# Patient Record
Sex: Male | Born: 1970 | Race: White | Hispanic: No | Smoking: Current every day smoker
Health system: Southern US, Community
[De-identification: ages and names within clinical notes are randomized; demographics above are authoritative.]

## PROBLEM LIST (undated history)

## (undated) DIAGNOSIS — T783XXA Angioneurotic edema, initial encounter: Secondary | ICD-10-CM

## (undated) DIAGNOSIS — F419 Anxiety disorder, unspecified: Secondary | ICD-10-CM

## (undated) DIAGNOSIS — R519 Headache, unspecified: Secondary | ICD-10-CM

## (undated) DIAGNOSIS — A6920 Lyme disease, unspecified: Secondary | ICD-10-CM

## (undated) DIAGNOSIS — A770 Spotted fever due to Rickettsia rickettsii: Secondary | ICD-10-CM

## (undated) DIAGNOSIS — M542 Cervicalgia: Secondary | ICD-10-CM

## (undated) DIAGNOSIS — L509 Urticaria, unspecified: Secondary | ICD-10-CM

## (undated) DIAGNOSIS — R51 Headache: Secondary | ICD-10-CM

## (undated) DIAGNOSIS — G249 Dystonia, unspecified: Secondary | ICD-10-CM

## (undated) HISTORY — DX: Urticaria, unspecified: L50.9

## (undated) HISTORY — DX: Dystonia, unspecified: G24.9

## (undated) HISTORY — DX: Cervicalgia: M54.2

## (undated) HISTORY — PX: ADENOIDECTOMY: SUR15

## (undated) HISTORY — PX: HIP SURGERY: SHX245

## (undated) HISTORY — PX: TONSILLECTOMY: SUR1361

## (undated) HISTORY — DX: Angioneurotic edema, initial encounter: T78.3XXA

## (undated) HISTORY — PX: BACK SURGERY: SHX140

---

## 1998-05-30 DIAGNOSIS — M21371 Foot drop, right foot: Secondary | ICD-10-CM

## 1998-05-30 HISTORY — DX: Foot drop, right foot: M21.371

## 1998-07-16 ENCOUNTER — Emergency Department (HOSPITAL_COMMUNITY): Admission: EM | Admit: 1998-07-16 | Discharge: 1998-07-16 | Payer: Self-pay | Admitting: Emergency Medicine

## 1998-08-10 ENCOUNTER — Encounter: Payer: Self-pay | Admitting: *Deleted

## 1998-08-10 ENCOUNTER — Ambulatory Visit: Admission: RE | Admit: 1998-08-10 | Discharge: 1998-08-10 | Payer: Self-pay | Admitting: *Deleted

## 1998-08-13 ENCOUNTER — Encounter: Admission: RE | Admit: 1998-08-13 | Discharge: 1998-11-11 | Payer: Self-pay | Admitting: *Deleted

## 1998-08-21 ENCOUNTER — Ambulatory Visit (HOSPITAL_COMMUNITY): Admission: RE | Admit: 1998-08-21 | Discharge: 1998-08-21 | Payer: Self-pay | Admitting: *Deleted

## 1998-08-21 ENCOUNTER — Encounter: Payer: Self-pay | Admitting: *Deleted

## 1998-09-07 ENCOUNTER — Encounter: Admission: RE | Admit: 1998-09-07 | Discharge: 1998-12-06 | Payer: Self-pay

## 1998-09-10 ENCOUNTER — Encounter: Payer: Self-pay | Admitting: Neurological Surgery

## 1998-09-10 ENCOUNTER — Ambulatory Visit (HOSPITAL_COMMUNITY): Admission: RE | Admit: 1998-09-10 | Discharge: 1998-09-10 | Payer: Self-pay | Admitting: Neurological Surgery

## 1998-09-23 ENCOUNTER — Emergency Department (HOSPITAL_COMMUNITY): Admission: EM | Admit: 1998-09-23 | Discharge: 1998-09-23 | Payer: Self-pay | Admitting: Emergency Medicine

## 1998-09-23 ENCOUNTER — Encounter: Payer: Self-pay | Admitting: Emergency Medicine

## 1998-09-24 ENCOUNTER — Encounter: Payer: Self-pay | Admitting: Neurological Surgery

## 1998-09-24 ENCOUNTER — Ambulatory Visit (HOSPITAL_COMMUNITY): Admission: RE | Admit: 1998-09-24 | Discharge: 1998-09-24 | Payer: Self-pay | Admitting: Neurological Surgery

## 1998-11-05 ENCOUNTER — Emergency Department (HOSPITAL_COMMUNITY): Admission: EM | Admit: 1998-11-05 | Discharge: 1998-11-05 | Payer: Self-pay | Admitting: Emergency Medicine

## 1998-11-12 ENCOUNTER — Emergency Department (HOSPITAL_COMMUNITY): Admission: EM | Admit: 1998-11-12 | Discharge: 1998-11-12 | Payer: Self-pay | Admitting: Emergency Medicine

## 1998-12-14 ENCOUNTER — Ambulatory Visit (HOSPITAL_COMMUNITY): Admission: RE | Admit: 1998-12-14 | Discharge: 1998-12-14 | Payer: Self-pay | Admitting: Neurological Surgery

## 1998-12-14 ENCOUNTER — Encounter: Payer: Self-pay | Admitting: Gastroenterology

## 1999-01-26 ENCOUNTER — Encounter: Payer: Self-pay | Admitting: Anesthesiology

## 1999-01-26 ENCOUNTER — Encounter: Admission: RE | Admit: 1999-01-26 | Discharge: 1999-04-26 | Payer: Self-pay | Admitting: Anesthesiology

## 1999-02-11 ENCOUNTER — Encounter: Payer: Self-pay | Admitting: Anesthesiology

## 1999-03-03 ENCOUNTER — Encounter: Payer: Self-pay | Admitting: Anesthesiology

## 1999-03-12 ENCOUNTER — Emergency Department (HOSPITAL_COMMUNITY): Admission: EM | Admit: 1999-03-12 | Discharge: 1999-03-12 | Payer: Self-pay | Admitting: *Deleted

## 1999-03-23 ENCOUNTER — Encounter: Admission: RE | Admit: 1999-03-23 | Discharge: 1999-06-21 | Payer: Self-pay | Admitting: Anesthesiology

## 1999-08-25 ENCOUNTER — Ambulatory Visit (HOSPITAL_COMMUNITY): Admission: RE | Admit: 1999-08-25 | Discharge: 1999-08-25 | Payer: Self-pay

## 1999-10-13 ENCOUNTER — Encounter: Payer: Self-pay | Admitting: Neurosurgery

## 1999-10-13 ENCOUNTER — Ambulatory Visit (HOSPITAL_COMMUNITY): Admission: RE | Admit: 1999-10-13 | Discharge: 1999-10-13 | Payer: Self-pay | Admitting: Neurosurgery

## 1999-10-15 ENCOUNTER — Emergency Department (HOSPITAL_COMMUNITY): Admission: EM | Admit: 1999-10-15 | Discharge: 1999-10-15 | Payer: Self-pay | Admitting: Emergency Medicine

## 1999-10-26 ENCOUNTER — Encounter: Payer: Self-pay | Admitting: Emergency Medicine

## 1999-10-26 ENCOUNTER — Emergency Department (HOSPITAL_COMMUNITY): Admission: EM | Admit: 1999-10-26 | Discharge: 1999-10-26 | Payer: Self-pay | Admitting: Emergency Medicine

## 2000-02-09 ENCOUNTER — Inpatient Hospital Stay (HOSPITAL_COMMUNITY): Admission: EM | Admit: 2000-02-09 | Discharge: 2000-02-10 | Payer: Self-pay | Admitting: *Deleted

## 2000-04-12 ENCOUNTER — Encounter: Payer: Self-pay | Admitting: Specialist

## 2000-04-12 ENCOUNTER — Encounter (INDEPENDENT_AMBULATORY_CARE_PROVIDER_SITE_OTHER): Payer: Self-pay | Admitting: Specialist

## 2000-04-12 ENCOUNTER — Observation Stay (HOSPITAL_COMMUNITY): Admission: RE | Admit: 2000-04-12 | Discharge: 2000-04-12 | Payer: Self-pay | Admitting: Specialist

## 2005-03-20 ENCOUNTER — Emergency Department (HOSPITAL_COMMUNITY): Admission: EM | Admit: 2005-03-20 | Discharge: 2005-03-20 | Payer: Self-pay | Admitting: *Deleted

## 2006-02-10 ENCOUNTER — Emergency Department (HOSPITAL_COMMUNITY): Admission: EM | Admit: 2006-02-10 | Discharge: 2006-02-10 | Payer: Self-pay | Admitting: Emergency Medicine

## 2006-07-26 ENCOUNTER — Ambulatory Visit (HOSPITAL_COMMUNITY): Admission: RE | Admit: 2006-07-26 | Discharge: 2006-07-27 | Payer: Self-pay | Admitting: Specialist

## 2007-03-12 ENCOUNTER — Emergency Department (HOSPITAL_COMMUNITY): Admission: EM | Admit: 2007-03-12 | Discharge: 2007-03-12 | Payer: Self-pay | Admitting: Emergency Medicine

## 2007-07-09 ENCOUNTER — Encounter: Admission: RE | Admit: 2007-07-09 | Discharge: 2007-07-09 | Payer: Self-pay | Admitting: Specialist

## 2007-08-12 ENCOUNTER — Emergency Department (HOSPITAL_COMMUNITY): Admission: EM | Admit: 2007-08-12 | Discharge: 2007-08-12 | Payer: Self-pay | Admitting: Emergency Medicine

## 2007-12-19 ENCOUNTER — Ambulatory Visit: Payer: Self-pay | Admitting: *Deleted

## 2007-12-19 ENCOUNTER — Inpatient Hospital Stay (HOSPITAL_COMMUNITY): Admission: RE | Admit: 2007-12-19 | Discharge: 2007-12-21 | Payer: Self-pay | Admitting: Specialist

## 2007-12-20 ENCOUNTER — Encounter (INDEPENDENT_AMBULATORY_CARE_PROVIDER_SITE_OTHER): Payer: Self-pay | Admitting: Specialist

## 2009-01-01 ENCOUNTER — Emergency Department (HOSPITAL_COMMUNITY): Admission: EM | Admit: 2009-01-01 | Discharge: 2009-01-01 | Payer: Self-pay | Admitting: Emergency Medicine

## 2010-09-05 LAB — POCT I-STAT, CHEM 8
BUN: 9 mg/dL (ref 6–23)
Calcium, Ion: 1.15 mmol/L (ref 1.12–1.32)
Chloride: 108 mEq/L (ref 96–112)
Glucose, Bld: 99 mg/dL (ref 70–99)
Potassium: 3.8 mEq/L (ref 3.5–5.1)
Sodium: 140 mEq/L (ref 135–145)
TCO2: 23 mmol/L (ref 0–100)

## 2010-09-05 LAB — URINALYSIS, ROUTINE W REFLEX MICROSCOPIC
Glucose, UA: NEGATIVE mg/dL
Specific Gravity, Urine: >1.046 — ABNORMAL HIGH (ref 1.005–1.030)
Urobilinogen, UA: 0.2 mg/dL (ref 0.0–1.0)

## 2010-10-12 NOTE — Op Note (Signed)
NAMECHASTEN, BLAZE NO.:  [id number]   MEDICAL RECORD NO.:  [id number]          PATIENT TYPE:  INP   LOCATION:  5022                         FACILITY:  [facility]   PHYSICIAN:  Jene Every, M.D.    DATE OF BIRTH:  August 23, 1970   DATE OF PROCEDURE:  12/19/2007  DATE OF DISCHARGE:                               OPERATIVE REPORT   PREOPERATIVE DIAGNOSIS:  Degenerative disk disease, L5-S1.   POSTOPERATIVE DIAGNOSIS:  Degenerative disk disease, L5-S1.   PROCEDURE PERFORMED:  1. Anterior lumbar interbody fusion utilizing the TwinFix STALIF      device.  2. Diskectomy of L5-S1 with foraminotomy of L5, right.  3. Autologous and allograft bone graft.  The autologous was bone      marrow aspirate.  The allograft was Synthes beta-tricalcium      phosphate as well as DBX putty.  4. Stress radiographs obtained of the L5-S1 disk space.   ASSISTANT:  Roma Schanz, PA   APPROACH BY:  Balinda Quails, MD   HISTORY AND INDICATION:  A 40 year old with end-stage disk degeneration  at L5-S1 after 2 diskectomies.  He has some leg pain, but no recurrent  disk herniation, mainly had discogenic vein confirmed with diskography,  negative at L4-L5.  He was indicated for fusion.  Risk and benefits  discussed including bleeding, infection, damage to neurovascular  structures, DVT, PE, anesthetic complications, persistent leg pain,  nonunion, etc.   TECHNIQUE:  With the patient in supine position after induction of  adequate general anesthesia, 2 g of Kefzol, the abdominal region was  prepped and draped in usual sterile fashion.  C-arm was utilized to  demarcate an incision, which was transverse on the abdomen, slightly  below the L5-S1 disk space.  The approach was made by Dr. Liliane Bade.  Following the anterior approach and the placement of the retractors, the  L5-S1 disk space was identified and marked with an 18-gauge needle to  confirm the AP and lateral plane, and we marked the  midline.   A #10-blade was utilized to incise the annulus.  We then curetted the  disk from the end-plate and removed the disk from the disk space with  pituitary.  The endplates were then curetted preserving the endplates by  just removing the cartilage layer.  We used a self-retaining retractor  posterior to the point over the uncinate process, and we detached the  annulus utilizing a curve curette, both to the left and right of midline  to allow posterior distraction.  For pain on the right side, I used a 3-  mm Kerrison to remove some of the uncinate process on the right and  large foramen.   The wound was copiously irrigated.  We confirmed the posterior aspect  with x-ray.  Inspection revealed full removal of disk material and  distraction of the annulus under stress radiographs.  The disk space  expanded in a fair little fashion.  This was then measured and found  that an 8-degree x 30 x 13.5 height Synthes device would be best to  place.  We then used  the bone marrow aspirate mixed with the DBX putty  and the tricalcium phosphate for the bone graft.  The bone graft was  then placed within the device.  The device was then inserted in the AP  and lateral plane to be satisfactory.  We then fixed it with screws two  25-mm into the S1 vertebral body, two 20-mm into L5 after utilization of  an awl in the appropriate guide.  Excellent purchase was obtained.  The  screws were then flushed.  The AP and lateral plane was found to be  midline and appropriately placed and distracted.   Just prior to proceeding with a diskectomy, I performed the bone marrow  aspirate using a Jamshidi needle into the L5 cortex of the vertebral  body, tapped it in, aspirated 60 mL of bone marrow aspirate, and that  was used to mix with the remaining bone graft.  This was then irrigated  and plugged with bone wax.  There was no bleeding from this.   Dr. Madilyn Fireman returned and retractors were then removed.   Inspection  revealed no evidence of active bleeding or vessel damage with good  restoration of blood flow in the left lower extremity.  Care was taken  not to retract the general femoral nerve throughout the case.  When the  device was inserted and following the insertion of the screws, it was  stable to manipulation.  Wound was copiously irrigated once again.   All instrumentation was removed.  Abdominal x-ray taken.  No retained  hardware noted.  The closure was then performed by Dr. Madilyn Fireman.  The  sterile dressing was applied.  The patient was then awoken without  difficulty and transferred to the recovery room in satisfactory  condition.  The patient tolerated the procedure with no complications.      Jene Every, M.D.  Electronically Signed     JB/MEDQ  D:  12/19/2007  T:  12/20/2007  Job:  [id number]

## 2010-10-12 NOTE — Op Note (Signed)
NAMEABISAI, DEER NO.:  [id number]   MEDICAL RECORD NO.:  [id number]          PATIENT TYPE:  INP   LOCATION:  5022                         FACILITY:  [facility]   PHYSICIAN:  Balinda Quails, M.D.    DATE OF BIRTH:  January 13, 1971   DATE OF PROCEDURE:  12/19/2007  DATE OF DISCHARGE:                               OPERATIVE REPORT   SURGEON:  Balinda Quails, MD   CO-SURGEON:  Jene Every, MD   ANESTHETIC:  General endotracheal.   PREOPERATIVE DIAGNOSIS:  L5-S1 degenerative disk disease.   POSTOPERATIVE DIAGNOSIS:  L5-S1 degenerative disk disease.   PROCEDURE:  L5-S1 anterior lumbar interbody fusion (ALIF).   CLINICAL NOTE:  [name] is a 40 year old male with chronic back  pain and evidence of L5-S1 disk disease.  He is scheduled at this time  to undergo L5-S1 ALIF.   He was seen preoperatively.  Normal lower extremity pulses.  No history  of DVT or pulmonary embolus.  Operative procedure was explained to the  patient including potential risks.  Risks include but are not limited to  DVT, pulmonary embolus, infection, bleeding, transfusion, limb ischemia,  sexual dysfunction, or other major complication.   OPERATIVE PROCEDURE:  The patient was brought to the operating room in  stable condition.  He was placed in supine position.  General  endotracheal anesthesia induced.  Monitoring lines including central  venous catheter, arterial line, Foley catheter, and pulse oximetry  placed on the left leg.   With the abdomen prepped and draped in sterile fashion, a transverse  skin incision was made over L5-S1 projection in the left lower quadrant.  Dissection carried through subcutaneous tissue with electrocautery.  Left anterior rectus sheath was incised from midline to lateral margin  of rectus muscle.  Superior and inferior anterior rectus flaps were  created.  The rectus muscle mobilized and retracted.  The left  retroperitoneal plane entered, and the left  psoas muscle and  genitofemoral nerve were identified and preserved.   The left common and external iliac artery and the vein were retracted  laterally.  The L5-S1 disk was easily palpated.  Middle sacral vessels  ligated with clips and divided.  The presacral plane was developed in  front of L5-S1 disk with blunt dissection and the disk cleared from left  to right.  With full exposure of the disk, a Thompson-Brau retractor was  assembled.  Using reverse-lip Brau blades, the disk was exposed by  hooking the blades on the lateral margins of L5 and S1 bilaterally.   With the disk fully exposed, Dr. Shelle Iron then completed L5-S1 ALIF.   After completion of the ALIF, retractors were removed.  There was no  significant bleeding.  The sponge and instrument counts were correct.  A  KUB was obtained.   The anterior rectus sheath was then closed with running 0 Vicryl suture.  Deep subcutaneous layer was closed with running 2-0 Vicryl suture.  Superficial subcutaneous layer with running 3-0 Vicryl suture.  Skin  closed with 4-0 Monocryl.  Sterile dressing applied.   No apparent complications.  The  patient was transferred to recovery room  in stable condition.      Balinda Quails, M.D.  Electronically Signed     PGH/MEDQ  D:  12/19/2007  T:  12/20/2007  Job:  [id number]   cc:   Jene Every, M.D.

## 2010-10-12 NOTE — H&P (Signed)
NAMEJAIVEN, Chen NO.:  [id number]   MEDICAL RECORD NO.:  [id number]          PATIENT TYPE:  INP   LOCATION:  2899                         FACILITY:  [facility]   PHYSICIAN:  [name], M.D.    DATE OF BIRTH:  07-24-1970   DATE OF ADMISSION:  12/19/2007  DATE OF DISCHARGE:                              HISTORY & PHYSICAL   CHIEF COMPLAINT:  Low back top of right butt cheek and occasional  right lower extremity pain.   HISTORY:  [name] is a 40 year old gentleman who is well known to  our practice.  He first presented to our office in 2001 following a work  comp related injury.  In 2002, he underwent lumbar decompression,  microdiskectomy at L5-S1 on the right, and did well from that.  He was  raised and released at that time.  Unfortunately, in 2007, he presented  again with recurrent complaints following a new work comp injury.  MRI  study at that time revealed a recurrent disk herniation.  The patient  underwent a repeat lumbar decompression and microdiskectomy in February  2008.  Again, the patient and did well for some time following that  surgery, but unfortunately had persistent symptoms, developing more back  pain and radicular pain into the leg.  He underwent a CT diskogram,  which did show annular tear at L5-S1 with a broad disk protrusion.  Diskogram did show a positive concordant pain at L5-S1, and until this  point, due to the disabling nature of the patient's back pain and  intermittent leg pain that he would benefit from anterior lumbar fusion.  The risks and benefits of this were discussed with the patient.  He does  elect to proceed.  We discussed smoking cessation with the patient prior  to proceeding with surgery.  He is currently on Chantix.  The patient  does have loss of range of motion in the lumbar spine.  He does have  positive straight leg raise on the right greater than left.  He  continues to have persistent S1 nerve root weakness  on the right, which  is from his initial two operations.   MEDICAL HISTORY:  Significant for chronic back pain, history of  migraines, and asthma currently controlled.   CURRENT MEDICATIONS:  Include Norco 5/325 one-two p.o. q.4-6 p.r.n.  pain, Robaxin 500 mg 1 p.o. q.8 p.r.n. spasm, Neurontin 300 mg 1 p.o.  q.i.d., and Chantix 2 p.o. daily.   ALLERGIES:  SULFA and PAXIL.   PREVIOUS SURGERIES:  Include microdiskectomy L5-S1 2002 and 2007, right  SI fusion in 2001.   SOCIAL HISTORY:  The patient is divorced.  He had a previous history of  tobacco use, quit approximately for 2 weeks.  He uses Chantix.  He  denies any current alcohol consumption.  Family and girlfriend will be  caregivers following surgery.   FAMILY HISTORY:  Noncontributory.   REVIEW OF SYSTEMS:  GENERAL:  The patient denies any fever, chills,  night sweats, or bleeding tendencies.  CNS:  No blurred or double  vision, seizure, headache, or paralysis.  RESPIRATORY:  No shortness of  breath, productive cough, or hemoptysis.  CARDIOVASCULAR:  Chest pain  and history of orthopnea.  GU:  No dysuria, hematuria, or discharge.  GI:  No nausea, vomiting, diarrhea, constipation, melena, or bloody  stools.  MUSCULOSKELETAL:  Refer to HPI.   PHYSICAL EXAM:  VITAL SIGNS:  Weight is 215, height is 6 feet, pulse is  62, respiratory rate 10, and BP 110/88.  GENERAL:  This is a well-nourished gentleman sitting upright in mild  distress.  He does walk with antalgic gait.  He is using a MAFO on his  left foot.  HEENT:  Atraumatic and normocephalic.  Pupils equal, round, and reactive  to light.  EOMs intact.  NECK:  Supple with no lymphadenopathy.  CHEST:  Clear to auscultation bilaterally.  No rhonchi, wheezes, or  rales.  BREAST AND GU:  Not examined.  Refer to HPI.  HEART:  Regular rate and rhythm without murmurs, gallops, or rubs.  ABDOMEN:  Soft, nontender, and nondistended.  Bowel sounds x4.  SKIN:  No rashes or lesions  noted.  The patient has no previous exposure  to MRSA.  EXTREMITIES:  The patient has decreased range of motion in the lumbar  spine.  Positive straight leg raise on the right.  Pain with forward  flexion and extension of the lumbar spine, EHL weakness, S1 weakness, as  well as decreased sensation.   IMPRESSION:  Degenerative disk disease, L5-S1.   PLAN:  The patient was admitted to undergo anterior lumbar fusion at L5-  S1.      [name], P.A.      [name], M.D.  Electronically Signed    CS/MEDQ  D:  12/18/2007  T:  12/19/2007  Job:  [id number]

## 2010-10-15 NOTE — Discharge Summary (Signed)
NAMECAYDON, [name] NO.:  [id number]   MEDICAL RECORD NO.:  [id number]          PATIENT TYPE:  INP   LOCATION:  5022                         FACILITY:  [facility]   PHYSICIAN:  Jene Every, M.D.    DATE OF BIRTH:  10/03/1970   DATE OF ADMISSION:  12/19/2007  DATE OF DISCHARGE:  12/21/2007                               DISCHARGE SUMMARY   ADMISSION DIAGNOSES:  1. Degenerative disk disease, L5-S1.  2. History of migraines.  3. Asthma.   DISCHARGE DIAGNOSES:  1. Degenerative disk disease, L5-S1.  2. History of migraines.  3. Asthma.  4. Status post anterior lumbar fusion, L5-S1.   HISTORY:  [name] is a 36-year gentleman who is well known to our  practice.  He first presented to our office in 2001 following a work  comp related injury.  In 2002, he underwent lumbar decompression  microdiskectomy at L5-S1 on the right.  He did well from that.  At that  time, he was rated and released.  Unfortunately in 2007, he presented  with recurrent complaints from a new injury.  He underwent repeat  decompression, but unfortunately had short-term symptom relief from  this.  He developed persistent low back pain.  CT diskogram showed an  annular tear at L5-S1 with concordant pain at L5-S1 as well.  Until this  point due to disabling nature of the patient's pain, he would benefit  from anterior lumbar fusion.  The risks and benefits of this were  discussed.  He does wish to proceed.   PROCEDURE:  The patient was taken to the OR on December 19, 2007 to undergo  anterior lumbar fusion at L5-S1.  Probe surgeon was Dr. Liliane Bade.  Primary surgeon was Dr. Jene Every.  Assistant, Roma Schanz, PA-  C.   ANESTHESIA:  General.   COMPLICATIONS:  None.   CONSULTS:  PT/OT, Care Management.   Postoperative labs were obtained which showed a white cell count of 9.3,  hemoglobin of 14.8, hematocrit of 42.3.  Routine coagulation studies  done preoperatively showed a PT of  12.3, INR of 0.9, and PTT of 34.  Routine chemistries done preoperatively were within normal range, normal  BUN and creatinine.  Preoperative urinalysis was negative.  Blood type  is A positive.  Preoperative chest x-ray showed mild bronchitic changes  without acute abnormality.  Postoperative chest x-ray showed good  placement of the central line.  Preop films showed straightening of  normal lordosis with loss of displaced height at L5-S1.  Postoperative  lumbar films showed status post anterior lumbar fusion at L5-S1.   HOSPITAL COURSE:  The patient was admitted, taken to the OR, and  underwent the above-stated procedure without difficulties and  transferred to the PACU and then to the orthopedic floor for continued  postoperative care.  He was initially placed on PC analgesics.  This was  discontinued after postop day #1.  The patient was doing fairly well.  He is up walking and voiding without difficulty.  He noted decrease in  lower extremity pain, does have abdominal soreness as expected.  No  repeat labs were obtained.  Doppler was obtained postoperatively which  was negative for DVT.  The patient was given one dose of Lovenox.  Deep  pedal pulses were equal.  It was felt on postop day #2, the patient was  stable to be discharged home.   DISPOSITION:  The patient discharged home with home health needs.  He is  to follow with Dr. Shelle Iron in approximately 14 days for suture removal and  x-ray.  His activity ambulate as tolerated.  Use proper back  precautions.  Utilize Bohler brace.   DISCHARGE MEDICATIONS:  1. Percocet 1-2 p.o. q.4-6 h. p.r.n. pain.  2. Robaxin 500 mg 1 p.o. q.8 h. p.r.n. spasm.  3. Vitamin C.  4. Over-the-counter stool softener.   Diet is high fiber.   CONDITION ON DISCHARGE:  Stable.   FINAL DIAGNOSIS:  Doing well status post anterior lumbar fusion, L5-S1.      Roma Schanz, P.A.      Jene Every, M.D.  Electronically Signed    CS/MEDQ   D:  01/29/2008  T:  01/30/2008  Job:  [id number]

## 2010-10-15 NOTE — H&P (Signed)
[facility]  Patient:    VANG, KRAEGER                      MRN: [id number] Adm. Date:  [id number] Disc. Date: [id number] Attending:  Denny Peon Dictator:   Valinda Hoar, N.P.                         History and Physical  IDENTIFYING INFORMATION:  Mr. Premo is a 40 year old white single male who was admitted on a voluntary basis February 09, 2000 for detox off OxyContin and Neurontin.  HISTORY OF PRESENT ILLNESS:  The patient reports that his neurosurgeon, Dr. Sheppard Penton at [facility], wants the patient to be off Neurontin and OxyContin prior to having surgery at [facility] on his back.  Dr. Sheppard Penton wants him off OxyContin and Neurontin entirely prior to doing back surgery.  The patient is not really sure why he is here.  He states in the past he has gotten off OxyContin with no problems.  He states that his primary care doctor, Dr Arvilla Market has already called in a prescription for Darvocet and is willing The patient prescribe a non-narcotic pain medication in place of the OxyContin.  He state that Dr. Arvilla Market wants him detoxed off the OxyContin.  Apparently he is taking OxyContin 20 mg twice a day for the past eight onths.  He reports the prescription that was originally written said he could take 20 mg up to four times a day and he only needs it twice a day.  The patient states he last took OxyContin on February 08, 2000.  He reported to me thsat he took himself off OxyContin in July 2000 after his accident and he was able to detox himself without any problems and he states he prefers to do it this way and also will be followed with Dr. Arvilla Market with Darvocet for the pain.  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  Sleep has been poor for about six months, averaging 3-4 hours  night which he attributes to his pain and sometimes muscle spasms.  Again, he states his depression is related to chronic pain.  His appetite was good  until one week ago when his medications were decreased.  He states he is having no signs and symptoms of withdrawal. He states the only symptom he has is sweating He reports when he came on the unit last night he was upset.  He states the only reason he is here at the hospital is because of his father who insisted that he come into the hospital. He describes his father as a Chief Executive Officer."  The patient states he wants to go home.  He states his father took his last few OxyContin and his Neurontin and that his preference is to be followed by Dr. Arvilla Market who will detox him off the OxyContin and replace it with a non-narcotic.  He states the fact that Dr. Arvilla Market has a prescription waiting for him and when he leaves here he is going to Dr. Fonnie Birkenhead office to pick up the Darvocet and he has no intention of getting on OxyContin again.  Again, he said he would be more comfortable at home.  He does not feel like he needs to be here and he denies suicidal ideation, denies homicidal ideation.  He denies any signs or symptoms of withdrawal.  There is no evidence of psychosis.  The patient is not a  danger to self or others.  PAST PSYCHIATRIC HISTORY:  None.  PAST MEDICAL HISTORY:  The patients primary care Doctor is Dr. Arvilla Market at [facility] and he saw him yesterday February 09, 2000.  His neurosurgeon is Dr. Elesa Hacker.  He last saw him February 07, 2000.  He also has a Midwife at [facility], Dr. Sheppard Penton.  Medical problems:  He did have a seizure disorder when he was a baby and he states he had a seizure after taking Paxil.  He reports he has a herniated L5, a bulging L4 and sacroiliac join torn at the top.  This occurred in February 2000 due to a work-related injury, according to the patient.  CURRENT MEDICATIONS:  OxyContin 20 mg which he takes at 10 a.m. and 5 p.m.  He had been on this for eight months, however, he stopped the OxyContin February 08, 2000.  He denies  that he has ever abuse OxyContin.  Neurontin 600 mg t.i.d.  The patient stopped the Neurontin Monday, February 07, 2000.  He continues to take Flexeril 700 1 tab t.i.d.  He has not had any Flexeril since February 09, 2000.  DRUG ALLERGIES:  PAXIL causes seizures, SULFA causes anaphylactic shock.  SOCIAL HISTORY:  The patient lives with his fiance and a roommate here n [place].  His parents are living here in [place].  They both work a lot in [place].  He has one brother and two sisters.  He completed two years of college.  He has been going with his fiance for 11 years.  He currently is unemployed.  He states he was injured on the job in February 2000 and he was fired by [employer].  His parents help him financially.  He acknowledges being involved in a lawsuit since July 2000 towards the company where he injured himself.  FAMILY HISTORY:  HIs brother has a past history of marijuana abuse.  ALCOHOL AND DRUG HISTORY:  The patient states that he does not use alcohol. He says he has not abused OxyContin, he has only taken it as prescribed and states in fact, he takes less than is prescribed.  POSITIVE PHYSICAL FINDINGS:  Please see physical examination done at [facility] ED on February 09, 2000.  Temperature 97.8,. pulse 95, respirations 28, blood pressure 142/89.  LABORATORY DATA:  His CMET is within normal limits with the exception of potassium of 3.4 on September 12th and 4.3 on February 10, 2000.  His CBC is within normal limits.  His urine drug screen was negative.  His alcohol level was less than 10.  CURRENT MENTAL STATUS EXAMINATION:  Appearance:  A casually dressed young Caucasian male who is rather thin but is cooperative and pleasant.  Speech is normal tone and is very relevant.  Mood is slightly anxious, which he relates to being here on this unit.  Affect is anxious.  He denies any suicidal ideation or intent, denies any homicidal ideation or intent.   Thought processes are logical and coherent without evidence of psychosis.  No hallucinations, no delusions.  Cognitive:  He is alert and oriented.  His  cognitive functioning is intact.  His insight appears to be poor.  He is not having any signs or symptoms of withdrawal from OxyContin.  CURRENT DIAGNOSES: Axis I:    OxyContin dependence. Axis II:   Deferred. Axis III:  1. Herniated L5, bulging L4 and sacroiliac joint tear with chronic  pain related to above.            2. History of seizures. Axis IV:   Severe, related to occupational problems, economic problems and            medical problems. Axis V:    Current global assessment of functioning 55, highest in past year            58.  CURRENT TREATMENT PLAN AND RECOMMENDATIONS:  Voluntary admission to [facility] Unit.  Will check him every 15 minutes and maintain safety. We did start a procedure to detox him off of his OxyContin using the Wytensin protocol and also started tapering his Neurontin and Flexeril.  When we saw the patient this morning, he is requesting to be discharged and wants to sign out against medical advice.  He states that he is not going into withdrawal. He denies depression.  He denies any suicidal ideation or intent.  He denies any homicidal ideation or intent.  He states that Dr. Arvilla Market, his primary care doctor is going to treat him with a non-narcotic, i.e., Darvocet and take him off the OxyContin.  He states he does not need to be in the hospital.  The only reason is he is here is because he feels he was forced by his father.  He said Dr. Arvilla Market is expecting himn in his office this morning.  The patient states he has been taking OxyContin only twice a day, he last took it September 11,2001.  He states he has stopped the OxyContin on his own in the past July 2000 and he was taking 60 mg a day at that time.  He states he wants to leave the unit.  He sees no reason why he  needs to be here and again, his last OxyContin was September 121, 2001 with no signs or symptoms of witdrawal He did have some sweating last night.  The patient states he is not dangerous to self or other and prefers to follow-up for treatment with his own physician.  TENTATIVE LENGTH OF STAY AND DISCHARGE PLAN:  One day. DD:  02/10/00 TD:  02/10/00 Job: [id number]

## 2010-10-15 NOTE — Discharge Summary (Signed)
[facility]  Patient:    [name], [name]                      MRN: [id number] Adm. Date:  [id number] Disc. Date: [id number] Attending:  Denny Peon Dictator:   Johnella Moloney, N.P. CC:         Desma Maxim, M.D.   Discharge Summary  HISTORY OF PRESENT ILLNESS:  Mr. [name] is a 40 year old white single male admitted on a voluntary basis for detox from OxyContin and Neurontin.  Patient reported that he is here only because his father seemed to force him to come here.  He states that he is supposedly supposed to be detoxed off OxyContin and Neurontin prior to having surgery at [facility] by Dr. Sheppard Penton.  Dr. Sheppard Penton apparently wants him off OxyContin and Neurontin prior to the surgery. Patient states he is taking OxyContin 20 mg b.i.d.  He last used OxyContin February 08, 2000.  He reports that he sees no need to be in here, that his primary care doctor, Dr. Arvilla Market, stated that he would treat him with a non-narcotic, i.e., Darvocet, while he was coming off the OxyContin.  The patient wants to be followed by his primary care doctor to assist with the detox off the OxyContin.  Patient reports that he got off the OxyContin on his own in July 2000.  He was taking 60 mg a day and did not go into withdrawal. Patient does report that if he has any depression, it is related to the chronic pain.  He strongly and adamantly denies any suicidal or homicidal ideation.  He denies any homicidal or suicidal intent.  He has no evidence of any psychosis.  There are no signs or symptoms of withdrawal other than some sweating late last night.  He is upset about being on the unit.  He feels like his medical doctor has already told him that he would treat him with a non-narcotic, in fact, he says Dr. Arvilla Market is waiting to see him this morning and is going to give him Darvocet while he stops the OxyContin.  Patient feels like he would be more comfortable at home  and feels that he does not need to be here.  PAST MEDICAL/PSYCHIATRIC HISTORY:  The patient has had no previous psychiatric treatment, including inpatient or outpatient.  Patients primary care doctor is Dr. Arvilla Market with [facility].  He last saw him on February 09, 2000.  He also has a neurosurgeon here in [place], Dr. Elesa Hacker, who he last saw February 07, 2000, and he also has another Midwife at [facility], Dr. Sheppard Penton.  Patient states his medical problems include a seizure disorder when he was a baby and also a seizure after taking Paxil.  He has a herniated L5, a bulging L4, and a sacral iliac joint that is torn upper posterior thorax.  He states this occurred in February 2000 related to a work injury.  ADMISSION MEDICATIONS:  Prior to admission patient was on OxyContin 20 mg that he took at 10 a.m. and 5 p.m.  He has been on this for eight months; however, he stopped it February 08, 2000.  He also is on Neurontin 600 mg t.i.d.  He stopped this Monday, February 07, 2000.  He was on Flexeril 700 mg one tablet t.i.d.  He stopped taking that on February 09, 2000.  ALLERGIES:  He reported being allergic to PAXIL which gave him a seizure, SULFA, which  put him into antiphylactic shock.  PHYSICAL EXAMINATION:  Please see physical exam done at [facility] Emergency Department on February 09, 2000.  MENTAL STATUS EXAMINATION:  Patient is a casually dressed young Caucasian male who is somewhat thin, but cooperative and pleasant.  His speech is normal tone and relevant mood.  Affect slightly anxious.  He denies suicidal ideation or intent or plan.  Thought processes are logical and coherent without evidence of psychosis.  No hallucinations, no delusions.  Cognitive:  He is alert and oriented.  His cognitive functioning is intact.  His insight appears to be poor.  He is having no signs and symptoms of withdrawal, other than some sweating that he had last  night.  ADMITTING DIAGNOSES: Axis I:    OxyContin dependence. Axis II:   Deferred. Axis III:  1. Herniated L5, bulging L4, and sacral iliac joint tear.            2. Chronic pain related to this.            3. History of seizures. Axis IV:   Severe, related to occupational problems, economic problems, and            medical problems. Axis V:    Current global assessment of functioning 55, highest in the past            year is 65.  LABORATORY DATA:  On September 13, blood alcohol level was within normal limits.  His urine drug screen was negative.  His CMET was within normal limits with the exception of his bicarbonate which was elevated at 25.5.  His CBC was within normal limits.  There was another CMET done on September 13 and it showed a potassium at 3.4 which is low; however, another CMET that was done was within normal limits and it was 4.3.  HOSPITAL COURSE:  The patient was admitted on February 09, 2000, for detox off OxyContin and Neurontin.  He was placed on a Wytensin protocol for detox from OxyContin.  He was also placed on Neurontin taper as well as a Robaxin taper and also given Librium 25 mg as necessary for any withdrawal symptoms. Patient had no withdrawal symptoms.  He had no psychotic behavior, no signs and symptoms of withdrawal.  Patient states he did not want to be here at the hospital.  He was not considered to be dangerous to self or others.  He was not in withdrawal and was having no signs and symptoms.  It was felt that again he was safe and not in any imminent danger of any withdrawal.  His cognitive functioning was intact.  The patient did not want to remain at the hospital.  He wanted to be followed up by his primary care doctor, Dr. Arvilla Market, who he said was expecting to see him today.  He reported that Dr. Arvilla Market was willing to take him off the OxyContin and give him a non-narcotic to help with his pain and that is what the patient preferred to do.   Since the patient showed no signs and symptoms of withdrawal, no evidence  of psychosis, no evidence of any type of dangerousness, and his cognitive functioning was intact, he was alert and oriented, it was decided that he could be discharged but against medical advice.  Patient was informed that he would be signing out AMA, and we also informed him that withdrawal from OxyContin without appropriate medical treatment could be life threatening and possibly fatal.  Patient stated that  he understood this and he knew this and social worker was also in the room when we talked to him about this.  He was advised that he was to see Dr. Arvilla Market immediately after discharge today and he was agreeable with this.  CONDITION ON DISCHARGE:  The patient is discharged really with no change in behavior on admission.  There is no change in his mood, sleep, appetite.  He has no suicidal or homicidal ideation, no signs and symptoms of withdrawal. The only thing that he complains of is pain and he prefers to be followed by his primary care doctor, Dr. Arvilla Market, who patient states is willing to treat him on an outpatient basis.  DISPOSITION:  The patient is discharged to home.  FOLLOW-UP:  Patient is to follow up with Dr. Arvilla Market today, February 10, 2000, as soon as he leaves the hospital.  We again clearly told him that we were not going to prescribe any medication so that he could renew his old medications as prescribed.  If Dr. Arvilla Market wanted to have him remain on this medication, that would be up to Dr. Arvilla Market.  DISCHARGE MEDICATIONS:  None from the psychiatric unit.  FINAL DIAGNOSES: Axis I:    OxyContin dependence. Axis II:   Deferred. Axis III:  1. Herniated L5, bulging L4.            2. Sacral iliac joint torn, posterior thorax.            3. Chronic pain related to the above.            4. History of seizures. Axis IV:   Severe, related to occupational problems, economic problems,             medical problems. Axis V:    Current global assessment of functioning 55, highest in the past            year was 65. DD:  02/10/00 TD:  02/10/00 Job: [id number]

## 2010-10-15 NOTE — Op Note (Signed)
[facility]  Patient:    HENSLEY, TREAT                      MRN: [id number] Proc. Date: 06/05/00 Adm. Date:  [id number] Disc. Date: [id number] Attending:  Pierce Crane                           Operative Report  This is a redictation.  PREOPERATIVE DIAGNOSES:  Herniated nucleus pulposus L5-S1, right.  POSTOPERATIVE DIAGNOSES:  Herniated nucleus pulposus L5-S1, right.  PROCEDURE:  Microdiskectomy L5-S1, right.  ANESTHESIA:  General.  ASSISTANT:  Dr. Montez Morita.  BRIEF HISTORY:  This is a 40 year old with refractory S1 radicular pain secondary to herniated nucleus pulposus at L5-S1 confirmed with MRI and positive straight leg raise, diminished sensation, S1 dermatome, diminished Achilles. Operative intervention was indicated for decompressing the S1 nerve root. The risks and benefits of surgery were discussed including bleeding, infection, damage to neurovascular structures, CSF leakage, epidural fibrosis, need for fusion in the future, etc.  TECHNIQUE:  The patient in the supine position after the induction of adequate general anesthesia and 1 gm of Kefzol, the patient was placed prone on the [place] frame, all bony prominences were well padded. The lumbar region was prepped and draped in the usual sterile fashion. Two 18 gauge spinal needles were utilized to localize the L5-S1 interspace confirmed with x-ray. Next, an incision was made from the spinous process of 5-1, subcutaneous tissue was dissected, electrocautery was utilized to achieve hemostasis. The dorsal lumbar fascia identified and divided in line with the skin incision. The paraspinous muscles elevated from the lamina of 5 and S1, McCullough retractors placed, operating microscope was draped and brought onto the surgical field. Hemilaminotomy at L5 was performed with a 2 mm and a 3 mm Kerrison. Ligamentum flavum removed from the interspace. This was detached from the cephalad  edge of S1. The nerve root of S1 was identified and foramen of S1 gently mobilized medially. A large HNP was noted pressing the S1 nerve root. It was erythematous and edematous. Bipolar electrocautery was utilized to achieve hemostasis. Annulotomy was performed with a 15 blade and a copious portion of disk material was removed from the interspace and a small fragment extruded. It was further mobilized with a curette and removed with pituitaries. Following the diskectomy, there was no further residual disk material herniated. Checked beneath the thecal sac, the axilla of the nerve root down in the foramen of 5 and S1 as well as cephalad. There was no residual herniated disk material noted. The nerve root was found to be free and fully mobile. The disk space was copiously irrigated with antibiotic irrigation. Inspection revealed no evidence of CSF leakage of active bleeding confirmed until a radiograph had been obtained confirming the disk space. Next, the [facility] retractor was removed, paraspinous muscles inspected with no evidence of actively bleeding. Thrombin soaked Gelfoam had been placed in the laminotomy defect. The dorsal lumbar fascia reapproximated with #1 Vicryl interrupted figure-of-eight sutures. The subcutaneous tissue reapproximated with 2-0 Vicryl simple sutures. The skin was reapproximated with 4-0 subcuticular PDS. The wound was reinforced with Steri-Strips, sterile dressing applied. The patient supine in the hospital bed, extubated without difficulty and transported to the recovery room in satisfactory condition.  The patient tolerated the procedure well. No complications. DD:  07/10/00 TD:  07/10/00 Job: [id number]

## 2010-10-15 NOTE — Op Note (Signed)
NAMENICANDRO, PERRAULT               ACCOUNT NO.:  [id number]   MEDICAL RECORD NO.:  [id number]          PATIENT TYPE:  AMB   LOCATION:  DAY                          FACILITY:  [facility]   PHYSICIAN:  Jene Every, M.D.    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   POSTOPERATIVE DIAGNOSIS:  Recurrent herniated nucleus pulposus, spinal  stenosis at L5-S1, right.   OPERATIONS:  Re-do decompression L5-S1, foraminotomy of L5 and S1.   SURGEON:  Jene Every, M.D.   ANESTHESIA:  General.   ASSISTANT:  Roma Schanz, PA.   INDICATIONS FOR PROCEDURE:  This is a 40 year old male who sustained a  recurrent disk herniation L5-S1 with compression of the S1 nerve root  confirmed by MRI.  The patient had severe S1 radiculopathy, positive  neural tension sign, mild terminal weakness, dermatomal dysesthesias.  He was indicated for decompression of the L5-S1 nerve root.  Risks and  benefits discussed including bleeding, infection, damage to vascular  structures, CSF leakage, epidural fibrosis, adjacent segment disease,  need for fusion in the future, anesthetic complications, etcetera.   DESCRIPTION OF PROCEDURE:  With the patient in the supine position after  the induction of adequate general anesthesia, 2 grams of Kefzol, he was  placed prone on the [place] frame.  All bony prominences were well  padded.  Lumbar region was prepped and draped in the usual sterile  fashion.  Previous surgical scar was excised.  Subcutaneous tissue was  dissected with electrocautery utilized to achieve hemostasis.  Dorsal  lumbar fascia was identified and divided in line with the skin incision.  Paraspinal muscle elevated from lamina 5 and 1.  McCullough retractor  was placed.  Operative microscope was draped and brought in  the surgical  field.  X-ray confirmed the 5 and 1 space.  The lamina of 5 and S1 and  the facet was skeletonized.  Epidural fibrosis was mobilized from the  cephalad edge of S1 and the inferior aspect of 5 with a straight  curette.  The hemilaminotomy was enlarged at 5 with a 2 mm Kerrison and  then at S1.  A generous S1 foraminotomy was performed to identify the S1  nerve root.  There was severe compression on the S1 nerve root in the  lateral recess.  We decompressed the facet and the medial border of the  pedicle.  We gently mobilized the __________ medially and a focal HNP  was noted.  The free fragment was mobilized with a nerve hook and then a  micropituitary.  Three fragments were removed from beneath the root.  We  gently mobilized the root, protected it with the __________ and used an  Epstein to mobilize further disk herniation and epidural fibrosis to  mobilize the S1 nerve root.  Straight pituitary removed three additional  fragments.  We removed disk material from the disk space.  Bipolar  electrocautery was utilized to achieve hemostasis.  After  lysis of  adhesions and the diskectomy, we had good excursion of the S1 nerve  root, good mobility at least 1 cm to the medial border of the pedicle  without difficulty.  I copiously irrigated the disk space with  antibiotic irrigation.  We had placed hockey stick probe out the foramen  of 5 and cephalad without any compression noted.  Examined beneath the  thecal sac, the axilla of the root, the shoulder of the root out in the  foramen of S1 and 5.  There was no residual disk herniation or  compression upon the S1 nerve root.  It was noted to be erythematous and  edematous.  There was an osteophytic spur off the posterior aspect of 5.  Again after copious irrigation with antibiotic irrigation, 1 cc of  fentanyl was placed in the interlaminar space.  McCullough retractor was  removed.  Paraspinous muscle inspected.  No evidence of  active bleeding.  Dorsal lumbar fascia reapproximated with 0 Vicryl simple sutures,  subcutaneous tissue with 2-0 Vicryl simple sutures and skin 4-0  subcuticular Prolene, wound reinforced with Steri-Strips.  Sterile  dressing applied.  Placed supine on a hospital bed and extubated without  difficulty.  Transported to the recovery room in satisfactory condition.   The patient tolerated the procedure well.  No complications.      Jene Every, M.D.  Electronically Signed     JB/MEDQ  D:  07/26/2006  T:  07/26/2006  Job:  [id number]

## 2011-02-21 LAB — RAPID STREP SCREEN (MED CTR MEBANE ONLY): Streptococcus, Group A Screen (Direct): NEGATIVE

## 2011-02-25 LAB — COMPREHENSIVE METABOLIC PANEL
ALT: 25
Alkaline Phosphatase: 58
BUN: 12
CO2: 27
GFR calc Af Amer: >60
GFR calc non Af Amer: >60
Sodium: 139
Total Bilirubin: 0.6
Total Protein: 6.7

## 2011-02-25 LAB — CBC
HCT: 42.3
Hemoglobin: 14.8

## 2011-02-25 LAB — ABO/RH: ABO/RH(D): A POS

## 2011-02-25 LAB — URINALYSIS, ROUTINE W REFLEX MICROSCOPIC
Hgb urine dipstick: NEGATIVE
Ketones, ur: NEGATIVE
Urobilinogen, UA: 0.2

## 2011-02-25 LAB — APTT: aPTT: 34

## 2011-02-25 LAB — DIFFERENTIAL
Basophils Relative: 1
Eosinophils Relative: 1
Lymphocytes Relative: 38
Monocytes Absolute: 0.5
Monocytes Relative: 6

## 2011-02-25 LAB — PROTIME-INR: Prothrombin Time: 12.3

## 2011-02-25 LAB — TYPE AND SCREEN: Antibody Screen: NEGATIVE

## 2013-07-17 ENCOUNTER — Encounter (HOSPITAL_COMMUNITY): Payer: Self-pay | Admitting: Emergency Medicine

## 2013-07-17 ENCOUNTER — Emergency Department (INDEPENDENT_AMBULATORY_CARE_PROVIDER_SITE_OTHER): Payer: Managed Care, Other (non HMO)

## 2013-07-17 ENCOUNTER — Emergency Department (INDEPENDENT_AMBULATORY_CARE_PROVIDER_SITE_OTHER)
Admission: EM | Admit: 2013-07-17 | Discharge: 2013-07-17 | Disposition: A | Discharge status: Home / Self Care | Payer: Managed Care, Other (non HMO) | Source: Home / Self Care | Attending: Emergency Medicine | Admitting: Emergency Medicine

## 2013-07-17 DIAGNOSIS — J209 Acute bronchitis, unspecified: Secondary | ICD-10-CM

## 2013-07-17 MED ORDER — ALBUTEROL SULFATE HFA 108 (90 BASE) MCG/ACT IN AERS: 2.0000 | INHALATION_SPRAY | Freq: Four times a day (QID) | RESPIRATORY_TRACT | Status: DC

## 2013-07-17 MED ORDER — HYDROCOD POLST-CHLORPHEN POLST 10-8 MG/5ML PO LQCR: 5.0000 mL | Freq: Two times a day (BID) | ORAL | Status: DC | PRN

## 2013-07-17 MED ORDER — PREDNISONE 20 MG PO TABS: 20.0000 mg | ORAL_TABLET | Freq: Two times a day (BID) | ORAL | Status: DC

## 2013-07-17 MED ORDER — DOXYCYCLINE HYCLATE 100 MG PO TABS: 100.0000 mg | ORAL_TABLET | Freq: Two times a day (BID) | ORAL | Status: DC

## 2013-07-17 NOTE — ED Notes (Signed)
Smokes 1/2 pack cigarettes per day; cough for 1 week; c/o cough, chest tightness; NAD

## 2013-07-17 NOTE — ED Notes (Signed)
Asked by front staff to assess pt for cold sxs onset 9 days Sxs include: fevers, cough, chest pain that increases w/cough, SOB Smokes 0.5 PPD Denies v/n/d Pt is alert and talking in complete sentences w/no signs of acute distress Adv pt to notify front if sxs change or worsen.

## 2013-07-17 NOTE — ED Provider Notes (Signed)
Chief Complaint   Chief Complaint  Patient presents with  . Cough    History of Present Illness   [name] is a 43 year old male has had a ten-day history of cough productive yellow sputum, wheezing, chest tightness, chest pain, fever of up to 102.4, chills, sore throat, nasal congestion, rhinorrhea, headache, sinus pressure, left ear pain.  Review of Systems   Other than as noted above, the patient denies any of the following symptoms: Systemic:  No fevers, chills, sweats, or myalgias. Eye:  No redness or discharge. ENT:  No ear pain, headache, nasal congestion, drainage, sinus pressure, or sore throat. Neck:  No neck pain, stiffness, or swollen glands. Lungs:  No cough, sputum production, hemoptysis, wheezing, chest tightness, shortness of breath or chest pain. GI:  No abdominal pain, nausea, vomiting or diarrhea.  PMFSH   Past medical history, family history, social history, meds, and allergies were reviewed. He's allergic to sulfa. He smokes a half pack of cigarettes a day.  Physical exam   Vital signs:  BP 122/76  Pulse 91  Temp(Src) 99.5 F (37.5 C) (Oral)  Resp 25  SpO2 100% General:  Alert and oriented.  In no distress.  Skin warm and dry. Eye:  No conjunctival injection or drainage. Lids were normal. ENT:  TMs and canals were normal, without erythema or inflammation.  Nasal mucosa was clear and uncongested, without drainage.  Mucous membranes were moist.  Pharynx was clear with no exudate or drainage.  There were no oral ulcerations or lesions. Neck:  Supple, no adenopathy, tenderness or mass. Lungs:  No respiratory distress.  Lungs were clear to auscultation, without wheezes, rales or rhonchi.  Breath sounds were clear and equal bilaterally.  Heart:  Regular rhythm, without gallops, murmers or rubs. Skin:  Clear, warm, and dry, without rash or lesions.   Radiology   Dg Chest 2 View  07/17/2013   CLINICAL DATA:  Productive cough and fever  EXAM: CHEST  2  VIEW  COMPARISON:  January 01, 2009  FINDINGS: Lungs are clear. Heart size and pulmonary vascularity are normal. No adenopathy. No bone lesions.  IMPRESSION: No abnormality noted.   Electronically Signed   By: Bretta Bang M.D.   On: 07/17/2013 14:35   Assessment     The encounter diagnosis was Acute bronchitis.  No evidence of pneumonia.  Plan    1.  Meds:  The following meds were prescribed:   Discharge Medication List as of 07/17/2013  3:04 PM    START taking these medications   Details  albuterol (PROVENTIL HFA;VENTOLIN HFA) 108 (90 BASE) MCG/ACT inhaler Inhale 2 puffs into the lungs 4 (four) times daily., Starting 07/17/2013, Until Discontinued, Normal    chlorpheniramine-HYDROcodone (TUSSIONEX) 10-8 MG/5ML LQCR Take 5 mLs by mouth every 12 (twelve) hours as needed for cough., Starting 07/17/2013, Until Discontinued, Normal    doxycycline (VIBRA-TABS) 100 MG tablet Take 1 tablet (100 mg total) by mouth 2 (two) times daily., Starting 07/17/2013, Until Discontinued, Normal    predniSONE (DELTASONE) 20 MG tablet Take 1 tablet (20 mg total) by mouth 2 (two) times daily., Starting 07/17/2013, Until Discontinued, Normal        2.  Patient Education/Counseling:  The patient was given appropriate handouts, self care instructions, and instructed in symptomatic relief.  Instructed to get extra fluids, rest, and use a cool mist vaporizer.  He was strongly encouraged to quit smoking.  3.  Follow up:  The patient was told to follow up  here if no better in 3 to 4 days, or sooner if becoming worse in any way, and given some red flag symptoms such as increasing fever, difficulty breathing, chest pain, or persistent vomiting which would prompt immediate return.  Follow up here as needed.      Reuben Likesavid C Kaleeyah Cuffie, MD 07/17/13 [phone]

## 2013-07-17 NOTE — ED Notes (Deleted)
States he has had a fatty cyst on his back for many years, and it has recently become sore and draining .

## 2013-07-17 NOTE — Discharge Instructions (Signed)

## 2013-12-03 ENCOUNTER — Emergency Department (HOSPITAL_COMMUNITY)
Admission: EM | Admit: 2013-12-03 | Discharge: 2013-12-03 | Disposition: A | Discharge status: Home / Self Care | Payer: Managed Care, Other (non HMO) | Attending: Emergency Medicine | Admitting: Emergency Medicine

## 2013-12-03 ENCOUNTER — Encounter (HOSPITAL_COMMUNITY): Payer: Self-pay | Admitting: Emergency Medicine

## 2013-12-03 DIAGNOSIS — L03818 Cellulitis of other sites: Principal | ICD-10-CM

## 2013-12-03 DIAGNOSIS — L02811 Cutaneous abscess of head [any part, except face]: Secondary | ICD-10-CM

## 2013-12-03 DIAGNOSIS — F17200 Nicotine dependence, unspecified, uncomplicated: Secondary | ICD-10-CM | POA: Insufficient documentation

## 2013-12-03 DIAGNOSIS — Z79899 Other long term (current) drug therapy: Secondary | ICD-10-CM | POA: Insufficient documentation

## 2013-12-03 DIAGNOSIS — L02818 Cutaneous abscess of other sites: Secondary | ICD-10-CM | POA: Insufficient documentation

## 2013-12-03 MED ORDER — TRAMADOL HCL 50 MG PO TABS: 50.0000 mg | ORAL_TABLET | Freq: Four times a day (QID) | ORAL | Status: DC | PRN

## 2013-12-03 MED ORDER — CLINDAMYCIN HCL 150 MG PO CAPS: 150.0000 mg | ORAL_CAPSULE | Freq: Four times a day (QID) | ORAL | Status: DC

## 2013-12-03 NOTE — ED Provider Notes (Signed)
Medical screening examination/treatment/procedure(s) were conducted as a shared visit with non-physician practitioner(s) and myself.  I personally evaluated the patient during the encounter. Patient is a 43 year old male with swelling to the top of his head for the past 5 or 6 days. He poked it with a pin last night and drained out some liquid material. He denies any fevers or chills. He denies any similar episodes.  On exam, vitals are stable the patient is afebrile. He is noted to have a small 1 cm round fluctuant, tender area to the top of the scalp. I am able to express significant amount of pus from this.  This appears to be an abscess which will be treated with antibiotics, warm soaks, and when necessary followup.     Geoffery Lyonsouglas Kiko Ripp, MD 12/03/13 [phone]

## 2013-12-03 NOTE — Discharge Instructions (Signed)
Be sure to take all of your antibiotics even if you start feeling better. Follow up with your primary care provider for recheck of symptoms in 3-4 days if not improving.  You may take acetaminophen and ibuprofen as needed for fever and pain.  Tramadol may be taken for severe pain.  Do not drive while taking as it may cause drowsiness.   Abscess Care After An abscess (also called a boil or furuncle) is an infected area that contains a collection of pus. Signs and symptoms of an abscess include pain, tenderness, redness, or hardness, or you may feel a moveable soft area under your skin. An abscess can occur anywhere in the body. The infection may spread to surrounding tissues causing cellulitis. A cut (incision) by the surgeon was made over your abscess and the pus was drained out. Gauze may have been packed into the space to provide a drain that will allow the cavity to heal from the inside outwards. The boil may be painful for 5 to 7 days. Most people with a boil do not have high fevers. Your abscess, if seen early, may not have localized, and may not have been lanced. If not, another appointment may be required for this if it does not get better on its own or with medications. HOME CARE INSTRUCTIONS   Only take over-the-counter or prescription medicines for pain, discomfort, or fever as directed by your caregiver.  When you bathe, soak and then remove gauze or iodoform packs at least daily or as directed by your caregiver. You may then wash the wound gently with mild soapy water. Repack with gauze or do as your caregiver directs. SEEK IMMEDIATE MEDICAL CARE IF:   You develop increased pain, swelling, redness, drainage, or bleeding in the wound site.  You develop signs of generalized infection including muscle aches, chills, fever, or a general ill feeling.  An oral temperature above 102 F (38.9 C) develops, not controlled by medication. See your caregiver for a recheck if you develop any of the  symptoms described above. If medications (antibiotics) were prescribed, take them as directed. Document Released: 12/02/2004 Document Revised: 08/08/2011 Document Reviewed: 07/30/2007 [facility] Patient Information 2015 [place], [place]. This information is not intended to replace advice given to you by your health care provider. Make sure you discuss any questions you have with your health care provider.

## 2013-12-03 NOTE — ED Provider Notes (Signed)
CSN: [id number]     Arrival date & time 12/03/13  0941 History  This chart was scribed for non-physician practitioner, Junius FinnerErin O'Malley, PA-C, working with Geoffery Lyonsouglas Delo, MD by Charline BillsEssence Howell, ED Scribe. This patient was seen in room TR07C/TR07C and the patient's care was started at 9:50 AM.   Chief Complaint  Patient presents with  . Abscess   The history is provided by the patient. No language interpreter was used.   HPI Comments: Belinda FisherJames R Fuhrmann is a 43 y.o. male who presents to the Emergency Department complaining of abscess located on R scalp, first noted 5-6 days ago. Pt suspects that the abscess formed from working in an attic that was approximately 160 degrees. Pt reports pain to the area that he rates 5-6/10, 7-8 at worse. He describes the quality of pain as throbbing and sharp. He reports associated nausea, chills and fever onset last night. Tmax 100 F, ED temperature 98.2 F. Pt also reports pressure that radiates down into his R shoulder. He denies vomiting. Pt states that he applied a hot rag, massaged the area and used a needle to drain the abscess last night. Drainage was watery and bloody.  No h/o abscess. 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  History reviewed. No pertinent past medical history. Past Surgical History  Procedure Laterality Date  . Back surgery    . Hip surgery     No family history on file. History  Substance Use Topics  . Smoking status: Current Every Day Smoker  . Smokeless tobacco: Not on file  . Alcohol Use: Yes    Review of Systems  Constitutional: Positive for fever and chills.  Gastrointestinal: Positive for nausea. Negative for vomiting.  Skin:       Abscess  All other systems reviewed and are negative.  Allergies  Sulfa antibiotics  Home Medications   Prior to Admission medications   Medication Sig Start Date End Date Taking? Authorizing Provider  acetaminophen (TYLENOL) 500 MG tablet Take 500 mg by mouth  daily as needed for mild pain or fever.   Yes Historical Provider, MD  albuterol (PROVENTIL HFA;VENTOLIN HFA) 108 (90 BASE) MCG/ACT inhaler Inhale 1-2 puffs into the lungs every 6 (six) hours as needed for wheezing or shortness of breath.   Yes Historical Provider, MD  trolamine salicylate (ASPERCREME) 10 % cream Apply 1 application topically as needed for muscle pain.   Yes Historical Provider, MD  clindamycin (CLEOCIN) 150 MG capsule Take 1 capsule (150 mg total) by mouth every 6 (six) hours. 12/03/13   Junius FinnerErin O'Malley, PA-C  traMADol (ULTRAM) 50 MG tablet Take 1 tablet (50 mg total) by mouth every 6 (six) hours as needed. 12/03/13   Junius FinnerErin O'Malley, PA-C   Triage Vitals: BP 131/86  Pulse 89  Temp(Src) 98.2 F (36.8 C) (Oral)  Resp 16  SpO2 99% Physical Exam  Nursing note and vitals reviewed. Constitutional: He is oriented to person, place, and time. He appears well-developed and well-nourished.  HENT:  Head: Normocephalic and atraumatic.  Eyes: EOM are normal.  Neck: Normal range of motion.  Cardiovascular: Normal rate.   Pulmonary/Chest: Effort normal.  Musculoskeletal: Normal range of motion.  Neurological: He is alert and oriented to person, place, and time.  Skin: Skin is warm and dry.  R scalp over occipital region Quarter sized area of erythema and tenderness Mild fluctuance  Centralized area of dried blood  Scant serous drainage  Psychiatric: He has a normal mood  and affect. His behavior is normal.   ED Course  Procedures (including critical care time) DIAGNOSTIC STUDIES: Oxygen Saturation is 99% on RA, normal by my interpretation.    COORDINATION OF CARE: 9:53 AM-Discussed treatment plan with pt at bedside and pt agreed to plan.   Labs Review Labs Reviewed - No data to display  Imaging Review No results found.   EKG Interpretation None      MDM   Final diagnoses:  Scalp abscess    Pt is a 43yo male presenting to ED with abscess to scalp.  C/o associated  fever, chills and nausea.  No hx of abscesses in past.  Was able to drain at home last night. Scant discharge of pus today in ED. No I&D needed at this time.  Discussed pt with Dr. Judd Lienelo who agrees scalp lesion is an abscess. Will continue warm compresses as abscess is draining on it's own.  Will Rx Clindamycin as pt is allergic to sulfa antibiotics. Rx: tramadol as needed for pain as well as OTC acetaminophen and ibuprofen. Advised to f/u PCP in 3-4 days for recheck of symptoms. Return precautions provided. Pt verbalized understanding and agreement with tx plan.  I personally performed the services described in this documentation, which was scribed in my presence. The recorded information has been reviewed and is accurate.    Junius Finnerrin O'Malley, PA-C 12/03/13 1013

## 2013-12-03 NOTE — ED Notes (Signed)
Abscess to scalp x 5 days. States developed fever and chills last pm. Lesion to right parietal area red, swollen and painful.

## 2015-05-25 ENCOUNTER — Emergency Department (HOSPITAL_COMMUNITY): Payer: Commercial Managed Care - PPO

## 2015-05-25 ENCOUNTER — Encounter (HOSPITAL_COMMUNITY): Payer: Self-pay | Admitting: Emergency Medicine

## 2015-05-25 ENCOUNTER — Emergency Department (HOSPITAL_COMMUNITY)
Admission: EM | Admit: 2015-05-25 | Discharge: 2015-05-25 | Disposition: A | Discharge status: Home / Self Care | Payer: Commercial Managed Care - PPO | Attending: Emergency Medicine | Admitting: Emergency Medicine

## 2015-05-25 DIAGNOSIS — M542 Cervicalgia: Secondary | ICD-10-CM | POA: Diagnosis not present

## 2015-05-25 DIAGNOSIS — J3489 Other specified disorders of nose and nasal sinuses: Secondary | ICD-10-CM | POA: Diagnosis not present

## 2015-05-25 DIAGNOSIS — Z79899 Other long term (current) drug therapy: Secondary | ICD-10-CM | POA: Diagnosis not present

## 2015-05-25 DIAGNOSIS — Z792 Long term (current) use of antibiotics: Secondary | ICD-10-CM | POA: Diagnosis not present

## 2015-05-25 DIAGNOSIS — R509 Fever, unspecified: Secondary | ICD-10-CM | POA: Insufficient documentation

## 2015-05-25 DIAGNOSIS — R51 Headache: Secondary | ICD-10-CM | POA: Diagnosis not present

## 2015-05-25 DIAGNOSIS — R197 Diarrhea, unspecified: Secondary | ICD-10-CM | POA: Insufficient documentation

## 2015-05-25 DIAGNOSIS — R6889 Other general symptoms and signs: Secondary | ICD-10-CM

## 2015-05-25 DIAGNOSIS — F17210 Nicotine dependence, cigarettes, uncomplicated: Secondary | ICD-10-CM | POA: Diagnosis not present

## 2015-05-25 DIAGNOSIS — R0602 Shortness of breath: Secondary | ICD-10-CM | POA: Insufficient documentation

## 2015-05-25 DIAGNOSIS — R05 Cough: Secondary | ICD-10-CM | POA: Insufficient documentation

## 2015-05-25 DIAGNOSIS — R112 Nausea with vomiting, unspecified: Secondary | ICD-10-CM | POA: Insufficient documentation

## 2015-05-25 DIAGNOSIS — J029 Acute pharyngitis, unspecified: Secondary | ICD-10-CM | POA: Insufficient documentation

## 2015-05-25 MED ORDER — ONDANSETRON 4 MG PO TBDP
8.0000 mg | ORAL_TABLET | Freq: Once | ORAL | Status: AC
  Administered 2015-05-25: 8 mg via ORAL
  Filled 2015-05-25: qty 2

## 2015-05-25 MED ORDER — PSEUDOEPHEDRINE HCL 30 MG PO TABS: 30.0000 mg | ORAL_TABLET | ORAL | Status: DC | PRN

## 2015-05-25 MED ORDER — HYDROCODONE-HOMATROPINE 5-1.5 MG/5ML PO SYRP: 5.0000 mL | ORAL_SOLUTION | Freq: Four times a day (QID) | ORAL | Status: DC | PRN

## 2015-05-25 NOTE — Discharge Instructions (Signed)
Ibuprofen and Tylenol for fever and pain every 6 hours. Use intranasal saline for congestion. Sudafed as prescribed for congestion. Take cough medication as prescribed as needed for cough. Drink plenty of fluids, rest, follow-up with your doctor in 2-3 days if not improving.  Influenza, Adult Influenza ("the flu") is a viral infection of the respiratory tract. It occurs more often in winter months because people spend more time in close contact with one another. Influenza can make you feel very sick. Influenza easily spreads from person to person (contagious). CAUSES  Influenza is caused by a virus that infects the respiratory tract. You can catch the virus by breathing in droplets from an infected person's cough or sneeze. You can also catch the virus by touching something that was recently contaminated with the virus and then touching your mouth, nose, or eyes. RISKS AND COMPLICATIONS You may be at risk for a more severe case of influenza if you smoke cigarettes, have diabetes, have chronic heart disease (such as heart failure) or lung disease (such as asthma), or if you have a weakened immune system. Elderly people and pregnant women are also at risk for more serious infections. The most common problem of influenza is a lung infection (pneumonia). Sometimes, this problem can require emergency medical care and may be life threatening. SIGNS AND SYMPTOMS  Symptoms typically last 4 to 10 days and may include:  Fever.  Chills.  Headache, body aches, and muscle aches.  Sore throat.  Chest discomfort and cough.  Poor appetite.  Weakness or feeling tired.  Dizziness.  Nausea or vomiting. DIAGNOSIS  Diagnosis of influenza is often made based on your history and a physical exam. A nose or throat swab test can be done to confirm the diagnosis. TREATMENT  In mild cases, influenza goes away on its own. Treatment is directed at relieving symptoms. For more severe cases, your health care provider  may prescribe antiviral medicines to shorten the sickness. Antibiotic medicines are not effective because the infection is caused by a virus, not by bacteria. HOME CARE INSTRUCTIONS  Take medicines only as directed by your health care provider.  Use a cool mist humidifier to make breathing easier.  Get plenty of rest until your temperature returns to normal. This usually takes 3 to 4 days.  Drink enough fluid to keep your urine clear or pale yellow.  Cover yourmouth and nosewhen coughing or sneezing,and wash your handswellto prevent thevirusfrom spreading.  Stay homefromwork orschool untilthe fever is gonefor at least 161full day. PREVENTION  An annual influenza vaccination (flu shot) is the best way to avoid getting influenza. An annual flu shot is now routinely recommended for all adults in the [place] SEEK MEDICAL CARE IF:  You experiencechest pain, yourcough worsens,or you producemore mucus.  Youhave nausea,vomiting, ordiarrhea.  Your fever returns or gets worse. SEEK IMMEDIATE MEDICAL CARE IF:  You havetrouble breathing, you become short of breath,or your skin ornails becomebluish.  You have severe painor stiffnessin the neck.  You develop a sudden headache, or pain in the face or ear.  You have nausea or vomiting that you cannot control. MAKE SURE YOU:   Understand these instructions.  Will watch your condition.  Will get help right away if you are not doing well or get worse.   This information is not intended to replace advice given to you by your health care provider. Make sure you discuss any questions you have with your health care provider.   Document Released: 05/13/2000 Document Revised: 06/06/2014  Document Reviewed: 08/15/2011 Elsevier Interactive Patient Education [employer].

## 2015-05-25 NOTE — ED Provider Notes (Signed)
CSN: [id number]     Arrival date & time 05/25/15  0023 History   First MD Initiated Contact with Patient 05/25/15 0050     Chief Complaint  Patient presents with  . Emesis  . Fever  . Cough     (Consider location/radiation/quality/duration/timing/severity/associated sxs/prior Treatment) HPI [name] is a 44 y.o. male presents to emergency department complaining of congestion, sore throat, cough, fever, chills, general has body aches for 3 days. Patient reports green productive sputum with cough, reports waking up in the night gasping for air and coughing, reports trouble sleeping supine due to congestion and cough. He has been taking over-the-counter cold and flu medications with no relief of his symptoms. He states he is also having left shoulder pain from sleeping on it all night last night. Patient also reports 2 episodes of emesis yesterday. States "coughing makes her nauseated." Patient also reports fever daily, up to 102. He has been taking Tylenol and Motrin around-the-clock, last dose taken prior to coming here.  History reviewed. No pertinent past medical history. Past Surgical History  Procedure Laterality Date  . Back surgery    . Hip surgery    . Tonsillectomy      44 years old   History reviewed. No pertinent family history. Social History  Substance Use Topics  . Smoking status: Current Every Day Smoker -- 0.50 packs/day    Types: Cigarettes  . Smokeless tobacco: None  . Alcohol Use: Yes     Comment: socially    Review of Systems  Constitutional: Positive for fever and chills.  HENT: Positive for congestion and sore throat.   Respiratory: Positive for cough and shortness of breath. Negative for chest tightness.   Cardiovascular: Negative for chest pain, palpitations and leg swelling.  Gastrointestinal: Positive for nausea, vomiting and diarrhea. Negative for abdominal pain and abdominal distention.  Genitourinary: Negative for dysuria, urgency, frequency  and hematuria.  Musculoskeletal: Positive for myalgias, arthralgias and neck pain. Negative for neck stiffness.  Skin: Negative for rash.  Allergic/Immunologic: Negative for immunocompromised state.  Neurological: Positive for headaches. Negative for dizziness, weakness, light-headedness and numbness.  All other systems reviewed and are negative.     Allergies  Sulfa antibiotics  Home Medications   Prior to Admission medications   Medication Sig Start Date End Date Taking? Authorizing Provider  acetaminophen (TYLENOL) 500 MG tablet Take 500 mg by mouth daily as needed for mild pain or fever.    Historical Provider, MD  albuterol (PROVENTIL HFA;VENTOLIN HFA) 108 (90 BASE) MCG/ACT inhaler Inhale 1-2 puffs into the lungs every 6 (six) hours as needed for wheezing or shortness of breath.    Historical Provider, MD  clindamycin (CLEOCIN) 150 MG capsule Take 1 capsule (150 mg total) by mouth every 6 (six) hours. 12/03/13   Junius Finner, PA-C  traMADol (ULTRAM) 50 MG tablet Take 1 tablet (50 mg total) by mouth every 6 (six) hours as needed. 12/03/13   Junius Finner, PA-C  trolamine salicylate (ASPERCREME) 10 % cream Apply 1 application topically as needed for muscle pain.    Historical Provider, MD   BP 146/90 mmHg  Pulse 80  Temp(Src) 97.9 F (36.6 C) (Oral)  Resp 20  Ht 6' (1.829 m)  Wt 105.235 kg  BMI 31.46 kg/m2  SpO2 99% Physical Exam  Constitutional: He is oriented to person, place, and time. He appears well-developed and well-nourished. No distress.  HENT:  Head: Normocephalic and atraumatic.  Right Ear: Tympanic membrane, external ear and  ear canal normal.  Left Ear: Tympanic membrane, external ear and ear canal normal.  Nose: Mucosal edema and rhinorrhea present.  Mouth/Throat: Uvula is midline. Posterior oropharyngeal erythema present. No oropharyngeal exudate or posterior oropharyngeal edema.  Eyes: Conjunctivae are normal.  Neck: Neck supple.  Cardiovascular: Normal rate,  regular rhythm and normal heart sounds.   Pulmonary/Chest: Effort normal. No respiratory distress. He has no wheezes. He has no rales.  Abdominal: Soft. Bowel sounds are normal. He exhibits no distension. There is no tenderness. There is no rebound.  Musculoskeletal: He exhibits no edema.  Neurological: He is alert and oriented to person, place, and time.  Skin: Skin is warm and dry.  Nursing note and vitals reviewed.   ED Course  Procedures (including critical care time) Labs Review Labs Reviewed - No data to display  Imaging Review Dg Chest 2 View  05/25/2015  CLINICAL DATA:  44 year old male with fever, cough, shortness of breath EXAM: CHEST  2 VIEW COMPARISON:  Radiograph dated 07/17/2013 FINDINGS: The heart size and mediastinal contours are within normal limits. Both lungs are clear. The visualized skeletal structures are unremarkable. IMPRESSION: No active cardiopulmonary disease. Electronically Signed   By: Elgie CollardArash  Radparvar M.D.   On: 05/25/2015 01:24   I have personally reviewed and evaluated these images and lab results as part of my medical decision-making.   EKG Interpretation None      MDM   Final diagnoses:  Flu-like symptoms    Patient emergency department flulike symptoms. His vital signs are normal except for mildly elevated blood pressure. He is afebrile. He is not tachycardic. Oxygen saturation 99% on room air. Lungs are clear and exam. Given night sweats, productive cough, high fever we'll get a chest x-ray to rule out pneumonia. Zofran given for nausea.  1:40 AM Chest x-ray is negative. Patient is tolerating by mouth fluids in emergency department. Most likely influenza versus a viral URI. Patient's son had similar symptoms a few days ago. Discussed symptomatically treatment, will prescribe hycodan, Sudafed, continue ibuprofen and Tylenol regular basis, rest, drink plenty of fluids. Follow-up with primary care doctor.  Filed Vitals:   05/25/15 0049  BP:  146/90  Pulse: 80  Temp: 97.9 F (36.6 C)  Resp: [address]     Jody Aguinaga, PA-C 05/25/15 0220  Shon Batonourtney F Horton, MD 05/25/15 [id number]

## 2015-05-25 NOTE — ED Notes (Signed)
Pt presents with n/v, fever, diaphoresis, cough with green sputum production, nasal congestion; pt states it began x 3 days when he picked up his son who also presented with same symptoms;

## 2015-05-25 NOTE — ED Notes (Signed)
Patient transported to X-ray 

## 2015-07-08 ENCOUNTER — Emergency Department (HOSPITAL_COMMUNITY): Payer: Commercial Managed Care - PPO

## 2015-07-08 ENCOUNTER — Encounter (HOSPITAL_COMMUNITY): Payer: Self-pay | Admitting: Family Medicine

## 2015-07-08 ENCOUNTER — Emergency Department (HOSPITAL_COMMUNITY)
Admission: EM | Admit: 2015-07-08 | Discharge: 2015-07-08 | Disposition: A | Discharge status: Home / Self Care | Payer: Commercial Managed Care - PPO | Attending: Emergency Medicine | Admitting: Emergency Medicine

## 2015-07-08 DIAGNOSIS — J159 Unspecified bacterial pneumonia: Secondary | ICD-10-CM | POA: Diagnosis not present

## 2015-07-08 DIAGNOSIS — Z79899 Other long term (current) drug therapy: Secondary | ICD-10-CM | POA: Insufficient documentation

## 2015-07-08 DIAGNOSIS — R0981 Nasal congestion: Secondary | ICD-10-CM | POA: Diagnosis present

## 2015-07-08 DIAGNOSIS — J189 Pneumonia, unspecified organism: Secondary | ICD-10-CM

## 2015-07-08 DIAGNOSIS — Z792 Long term (current) use of antibiotics: Secondary | ICD-10-CM | POA: Diagnosis not present

## 2015-07-08 DIAGNOSIS — R059 Cough, unspecified: Secondary | ICD-10-CM

## 2015-07-08 DIAGNOSIS — F17210 Nicotine dependence, cigarettes, uncomplicated: Secondary | ICD-10-CM | POA: Diagnosis not present

## 2015-07-08 DIAGNOSIS — J329 Chronic sinusitis, unspecified: Secondary | ICD-10-CM | POA: Diagnosis not present

## 2015-07-08 DIAGNOSIS — R05 Cough: Secondary | ICD-10-CM

## 2015-07-08 MED ORDER — AMOXICILLIN-POT CLAVULANATE 875-125 MG PO TABS: 1.0000 | ORAL_TABLET | Freq: Two times a day (BID) | ORAL | Status: DC

## 2015-07-08 NOTE — ED Notes (Signed)
Pt here for cold symptoms that have been coming and going since christmas eve. sts sinus congestion and cough.

## 2015-07-08 NOTE — Discharge Instructions (Signed)
Sinusitis, Adult °Sinusitis is redness, soreness, and inflammation of the paranasal sinuses. Paranasal sinuses are air pockets within the bones of your face. They are located beneath your eyes, in the middle of your forehead, and above your eyes. In healthy paranasal sinuses, mucus is able to drain out, and air is able to circulate through them by way of your nose. However, when your paranasal sinuses are inflamed, mucus and air can become trapped. This can allow bacteria and other germs to grow and cause infection. °Sinusitis can develop quickly and last only a short time (acute) or continue over a long period (chronic). Sinusitis that lasts for more than 12 weeks is considered chronic. °CAUSES °Causes of sinusitis include: °· Allergies. °· Structural abnormalities, such as displacement of the cartilage that separates your nostrils (deviated septum), which can decrease the air flow through your nose and sinuses and affect sinus drainage. °· Functional abnormalities, such as when the small hairs (cilia) that line your sinuses and help remove mucus do not work properly or are not present. °SIGNS AND SYMPTOMS °Symptoms of acute and chronic sinusitis are the same. The primary symptoms are pain and pressure around the affected sinuses. Other symptoms include: °· Upper toothache. °· Earache. °· Headache. °· Bad breath. °· Decreased sense of smell and taste. °· A cough, which worsens when you are lying flat. °· Fatigue. °· Fever. °· Thick drainage from your nose, which often is green and may contain pus (purulent). °· Swelling and warmth over the affected sinuses. °DIAGNOSIS °Your health care provider will perform a physical exam. During your exam, your health care provider may perform any of the following to help determine if you have acute sinusitis or chronic sinusitis: °· Look in your nose for signs of abnormal growths in your nostrils (nasal polyps). °· Tap over the affected sinus to check for signs of  infection. °· View the inside of your sinuses using an imaging device that has a light attached (endoscope). °If your health care provider suspects that you have chronic sinusitis, one or more of the following tests may be recommended: °· Allergy tests. °· Nasal culture. A sample of mucus is taken from your nose, sent to a lab, and screened for bacteria. °· Nasal cytology. A sample of mucus is taken from your nose and examined by your health care provider to determine if your sinusitis is related to an allergy. °TREATMENT °Most cases of acute sinusitis are related to a viral infection and will resolve on their own within 10 days. Sometimes, medicines are prescribed to help relieve symptoms of both acute and chronic sinusitis. These may include pain medicines, decongestants, nasal steroid sprays, or saline sprays. °However, for sinusitis related to a bacterial infection, your health care provider will prescribe antibiotic medicines. These are medicines that will help kill the bacteria causing the infection. °Rarely, sinusitis is caused by a fungal infection. In these cases, your health care provider will prescribe antifungal medicine. °For some cases of chronic sinusitis, surgery is needed. Generally, these are cases in which sinusitis recurs more than 3 times per year, despite other treatments. °HOME CARE INSTRUCTIONS °· Drink plenty of water. Water helps thin the mucus so your sinuses can drain more easily. °· Use a humidifier. °· Inhale steam 3-4 times a day (for example, sit in the bathroom with the shower running). °· Apply a warm, moist washcloth to your face 3-4 times a day, or as directed by your health care provider. °· Use saline nasal sprays to help   moisten and clean your sinuses.  Take medicines only as directed by your health care provider.  If you were prescribed either an antibiotic or antifungal medicine, finish it all even if you start to feel better. SEEK IMMEDIATE MEDICAL CARE IF:  You have  increasing pain or severe headaches.  You have nausea, vomiting, or drowsiness.  You have swelling around your face.  You have vision problems.  You have a stiff neck.  You have difficulty breathing.   This information is not intended to replace advice given to you by your health care provider. Make sure you discuss any questions you have with your health care provider.   Document Released: 05/16/2005 Document Revised: 06/06/2014 Document Reviewed: 05/31/2011 Elsevier Interactive Patient Education 2016 [employer]  Community-Acquired Pneumonia, Adult Pneumonia is an infection of the lungs. One type of pneumonia can happen while a person is in a hospital. A different type can happen when a person is not in a hospital (community-acquired pneumonia). It is easy for this kind to spread from person to person. It can spread to you if you breathe near an infected person who coughs or sneezes. Some symptoms include:  A dry cough.  A wet (productive) cough.  Fever.  Sweating.  Chest pain. HOME CARE  Take over-the-counter and prescription medicines only as told by your doctor.  Only take cough medicine if you are losing sleep.  If you were prescribed an antibiotic medicine, take it as told by your doctor. Do not stop taking the antibiotic even if you start to feel better.  Sleep with your head and neck raised (elevated). You can do this by putting a few pillows under your head, or you can sleep in a recliner.  Do not use tobacco products. These include cigarettes, chewing tobacco, and e-cigarettes. If you need help quitting, ask your doctor.  Drink enough water to keep your pee (urine) clear or pale yellow. A shot (vaccine) can help prevent pneumonia. Shots are often suggested for:  People older than 44 years of age.  People older than 44 years of age:  Who are having cancer treatment.  Who have long-term (chronic) lung disease.  Who have problems with their body's  defense system (immune system). You may also prevent pneumonia if you take these actions:  Get the flu (influenza) shot every year.  Go to the dentist as often as told.  Wash your hands often. If soap and water are not available, use hand sanitizer. GET HELP IF:  You have a fever.  You lose sleep because your cough medicine does not help. GET HELP RIGHT AWAY IF:  You are short of breath and it gets worse.  You have more chest pain.  Your sickness gets worse. This is very serious if:  You are an older adult.  Your body's defense system is weak.  You cough up blood.   This information is not intended to replace advice given to you by your health care provider. Make sure you discuss any questions you have with your health care provider.   Document Released: 11/02/2007 Document Revised: 02/04/2015 Document Reviewed: 09/10/2014 Elsevier Interactive Patient Education [employer]

## 2015-07-08 NOTE — ED Provider Notes (Signed)
CSN: [id number]     Arrival date & time 07/08/15  1042 History  By signing my name below, I, Soijett Blue, attest that this documentation has been prepared under the direction and in the presence of Roxy Horseman, PA-C Electronically Signed: Soijett Blue, ED Scribe. 07/08/2015. 12:05 PM.   Chief Complaint  Patient presents with  . URI      The history is provided by the patient. No language interpreter was used.    [name] is a 45 y.o. male who presents to the Emergency Department complaining of gradual onset, intermittent, nasal congestion onset 7 weeks and 5 days. He notes that his uri-like symptoms will go away for 1 week and then come back more intense. 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 Pt notes that he has been seen for his symptoms and he was not Rx abx at the time. He states that he is having associated symptoms of productive cough x green sputum, rhinorrhea, sore throat, and sinus pressure. He states that he has tried OTC tylenol cold and flu with no relief for his symptoms. He denies fever and any other symptoms. He reports that he has allergies to sulfa abx.   Per pt chart review: Pt was seen in the ED on 05/25/2015 for flu-like symptoms. Pt had a negative CXR at the time. Pt was Rx hycodan, sudafed, and informed to use ibuprofen/tylenol PRN and follow up with PCP.  History reviewed. No pertinent past medical history. Past Surgical History  Procedure Laterality Date  . Back surgery    . Hip surgery    . Tonsillectomy      46 years old   History reviewed. No pertinent family history. Social History  Substance Use Topics  . Smoking status: Current Every Day Smoker -- 0.50 packs/day    Types: Cigarettes  . Smokeless tobacco: None  . Alcohol Use: Yes     Comment: socially    Review of Systems  Constitutional: Negative for fever.  HENT: Positive for congestion, rhinorrhea, sinus pressure and sore  throat.   Respiratory: Positive for cough.      Allergies  Sulfa antibiotics  Home Medications   Prior to Admission medications   Medication Sig Start Date End Date Taking? Authorizing Provider  acetaminophen (TYLENOL) 500 MG tablet Take 500 mg by mouth daily as needed for mild pain or fever.    Historical Provider, MD  albuterol (PROVENTIL HFA;VENTOLIN HFA) 108 (90 BASE) MCG/ACT inhaler Inhale 1-2 puffs into the lungs every 6 (six) hours as needed for wheezing or shortness of breath.    Historical Provider, MD  clindamycin (CLEOCIN) 150 MG capsule Take 1 capsule (150 mg total) by mouth every 6 (six) hours. 12/03/13   Junius Finner, PA-C  HYDROcodone-homatropine (HYCODAN) 5-1.5 MG/5ML syrup Take 5 mLs by mouth every 6 (six) hours as needed for cough. 05/25/15   Tatyana Kirichenko, PA-C  pseudoephedrine (SUDAFED) 30 MG tablet Take 1 tablet (30 mg total) by mouth every 4 (four) hours as needed for congestion. 05/25/15   Tatyana Kirichenko, PA-C  traMADol (ULTRAM) 50 MG tablet Take 1 tablet (50 mg total) by mouth every 6 (six) hours as needed. 12/03/13   Junius Finner, PA-C  trolamine salicylate (ASPERCREME) 10 % cream Apply 1 application topically as needed for muscle pain.    Historical Provider, MD   BP 131/93 mmHg  Pulse 98  Temp(Src) 98.2 F (36.8 C) (Oral)  Resp 18  SpO2 97% Physical Exam  Constitutional: He is oriented to person, place, and time. He appears well-developed and well-nourished.  HENT:  Head: Normocephalic and atraumatic.  Right Ear: External ear normal.  Left Ear: External ear normal.  Mouth/Throat: Oropharynx is clear and moist. No oropharyngeal exudate.  Swollen, erythematous turbinates, maxillary sinuses tender to palpation  Eyes: Conjunctivae and EOM are normal. Pupils are equal, round, and reactive to light.  Neck: Normal range of motion. Neck supple.  Cardiovascular: Normal rate, regular rhythm and normal heart sounds.   Pulmonary/Chest: Effort normal and  breath sounds normal. No respiratory distress. He has no wheezes. He has no rales. He exhibits no tenderness.  Abdominal: Soft. Bowel sounds are normal.  Musculoskeletal: Normal range of motion.  Neurological: He is alert and oriented to person, place, and time.  Skin: Skin is warm and dry.  Psychiatric: He has a normal mood and affect. His behavior is normal. Judgment and thought content normal.  Nursing note and vitals reviewed.   ED Course  Procedures (including critical care time) DIAGNOSTIC STUDIES: Oxygen Saturation is 97% on RA, nl by my interpretation.    COORDINATION OF CARE: 12:05 PM Discussed treatment plan with pt at bedside which includes CXR and abx Rx and pt agreed to plan.   Labs Review Labs Reviewed - No data to display  Imaging Review Dg Chest 2 View  07/08/2015  CLINICAL DATA:  Cough, congestion and fever for the last 2 months. EXAM: CHEST  2 VIEW COMPARISON:  05/25/2015 FINDINGS: Heart size is normal. Mediastinal shadows are normal. The patient has developed a small infiltrate in the right middle lobe. The remainder the chest is clear. No effusions. No bony abnormalities. IMPRESSION: Development of a small infiltrate in the right middle lobe consistent with bronchopneumonia. Electronically Signed   By: Paulina Fusi M.D.   On: 07/08/2015 12:14      EKG Interpretation None      MDM   Final diagnoses:  Recurrent sinusitis, unspecified chronicity, unspecified location  CAP (community acquired pneumonia)    Patient with CAP and sinusitis.  Will treat with augmentin.  VSS.  No commorbidities. Appropriate for outpatient management.  I personally performed the services described in this documentation, which was scribed in my presence. The recorded information has been reviewed and is accurate.      Roxy Horseman, PA-C 07/08/15 1221  Tilden Fossa, MD 07/09/15 [phone]

## 2015-11-11 ENCOUNTER — Emergency Department (HOSPITAL_COMMUNITY)
Admission: EM | Admit: 2015-11-11 | Discharge: 2015-11-11 | Disposition: A | Discharge status: Home / Self Care | Payer: Commercial Managed Care - PPO | Attending: Emergency Medicine | Admitting: Emergency Medicine

## 2015-11-11 ENCOUNTER — Emergency Department (HOSPITAL_COMMUNITY): Payer: Commercial Managed Care - PPO

## 2015-11-11 ENCOUNTER — Encounter (HOSPITAL_COMMUNITY): Payer: Self-pay | Admitting: Emergency Medicine

## 2015-11-11 DIAGNOSIS — J189 Pneumonia, unspecified organism: Secondary | ICD-10-CM

## 2015-11-11 DIAGNOSIS — Z79899 Other long term (current) drug therapy: Secondary | ICD-10-CM | POA: Insufficient documentation

## 2015-11-11 DIAGNOSIS — F17210 Nicotine dependence, cigarettes, uncomplicated: Secondary | ICD-10-CM | POA: Diagnosis not present

## 2015-11-11 DIAGNOSIS — R0602 Shortness of breath: Secondary | ICD-10-CM | POA: Diagnosis present

## 2015-11-11 LAB — BASIC METABOLIC PANEL
Anion gap: 7 (ref 5–15)
BUN: 12 mg/dL (ref 6–20)
CALCIUM: 9.9 mg/dL (ref 8.9–10.3)
CO2: 26 mmol/L (ref 22–32)
CREATININE: 1.17 mg/dL (ref 0.61–1.24)
Chloride: 103 mmol/L (ref 101–111)
GFR calc non Af Amer: >60 mL/min (ref >60)
Glucose, Bld: 105 mg/dL — ABNORMAL HIGH (ref 65–99)
Potassium: 3.4 mmol/L — ABNORMAL LOW (ref 3.5–5.1)
Sodium: 136 mmol/L (ref 135–145)

## 2015-11-11 LAB — CBC WITH DIFFERENTIAL/PLATELET
BASOS PCT: 0 %
Basophils Absolute: 0 10*3/uL (ref 0.0–0.1)
EOS PCT: 3 %
Eosinophils Absolute: 0.3 10*3/uL (ref 0.0–0.7)
HCT: 46.6 % (ref 39.0–52.0)
Hemoglobin: 15.8 g/dL (ref 13.0–17.0)
Lymphocytes Relative: 42 %
Lymphs Abs: 4.2 10*3/uL — ABNORMAL HIGH (ref 0.7–4.0)
MCH: 31.2 pg (ref 26.0–34.0)
MCHC: 33.9 g/dL (ref 30.0–36.0)
MCV: 92.1 fL (ref 78.0–100.0)
MONO ABS: 0.6 10*3/uL (ref 0.1–1.0)
Monocytes Relative: 6 %
Neutro Abs: 5 10*3/uL (ref 1.7–7.7)
Neutrophils Relative %: 49 %
Platelets: 193 10*3/uL (ref 150–400)
RBC: 5.06 MIL/uL (ref 4.22–5.81)
RDW: 12.8 % (ref 11.5–15.5)
WBC: 10.1 10*3/uL (ref 4.0–10.5)

## 2015-11-11 LAB — TROPONIN I: Troponin I: <0.03 ng/mL (ref <0.031)

## 2015-11-11 MED ORDER — LEVOFLOXACIN IN D5W 750 MG/150ML IV SOLN
750.0000 mg | Freq: Once | INTRAVENOUS | Status: AC
  Administered 2015-11-11: 750 mg via INTRAVENOUS
  Filled 2015-11-11: qty 150

## 2015-11-11 MED ORDER — ALBUTEROL SULFATE HFA 108 (90 BASE) MCG/ACT IN AERS: 2.0000 | INHALATION_SPRAY | RESPIRATORY_TRACT | Status: DC | PRN

## 2015-11-11 MED ORDER — ALBUTEROL SULFATE (2.5 MG/3ML) 0.083% IN NEBU
2.5000 mg | INHALATION_SOLUTION | Freq: Once | RESPIRATORY_TRACT | Status: AC
  Administered 2015-11-11: 2.5 mg via RESPIRATORY_TRACT
  Filled 2015-11-11: qty 3

## 2015-11-11 MED ORDER — LEVOFLOXACIN 750 MG PO TABS: 750.0000 mg | ORAL_TABLET | Freq: Every day | ORAL | Status: DC

## 2015-11-11 NOTE — ED Notes (Signed)
Patient left at this time with all belongings, refused wheelchair. 

## 2015-11-11 NOTE — ED Provider Notes (Signed)
CSN: [id number]     Arrival date & time 11/11/15  0127 History   First MD Initiated Contact with Patient 11/11/15 0430     Chief Complaint  Patient presents with  . Shortness of Breath     (Consider location/radiation/quality/duration/timing/severity/associated sxs/prior Treatment) HPI Comments: Presents to the emergency department for evaluation of shortness of breath and cough. Symptoms have been ongoing for 4 days. Patient reports that he has been running a fever and cough has been worsening. He does report pain when he takes a deep breath and coughs. He has had pneumonia previously with similar symptoms. 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  Patient is a 45 y.o. male presenting with shortness of breath.  Shortness of Breath Associated symptoms: cough and fever     History reviewed. No pertinent past medical history. Past Surgical History  Procedure Laterality Date  . Back surgery    . Hip surgery    . Tonsillectomy      45 years old   No family history on file. Social History  Substance Use Topics  . Smoking status: Current Every Day Smoker -- 0.50 packs/day    Types: Cigarettes  . Smokeless tobacco: None  . Alcohol Use: Yes     Comment: socially    Review of Systems  Constitutional: Positive for fever and chills.  Respiratory: Positive for cough and shortness of breath.   All other systems reviewed and are negative.     Allergies  Sulfa antibiotics  Home Medications   Prior to Admission medications   Medication Sig Start Date End Date Taking? Authorizing Provider  acetaminophen (TYLENOL) 500 MG tablet Take 500 mg by mouth daily as needed for mild pain or fever.    Historical Provider, MD  albuterol (PROVENTIL HFA;VENTOLIN HFA) 108 (90 BASE) MCG/ACT inhaler Inhale 1-2 puffs into the lungs every 6 (six) hours as needed for wheezing or shortness of breath.    Historical  Provider, MD  amoxicillin-clavulanate (AUGMENTIN) 875-125 MG tablet Take 1 tablet by mouth every 12 (twelve) hours. 07/08/15   Roxy Horseman, PA-C  clindamycin (CLEOCIN) 150 MG capsule Take 1 capsule (150 mg total) by mouth every 6 (six) hours. 12/03/13   Junius Finner, PA-C  HYDROcodone-homatropine (HYCODAN) 5-1.5 MG/5ML syrup Take 5 mLs by mouth every 6 (six) hours as needed for cough. 05/25/15   Tatyana Kirichenko, PA-C  pseudoephedrine (SUDAFED) 30 MG tablet Take 1 tablet (30 mg total) by mouth every 4 (four) hours as needed for congestion. 05/25/15   Tatyana Kirichenko, PA-C  traMADol (ULTRAM) 50 MG tablet Take 1 tablet (50 mg total) by mouth every 6 (six) hours as needed. 12/03/13   Junius Finner, PA-C  trolamine salicylate (ASPERCREME) 10 % cream Apply 1 application topically as needed for muscle pain.    Historical Provider, MD   BP 129/86 mmHg  Pulse 59  Temp(Src) 98.1 F (36.7 C) (Oral)  Resp 17  Ht 6' (1.829 m)  Wt 231 lb (104.781 kg)  BMI 31.32 kg/m2  SpO2 98% Physical Exam  Constitutional: He is oriented to person, place, and time. He appears well-developed and well-nourished. No distress.  HENT:  Head: Normocephalic and atraumatic.  Right Ear: Hearing normal.  Left Ear: Hearing normal.  Nose: Nose normal.  Mouth/Throat: Oropharynx is clear and moist and mucous membranes are normal.  Eyes: Conjunctivae and EOM are normal. Pupils are equal, round, and reactive to light.  Neck: Normal  range of motion. Neck supple.  Cardiovascular: Regular rhythm, S1 normal and S2 normal.  Exam reveals no gallop and no friction rub.   No murmur heard. Pulmonary/Chest: Effort normal and breath sounds normal. No respiratory distress. He exhibits no tenderness.  Abdominal: Soft. Normal appearance and bowel sounds are normal. There is no hepatosplenomegaly. There is no tenderness. There is no rebound, no guarding, no tenderness at McBurney's point and negative Murphy's sign. No hernia.   Musculoskeletal: Normal range of motion.  Neurological: He is alert and oriented to person, place, and time. He has normal strength. No cranial nerve deficit or sensory deficit. Coordination normal. GCS eye subscore is 4. GCS verbal subscore is 5. GCS motor subscore is 6.  Skin: Skin is warm, dry and intact. No rash noted. No cyanosis.  Psychiatric: He has a normal mood and affect. His speech is normal and behavior is normal. Thought content normal.  Nursing note and vitals reviewed.   ED Course  Procedures (including critical care time) Labs Review Labs Reviewed  CBC WITH DIFFERENTIAL/PLATELET - Abnormal; Notable for the following:    Lymphs Abs 4.2 (*)    All other components within normal limits  BASIC METABOLIC PANEL - Abnormal; Notable for the following:    Potassium 3.4 (*)    Glucose, Bld 105 (*)    All other components within normal limits  TROPONIN I    Imaging Review Dg Chest 2 View  11/11/2015  CLINICAL DATA:  Chronic cough.  Initial encounter. EXAM: CHEST  2 VIEW COMPARISON:  Chest radiograph performed 07/08/2015 FINDINGS: The lungs are well-aerated. Minimal bilateral opacities may reflect atelectasis or possibly mild infection. There is no evidence of pleural effusion or pneumothorax. The heart is normal in size; the mediastinal contour is within normal limits. No acute osseous abnormalities are seen. IMPRESSION: Minimal bilateral opacities may reflect atelectasis or possibly mild infection. Electronically Signed   By: Roanna RaiderJeffery  Chang M.D.   On: 11/11/2015 02:34   I have personally reviewed and evaluated these images and lab results as part of my medical decision-making.   EKG Interpretation   Date/Time:  Wednesday November 11 2015 01:36:11 EDT Ventricular Rate:  91 PR Interval:  142 QRS Duration: 90 QT Interval:  358 QTC Calculation: 440 R Axis:   95 Text Interpretation:  Normal sinus rhythm Rightward axis Borderline ECG No  significant change since last tracing in  2010 Confirmed by WARD,  DO,  KRISTEN ([id number]) on 11/11/2015 2:40:21 AM      MDM   Final diagnoses:  CAP (community acquired pneumonia)    Presents with four-day history of fever, chills, cough, shortness of breath. Oxygenation is normal on room air. He is afebrile with no tachycardia or tachypnea. Chest x-ray concerning for bilateral pneumonia. As patient is not in any respiratory distress and not exhibiting any hypoxia, he is appropriate for outpatient management. Patient administered albuterol and IV Levaquin in the ER, will continue albuterol and course of Levaquin as an outpatient.  Gilda Creasehristopher J Suhaylah Wampole, MD 11/11/15 [phone]

## 2015-11-11 NOTE — ED Notes (Addendum)
Pt st's he has had shortness of breath, cough, fever and cough x's 4 days,.  Also has had nausea and vomiting.  Pt st's he has pain in his chest when he coughs or takes a deep breath

## 2015-11-11 NOTE — Discharge Instructions (Signed)

## 2016-06-07 ENCOUNTER — Emergency Department (HOSPITAL_COMMUNITY): Payer: Commercial Managed Care - PPO

## 2016-06-07 ENCOUNTER — Encounter (HOSPITAL_COMMUNITY): Payer: Self-pay | Admitting: Emergency Medicine

## 2016-06-07 ENCOUNTER — Emergency Department (HOSPITAL_COMMUNITY)
Admission: EM | Admit: 2016-06-07 | Discharge: 2016-06-07 | Disposition: A | Discharge status: Home / Self Care | Payer: Commercial Managed Care - PPO | Attending: Emergency Medicine | Admitting: Emergency Medicine

## 2016-06-07 DIAGNOSIS — J111 Influenza due to unidentified influenza virus with other respiratory manifestations: Secondary | ICD-10-CM | POA: Insufficient documentation

## 2016-06-07 DIAGNOSIS — F17210 Nicotine dependence, cigarettes, uncomplicated: Secondary | ICD-10-CM | POA: Diagnosis not present

## 2016-06-07 DIAGNOSIS — R69 Illness, unspecified: Secondary | ICD-10-CM

## 2016-06-07 DIAGNOSIS — R05 Cough: Secondary | ICD-10-CM | POA: Diagnosis present

## 2016-06-07 LAB — CBC WITH DIFFERENTIAL/PLATELET
BASOS ABS: 0 10*3/uL (ref 0.0–0.1)
BASOS PCT: 0 %
Eosinophils Absolute: 0.2 10*3/uL (ref 0.0–0.7)
Eosinophils Relative: 2 %
HEMATOCRIT: 46.3 % (ref 39.0–52.0)
Hemoglobin: 15.9 g/dL (ref 13.0–17.0)
Lymphocytes Relative: 38 %
Lymphs Abs: 4 10*3/uL (ref 0.7–4.0)
MCH: 32.2 pg (ref 26.0–34.0)
MCHC: 34.3 g/dL (ref 30.0–36.0)
MCV: 93.7 fL (ref 78.0–100.0)
Monocytes Absolute: 0.6 10*3/uL (ref 0.1–1.0)
Monocytes Relative: 6 %
NEUTROS ABS: 5.8 10*3/uL (ref 1.7–7.7)
NEUTROS PCT: 54 %
Platelets: 174 10*3/uL (ref 150–400)
RBC: 4.94 MIL/uL (ref 4.22–5.81)
RDW: 12.6 % (ref 11.5–15.5)
WBC: 10.7 10*3/uL — AB (ref 4.0–10.5)

## 2016-06-07 LAB — COMPREHENSIVE METABOLIC PANEL
ALBUMIN: 3.9 g/dL (ref 3.5–5.0)
ALK PHOS: 53 U/L (ref 38–126)
ALT: 48 U/L (ref 17–63)
ANION GAP: 9 (ref 5–15)
AST: 44 U/L — ABNORMAL HIGH (ref 15–41)
BUN: 7 mg/dL (ref 6–20)
CO2: 25 mmol/L (ref 22–32)
Calcium: 9.3 mg/dL (ref 8.9–10.3)
Chloride: 105 mmol/L (ref 101–111)
Creatinine, Ser: 1.14 mg/dL (ref 0.61–1.24)
GFR calc Af Amer: >60 mL/min (ref >60)
GFR calc non Af Amer: >60 mL/min (ref >60)
Glucose, Bld: 112 mg/dL — ABNORMAL HIGH (ref 65–99)
Potassium: 3.5 mmol/L (ref 3.5–5.1)
SODIUM: 139 mmol/L (ref 135–145)
Total Bilirubin: 0.4 mg/dL (ref 0.3–1.2)
Total Protein: 6.5 g/dL (ref 6.5–8.1)

## 2016-06-07 MED ORDER — ONDANSETRON 4 MG PO TBDP: 4.0000 mg | ORAL_TABLET | Freq: Three times a day (TID) | ORAL | 0 refills | Status: DC | PRN

## 2016-06-07 MED ORDER — BENZONATATE 100 MG PO CAPS: 200.0000 mg | ORAL_CAPSULE | Freq: Two times a day (BID) | ORAL | 0 refills | Status: DC | PRN

## 2016-06-07 MED ORDER — OSELTAMIVIR PHOSPHATE 75 MG PO CAPS: 75.0000 mg | ORAL_CAPSULE | Freq: Two times a day (BID) | ORAL | 0 refills | Status: DC

## 2016-06-07 NOTE — ED Provider Notes (Signed)
[facility]-EMERGENCY DEPT Provider Note   CSN: [id number] Arrival date & time: 06/07/16  0314     History   Chief Complaint Chief Complaint  Patient presents with  . Cough    Chest Congestion     HPI [name] is a 46 y.o. male.  Patient presents to the ED with a chief complaint of fever, cough, and body aches.  States that his symptoms started 2 days ago.  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  He reports associated nausea and vomiting, which he attributes to coughing so much.  He did not get a flu shot this year.  He denies any other associated symptoms or modifying factors.   The history is provided by the patient. No language interpreter was used.    History reviewed. No pertinent past medical history.  There are no active problems to display for this patient.   Past Surgical History:  Procedure Laterality Date  . BACK SURGERY    . HIP SURGERY    . TONSILLECTOMY     46 years old       Home Medications    Prior to Admission medications   Medication Sig Start Date End Date Taking? Authorizing Provider  acetaminophen (TYLENOL) 500 MG tablet Take 500 mg by mouth daily as needed for mild pain or fever.    Historical Provider, MD  albuterol (PROVENTIL HFA;VENTOLIN HFA) 108 (90 Base) MCG/ACT inhaler Inhale 2 puffs into the lungs every 4 (four) hours as needed for wheezing or shortness of breath. 11/11/15   Gilda Creasehristopher J Pollina, MD  benzonatate (TESSALON) 100 MG capsule Take 2 capsules (200 mg total) by mouth 2 (two) times daily as needed for cough. 06/07/16   Roxy Horsemanobert Krystle Polcyn, PA-C  levofloxacin (LEVAQUIN) 750 MG tablet Take 1 tablet (750 mg total) by mouth daily. 11/11/15   Gilda Creasehristopher J Pollina, MD  ondansetron (ZOFRAN ODT) 4 MG disintegrating tablet Take 1 tablet (4 mg total) by mouth every 8 (eight) hours as needed for nausea or vomiting. 06/07/16   Roxy Horsemanobert Heberto Sturdevant, PA-C  oseltamivir (TAMIFLU) 75 MG capsule Take 1  capsule (75 mg total) by mouth every 12 (twelve) hours. 06/07/16   Roxy Horsemanobert Addalie Calles, PA-C  oxyCODONE-acetaminophen (PERCOCET/ROXICET) 5-325 MG tablet Take 1 tablet by mouth every 8 (eight) hours as needed for moderate pain or severe pain.    Historical Provider, MD  traMADol (ULTRAM) 50 MG tablet Take 1 tablet (50 mg total) by mouth every 6 (six) hours as needed. Patient taking differently: Take 50 mg by mouth every 6 (six) hours as needed for moderate pain.  12/03/13   Junius FinnerErin O'Malley, PA-C  trolamine salicylate (ASPERCREME) 10 % cream Apply 1 application topically as needed for muscle pain.    Historical Provider, MD    Family History No family history on file.  Social History Social History  Substance Use Topics  . Smoking status: Current Every Day Smoker    Packs/day: 0.50    Types: Cigarettes  . Smokeless tobacco: Never Used  . Alcohol use Yes     Comment: socially     Allergies   Sulfa antibiotics   Review of Systems Review of Systems  Constitutional: Positive for chills and fever.  HENT: Positive for postnasal drip, rhinorrhea and sore throat. Negative for sinus pressure and sneezing.   Respiratory: Positive for cough. Negative for shortness of breath.   Cardiovascular: Negative for chest pain.  Gastrointestinal: Negative for  abdominal pain, constipation, diarrhea, nausea and vomiting.  Genitourinary: Negative for dysuria.  All other systems reviewed and are negative.    Physical Exam Updated Vital Signs BP 126/78 (BP Location: Left Arm)   Pulse 76   Temp 98.3 F (36.8 C) (Oral)   Resp 19   SpO2 100%   Physical Exam Physical Exam  Constitutional: Pt  is oriented to person, place, and time. Appears well-developed and well-nourished. No distress.  HENT:  Head: Normocephalic and atraumatic.  Right Ear: Tympanic membrane, external ear and ear canal normal.  Left Ear: Tympanic membrane, external ear and ear canal normal.  Nose: Mucosal edema and moderate rhinorrhea  present. No epistaxis. Right sinus exhibits no maxillary sinus tenderness and no frontal sinus tenderness. Left sinus exhibits no maxillary sinus tenderness and no frontal sinus tenderness.  Mouth/Throat: Uvula is midline and mucous membranes are normal. Mucous membranes are not pale and not cyanotic. No oropharyngeal exudate, posterior oropharyngeal edema, posterior oropharyngeal erythema or tonsillar abscesses.  Eyes: Conjunctivae are normal. Pupils are equal, round, and reactive to light.  Neck: Normal range of motion and full passive range of motion without pain.  Cardiovascular: Normal rate and intact distal pulses.   Pulmonary/Chest: Effort normal and breath sounds normal. No stridor.  Clear and equal breath sounds without focal wheezes, rhonchi, rales  Abdominal: Soft. Bowel sounds are normal. There is no tenderness.  Musculoskeletal: Normal range of motion.  Lymphadenopathy:    Pthas no cervical adenopathy.  Neurological: Pt is alert and oriented to person, place, and time.  Skin: Skin is warm and dry. No rash noted. Pt is not diaphoretic.  Psychiatric: Normal mood and affect.  Nursing note and vitals reviewed.    ED Treatments / Results  Labs (all labs ordered are listed, but only abnormal results are displayed) Labs Reviewed  CBC WITH DIFFERENTIAL/PLATELET - Abnormal; Notable for the following:       Result Value   WBC 10.7 (*)    All other components within normal limits  COMPREHENSIVE METABOLIC PANEL - Abnormal; Notable for the following:    Glucose, Bld 112 (*)    AST 44 (*)    All other components within normal limits    EKG  EKG Interpretation None       Radiology Dg Chest 2 View  Result Date: 06/07/2016 CLINICAL DATA:  Productive cough, congestion, chills, and fatigue since last week. Shortness of breath. Nausea and vomiting. EXAM: CHEST  2 VIEW COMPARISON:  11/11/2015 FINDINGS: The heart size and mediastinal contours are within normal limits. Both lungs are  clear. The visualized skeletal structures are unremarkable. IMPRESSION: No active cardiopulmonary disease. Electronically Signed   By: Burman Nieves M.D.   On: 06/07/2016 03:46    Procedures Procedures (including critical care time)  Medications Ordered in ED Medications - No data to display   Initial Impression / Assessment and Plan / ED Course  I have reviewed the triage vital signs and the nursing notes.  Pertinent labs & imaging results that were available during my care of the patient were reviewed by me and considered in my medical decision making (see chart for details).  Clinical Course     Patient with symptoms consistent with influenza.  Vitals are stable, reports fever at home.  No signs of dehydration.  Lungs are clear. Discussed treatment with Tamiflu.  Patient will be discharged with instructions to orally hydrate, rest, and use over-the-counter medications such as anti-inflammatories ibuprofen and Aleve for muscle aches and  Tylenol for fever.  Patient will also be given a cough suppressant and zofran.    Final Clinical Impressions(s) / ED Diagnoses   Final diagnoses:  Influenza-like illness    New Prescriptions New Prescriptions   BENZONATATE (TESSALON) 100 MG CAPSULE    Take 2 capsules (200 mg total) by mouth 2 (two) times daily as needed for cough.   ONDANSETRON (ZOFRAN ODT) 4 MG DISINTEGRATING TABLET    Take 1 tablet (4 mg total) by mouth every 8 (eight) hours as needed for nausea or vomiting.   OSELTAMIVIR (TAMIFLU) 75 MG CAPSULE    Take 1 capsule (75 mg total) by mouth every 12 (twelve) hours.     Roxy Horseman, PA-C 06/07/16 1610    Canary Brim Tegeler, MD 06/07/16 [phone]

## 2016-06-07 NOTE — ED Triage Notes (Signed)
Patient reports productive cough with chest congestion , chills and fatigue onset last week .

## 2016-10-04 ENCOUNTER — Emergency Department (HOSPITAL_COMMUNITY)
Admission: EM | Admit: 2016-10-04 | Discharge: 2016-10-04 | Disposition: A | Discharge status: Home / Self Care | Payer: Worker's Compensation | Attending: Emergency Medicine | Admitting: Emergency Medicine

## 2016-10-04 ENCOUNTER — Emergency Department (HOSPITAL_COMMUNITY): Payer: Worker's Compensation

## 2016-10-04 ENCOUNTER — Encounter (HOSPITAL_COMMUNITY): Payer: Self-pay

## 2016-10-04 DIAGNOSIS — F17210 Nicotine dependence, cigarettes, uncomplicated: Secondary | ICD-10-CM | POA: Insufficient documentation

## 2016-10-04 DIAGNOSIS — M5431 Sciatica, right side: Secondary | ICD-10-CM

## 2016-10-04 DIAGNOSIS — Z79899 Other long term (current) drug therapy: Secondary | ICD-10-CM | POA: Insufficient documentation

## 2016-10-04 DIAGNOSIS — M5441 Lumbago with sciatica, right side: Secondary | ICD-10-CM | POA: Insufficient documentation

## 2016-10-04 DIAGNOSIS — M545 Low back pain: Secondary | ICD-10-CM | POA: Diagnosis present

## 2016-10-04 MED ORDER — OXYCODONE-ACETAMINOPHEN 5-325 MG PO TABS: 1.0000 | ORAL_TABLET | ORAL | 0 refills | Status: DC | PRN

## 2016-10-04 MED ORDER — PREDNISONE 20 MG PO TABS: 40.0000 mg | ORAL_TABLET | Freq: Every day | ORAL | 0 refills | Status: DC

## 2016-10-04 MED ORDER — MORPHINE SULFATE (PF) 4 MG/ML IV SOLN
4.0000 mg | Freq: Once | INTRAVENOUS | Status: AC
  Administered 2016-10-04: 4 mg via INTRAVENOUS
  Filled 2016-10-04: qty 1

## 2016-10-04 MED ORDER — DIAZEPAM 5 MG PO TABS: 5.0000 mg | ORAL_TABLET | Freq: Two times a day (BID) | ORAL | 0 refills | Status: DC

## 2016-10-04 MED ORDER — PREDNISONE 20 MG PO TABS
60.0000 mg | ORAL_TABLET | Freq: Once | ORAL | Status: AC
  Administered 2016-10-04: 60 mg via ORAL
  Filled 2016-10-04: qty 3

## 2016-10-04 MED ORDER — DIAZEPAM 5 MG PO TABS
5.0000 mg | ORAL_TABLET | Freq: Once | ORAL | Status: AC
  Administered 2016-10-04: 5 mg via ORAL
  Filled 2016-10-04: qty 1

## 2016-10-04 MED ORDER — HYDROMORPHONE HCL 1 MG/ML IJ SOLN
1.0000 mg | Freq: Once | INTRAMUSCULAR | Status: AC
  Administered 2016-10-04: 1 mg via INTRAVENOUS
  Filled 2016-10-04: qty 1

## 2016-10-04 MED ORDER — ONDANSETRON HCL 4 MG/2ML IJ SOLN
4.0000 mg | Freq: Once | INTRAMUSCULAR | Status: AC
  Administered 2016-10-04: 4 mg via INTRAVENOUS
  Filled 2016-10-04: qty 2

## 2016-10-04 NOTE — ED Triage Notes (Signed)
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 He reports he has lumbar pain primarily radiating into his left leg. Hx of back surgery, last in 2007.No urinary or bowel incontinence.

## 2016-10-04 NOTE — ED Provider Notes (Signed)
[facility]PT Provider Note   CSN: [id number] Arrival date & time: 10/04/16  0945     History   Chief Complaint Chief Complaint  Patient presents with  . Back Pain    HPI Garrett FisherJames R Rodenbaugh III is a 46 y.o. male.  HPI   Pt is 46 yo male with PMH of L5-S1 fusion who presents the ED with complaint of low back pain, onset one week. Patient reports last week while he was laying down for a knee lifted a 90 pound roll and states he initially felt a twinge in his lower back. He notes throughout the week he has had worsening constant sharp burning pain to his right lower back that radiates down the back of his leg. He notes pain is worse with movement or ambulation. Endorses associated tingling to his right foot. Patient reports pain feels similar to packing he was having prior to having his lumbar fusion completed in 2007 by Dr. Jene EveryJeffrey Beane. Pt denies fever, numbness, tingling, saddle anesthesia, loss of bowel or bladder, weakness, CP, SOB, abdominal pain, N/V, urinary sxs, IVDU, cancer or recent spinal manipulation. Patient states he has been taking Aleve and ibuprofen at home with minimal intermittent relief. Denies any other recent fall, trauma or injury.  PCP- Dr. Andrey CampanileWilson   History reviewed. No pertinent past medical history.  There are no active problems to display for this patient.   Past Surgical History:  Procedure Laterality Date  . BACK SURGERY    . HIP SURGERY    . TONSILLECTOMY     46 years old       Home Medications    Prior to Admission medications   Medication Sig Start Date End Date Taking? Authorizing Provider  albuterol (PROVENTIL HFA;VENTOLIN HFA) 108 (90 Base) MCG/ACT inhaler Inhale 2 puffs into the lungs every 4 (four) hours as needed for wheezing or shortness of breath. 11/11/15  Yes Pollina, Canary Brimhristopher J, MD  Multiple Vitamin (MULTIVITAMIN) tablet Take 1 tablet by mouth daily.   Yes [provider]  naproxen sodium (ANAPROX) 220 MG tablet  Take 220 mg by mouth 2 (two) times daily as needed (pain).   Yes [provider]  benzonatate (TESSALON) 100 MG capsule Take 2 capsules (200 mg total) by mouth 2 (two) times daily as needed for cough. Patient not taking: Reported on 10/04/2016 06/07/16   Roxy HorsemanBrowning, Robert, PA-C  diazepam (VALIUM) 5 MG tablet Take 1 tablet (5 mg total) by mouth 2 (two) times daily. 10/04/16   Barrett HenleNadeau, Nicole Elizabeth, PA-C  levofloxacin (LEVAQUIN) 750 MG tablet Take 1 tablet (750 mg total) by mouth daily. Patient not taking: Reported on 10/04/2016 11/11/15   Gilda CreasePollina, Christopher J, MD  ondansetron (ZOFRAN ODT) 4 MG disintegrating tablet Take 1 tablet (4 mg total) by mouth every 8 (eight) hours as needed for nausea or vomiting. Patient not taking: Reported on 10/04/2016 06/07/16   Roxy HorsemanBrowning, Robert, PA-C  oseltamivir (TAMIFLU) 75 MG capsule Take 1 capsule (75 mg total) by mouth every 12 (twelve) hours. Patient not taking: Reported on 10/04/2016 06/07/16   Roxy HorsemanBrowning, Robert, PA-C  oxyCODONE-acetaminophen (PERCOCET/ROXICET) 5-325 MG tablet Take 1 tablet by mouth every 4 (four) hours as needed for severe pain. 10/04/16   Barrett HenleNadeau, Nicole Elizabeth, PA-C  predniSONE (DELTASONE) 20 MG tablet Take 2 tablets (40 mg total) by mouth daily. 10/04/16   Barrett HenleNadeau, Nicole Elizabeth, PA-C  traMADol (ULTRAM) 50 MG tablet Take 1 tablet (50 mg total) by mouth every 6 (six) hours as needed. Patient  not taking: Reported on 10/04/2016 12/03/13   Junius Finner, PA-C    Family History History reviewed. No pertinent family history.  Social History Social History  Substance Use Topics  . Smoking status: Current Every Day Smoker    Packs/day: 0.50    Types: Cigarettes  . Smokeless tobacco: Never Used  . Alcohol use Yes     Comment: socially     Allergies   Sulfa antibiotics   Review of Systems Review of Systems  Musculoskeletal: Positive for back pain.  Neurological:       Tingling  All other systems reviewed and are negative.    Physical  Exam Updated Vital Signs BP 118/68   Pulse (!) 50   Temp 97.7 F (36.5 C) (Oral)   Resp 18   SpO2 93%   Physical Exam  Constitutional: He is oriented to person, place, and time. He appears well-developed and well-nourished.  Pt appears to be in discomfort and moans in pain with movement of his back or right LE  HENT:  Head: Normocephalic and atraumatic.  Mouth/Throat: Oropharynx is clear and moist. No oropharyngeal exudate.  Eyes: Conjunctivae and EOM are normal. Right eye exhibits no discharge. Left eye exhibits no discharge. No scleral icterus.  Neck: Normal range of motion. Neck supple.  Cardiovascular: Normal rate, regular rhythm, normal heart sounds and intact distal pulses.   Pulmonary/Chest: Effort normal and breath sounds normal. No respiratory distress. He has no wheezes. He has no rales. He exhibits no tenderness.  Abdominal: Soft. Bowel sounds are normal. He exhibits no distension and no mass. There is no tenderness. There is no rebound and no guarding. No hernia.  Musculoskeletal: He exhibits tenderness. He exhibits no edema or deformity.  No midline C, T tenderness. TTP over midline lumbar spine, right buttocks and right lateral thigh. Full range of motion of neck and dec ROM of back due to pain. Full range of motion of bilateral upper and lower extremities, with 5/5 strength. Pt reports worsening pain with ROM of right leg. Sensation grossly intact. 2+ radial and PT pulses. Cap refill <2 seconds. Positive right straight leg raise.   Neurological: He is alert and oriented to person, place, and time. He has normal strength. No sensory deficit.  Reflex Scores:      Patellar reflexes are 1+ on the right side and 2+ on the left side. Foot drop present to right side, pt able to actively dorsiflex right foot appx. 45 degrees which he reports is chronic and unchanged since prior back surgery. Pt also reports decreased reflex on right knee is chronic and unchanged since back surgery. Pt  able to stand and ambulate.  Skin: Skin is warm and dry.  Nursing note and vitals reviewed.    ED Treatments / Results  Labs (all labs ordered are listed, but only abnormal results are displayed) Labs Reviewed - No data to display  EKG  EKG Interpretation None       Radiology Dg Lumbar Spine Complete  Result Date: 10/04/2016 CLINICAL DATA:  Low back pain for 3 days. EXAM: LUMBAR SPINE - COMPLETE 4+ VIEW COMPARISON:  CT 01/01/2009 FINDINGS: Anterior interbody fusion at L5-S1 appears unchanged. Mild degenerative endplate changes. Endplate disease is most prominent at T11-T12. The vertebral body heights are maintained. Negative for a pars defect. Alignment of the lumbar spine is within normal limits. IMPRESSION: No acute abnormality. Mild degenerative changes. Stable postoperative changes at L5-S1. Electronically Signed   By: Meriel Pica.D.  On: 10/04/2016 12:39    Procedures Procedures (including critical care time)  Medications Ordered in ED Medications  predniSONE (DELTASONE) tablet 60 mg (not administered)  morphine 4 MG/ML injection 4 mg (4 mg Intravenous Given 10/04/16 1146)  ondansetron (ZOFRAN) injection 4 mg (4 mg Intravenous Given 10/04/16 1146)  diazepam (VALIUM) tablet 5 mg (5 mg Oral Given 10/04/16 1145)  HYDROmorphone (DILAUDID) injection 1 mg (1 mg Intravenous Given 10/04/16 1300)     Initial Impression / Assessment and Plan / ED Course  I have reviewed the triage vital signs and the nursing notes.  Pertinent labs & imaging results that were available during my care of the patient were reviewed by me and considered in my medical decision making (see chart for details).    Pt presents with right lower back pain radiating to right leg for the past week after lifting a heavy object at work. Hx of lumbar fusion in 2007. No back pain red flags. VSS. Exam revealed TTP over midline lumbar spine, dec ROM of back due to reported pain. BLE neurovascularly intact with TTP over  right buttocks and right lateral thigh with FROM and strength. Pt given pain meds and muscle relaxant. Lumbar xray showed no acute abnormality, mild degenerative changes, stable postop changes at L5-S1. On reevaluation pt reports significant improvement of sxs. Pt able to stand and ambulate. Suspect sxs are related to right sided sciatica. Do not suspect cauda equina syndrome or epidural abscess warranting further imaging at this time. Plan to d/c pt home with pain meds, muscle relaxant, and steroids. Advised to follow up with orthopedist this week for follow up and further evaluation. Discussed return precautions.   Final Clinical Impressions(s) / ED Diagnoses   Final diagnoses:  Sciatica of right side    New Prescriptions New Prescriptions   DIAZEPAM (VALIUM) 5 MG TABLET    Take 1 tablet (5 mg total) by mouth 2 (two) times daily.   OXYCODONE-ACETAMINOPHEN (PERCOCET/ROXICET) 5-325 MG TABLET    Take 1 tablet by mouth every 4 (four) hours as needed for severe pain.   PREDNISONE (DELTASONE) 20 MG TABLET    Take 2 tablets (40 mg total) by mouth daily.     Barrett Henle, PA-C 10/04/16 1358    Alvira Monday, MD 10/06/16 1757

## 2016-10-04 NOTE — Discharge Instructions (Signed)
Take your medication as prescribed. You may also apply ice and/or heat to affected area for 15-20 minutes 3-4 times daily as needed for additional pain relief. I recommend refraining from doing any heavy lifting, squatting or repetitive movements that exacerbate your pain for the next week. I recommend following up with your orthopedist if your pain has not improved over the next 4-5 days. Please return to the Emergency Department if symptoms worsen or new onset of fever, numbness, groin anesthesia, loss of bowel or bladder, weakness, chest pain, abdominal pain, unable to walk.

## 2016-10-17 ENCOUNTER — Emergency Department (HOSPITAL_COMMUNITY): Payer: Worker's Compensation

## 2016-10-17 ENCOUNTER — Encounter (HOSPITAL_COMMUNITY): Payer: Self-pay | Admitting: Emergency Medicine

## 2016-10-17 ENCOUNTER — Emergency Department (HOSPITAL_COMMUNITY)
Admission: EM | Admit: 2016-10-17 | Discharge: 2016-10-17 | Disposition: A | Discharge status: Home / Self Care | Payer: Worker's Compensation | Attending: Emergency Medicine | Admitting: Emergency Medicine

## 2016-10-17 DIAGNOSIS — Z79899 Other long term (current) drug therapy: Secondary | ICD-10-CM | POA: Insufficient documentation

## 2016-10-17 DIAGNOSIS — M545 Low back pain, unspecified: Secondary | ICD-10-CM

## 2016-10-17 DIAGNOSIS — F17210 Nicotine dependence, cigarettes, uncomplicated: Secondary | ICD-10-CM | POA: Insufficient documentation

## 2016-10-17 DIAGNOSIS — M5116 Intervertebral disc disorders with radiculopathy, lumbar region: Secondary | ICD-10-CM | POA: Diagnosis not present

## 2016-10-17 MED ORDER — OXYCODONE-ACETAMINOPHEN 5-325 MG PO TABS: 1.0000 | ORAL_TABLET | Freq: Four times a day (QID) | ORAL | 0 refills | Status: DC | PRN

## 2016-10-17 MED ORDER — KETOROLAC TROMETHAMINE 30 MG/ML IJ SOLN
30.0000 mg | Freq: Once | INTRAMUSCULAR | Status: AC
  Administered 2016-10-17: 30 mg via INTRAVENOUS
  Filled 2016-10-17: qty 1

## 2016-10-17 MED ORDER — HYDROMORPHONE HCL 1 MG/ML IJ SOLN
1.0000 mg | Freq: Once | INTRAMUSCULAR | Status: AC
  Administered 2016-10-17: 1 mg via INTRAVENOUS
  Filled 2016-10-17: qty 1

## 2016-10-17 MED ORDER — PREDNISONE 20 MG PO TABS: 60.0000 mg | ORAL_TABLET | Freq: Every day | ORAL | 0 refills | Status: DC

## 2016-10-17 MED ORDER — METHOCARBAMOL 500 MG PO TABS: 500.0000 mg | ORAL_TABLET | Freq: Three times a day (TID) | ORAL | 0 refills | Status: DC | PRN

## 2016-10-17 NOTE — ED Triage Notes (Signed)
Pt sts lower back pain with radiation to right leg from sciatica; pt sts hx of same

## 2016-10-17 NOTE — ED Notes (Signed)
Pt states he has hx of sciatica.  Was seen here 1.5 week ago.  Today he was at work and right leg gave out on him.  Has fusion L3 to pelvis.  Patient states he is in severe pain.

## 2016-10-17 NOTE — ED Provider Notes (Signed)
[facility]-EMERGENCY DEPT Provider Note   CSN: [id number] Arrival date & time: 10/17/16  1111  By signing my name below, I, Sonum Patel, attest that this documentation has been prepared under the direction and in the presence of Benjiman CorePickering, Jodeen Mclin, MD. Electronically Signed: Leone PayorSonum Patel, Scribe. 10/17/16. 1:31 PM.  History   Chief Complaint Chief Complaint  Patient presents with  . Back Pain   The history is provided by the patient. No language interpreter was used.    HPI Comments: Garrett FisherJames R Banner Chen is a 46 y.o. male with past medical history of L5-S1 fusion who presents to the Emergency Department complaining of 3 weeks of persistent, gradually worsening lower back pain with radiation down the posterior RLE. He states this initially started after lifting a 90 pound roll at work. He was seen in the ED on 10/04/16 and was diagnosed with sciatica on the right side. He was discharged home with pain medication, muscle relaxant, and steroids. He states these medications have helped the muscle spasms but the pain continues and now his right leg buckled today. He has taken Bayer back and body with some relief of pain but states it has caused an upset stomach. Patient reports this pain feels similar to the pain he had prior to the lumbar fusion completed in 2007 by Dr. Jene EveryJeffrey Beane. He states at baseline he had numbness to the right 3rd-5th toes and paresthesia to the lateral right lower leg since his surgery. He states now the lower lateral right leg has a burning type pain. He reports having a right foot drop which has also been since the surgery. He has an appointment with Dr. Jillyn HiddenBean but is unsure of the date. He denies bowel/bladder incontinence, constipation.   PCP- Dr. Andrey CampanileWilson  History reviewed. No pertinent past medical history.  There are no active problems to display for this patient.   Past Surgical History:  Procedure Laterality Date  . BACK SURGERY    . HIP SURGERY    . TONSILLECTOMY     46 years old       Home Medications    Prior to Admission medications   Medication Sig Start Date End Date Taking? Authorizing Provider  albuterol (PROVENTIL HFA;VENTOLIN HFA) 108 (90 Base) MCG/ACT inhaler Inhale 2 puffs into the lungs every 4 (four) hours as needed for wheezing or shortness of breath. 11/11/15   Gilda CreasePollina, Christopher J, MD  benzonatate (TESSALON) 100 MG capsule Take 2 capsules (200 mg total) by mouth 2 (two) times daily as needed for cough. Patient not taking: Reported on 10/04/2016 06/07/16   Roxy HorsemanBrowning, Robert, PA-C  diazepam (VALIUM) 5 MG tablet Take 1 tablet (5 mg total) by mouth 2 (two) times daily. 10/04/16   Barrett HenleNadeau, Nicole Elizabeth, PA-C  levofloxacin (LEVAQUIN) 750 MG tablet Take 1 tablet (750 mg total) by mouth daily. Patient not taking: Reported on 10/04/2016 11/11/15   Gilda CreasePollina, Christopher J, MD  methocarbamol (ROBAXIN) 500 MG tablet Take 1 tablet (500 mg total) by mouth every 8 (eight) hours as needed for muscle spasms. 10/17/16   Benjiman CorePickering, Trystin Hargrove, MD  Multiple Vitamin (MULTIVITAMIN) tablet Take 1 tablet by mouth daily.    [provider]  naproxen sodium (ANAPROX) 220 MG tablet Take 220 mg by mouth 2 (two) times daily as needed (pain).    [provider]  ondansetron (ZOFRAN ODT) 4 MG disintegrating tablet Take 1 tablet (4 mg total) by mouth every 8 (eight) hours as needed for nausea or vomiting. Patient not taking: Reported  on 10/04/2016 06/07/16   Roxy Horseman, PA-C  oseltamivir (TAMIFLU) 75 MG capsule Take 1 capsule (75 mg total) by mouth every 12 (twelve) hours. Patient not taking: Reported on 10/04/2016 06/07/16   Roxy Horseman, PA-C  oxyCODONE-acetaminophen (PERCOCET/ROXICET) 5-325 MG tablet Take 1-2 tablets by mouth every 6 (six) hours as needed for severe pain. 10/17/16   Benjiman Core, MD  predniSONE (DELTASONE) 20 MG tablet Take 3 tablets (60 mg total) by mouth daily. 10/17/16   Benjiman Core, MD  traMADol (ULTRAM) 50 MG tablet Take 1  tablet (50 mg total) by mouth every 6 (six) hours as needed. Patient not taking: Reported on 10/04/2016 12/03/13   Junius Finner, PA-C    Family History History reviewed. No pertinent family history.  Social History Social History  Substance Use Topics  . Smoking status: Current Every Day Smoker    Packs/day: 0.50    Types: Cigarettes  . Smokeless tobacco: Never Used  . Alcohol use Yes     Comment: socially     Allergies   Sulfa antibiotics   Review of Systems Review of Systems  Gastrointestinal: Negative for constipation.  Musculoskeletal: Positive for back pain.  Neurological: Positive for numbness.  All other systems reviewed and are negative.    Physical Exam Updated Vital Signs BP 128/77   Pulse (!) 101   Temp 98 F (36.7 C) (Oral)   Resp 16   SpO2 98%   Physical Exam  Constitutional: He is oriented to person, place, and time. He appears well-developed and well-nourished.  Uncomfortable appearing  HENT:  Head: Normocephalic and atraumatic.  Eyes: EOM are normal.  Neck: Normal range of motion.  Cardiovascular: Normal rate, regular rhythm, normal heart sounds and intact distal pulses.   Pulmonary/Chest: Effort normal and breath sounds normal. No respiratory distress.  Abdominal: Soft. He exhibits no distension. There is no tenderness.  Musculoskeletal: He exhibits tenderness. He exhibits no deformity.  Moderate to severe right lower back pain with tenderness over the right SI joint. Decreased SLR, decreased dorsiflexion on the right.   Neurological: He is alert and oriented to person, place, and time.  Paresthesia to right lateral leg up to proximal knee. Perineal sensation intact.   Skin: Skin is warm and dry.  Psychiatric: He has a normal mood and affect. Judgment normal.  Nursing note and vitals reviewed.    ED Treatments / Results    COORDINATION OF CARE: 12:25 PM Discussed treatment plan with pt at bedside and pt agreed to plan.   Labs (all  labs ordered are listed, but only abnormal results are displayed) Labs Reviewed - No data to display  EKG  EKG Interpretation None       Radiology Mr Lumbar Spine Wo Contrast  Result Date: 10/17/2016 CLINICAL DATA:  RIGHT leg gave out at work today, severe back pain. Eight prior back surgeries. Seen 1-1/2 weeks ago for similar symptoms. EXAM: MRI LUMBAR SPINE WITHOUT CONTRAST TECHNIQUE: Multiplanar, multisequence MR imaging of the lumbar spine was performed. No intravenous contrast was administered. COMPARISON:  Lumbar spine radiographs Oct 04, 2016 and CT abdomen and pelvis January 01, 2009 FINDINGS: SEGMENTATION: For the purposes of this report, the last well-formed intervertebral disc will be described as L5-S1. ALIGNMENT: Maintenance of the lumbar lordosis. No malalignment. VERTEBRAE:Vertebral bodies are intact. L5-S1 ALIF, hardware results in susceptibility artifact anteriorly. Non surgically altered discs demonstrate normal morphology and signal on sagittal imaging. No abnormal or acute bone marrow signal. CONUS MEDULLARIS: Conus medullaris terminates at L1-2  and demonstrates normal morphology and signal characteristics. Cauda equina is normal. PARASPINAL AND SOFT TISSUES: Included prevertebral and paraspinal soft tissues are normal. DISC LEVELS: L1-2 and L2-3: No disc bulge, canal stenosis nor neural foraminal narrowing. L3-4: No disc bulge, canal stenosis nor neural foraminal narrowing. Mild facet arthropathy. L4-5: Small broad-based disc bulge, small RIGHT subarticular disc protrusion. Slightly thickened and indistinct exited RIGHT L4 nerve (axial 22/36). Mild facet arthropathy and ligamentum flavum redundancy without canal stenosis. Moderate RIGHT, minimal LEFT neural foraminal narrowing. L5-S1: Interbody fusion. RIGHT laminectomy. Mild facet arthropathy. No disc bulge, canal stenosis or neural foraminal narrowing. IMPRESSION: Small L4-5 subarticular disc protrusion resulting in moderate RIGHT  L4-5 neural foraminal narrowing. Suspected RIGHT L4 neuritis. Status post L5-S1 PLIF and RIGHT hemilaminectomy. No canal stenosis. Electronically Signed   By: Awilda Metro M.D.   On: 10/17/2016 14:43    Procedures Procedures (including critical care time)  Medications Ordered in ED Medications  ketorolac (TORADOL) 30 MG/ML injection 30 mg (30 mg Intravenous Given 10/17/16 1246)  HYDROmorphone (DILAUDID) injection 1 mg (1 mg Intravenous Given 10/17/16 1246)  HYDROmorphone (DILAUDID) injection 1 mg (1 mg Intravenous Given 10/17/16 1321)  HYDROmorphone (DILAUDID) injection 1 mg (1 mg Intravenous Given 10/17/16 1544)     Initial Impression / Assessment and Plan / ED Course  I have reviewed the triage vital signs and the nursing notes.  Pertinent labs & imaging results that were available during my care of the patient were reviewed by me and considered in my medical decision making (see chart for details).     Patient with back pain. Seen a week and half ago for the same. Previous lumbar fusion by Dr. Shelle Iron. MRI does show some disc disease with nerve irritation. We'll give more steroids and have follow-up with Dr. Shelle Iron. I was unable to get a hold of him today after calling the office.   Final Clinical Impressions(s) / ED Diagnoses   Final diagnoses:  Low back pain  Lumbar disc herniation with radiculopathy    New Prescriptions Discharge Medication List as of 10/17/2016  3:52 PM    START taking these medications   Details  methocarbamol (ROBAXIN) 500 MG tablet Take 1 tablet (500 mg total) by mouth every 8 (eight) hours as needed for muscle spasms., Starting Mon 10/17/2016, Print       I personally performed the services described in this documentation, which was scribed in my presence. The recorded information has been reviewed and is accurate.      Benjiman Core, MD 10/17/16 2034

## 2016-10-17 NOTE — ED Notes (Signed)
Pt in MRI.  MRI called and states that patient is in so much pain that he can not lay flat for study. EDP made aware.

## 2016-10-17 NOTE — ED Notes (Signed)
On way to MRI 

## 2016-11-28 ENCOUNTER — Encounter (HOSPITAL_COMMUNITY): Payer: Self-pay

## 2016-11-28 ENCOUNTER — Emergency Department (HOSPITAL_COMMUNITY): Payer: Self-pay

## 2016-11-28 ENCOUNTER — Emergency Department (HOSPITAL_COMMUNITY)
Admission: EM | Admit: 2016-11-28 | Discharge: 2016-11-28 | Disposition: A | Discharge status: Home / Self Care | Payer: Self-pay | Attending: Emergency Medicine | Admitting: Emergency Medicine

## 2016-11-28 DIAGNOSIS — R509 Fever, unspecified: Secondary | ICD-10-CM

## 2016-11-28 DIAGNOSIS — A770 Spotted fever due to Rickettsia rickettsii: Secondary | ICD-10-CM | POA: Insufficient documentation

## 2016-11-28 DIAGNOSIS — S20369A Insect bite (nonvenomous) of unspecified front wall of thorax, initial encounter: Secondary | ICD-10-CM | POA: Insufficient documentation

## 2016-11-28 DIAGNOSIS — Y929 Unspecified place or not applicable: Secondary | ICD-10-CM | POA: Insufficient documentation

## 2016-11-28 DIAGNOSIS — Y939 Activity, unspecified: Secondary | ICD-10-CM | POA: Insufficient documentation

## 2016-11-28 DIAGNOSIS — Y999 Unspecified external cause status: Secondary | ICD-10-CM | POA: Insufficient documentation

## 2016-11-28 DIAGNOSIS — W57XXXA Bitten or stung by nonvenomous insect and other nonvenomous arthropods, initial encounter: Secondary | ICD-10-CM | POA: Insufficient documentation

## 2016-11-28 DIAGNOSIS — F17210 Nicotine dependence, cigarettes, uncomplicated: Secondary | ICD-10-CM | POA: Insufficient documentation

## 2016-11-28 DIAGNOSIS — R21 Rash and other nonspecific skin eruption: Secondary | ICD-10-CM

## 2016-11-28 DIAGNOSIS — Z79899 Other long term (current) drug therapy: Secondary | ICD-10-CM | POA: Insufficient documentation

## 2016-11-28 LAB — HEPATIC FUNCTION PANEL
ALK PHOS: 68 U/L (ref 38–126)
ALT: 59 U/L (ref 17–63)
AST: 51 U/L — ABNORMAL HIGH (ref 15–41)
Albumin: 4.2 g/dL (ref 3.5–5.0)
BILIRUBIN DIRECT: 0.2 mg/dL (ref 0.1–0.5)
BILIRUBIN INDIRECT: 0.6 mg/dL (ref 0.3–0.9)
BILIRUBIN TOTAL: 0.8 mg/dL (ref 0.3–1.2)
Total Protein: 6.9 g/dL (ref 6.5–8.1)

## 2016-11-28 LAB — BASIC METABOLIC PANEL
Anion gap: 10 (ref 5–15)
BUN: 11 mg/dL (ref 6–20)
CHLORIDE: 105 mmol/L (ref 101–111)
CO2: 24 mmol/L (ref 22–32)
CREATININE: 1.19 mg/dL (ref 0.61–1.24)
Calcium: 9.6 mg/dL (ref 8.9–10.3)
GFR calc non Af Amer: >60 mL/min (ref >60)
GLUCOSE: 117 mg/dL — AB (ref 65–99)
Potassium: 3.7 mmol/L (ref 3.5–5.1)
Sodium: 139 mmol/L (ref 135–145)

## 2016-11-28 LAB — CBC
HCT: 47 % (ref 39.0–52.0)
Hemoglobin: 16.2 g/dL (ref 13.0–17.0)
MCH: 32.3 pg (ref 26.0–34.0)
MCHC: 34.5 g/dL (ref 30.0–36.0)
MCV: 93.8 fL (ref 78.0–100.0)
Platelets: 150 10*3/uL (ref 150–400)
RBC: 5.01 MIL/uL (ref 4.22–5.81)
RDW: 13.2 % (ref 11.5–15.5)
WBC: 8.2 10*3/uL (ref 4.0–10.5)

## 2016-11-28 LAB — URINALYSIS, ROUTINE W REFLEX MICROSCOPIC
BILIRUBIN URINE: NEGATIVE
GLUCOSE, UA: NEGATIVE mg/dL
HGB URINE DIPSTICK: NEGATIVE
Ketones, ur: NEGATIVE mg/dL
Leukocytes, UA: NEGATIVE
Nitrite: NEGATIVE
Protein, ur: NEGATIVE mg/dL
SPECIFIC GRAVITY, URINE: 1.021 (ref 1.005–1.030)
pH: 6 (ref 5.0–8.0)

## 2016-11-28 LAB — URINALYSIS, MICROSCOPIC (REFLEX)
Bacteria, UA: NONE SEEN
Squamous Epithelial / LPF: NONE SEEN

## 2016-11-28 MED ORDER — DOXYCYCLINE HYCLATE 50 MG PO CAPS: 50.0000 mg | ORAL_CAPSULE | Freq: Two times a day (BID) | ORAL | 0 refills | Status: AC

## 2016-11-28 NOTE — ED Notes (Signed)
Reviewed ED D/C with pt. No questions or concerns. Pt signed and ambulated out of ED.

## 2016-11-28 NOTE — ED Notes (Signed)
Patient to xray.

## 2016-11-28 NOTE — Discharge Instructions (Signed)
Please take the antibiotics to treat the infection. Please stay hydrated. Please follow-up with your primary care physician. If any symptoms change or worsen, please return to the nearest emergency department.

## 2016-11-28 NOTE — ED Provider Notes (Signed)
[facility]EMER[facility]Y DEPT Provider Note   CSN: [id number] Arrival date & time: 11/28/16  [id number]     History   Chief Complaint Chief Complaint  Patient presents with  . Rash    HPI [name] is a 46 y.o. male.  The history is provided by the patient and medical records.  Rash   This is a new problem. The current episode started 2 days ago. The problem has not changed since onset.The problem is associated with an insect bite/sting. The maximum temperature recorded prior to his arrival was 101 to 101.9 F. The fever has been present for 1 to 2 days. The rash is present on the torso. The pain is at a severity of 0/10. The patient is experiencing no pain. The pain has been constant since onset. Pertinent negatives include no blisters, no itching and no pain. He has tried nothing for the symptoms. The treatment provided no relief.    History reviewed. No pertinent past medical history.  There are no active problems to display for this patient.   Past Surgical History:  Procedure Laterality Date  . BACK SURGERY    . HIP SURGERY    . TONSILLECTOMY     46 years old       Home Medications    Prior to Admission medications   Medication Sig Start Date End Date Taking? Authorizing Provider  albuterol (PROVENTIL HFA;VENTOLIN HFA) 108 (90 Base) MCG/ACT inhaler Inhale 2 puffs into the lungs every 4 (four) hours as needed for wheezing or shortness of breath. 11/11/15   Gilda CreasePollina, Noreen Mackintosh J, MD  benzonatate (TESSALON) 100 MG capsule Take 2 capsules (200 mg total) by mouth 2 (two) times daily as needed for cough. Patient not taking: Reported on 10/04/2016 06/07/16   Roxy HorsemanBrowning, Robert, PA-C  diazepam (VALIUM) 5 MG tablet Take 1 tablet (5 mg total) by mouth 2 (two) times daily. 10/04/16   Barrett HenleNadeau, Nicole Elizabeth, PA-C  levofloxacin (LEVAQUIN) 750 MG tablet Take 1 tablet (750 mg total) by mouth daily. Patient not taking: Reported on 10/04/2016 11/11/15   Gilda CreasePollina, Irie Dowson J, MD  methocarbamol  (ROBAXIN) 500 MG tablet Take 1 tablet (500 mg total) by mouth every 8 (eight) hours as needed for muscle spasms. 10/17/16   Benjiman CorePickering, Nathan, MD  Multiple Vitamin (MULTIVITAMIN) tablet Take 1 tablet by mouth daily.    [provider]  naproxen sodium (ANAPROX) 220 MG tablet Take 220 mg by mouth 2 (two) times daily as needed (pain).    [provider]  ondansetron (ZOFRAN ODT) 4 MG disintegrating tablet Take 1 tablet (4 mg total) by mouth every 8 (eight) hours as needed for nausea or vomiting. Patient not taking: Reported on 10/04/2016 06/07/16   Roxy HorsemanBrowning, Robert, PA-C  oseltamivir (TAMIFLU) 75 MG capsule Take 1 capsule (75 mg total) by mouth every 12 (twelve) hours. Patient not taking: Reported on 10/04/2016 06/07/16   Roxy HorsemanBrowning, Robert, PA-C  oxyCODONE-acetaminophen (PERCOCET/ROXICET) 5-325 MG tablet Take 1-2 tablets by mouth every 6 (six) hours as needed for severe pain. 10/17/16   Benjiman CorePickering, Nathan, MD  predniSONE (DELTASONE) 20 MG tablet Take 3 tablets (60 mg total) by mouth daily. 10/17/16   Benjiman CorePickering, Nathan, MD  traMADol (ULTRAM) 50 MG tablet Take 1 tablet (50 mg total) by mouth every 6 (six) hours as needed. Patient not taking: Reported on 10/04/2016 12/03/13   Rolla PlatePhelps, Erin O, PA-C    Family History No family history on file.  Social History Social History  Substance Use Topics  .  Smoking status: Current Every Day Smoker    Packs/day: 0.50    Types: Cigarettes  . Smokeless tobacco: Never Used  . Alcohol use Yes     Comment: socially     Allergies   Sulfa antibiotics   Review of Systems Review of Systems  Constitutional: Positive for chills and fever. Negative for appetite change, diaphoresis and fatigue.  HENT: Negative for congestion.   Eyes: Negative for photophobia and visual disturbance.  Respiratory: Negative for cough, chest tightness, shortness of breath, wheezing and stridor.   Cardiovascular: Negative for chest pain and palpitations.  Gastrointestinal:  Positive for nausea. Negative for abdominal pain, constipation, diarrhea and vomiting.  Genitourinary: Negative for dysuria, flank pain and frequency.  Musculoskeletal: Negative for back pain, neck pain and neck stiffness.  Skin: Positive for rash. Negative for itching and wound.  Neurological: Negative for dizziness, facial asymmetry, light-headedness and headaches.  Psychiatric/Behavioral: Negative for agitation.  All other systems reviewed and are negative.    Physical Exam Updated Vital Signs BP 139/85 (BP Location: Right Arm)   Pulse 69   Temp 98.4 F (36.9 C) (Oral)   Resp 20   Ht 6' (1.829 m)   Wt 107 kg (236 lb)   SpO2 99%   BMI 32.01 kg/m   Physical Exam  Constitutional: He appears well-developed and well-nourished. No distress.  HENT:  Head: Normocephalic and atraumatic.  Mouth/Throat: Oropharynx is clear and moist. No oropharyngeal exudate.  Eyes: Conjunctivae and EOM are normal. Pupils are equal, round, and reactive to light.  Neck: Normal range of motion. Neck supple.  Cardiovascular: Normal rate, regular rhythm and intact distal pulses.   No murmur heard. Pulmonary/Chest: Effort normal and breath sounds normal. No stridor. No respiratory distress. He has no rales. He exhibits no tenderness.  Abdominal: Soft. There is no tenderness.  Musculoskeletal: He exhibits no edema or tenderness.  Neurological: He is alert. No cranial nerve deficit or sensory deficit. He exhibits normal muscle tone. Coordination normal.  Skin: Skin is warm and dry. Capillary refill takes less than 2 seconds. Rash noted. Rash is macular and maculopapular. He is not diaphoretic.  Psychiatric: He has a normal mood and affect.  Nursing note and vitals reviewed.    ED Treatments / Results  Labs (all labs ordered are listed, but only abnormal results are displayed) Labs Reviewed  BASIC METABOLIC PANEL - Abnormal; Notable for the following:       Result Value   Glucose, Bld 117 (*)    All  other components within normal limits  URINALYSIS, ROUTINE W REFLEX MICROSCOPIC - Abnormal; Notable for the following:    Color, Urine YELLOW (*)    All other components within normal limits  HEPATIC FUNCTION PANEL - Abnormal; Notable for the following:    AST 51 (*)    All other components within normal limits  CBC  URINALYSIS, MICROSCOPIC (REFLEX)    EKG  EKG Interpretation None       Radiology Dg Chest 2 View  Result Date: 11/28/2016 CLINICAL DATA:  Nausea and generalized rash starting 2 weeks ago. EXAM: CHEST  2 VIEW COMPARISON:  June 07, 2016 FINDINGS: The heart size and mediastinal contours are within normal limits. Both lungs are clear. The visualized skeletal structures are unremarkable. IMPRESSION: No active cardiopulmonary disease. Electronically Signed   By: Sherian Rein M.D.   On: 11/28/2016 15:47    Procedures Procedures (including critical care time)  Medications Ordered in ED Medications - No data to display  Initial Impression / Assessment and Plan / ED Course  I have reviewed the triage vital signs and the nursing notes.  Pertinent labs & imaging results that were available during my care of the patient were reviewed by me and considered in my medical decision making (see chart for details).     [name] is a 46 y.o. male with visiting a past medical history who presents with chills, fever, rash and malaise. 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 He says that over the last few days he began having symptoms including fever 101, generalized aching and malaise, and developed a rash. He describes the rash is all over his body. Patient has no other complaints on arrival. No upper respiratory symptoms or urinary symptoms.  History and exam are seen above. Patient has diffuse maculopapular rash all over body. No involvement in oropharyngeal exam. Lungs are clear. Abdomen and chest are  nontender. No focal neurologic deficits.  Patient had laboratory testing to look for significant abnormalities. X-ray and urine were checked showing no evidence of pneumonia or UTI.  Suspect [facility] spotted fever given the known tick exposures.  Patient was observed for a period time with no acute worsening of condition. Patient felt to be stable for discharge with doxycycline.  Patient instructed to follow-up with PCP for further management. Patient understood return precautions for any new or worsened symptoms. Patient had no other questions or concerns and was discharged in good condition.   Final Clinical Impressions(s) / ED Diagnoses   Final diagnoses:  Rash  Tick bite, initial encounter  Fever, unspecified fever cause  RMSF [facility] spotted fever)    New Prescriptions Discharge Medication List as of 11/28/2016  4:29 PM    START taking these medications   Details  doxycycline (VIBRAMYCIN) 50 MG capsule Take 1 capsule (50 mg total) by mouth 2 (two) times daily., Starting Mon 11/28/2016, Until Thu 12/08/2016, Print        Clinical Impression: 1. Rash   2. Tick bite, initial encounter   3. Fever, unspecified fever cause   4. RMSF [facility] spotted fever)     Disposition: Discharge  Condition: Good  I have discussed the results, Dx and Tx plan with the pt(& family if present). He/she/they expressed understanding and agree(s) with the plan. Discharge instructions discussed at great length. Strict return precautions discussed and pt &/or family have verbalized understanding of the instructions. No further questions at time of discharge.    Discharge Medication List as of 11/28/2016  4:29 PM    START taking these medications   Details  doxycycline (VIBRAMYCIN) 50 MG capsule Take 1 capsule (50 mg total) by mouth 2 (two) times daily., Starting Mon 11/28/2016, Until Thu 12/08/2016, Print        Follow Up: Barbie Banner, MD [address] [place]  [address]  Schedule an appointment as soon as possible for a visit    [name] EMERGENCY DEPARTMENT [address] [id number] mc [place] [address] [phone]  If symptoms worsen     Moana Munford, Canary Brim, MD 11/28/16 1859

## 2016-11-28 NOTE — ED Triage Notes (Signed)
Per Pt, Pt is coming from home with complaints of nausea, dizziness along with generalized rash that has started two weeks ago. 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 Pt alert and oriented x4 upon arrival and has neuro intact.

## 2016-12-01 ENCOUNTER — Encounter (HOSPITAL_COMMUNITY): Payer: Self-pay | Admitting: Emergency Medicine

## 2016-12-01 ENCOUNTER — Emergency Department (HOSPITAL_COMMUNITY)
Admission: EM | Admit: 2016-12-01 | Discharge: 2016-12-01 | Disposition: A | Discharge status: Home / Self Care | Payer: Self-pay | Attending: Emergency Medicine | Admitting: Emergency Medicine

## 2016-12-01 DIAGNOSIS — A770 Spotted fever due to Rickettsia rickettsii: Secondary | ICD-10-CM | POA: Insufficient documentation

## 2016-12-01 DIAGNOSIS — F17210 Nicotine dependence, cigarettes, uncomplicated: Secondary | ICD-10-CM | POA: Insufficient documentation

## 2016-12-01 LAB — COMPREHENSIVE METABOLIC PANEL
ALBUMIN: 4.3 g/dL (ref 3.5–5.0)
ALT: 58 U/L (ref 17–63)
ANION GAP: 9 (ref 5–15)
AST: 56 U/L — ABNORMAL HIGH (ref 15–41)
Alkaline Phosphatase: 68 U/L (ref 38–126)
BILIRUBIN TOTAL: 0.7 mg/dL (ref 0.3–1.2)
BUN: 9 mg/dL (ref 6–20)
CO2: 25 mmol/L (ref 22–32)
Calcium: 9.4 mg/dL (ref 8.9–10.3)
Chloride: 104 mmol/L (ref 101–111)
Creatinine, Ser: 1.11 mg/dL (ref 0.61–1.24)
GFR calc non Af Amer: >60 mL/min (ref >60)
GLUCOSE: 104 mg/dL — AB (ref 65–99)
POTASSIUM: 3.8 mmol/L (ref 3.5–5.1)
SODIUM: 138 mmol/L (ref 135–145)
Total Protein: 7.1 g/dL (ref 6.5–8.1)

## 2016-12-01 LAB — CBC
HEMATOCRIT: 49.2 % (ref 39.0–52.0)
HEMOGLOBIN: 16.1 g/dL (ref 13.0–17.0)
MCH: 31.3 pg (ref 26.0–34.0)
MCHC: 32.7 g/dL (ref 30.0–36.0)
MCV: 95.5 fL (ref 78.0–100.0)
Platelets: 170 10*3/uL (ref 150–400)
RBC: 5.15 MIL/uL (ref 4.22–5.81)
RDW: 13.4 % (ref 11.5–15.5)
WBC: 8.9 10*3/uL (ref 4.0–10.5)

## 2016-12-01 LAB — LIPASE, BLOOD: Lipase: 29 U/L (ref 11–51)

## 2016-12-01 NOTE — Discharge Instructions (Signed)
Please read attached information. If you experience any new or worsening signs or symptoms please return to the emergency room for evaluation. Please follow-up with your primary care provider or specialist as discussed.  °

## 2016-12-01 NOTE — ED Triage Notes (Signed)
PT reports the bite site  Located  Lt axilla drained today.

## 2016-12-01 NOTE — ED Triage Notes (Signed)
Pt was diagnosed here three days ago with [place] spotted fever from tick bite. Pt was rash around left arm pit tick bite with rash all over upper torso. Pt states he has been having chills and generalized body aches.

## 2016-12-01 NOTE — ED Triage Notes (Signed)
Pt reports he feels fatigue today.

## 2016-12-01 NOTE — ED Provider Notes (Signed)
[facility]-EMERGENCY DEPT Provider Note   CSN: [id number] Arrival date & time: 12/01/16  1611  By signing my name below, I, Linna Darner, attest that this documentation has been prepared under the direction and in the presence of [employer], PA-C. Electronically Signed: Linna Darner, Scribe. 12/01/2016. 7:38 PM.  History   Chief Complaint Chief Complaint  Patient presents with  . tick illness    The history is provided by the patient. No language interpreter was used.   HPI Comments: [name] is a 46 y.o. male who presents to the Emergency Department complaining of a spreading erythematous rash to the left axilla beginning on 11/26/16. He was bitten by two ticks on 6/30 while working outside, one bite to his left lower leg and the other to his left axilla. Since that time he has developed a spreading worsening rash around the left axilla bite wound without any rashes around the left lower leg wound. The rash is worse with hot showers. Patient states that he has had fevers in addition to some aching pain in his bilateral knees and elbows, mild headaches, and fatigue for a few days. He was evaluated here on 11/28/16 for the same and his AST enzyme was elevated. He was ultimately diagnosed with [facility] spotted fever and discharged with doxycycline which he has taken as prescribed. He also notes that the bite wound to his left axilla drained spontaneously last night. Patient denies numbness/tingling, focal weakness, nausea, vomiting, or any other associated symptoms.  History reviewed. No pertinent past medical history.  There are no active problems to display for this patient.   Past Surgical History:  Procedure Laterality Date  . BACK SURGERY    . HIP SURGERY    . TONSILLECTOMY     46 years old     Home Medications    Prior to Admission medications   Medication Sig Start Date End Date Taking? Authorizing Provider  acetaminophen (TYLENOL) 500 MG tablet Take 1,000 mg by  mouth every 6 (six) hours as needed for mild pain.     [provider]  albuterol (PROVENTIL HFA;VENTOLIN HFA) 108 (90 Base) MCG/ACT inhaler Inhale 2 puffs into the lungs every 4 (four) hours as needed for wheezing or shortness of breath. 11/11/15   Gilda Crease, MD  benzonatate (TESSALON) 100 MG capsule Take 2 capsules (200 mg total) by mouth 2 (two) times daily as needed for cough. Patient not taking: Reported on 10/04/2016 06/07/16   Roxy Horseman, PA-C  diazepam (VALIUM) 5 MG tablet Take 1 tablet (5 mg total) by mouth 2 (two) times daily. Patient not taking: Reported on 11/28/2016 10/04/16   Barrett Henle, PA-C  doxycycline (VIBRAMYCIN) 50 MG capsule Take 1 capsule (50 mg total) by mouth 2 (two) times daily. 11/28/16 12/08/16  Tegeler, Canary Brim, MD  levofloxacin (LEVAQUIN) 750 MG tablet Take 1 tablet (750 mg total) by mouth daily. Patient not taking: Reported on 10/04/2016 11/11/15   Gilda Crease, MD  methocarbamol (ROBAXIN) 500 MG tablet Take 1 tablet (500 mg total) by mouth every 8 (eight) hours as needed for muscle spasms. 10/17/16   Benjiman Core, MD  Multiple Vitamin (MULTIVITAMIN) tablet Take 1 tablet by mouth daily.    [provider]  naproxen sodium (ANAPROX) 220 MG tablet Take 220 mg by mouth 2 (two) times daily as needed (pain).    [provider]  ondansetron (ZOFRAN ODT) 4 MG disintegrating tablet Take 1 tablet (4 mg total) by mouth  every 8 (eight) hours as needed for nausea or vomiting. 06/07/16   Roxy Horseman, PA-C  oseltamivir (TAMIFLU) 75 MG capsule Take 1 capsule (75 mg total) by mouth every 12 (twelve) hours. Patient not taking: Reported on 10/04/2016 06/07/16   Roxy Horseman, PA-C  oxyCODONE-acetaminophen (PERCOCET/ROXICET) 5-325 MG tablet Take 1-2 tablets by mouth every 6 (six) hours as needed for severe pain. Patient taking differently: Take 0.5 tablets by mouth every 6 (six) hours as needed for severe pain.  10/17/16    Benjiman Core, MD  predniSONE (DELTASONE) 20 MG tablet Take 3 tablets (60 mg total) by mouth daily. Patient not taking: Reported on 11/28/2016 10/17/16   Benjiman Core, MD  traMADol (ULTRAM) 50 MG tablet Take 1 tablet (50 mg total) by mouth every 6 (six) hours as needed. Patient taking differently: Take 50 mg by mouth every 6 (six) hours as needed.  12/03/13   Lurene Shadow, PA-C  trolamine salicylate (ASPERCREME) 10 % cream Apply 1 application topically as needed for muscle pain (Hip and Spinal Pain).    [provider]    Family History No family history on file.  Social History Social History  Substance Use Topics  . Smoking status: Current Every Day Smoker    Packs/day: 0.50    Types: Cigarettes  . Smokeless tobacco: Never Used  . Alcohol use Yes     Comment: socially     Allergies   Sulfa antibiotics   Review of Systems Review of Systems  Constitutional: Positive for fever.  Gastrointestinal: Negative for nausea and vomiting.  Musculoskeletal: Positive for arthralgias and myalgias.  Skin: Positive for color change, rash and wound.  Neurological: Positive for headaches. Negative for weakness and numbness.  All other systems reviewed and are negative.  Physical Exam Updated Vital Signs BP (!) 139/91 (BP Location: Left Arm)   Pulse 75   Temp 98.2 F (36.8 C) (Oral)   Resp 16   Ht 6' (1.829 m)   SpO2 97%   Physical Exam  Constitutional: He is oriented to person, place, and time. He appears well-developed and well-nourished. No distress.  HENT:  Head: Normocephalic and atraumatic.  Eyes: Conjunctivae and EOM are normal.  Neck: Neck supple. No tracheal deviation present.  Neck supple full active range of motion  Cardiovascular: Normal rate.   Pulmonary/Chest: Effort normal. No respiratory distress.  Musculoskeletal: Normal range of motion. He exhibits no edema or tenderness.  Joint supple full active range of motion no swelling or edema    Neurological: He is alert and oriented to person, place, and time. He has normal strength. No cranial nerve deficit or sensory deficit. Coordination normal. GCS eye subscore is 4. GCS verbal subscore is 5. GCS motor subscore is 6.  Skin: Skin is warm and dry.  Macular rash to the torso and bilateral upper extremities  Psychiatric: He has a normal mood and affect. His behavior is normal.  Nursing note and vitals reviewed.  ED Treatments / Results  Labs (all labs ordered are listed, but only abnormal results are displayed) Labs Reviewed  COMPREHENSIVE METABOLIC PANEL - Abnormal; Notable for the following:       Result Value   Glucose, Bld 104 (*)    AST 56 (*)    All other components within normal limits  LIPASE, BLOOD  CBC  B. BURGDORFI ANTIBODIES    EKG  EKG Interpretation None       Radiology No results found.  Procedures Procedures (including critical care time)  DIAGNOSTIC STUDIES: Oxygen Saturation is 99% on RA, normal by my interpretation.    COORDINATION OF CARE: 7:38 PM Discussed treatment plan with pt at bedside and pt agreed to plan.  Medications Ordered in ED Medications - No data to display   Initial Impression / Assessment and Plan / ED Course  I have reviewed the triage vital signs and the nursing notes.  Pertinent labs & imaging results that were available during my care of the patient were reviewed by me and considered in my medical decision making (see chart for details).     46 year old male presents today for evaluation of Lyme disease.  Patient reports his wife made him come in to be evaluated.  He reports minor headache that is similar to previous administration of doxycycline.  Patient is afebrile nontoxic with reassuring laboratory analysis.  He does have very minor elevation in his AST, this is only slightly elevated over previous elevated AST [facility].  Patient is requesting laboratory analysis for Lyme  disease at the request of his wife.  Order will be placed, patient will follow-up as an outpatient with primary care for ongoing evaluation and management.  He is instructed to continue using doxycycline return immediately.  Patient verbalized understanding and agreement to today's plan had no further questions or concerns the time discharge  Final Clinical Impressions(s) / ED Diagnoses   Final 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    New Prescriptions Discharge Medication List as of 12/01/2016  8:28 PM     I personally performed the services described in this documentation, which was scribed in my presence. The recorded information has been reviewed and is accurate.    Eyvonne MechanicHedges, Iver Miklas, PA-C 12/01/16 2105    Alvira MondaySchlossman, Erin, MD 12/04/16 2200

## 2016-12-02 LAB — B. BURGDORFI ANTIBODIES: B burgdorferi Ab IgG+IgM: <0.91 {ISR} (ref 0.00–0.90)

## 2016-12-22 ENCOUNTER — Encounter (HOSPITAL_COMMUNITY): Payer: Self-pay | Admitting: Emergency Medicine

## 2016-12-22 ENCOUNTER — Emergency Department (HOSPITAL_COMMUNITY): Payer: Self-pay

## 2016-12-22 ENCOUNTER — Emergency Department (HOSPITAL_COMMUNITY)
Admission: EM | Admit: 2016-12-22 | Discharge: 2016-12-22 | Disposition: A | Discharge status: Home / Self Care | Payer: Self-pay | Attending: Emergency Medicine | Admitting: Emergency Medicine

## 2016-12-22 DIAGNOSIS — M25562 Pain in left knee: Secondary | ICD-10-CM

## 2016-12-22 DIAGNOSIS — M25561 Pain in right knee: Secondary | ICD-10-CM

## 2016-12-22 DIAGNOSIS — F17210 Nicotine dependence, cigarettes, uncomplicated: Secondary | ICD-10-CM | POA: Insufficient documentation

## 2016-12-22 DIAGNOSIS — M25521 Pain in right elbow: Secondary | ICD-10-CM

## 2016-12-22 DIAGNOSIS — R21 Rash and other nonspecific skin eruption: Secondary | ICD-10-CM | POA: Insufficient documentation

## 2016-12-22 DIAGNOSIS — M25522 Pain in left elbow: Secondary | ICD-10-CM

## 2016-12-22 DIAGNOSIS — R112 Nausea with vomiting, unspecified: Secondary | ICD-10-CM | POA: Insufficient documentation

## 2016-12-22 DIAGNOSIS — M129 Arthropathy, unspecified: Secondary | ICD-10-CM | POA: Insufficient documentation

## 2016-12-22 HISTORY — DX: Spotted fever due to Rickettsia rickettsii: A77.0

## 2016-12-22 HISTORY — DX: Lyme disease, unspecified: A69.20

## 2016-12-22 LAB — COMPREHENSIVE METABOLIC PANEL
ALBUMIN: 4 g/dL (ref 3.5–5.0)
ALT: 60 U/L (ref 17–63)
AST: 59 U/L — AB (ref 15–41)
Alkaline Phosphatase: 57 U/L (ref 38–126)
Anion gap: 8 (ref 5–15)
BUN: 8 mg/dL (ref 6–20)
CHLORIDE: 109 mmol/L (ref 101–111)
CO2: 22 mmol/L (ref 22–32)
Calcium: 8.9 mg/dL (ref 8.9–10.3)
Creatinine, Ser: 1.04 mg/dL (ref 0.61–1.24)
GFR calc Af Amer: >60 mL/min (ref >60)
GFR calc non Af Amer: >60 mL/min (ref >60)
GLUCOSE: 107 mg/dL — AB (ref 65–99)
POTASSIUM: 4 mmol/L (ref 3.5–5.1)
SODIUM: 139 mmol/L (ref 135–145)
Total Bilirubin: 0.4 mg/dL (ref 0.3–1.2)
Total Protein: 6.7 g/dL (ref 6.5–8.1)

## 2016-12-22 LAB — CBC WITH DIFFERENTIAL/PLATELET
BASOS ABS: 0 10*3/uL (ref 0.0–0.1)
BASOS PCT: 0 %
EOS ABS: 0.1 10*3/uL (ref 0.0–0.7)
EOS PCT: 1 %
HCT: 49.1 % (ref 39.0–52.0)
Hemoglobin: 16.4 g/dL (ref 13.0–17.0)
Lymphocytes Relative: 34 %
Lymphs Abs: 2.7 10*3/uL (ref 0.7–4.0)
MCH: 31.5 pg (ref 26.0–34.0)
MCHC: 33.4 g/dL (ref 30.0–36.0)
MCV: 94.4 fL (ref 78.0–100.0)
MONO ABS: 0.4 10*3/uL (ref 0.1–1.0)
Monocytes Relative: 6 %
NEUTROS ABS: 4.6 10*3/uL (ref 1.7–7.7)
Neutrophils Relative %: 59 %
Platelets: 177 10*3/uL (ref 150–400)
RBC: 5.2 MIL/uL (ref 4.22–5.81)
RDW: 13.6 % (ref 11.5–15.5)
WBC: 7.9 10*3/uL (ref 4.0–10.5)

## 2016-12-22 LAB — SEDIMENTATION RATE: Sed Rate: 8 mm/hr (ref 0–16)

## 2016-12-22 LAB — C-REACTIVE PROTEIN: CRP: 2.1 mg/dL — ABNORMAL HIGH (ref <1.0)

## 2016-12-22 MED ORDER — ONDANSETRON HCL 4 MG/2ML IJ SOLN
4.0000 mg | Freq: Once | INTRAMUSCULAR | Status: AC
  Administered 2016-12-22: 4 mg via INTRAVENOUS
  Filled 2016-12-22: qty 2

## 2016-12-22 MED ORDER — SODIUM CHLORIDE 0.9 % IV BOLUS (SEPSIS)
1000.0000 mL | Freq: Once | INTRAVENOUS | Status: AC
  Administered 2016-12-22: 1000 mL via INTRAVENOUS

## 2016-12-22 MED ORDER — DEXAMETHASONE SODIUM PHOSPHATE 10 MG/ML IJ SOLN
10.0000 mg | Freq: Once | INTRAMUSCULAR | Status: AC
  Administered 2016-12-22: 10 mg via INTRAVENOUS
  Filled 2016-12-22: qty 1

## 2016-12-22 MED ORDER — PREDNISONE 20 MG PO TABS: 40.0000 mg | ORAL_TABLET | Freq: Every day | ORAL | 0 refills | Status: DC

## 2016-12-22 MED ORDER — DOXYCYCLINE HYCLATE 100 MG PO CAPS: 100.0000 mg | ORAL_CAPSULE | Freq: Two times a day (BID) | ORAL | 0 refills | Status: DC

## 2016-12-22 MED ORDER — DEXAMETHASONE SODIUM PHOSPHATE 10 MG/ML IJ SOLN: 10.0000 mg | Freq: Once | INTRAMUSCULAR | Status: DC

## 2016-12-22 MED ORDER — KETOROLAC TROMETHAMINE 30 MG/ML IJ SOLN
30.0000 mg | Freq: Once | INTRAMUSCULAR | Status: AC
  Administered 2016-12-22: 30 mg via INTRAVENOUS
  Filled 2016-12-22: qty 1

## 2016-12-22 NOTE — Discharge Instructions (Signed)
Take Doxycycline twice a day for 10 days Take Prednisone for the next week Follow up with [facility] (say it is a hospital follow up) Return for worsening symptoms

## 2016-12-22 NOTE — ED Provider Notes (Signed)
[facility] DEPT Provider Note   CSN: [id number] Arrival date & time: 12/22/16  1005     History   Chief Complaint Chief Complaint  Patient presents with  . [place] fever  . Rash    HPI [name] is a 46 y.o. male who presents with a rash, arthralgias, nausea and vomiting. He states that he was treated for tickborne illness about one month ago. He has been working outside, fixing HVACs and pulled two ticks off of him in his L armpit and behind his left knee. Afterwards, he developed fevers, rash, nausea. He was seen in the ED initially on 7/2 and was prescribed doxycycline and he completed this course. He came back on 7/5 to have his blood work checked for Lyme disease. This has come back negative. He states that since he was seen his symptoms have been persistent. The rash is on his trunk and arms and legs. It sometimes gets lighter if he uses Benadryl cream however returns soon after. It is itchy. He has had intermittent fevers however this has gotten better. He has severe joint pain in his elbows and knees. He has chronic pain in his knee due to previous injury. He also has nausea and vomiting but denies any abdominal pain. He is also having some myalgias, fatigue, chest pain and shortness of breath. No recent travel.  HPI  Past Medical History:  Diagnosis Date  . Lyme disease   . [facility] spotted fever     There are no active problems to display for this patient.   Past Surgical History:  Procedure Laterality Date  . BACK SURGERY    . HIP SURGERY    . TONSILLECTOMY     46 years old       Home Medications    Prior to Admission medications   Medication Sig Start Date End Date Taking? Authorizing Provider  acetaminophen (TYLENOL) 500 MG tablet Take 1,000 mg by mouth every 6 (six) hours as needed for mild pain.     [provider]  albuterol (PROVENTIL HFA;VENTOLIN HFA) 108 (90 Base) MCG/ACT inhaler Inhale 2 puffs into the lungs  every 4 (four) hours as needed for wheezing or shortness of breath. 11/11/15   Orpah Greek, MD  benzonatate (TESSALON) 100 MG capsule Take 2 capsules (200 mg total) by mouth 2 (two) times daily as needed for cough. Patient not taking: Reported on 10/04/2016 06/07/16   Montine Circle, PA-C  diazepam (VALIUM) 5 MG tablet Take 1 tablet (5 mg total) by mouth 2 (two) times daily. Patient not taking: Reported on 11/28/2016 10/04/16   Nona Dell, PA-C  levofloxacin (LEVAQUIN) 750 MG tablet Take 1 tablet (750 mg total) by mouth daily. Patient not taking: Reported on 10/04/2016 11/11/15   Orpah Greek, MD  methocarbamol (ROBAXIN) 500 MG tablet Take 1 tablet (500 mg total) by mouth every 8 (eight) hours as needed for muscle spasms. 10/17/16   Davonna Belling, MD  Multiple Vitamin (MULTIVITAMIN) tablet Take 1 tablet by mouth daily.    [provider]  naproxen sodium (ANAPROX) 220 MG tablet Take 220 mg by mouth 2 (two) times daily as needed (pain).    [provider]  ondansetron (ZOFRAN ODT) 4 MG disintegrating tablet Take 1 tablet (4 mg total) by mouth every 8 (eight) hours as needed for nausea or vomiting. 06/07/16   Montine Circle, PA-C  oseltamivir (TAMIFLU) 75 MG capsule Take 1 capsule (75 mg total) by mouth  every 12 (twelve) hours. Patient not taking: Reported on 10/04/2016 06/07/16   Roxy Horseman, PA-C  oxyCODONE-acetaminophen (PERCOCET/ROXICET) 5-325 MG tablet Take 1-2 tablets by mouth every 6 (six) hours as needed for severe pain. Patient taking differently: Take 0.5 tablets by mouth every 6 (six) hours as needed for severe pain.  10/17/16   Benjiman Core, MD  predniSONE (DELTASONE) 20 MG tablet Take 3 tablets (60 mg total) by mouth daily. Patient not taking: Reported on 11/28/2016 10/17/16   Benjiman Core, MD  traMADol (ULTRAM) 50 MG tablet Take 1 tablet (50 mg total) by mouth every 6 (six) hours as needed. Patient taking differently: Take 50 mg by  mouth every 6 (six) hours as needed.  12/03/13   Lurene Shadow, PA-C  trolamine salicylate (ASPERCREME) 10 % cream Apply 1 application topically as needed for muscle pain (Hip and Spinal Pain).    [provider]    Family History No family history on file.  Social History Social History  Substance Use Topics  . Smoking status: Current Every Day Smoker    Packs/day: 0.50    Types: Cigarettes  . Smokeless tobacco: Never Used  . Alcohol use Yes     Comment: socially     Allergies   Sulfa antibiotics   Review of Systems Review of Systems  Constitutional: Positive for fever.  Respiratory: Positive for shortness of breath.   Cardiovascular: Positive for chest pain.  Gastrointestinal: Positive for nausea and vomiting. Negative for abdominal pain.  Musculoskeletal: Positive for arthralgias.  Skin: Positive for rash.  All other systems reviewed and are negative.    Physical Exam Updated Vital Signs BP (!) 131/96 (BP Location: Right Arm)   Pulse 92   Temp 98.5 F (36.9 C) (Oral)   Resp (!) 24   Ht 6' (1.829 m)   Wt 107 kg (236 lb)   SpO2 99%   BMI 32.01 kg/m   Physical Exam  Constitutional: He is oriented to person, place, and time. He appears well-developed and well-nourished. No distress.  Appears uncomfortable with legs drawn up to body  HENT:  Head: Normocephalic and atraumatic.  Eyes: Pupils are equal, round, and reactive to light. Conjunctivae are normal. Right eye exhibits no discharge. Left eye exhibits no discharge. No scleral icterus.  Neck: Normal range of motion.  Cardiovascular: Normal rate and regular rhythm.  Exam reveals no gallop and no friction rub.   No murmur heard. Pulmonary/Chest: Effort normal and breath sounds normal. No respiratory distress. He has no wheezes. He has no rales. He exhibits no tenderness.  Abdominal: Soft. Bowel sounds are normal. He exhibits no distension and no mass. There is no tenderness. There is no rebound and no  guarding. No hernia.  Musculoskeletal:  No redness or swelling of joints  Neurological: He is alert and oriented to person, place, and time.  Skin: Skin is warm and dry. Rash (Generalized, annular rash with raised borders and central clearing over torso, arms, low back, and thighs ) noted.  Psychiatric: He has a normal mood and affect. His behavior is normal.  Nursing note and vitals reviewed.      ED Treatments / Results  Labs (all labs ordered are listed, but only abnormal results are displayed) Labs Reviewed  COMPREHENSIVE METABOLIC PANEL - Abnormal; Notable for the following:       Result Value   Glucose, Bld 107 (*)    AST 59 (*)    All other components within normal limits  C-REACTIVE  PROTEIN - Abnormal; Notable for the following:    CRP 2.1 (*)    All other components within normal limits  CBC WITH DIFFERENTIAL/PLATELET  SEDIMENTATION RATE  ROCKY MTN SPOTTED FVR ABS PNL(IGG+IGM)  HIV ANTIBODY (ROUTINE TESTING)  ANTINUCLEAR ANTIBODIES, IFA  CMV IGM  EPSTEIN-BARR VIRUS VCA ANTIBODY PANEL    EKG  EKG Interpretation  Date/Time:  Thursday December 22 2016 11:43:36 EDT Ventricular Rate:  81 PR Interval:    QRS Duration: 94 QT Interval:  374 QTC Calculation: 435 R Axis:   88 Text Interpretation:  Sinus rhythm Confirmed by Isla Pence [phone]) on 12/22/2016 11:45:34 AM       Radiology Dg Chest 2 View  Result Date: 12/22/2016 CLINICAL DATA:  Chest pain, diffuse body aches, fever and nausea over the last month, history of tick bite several weeks ago EXAM: CHEST  2 VIEW COMPARISON:  Chest x-ray of 11/28/2016 FINDINGS: No active infiltrate or effusion is seen. Mediastinal and hilar contours are unremarkable. The heart is within normal limits in size. No bony abnormality is seen. IMPRESSION: No active cardiopulmonary disease. Electronically Signed   By: Ivar Drape M.D.   On: 12/22/2016 11:18    Procedures Procedures (including critical care time)  Medications Ordered  in ED Medications  sodium chloride 0.9 % bolus 1,000 mL (0 mLs Intravenous Stopped 12/22/16 1226)  ondansetron (ZOFRAN) injection 4 mg (4 mg Intravenous Given 12/22/16 1130)  dexamethasone (DECADRON) injection 10 mg (10 mg Intravenous Given 12/22/16 1130)  ketorolac (TORADOL) 30 MG/ML injection 30 mg (30 mg Intravenous Given 12/22/16 1226)     Initial Impression / Assessment and Plan / ED Course  I have reviewed the triage vital signs and the nursing notes.  Pertinent labs & imaging results that were available during my care of the patient were reviewed by me and considered in my medical decision making (see chart for details).  46 year old male with intermittent fevers, myalgias, arthralgias, and rash. Vitals are normal. Rash is generalized and annular - does not look like RMSF. Labs are overall unremarkable. EKG and CXR are normal. Fluids, steroids, pain medicine ordered.  Spoke with Dr. Linus Salmons with ID who agrees that rash does not look like RMSF. He recommends HIV, CMV, EBV. He states that likely symptoms are autoimmune since they have lasted ~1 month. Will also send ANA, CRP, ESR. On recheck, pt reports feeling better. Rash is resolving with steroids. Will d/c with course of steroids and repeat Doxy since it looks like he was underdosed last time. Also advised f/u with [facility] to discuss his results. He verbalized understanding. Return precautions given.  Final Clinical Impressions(s) / ED Diagnoses   Final diagnoses:  Rash and nonspecific skin eruption  Arthralgia of both elbows  Arthralgia of both knees  Non-intractable vomiting with nausea, unspecified vomiting type    New Prescriptions Discharge Medication List as of 12/22/2016  1:33 PM    START taking these medications   Details  doxycycline (VIBRAMYCIN) 100 MG capsule Take 1 capsule (100 mg total) by mouth 2 (two) times daily., Starting Thu 12/22/2016, Print         Recardo Evangelist, PA-C 12/22/16 1534      Isla Pence, MD 12/22/16 1606

## 2016-12-22 NOTE — ED Triage Notes (Signed)
Pt has rash over entire body-- has been seen here for tick bites, rash over trunk, legs, arms, aching in joints, has taken doxycycline last week, finished course of antibiotics. Pt appears uncomfortable.

## 2016-12-22 NOTE — ED Notes (Signed)
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

## 2016-12-23 LAB — CMV IGM: CMV IgM: <30 AU/mL (ref 0.0–29.9)

## 2016-12-23 LAB — EPSTEIN-BARR VIRUS VCA ANTIBODY PANEL
EBV Early Antigen Ab, IgG: <9 U/mL (ref 0.0–8.9)
EBV NA IgG: >600 U/mL — ABNORMAL HIGH (ref 0.0–17.9)
EBV VCA IgG: 276 U/mL — ABNORMAL HIGH (ref 0.0–17.9)
EBV VCA IgM: <36 U/mL (ref 0.0–35.9)

## 2016-12-23 LAB — ANTINUCLEAR ANTIBODIES, IFA: ANTINUCLEAR ANTIBODIES, IFA: NEGATIVE

## 2016-12-23 LAB — HIV ANTIBODY (ROUTINE TESTING W REFLEX): HIV SCREEN 4TH GENERATION: NONREACTIVE

## 2016-12-26 LAB — ROCKY MTN SPOTTED FVR ABS PNL(IGG+IGM)
RMSF IgG: POSITIVE — AB
RMSF IgM: 0.43 index (ref 0.00–0.89)

## 2016-12-26 LAB — RMSF, IGG, IFA

## 2016-12-28 ENCOUNTER — Emergency Department (HOSPITAL_COMMUNITY)
Admission: EM | Admit: 2016-12-28 | Discharge: 2016-12-28 | Disposition: A | Discharge status: Home / Self Care | Payer: Self-pay | Attending: Emergency Medicine | Admitting: Emergency Medicine

## 2016-12-28 ENCOUNTER — Encounter (HOSPITAL_COMMUNITY): Payer: Self-pay | Admitting: *Deleted

## 2016-12-28 ENCOUNTER — Emergency Department (HOSPITAL_COMMUNITY): Payer: Self-pay

## 2016-12-28 DIAGNOSIS — A6920 Lyme disease, unspecified: Secondary | ICD-10-CM | POA: Insufficient documentation

## 2016-12-28 DIAGNOSIS — F17210 Nicotine dependence, cigarettes, uncomplicated: Secondary | ICD-10-CM | POA: Insufficient documentation

## 2016-12-28 DIAGNOSIS — R109 Unspecified abdominal pain: Secondary | ICD-10-CM | POA: Insufficient documentation

## 2016-12-28 DIAGNOSIS — A770 Spotted fever due to Rickettsia rickettsii: Secondary | ICD-10-CM | POA: Insufficient documentation

## 2016-12-28 DIAGNOSIS — Z79899 Other long term (current) drug therapy: Secondary | ICD-10-CM | POA: Insufficient documentation

## 2016-12-28 LAB — CBC
HCT: 47.3 % (ref 39.0–52.0)
HEMOGLOBIN: 16.4 g/dL (ref 13.0–17.0)
MCH: 32.7 pg (ref 26.0–34.0)
MCHC: 34.7 g/dL (ref 30.0–36.0)
MCV: 94.2 fL (ref 78.0–100.0)
Platelets: 228 10*3/uL (ref 150–400)
RBC: 5.02 MIL/uL (ref 4.22–5.81)
RDW: 13.6 % (ref 11.5–15.5)
WBC: 19.1 10*3/uL — AB (ref 4.0–10.5)

## 2016-12-28 LAB — COMPREHENSIVE METABOLIC PANEL
ALBUMIN: 4.1 g/dL (ref 3.5–5.0)
ALT: 69 U/L — ABNORMAL HIGH (ref 17–63)
ANION GAP: 11 (ref 5–15)
AST: 59 U/L — ABNORMAL HIGH (ref 15–41)
Alkaline Phosphatase: 64 U/L (ref 38–126)
BILIRUBIN TOTAL: 1.1 mg/dL (ref 0.3–1.2)
BUN: 12 mg/dL (ref 6–20)
CHLORIDE: 104 mmol/L (ref 101–111)
CO2: 21 mmol/L — ABNORMAL LOW (ref 22–32)
Calcium: 9.4 mg/dL (ref 8.9–10.3)
Creatinine, Ser: 1.02 mg/dL (ref 0.61–1.24)
GFR calc Af Amer: >60 mL/min (ref >60)
GLUCOSE: 156 mg/dL — AB (ref 65–99)
POTASSIUM: 4.2 mmol/L (ref 3.5–5.1)
Sodium: 136 mmol/L (ref 135–145)
TOTAL PROTEIN: 6.7 g/dL (ref 6.5–8.1)

## 2016-12-28 LAB — URINALYSIS, ROUTINE W REFLEX MICROSCOPIC
BACTERIA UA: NONE SEEN
BILIRUBIN URINE: NEGATIVE
Glucose, UA: NEGATIVE mg/dL
KETONES UR: NEGATIVE mg/dL
LEUKOCYTES UA: NEGATIVE
NITRITE: NEGATIVE
PROTEIN: NEGATIVE mg/dL
Specific Gravity, Urine: 1.017 (ref 1.005–1.030)
Squamous Epithelial / LPF: NONE SEEN
WBC UA: NONE SEEN WBC/hpf (ref 0–5)
pH: 5 (ref 5.0–8.0)

## 2016-12-28 LAB — LIPASE, BLOOD: LIPASE: 33 U/L (ref 11–51)

## 2016-12-28 MED ORDER — MORPHINE SULFATE (PF) 4 MG/ML IV SOLN
4.0000 mg | Freq: Once | INTRAVENOUS | Status: AC
  Administered 2016-12-28: 4 mg via INTRAVENOUS
  Filled 2016-12-28: qty 1

## 2016-12-28 MED ORDER — IOPAMIDOL (ISOVUE-300) INJECTION 61%
INTRAVENOUS | Status: AC
  Administered 2016-12-28: 100 mL
  Filled 2016-12-28: qty 100

## 2016-12-28 MED ORDER — ONDANSETRON HCL 4 MG/2ML IJ SOLN
4.0000 mg | Freq: Once | INTRAMUSCULAR | Status: AC
  Administered 2016-12-28: 4 mg via INTRAVENOUS
  Filled 2016-12-28: qty 2

## 2016-12-28 NOTE — Discharge Instructions (Signed)
As we discussed, your CT today was normal. I recommend that you follow-up with your primary care doctor. Return here for any new/worsening symptoms.

## 2016-12-28 NOTE — ED Triage Notes (Signed)
Pt reports recent treatment for lymes and [place] fever. Having severe abd pain and left side back pain over past 2-3 days. Rash and nausea has improved. Still has generalized fatigue.

## 2016-12-28 NOTE — ED Notes (Signed)
Patient transported to CT 

## 2016-12-28 NOTE — ED Provider Notes (Signed)
[facility] DEPT Provider Note   CSN: [id number] Arrival date & time: 12/28/16  1414     History   Chief Complaint Chief Complaint  Patient presents with  . Abdominal Pain    HPI Garrett FisherJames R Devaux III is a 46 y.o. male.  The history is provided by the patient and medical records.  Abdominal Pain   Associated symptoms include diarrhea (resolved), nausea and vomiting.    46 year old male with 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, presenting to the ED with abdominal pain.  Patient reports this is been ongoing for about 3 days now. Reports initially he was having some diarrhea but this resolved. States he has a lot of pain in his right upper quadrant, but has also been feeling in his lower abdomen and left upper abdomen as well. States he has felt some pain in his left upper back as well.  Denies chest pain or SOB.  He reports nausea and vomiting, does report this is worse after eating. States it occurs independent of type of food. Has not noticed any fever or chills. No urinary symptoms. He denies any history of abdominal surgery.  Past Medical History:  Diagnosis Date  . Lyme disease   . 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     There are no active problems to display for this patient.   Past Surgical History:  Procedure Laterality Date  . BACK SURGERY    . HIP SURGERY    . TONSILLECTOMY     46 years old       Home Medications    Prior to Admission medications   Medication Sig Start Date End Date Taking? Authorizing Provider  acetaminophen (TYLENOL) 500 MG tablet Take 1,000 mg by mouth every 6 (six) hours as needed for mild pain.     [provider]  albuterol (PROVENTIL HFA;VENTOLIN HFA) 108 (90 Base) MCG/ACT inhaler Inhale 2 puffs into the lungs every 4 (four) hours as needed for wheezing or shortness of breath. 11/11/15   Gilda CreasePollina, Christopher J, MD  doxycycline (VIBRAMYCIN) 100 MG capsule Take 1 capsule (100 mg total) by mouth 2 (two) times daily. 12/22/16    Bethel BornGekas, Kelly Marie, PA-C  methocarbamol (ROBAXIN) 500 MG tablet Take 1 tablet (500 mg total) by mouth every 8 (eight) hours as needed for muscle spasms. 10/17/16   Benjiman CorePickering, Nathan, MD  Multiple Vitamin (MULTIVITAMIN) tablet Take 1 tablet by mouth daily.    [provider]  naproxen sodium (ANAPROX) 220 MG tablet Take 220 mg by mouth 2 (two) times daily as needed (pain).    [provider]  ondansetron (ZOFRAN ODT) 4 MG disintegrating tablet Take 1 tablet (4 mg total) by mouth every 8 (eight) hours as needed for nausea or vomiting. 06/07/16   Roxy HorsemanBrowning, Robert, PA-C  oxyCODONE-acetaminophen (PERCOCET/ROXICET) 5-325 MG tablet Take 1-2 tablets by mouth every 6 (six) hours as needed for severe pain. Patient taking differently: Take 0.5 tablets by mouth every 6 (six) hours as needed for severe pain.  10/17/16   Benjiman CorePickering, Nathan, MD  predniSONE (DELTASONE) 20 MG tablet Take 2 tablets (40 mg total) by mouth daily. 12/22/16   Bethel BornGekas, Kelly Marie, PA-C  traMADol (ULTRAM) 50 MG tablet Take 1 tablet (50 mg total) by mouth every 6 (six) hours as needed. Patient taking differently: Take 50 mg by mouth every 6 (six) hours as needed.  12/03/13   Lurene ShadowPhelps, Erin O, PA-C  trolamine salicylate (ASPERCREME) 10 % cream Apply 1 application topically as needed for muscle  pain (Hip and Spinal Pain).    [provider]    Family History History reviewed. No pertinent family history.  Social History Social History  Substance Use Topics  . Smoking status: Current Every Day Smoker    Packs/day: 0.50    Types: Cigarettes  . Smokeless tobacco: Never Used  . Alcohol use Yes     Comment: socially     Allergies   Sulfa antibiotics   Review of Systems Review of Systems  Gastrointestinal: Positive for abdominal pain, diarrhea (resolved), nausea and vomiting.  All other systems reviewed and are negative.    Physical Exam Updated Vital Signs BP 122/84   Pulse 95   Temp 98.7 F (37.1 C)  (Oral)   Resp 19   SpO2 97%   Physical Exam  Constitutional: He is oriented to person, place, and time. He appears well-developed and well-nourished.  HENT:  Head: Normocephalic and atraumatic.  Mouth/Throat: Oropharynx is clear and moist.  Eyes: Pupils are equal, round, and reactive to light. Conjunctivae and EOM are normal.  Neck: Normal range of motion.  Cardiovascular: Normal rate, regular rhythm and normal heart sounds.   Pulmonary/Chest: Effort normal and breath sounds normal.  Abdominal: Soft. Bowel sounds are normal. There is tenderness in the right upper quadrant, right lower quadrant, epigastric area and left upper quadrant.  TTP RUQ more pronounced than other areas  Musculoskeletal: Normal range of motion.  Left upper back around scapula and left paraspinal muscles are tender to palpation, no spasm noted, no midline deformity or step-off, full range of motion maintained  Neurological: He is alert and oriented to person, place, and time.  Skin: Skin is warm and dry. No rash noted.  Psychiatric: He has a normal mood and affect.  Nursing note and vitals reviewed.    ED Treatments / Results  Labs (all labs ordered are listed, but only abnormal results are displayed) Labs Reviewed  COMPREHENSIVE METABOLIC PANEL - Abnormal; Notable for the following:       Result Value   CO2 21 (*)    Glucose, Bld 156 (*)    AST 59 (*)    ALT 69 (*)    All other components within normal limits  CBC - Abnormal; Notable for the following:    WBC 19.1 (*)    All other components within normal limits  URINALYSIS, ROUTINE W REFLEX MICROSCOPIC - Abnormal; Notable for the following:    Hgb urine dipstick SMALL (*)    All other components within normal limits  LIPASE, BLOOD    EKG  EKG Interpretation None       Radiology Ct Abdomen Pelvis W Contrast  Result Date: 12/28/2016 CLINICAL DATA:  46 year old male with a history of 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 EXAM: CT ABDOMEN AND PELVIS  WITH CONTRAST TECHNIQUE: Multidetector CT imaging of the abdomen and pelvis was performed using the standard protocol following bolus administration of intravenous contrast. CONTRAST:  ISOVUE-300 IOPAMIDOL (ISOVUE-300) INJECTION 61% COMPARISON:  01/01/2009 FINDINGS: Lower chest: No acute abnormality. Hepatobiliary: Diffusely decreased liver attenuation. Unremarkable appearance of the gallbladder. Pancreas: Unremarkable. No pancreatic ductal dilatation or surrounding inflammatory changes. Spleen: Coarse calcifications of spleen, compatible with prior granulomatous disease Adrenals/Urinary Tract: Unremarkable appearance of adrenal glands. Unremarkable appearance of bilateral kidneys. No hydronephrosis or nephrolithiasis. Unremarkable course of the bilateral ureters. Unremarkable appearance of urinary bladder. Stomach/Bowel: Unremarkable appearance of stomach. Unremarkable small bowel. No abnormal distention or transition point. Normal appendix. No colonic obstruction. No inflammatory changes. Vascular/Lymphatic: Unremarkable  appearance of the vasculature with no significant atherosclerotic changes. No adenopathy Reproductive: Unremarkable appearance of the pelvic structures Other: No abdominal wall hernia. Small fat containing right inguinal hernia. Musculoskeletal: No displaced fracture. Surgical changes of prior anterior approach for discectomy infusion of L5-S1. Partial facetectomy posteriorly. IMPRESSION: No acute finding to account for the patient's abdominal pain. Steatosis. Coarse calcifications of the spleen compatible with prior granulomatous disease. Electronically Signed   By: Gilmer MorJaime  Wagner D.O.   On: 12/28/2016 21:24    Procedures Procedures (including critical care time)  Medications Ordered in ED Medications  morphine 4 MG/ML injection 4 mg (4 mg Intravenous Given 12/28/16 2056)  ondansetron (ZOFRAN) injection 4 mg (4 mg Intravenous Given 12/28/16 2055)  iopamidol (ISOVUE-300) 61 % injection  (100 mLs  Contrast Given 12/28/16 2103)     Initial Impression / Assessment and Plan / ED Course  I have reviewed the triage vital signs and the nursing notes.  Pertinent labs & imaging results that were available during my care of the patient were reviewed by me and considered in my medical decision making (see chart for details).  46 year old male here with abdominal pain. Reports this is been ongoing for about 3 days now. Reports it is right upper, left upper, and right lower abdomen. No chest pain or SOB.  He is afebrile and nontoxic. There is some tenderness noted on exam of these areas. His vitals are stable. Screening lab work with leukocytosis noted of 19K-- patient has been on steroids for his treatment of large amount spotted fever along with doxycycline. UA without signs of infection. Lipase within normal limits.  CT scan obtained given his various areas of pain, no acute findings noted. Patient remained stable here. Pain is decreased with single dose of meds here. [facility] and room without issue.  States he feels better and is ready to go. Recommend close follow-up with PCP. He understands to return here for any new or worsening symptoms. Patient discharged home in stable condition.  Final Clinical Impressions(s) / ED Diagnoses   Final diagnoses:  Abdominal pain, unspecified abdominal location    New Prescriptions Discharge Medication List as of 12/28/2016 10:36 PM       Garlon HatchetSanders, Alysiah Suppa M, PA-C 12/28/16 2317    Lavera GuiseLiu, Dana Duo, MD 12/28/16 [phone]

## 2016-12-30 ENCOUNTER — Emergency Department (HOSPITAL_COMMUNITY)
Admission: EM | Admit: 2016-12-30 | Discharge: 2016-12-30 | Disposition: A | Discharge status: Home / Self Care | Payer: Worker's Compensation | Attending: Emergency Medicine | Admitting: Emergency Medicine

## 2016-12-30 ENCOUNTER — Encounter (HOSPITAL_COMMUNITY): Payer: Self-pay

## 2016-12-30 ENCOUNTER — Emergency Department (HOSPITAL_COMMUNITY): Payer: Worker's Compensation

## 2016-12-30 DIAGNOSIS — Z79899 Other long term (current) drug therapy: Secondary | ICD-10-CM | POA: Insufficient documentation

## 2016-12-30 DIAGNOSIS — R21 Rash and other nonspecific skin eruption: Secondary | ICD-10-CM | POA: Diagnosis present

## 2016-12-30 DIAGNOSIS — F17210 Nicotine dependence, cigarettes, uncomplicated: Secondary | ICD-10-CM | POA: Insufficient documentation

## 2016-12-30 DIAGNOSIS — A770 Spotted fever due to Rickettsia rickettsii: Secondary | ICD-10-CM | POA: Diagnosis not present

## 2016-12-30 DIAGNOSIS — M2550 Pain in unspecified joint: Secondary | ICD-10-CM

## 2016-12-30 DIAGNOSIS — M5441 Lumbago with sciatica, right side: Secondary | ICD-10-CM

## 2016-12-30 LAB — CBC WITH DIFFERENTIAL/PLATELET
Basophils Absolute: 0 10*3/uL (ref 0.0–0.1)
Basophils Relative: 0 %
Eosinophils Absolute: 0.1 10*3/uL (ref 0.0–0.7)
Eosinophils Relative: 1 %
HCT: 50.2 % (ref 39.0–52.0)
Hemoglobin: 16.6 g/dL (ref 13.0–17.0)
Lymphocytes Relative: 29 %
Lymphs Abs: 3.4 10*3/uL (ref 0.7–4.0)
MCH: 31.4 pg (ref 26.0–34.0)
MCHC: 33.1 g/dL (ref 30.0–36.0)
MCV: 95.1 fL (ref 78.0–100.0)
Monocytes Absolute: 0.9 10*3/uL (ref 0.1–1.0)
Monocytes Relative: 7 %
Neutro Abs: 7.2 10*3/uL (ref 1.7–7.7)
Neutrophils Relative %: 63 %
Platelets: 182 10*3/uL (ref 150–400)
RBC: 5.28 MIL/uL (ref 4.22–5.81)
RDW: 13.9 % (ref 11.5–15.5)
WBC: 11.6 10*3/uL — ABNORMAL HIGH (ref 4.0–10.5)

## 2016-12-30 LAB — COMPREHENSIVE METABOLIC PANEL
ALT: 89 U/L — ABNORMAL HIGH (ref 17–63)
AST: 81 U/L — ABNORMAL HIGH (ref 15–41)
Albumin: 4 g/dL (ref 3.5–5.0)
Alkaline Phosphatase: 55 U/L (ref 38–126)
Anion gap: 12 (ref 5–15)
BUN: 15 mg/dL (ref 6–20)
CO2: 23 mmol/L (ref 22–32)
Calcium: 9.2 mg/dL (ref 8.9–10.3)
Chloride: 103 mmol/L (ref 101–111)
Creatinine, Ser: 1.18 mg/dL (ref 0.61–1.24)
GFR calc Af Amer: >60 mL/min (ref >60)
GFR calc non Af Amer: >60 mL/min (ref >60)
Glucose, Bld: 124 mg/dL — ABNORMAL HIGH (ref 65–99)
Potassium: 3.5 mmol/L (ref 3.5–5.1)
Sodium: 138 mmol/L (ref 135–145)
Total Bilirubin: 0.8 mg/dL (ref 0.3–1.2)
Total Protein: 6.7 g/dL (ref 6.5–8.1)

## 2016-12-30 LAB — URINALYSIS, ROUTINE W REFLEX MICROSCOPIC
Bilirubin Urine: NEGATIVE
Glucose, UA: NEGATIVE mg/dL
Hgb urine dipstick: NEGATIVE
Ketones, ur: NEGATIVE mg/dL
Leukocytes, UA: NEGATIVE
Nitrite: NEGATIVE
Protein, ur: NEGATIVE mg/dL
Specific Gravity, Urine: 1.016 (ref 1.005–1.030)
pH: 7 (ref 5.0–8.0)

## 2016-12-30 LAB — C-REACTIVE PROTEIN: CRP: 1.2 mg/dL — ABNORMAL HIGH (ref <1.0)

## 2016-12-30 LAB — SEDIMENTATION RATE: Sed Rate: 3 mm/hr (ref 0–16)

## 2016-12-30 MED ORDER — SODIUM CHLORIDE 0.9 % IV BOLUS (SEPSIS)
1000.0000 mL | Freq: Once | INTRAVENOUS | Status: AC
  Administered 2016-12-30: 1000 mL via INTRAVENOUS

## 2016-12-30 MED ORDER — DEXAMETHASONE SODIUM PHOSPHATE 10 MG/ML IJ SOLN
10.0000 mg | Freq: Once | INTRAMUSCULAR | Status: AC
  Administered 2016-12-30: 10 mg via INTRAVENOUS
  Filled 2016-12-30: qty 1

## 2016-12-30 MED ORDER — ONDANSETRON HCL 4 MG/2ML IJ SOLN
4.0000 mg | Freq: Once | INTRAMUSCULAR | Status: AC
Start: 2016-12-30 — End: 2016-12-30
  Administered 2016-12-30: 4 mg via INTRAVENOUS
  Filled 2016-12-30: qty 2

## 2016-12-30 MED ORDER — DOXYCYCLINE HYCLATE 100 MG PO CAPS: 100.0000 mg | ORAL_CAPSULE | Freq: Two times a day (BID) | ORAL | 0 refills | Status: AC

## 2016-12-30 NOTE — Discharge Instructions (Signed)
Please take all of your antibiotics until finished!   You may develop abdominal discomfort or diarrhea from the antibiotic.  You may help offset this with probiotics which you can buy or get in yogurt. Do not eat  or take the probiotics until 2 hours after your antibiotic. Continue using your usual medications for your joint pain and back pain. Apply ice or heat to the affected areas for comfort. Follow-up with your orthopedist as scheduled for reevaluation of your back pain. It will be very important to follow-up with infectious disease specialist as soon as possible for evaluation of your 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. He may continue using Benadryl and corticosteroid cream for the rash and itchiness. Return to the ED immediately if any concerning signs or symptoms develop.

## 2016-12-30 NOTE — ED Provider Notes (Signed)
[facility] Provider Note   CSN: [id number] Arrival date & time: 12/30/16  [id number]     History   Chief Complaint Chief Complaint  Patient presents with  . Fever    HPI [name] is a 46 y.o. 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 and treated with 10 day course of doxycycline on 11/28/2016. He returns with ongoing similar symptoms 3 days later and Lyme titers were obtained, which were negative. He again returned on 12/22/2016 with persistent symptoms of rash and arthralgias as well as fevers and headaches. At that time I D was consulted and stated that rash did not look like [facility] and recommended further workup for possible autoimmune etiology since the symptoms have lasted greater than one month. His rash resolved with steroids and a repeat course of doxycycline was administered. He states he's been taking his antibiotic as prescribed and has 2 days left on his course. His rash had resolved but returned last night. Rash is pruritic but not painful, localized primarily to the abdomen, axilla, with similar lesions to the back and right shin. He also awoke with a fever of 103F this morning which improved with Tylenol. He endorses frequent frontal headaches which also improved with ibuprofen and Tylenol.  He is also complaining of chronic right-sided low back pain with radiation down the lateral aspect of his right lower extremity and into the lateral 3 toes. 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 He has had hip surgery, lumbar spine fusion and SI joint fusion in the past with residual numbness and foot drop. He states pain began after he was bitten by ticks several weeks ago in early July and  has persisted. He denies trauma or falls. No bowel or bladder incontinence or saddle anesthesia. No IV drug use or history of cancer. Pain is constant and sharp. No aggravating or alleviating factors noted. He does have a follow-up appointment with his orthopedist in 2 weeks and is requesting corticosteroid injection which have been helpful to him in the past.   The history is provided by the patient.    Past Medical History:  Diagnosis Date  . Lyme disease   . 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     There are no active problems to display for this patient.   Past Surgical History:  Procedure Laterality Date  . BACK SURGERY    . HIP SURGERY    . TONSILLECTOMY     46 years old       Home Medications    Prior to Admission medications   Medication Sig Start Date End Date Taking? Authorizing Provider  acetaminophen (TYLENOL) 500 MG tablet Take 1,000 mg by mouth every 6 (six) hours as needed for mild pain.     [provider]  albuterol (PROVENTIL HFA;VENTOLIN HFA) 108 (90 Base) MCG/ACT inhaler Inhale 2 puffs into the lungs every 4 (four) hours as needed for wheezing or shortness of breath. 11/11/15   Gilda CreasePollina, Christopher J, MD  doxycycline (VIBRAMYCIN) 100 MG capsule Take 1 capsule (100 mg total) by mouth 2 (two) times daily. 12/30/16 01/06/17  Michela PitcherFawze, Nelida Mandarino A, PA-C  methocarbamol (ROBAXIN) 500 MG tablet Take 1 tablet (500 mg total) by mouth every 8 (  eight) hours as needed for muscle spasms. 10/17/16   Benjiman CorePickering, Nathan, MD  Multiple Vitamin (MULTIVITAMIN) tablet Take 1 tablet by mouth daily.    [provider]  naproxen sodium (ANAPROX) 220 MG tablet Take 220 mg by mouth 2 (two) times daily as needed (pain).    [provider]  ondansetron (ZOFRAN ODT) 4 MG disintegrating tablet Take 1 tablet (4 mg total) by mouth every 8 (eight) hours as needed for nausea or vomiting. 06/07/16   Roxy HorsemanBrowning, Robert, PA-C  oxyCODONE-acetaminophen (PERCOCET/ROXICET) 5-325 MG tablet Take 1-2  tablets by mouth every 6 (six) hours as needed for severe pain. Patient taking differently: Take 0.5 tablets by mouth every 6 (six) hours as needed for severe pain.  10/17/16   Benjiman CorePickering, Nathan, MD  predniSONE (DELTASONE) 20 MG tablet Take 2 tablets (40 mg total) by mouth daily. Patient taking differently: Take 20 mg by mouth 2 (two) times daily with a meal.  12/22/16   Bethel BornGekas, Kelly Marie, PA-C  traMADol (ULTRAM) 50 MG tablet Take 1 tablet (50 mg total) by mouth every 6 (six) hours as needed. Patient taking differently: Take 50 mg by mouth every 6 (six) hours as needed.  12/03/13   Lurene ShadowPhelps, Erin O, PA-C  trolamine salicylate (ASPERCREME) 10 % cream Apply 1 application topically as needed for muscle pain (Hip and Spinal Pain).    [provider]    Family History No family history on file.  Social History Social History  Substance Use Topics  . Smoking status: Current Every Day Smoker    Packs/day: 0.50    Types: Cigarettes  . Smokeless tobacco: Never Used  . Alcohol use Yes     Comment: socially     Allergies   Sulfa antibiotics   Review of Systems Review of Systems  Constitutional: Positive for chills and fever.  Respiratory: Negative for shortness of breath.   Cardiovascular: Negative for chest pain.  Gastrointestinal: Positive for nausea. Negative for abdominal pain and vomiting.  Musculoskeletal: Positive for arthralgias and back pain.  Neurological: Positive for weakness (RLE, chronic, unchanged), numbness (chronic, unchanged) and headaches. Negative for syncope.  Psychiatric/Behavioral: Negative for confusion.  All other systems reviewed and are negative.    Physical Exam Updated Vital Signs BP 126/88   Pulse 89   Temp 97.9 F (36.6 C) (Oral)   Resp 20   Ht 6' (1.829 m)   Wt 109.3 kg (241 lb)   SpO2 97%   BMI 32.69 kg/m   Physical Exam  Constitutional: He is oriented to person, place, and time. He appears well-developed and well-nourished. No  distress.  HENT:  Head: Normocephalic and atraumatic.  Right Ear: External ear normal.  Left Ear: External ear normal.  Mouth/Throat: Oropharynx is clear and moist. No oropharyngeal exudate.  Eyes: Pupils are equal, round, and reactive to light. Conjunctivae and EOM are normal. Right eye exhibits no discharge. Left eye exhibits no discharge. No scleral icterus.  Neck: Normal range of motion. Neck supple. No JVD present. No tracheal deviation present. No thyromegaly present.  Cardiovascular: Normal rate, regular rhythm, normal heart sounds and intact distal pulses.  Exam reveals no gallop and no friction rub.   No murmur heard. 2+ radial and DP/PT pulses bl, negative Homan's bl   Pulmonary/Chest: Effort normal and breath sounds normal. No respiratory distress. He has no wheezes. He has no rales.  Abdominal: Soft. Bowel sounds are normal. He exhibits no distension. There is no tenderness.  Musculoskeletal: He exhibits tenderness.  Limited range of motion of the right ankle and EHL, this is chronic and unchanged per the patient. 4/5 strength of the gastrocs, EHL, and hip flexors ont he right; also chroncic and unchanged. Otherwise 5/5 strength of BUE and BLE major muscle groups. No midline spine TTP, right sided lumbar paraspinal muscle tenderness noted and right SI joint tenderness noted. No deformity, crepitus, or step-off noted. Positive right straight leg raise.  Neurological: He is alert and oriented to person, place, and time. No cranial nerve deficit or sensory deficit.  Fluent speech, no facial droop, right lateral lower extremity with numbness, this is chronic and unchanged per the patient.  Skin: Skin is warm and dry. Rash noted. He is not diaphoretic. No erythema. No pallor.  See attached images. Diffuse maculopapular rash with annular lesions with central clearing noted to the abdomen extending up into the right axilla. A few lesions are noted to the low back and one lesion is noted to  the right anterior shin. No excoriations, no pustules no blisters, no vesicles, no bleeding, no fluctuance or induration. Rash is nontender to palpation.  Psychiatric: He has a normal mood and affect. His behavior is normal.  Nursing note and vitals reviewed.      ED Treatments / Results  Labs (all labs ordered are listed, but only abnormal results are displayed) Labs Reviewed  CBC WITH DIFFERENTIAL/PLATELET - Abnormal; Notable for the following:       Result Value   WBC 11.6 (*)    All other components within normal limits  COMPREHENSIVE METABOLIC PANEL - Abnormal; Notable for the following:    Glucose, Bld 124 (*)    AST 81 (*)    ALT 89 (*)    All other components within normal limits  C-REACTIVE PROTEIN - Abnormal; Notable for the following:    CRP 1.2 (*)    All other components within normal limits  URINALYSIS, ROUTINE W REFLEX MICROSCOPIC  SEDIMENTATION RATE    EKG  EKG Interpretation None       Radiology Dg Chest 2 View  Result Date: 12/30/2016 CLINICAL DATA:  Fever and weakness.  History of tick born illness. EXAM: CHEST  2 VIEW COMPARISON:  12/22/2016 FINDINGS: Heart size is normal. Mediastinal shadows are normal. The lungs are clear. No bronchial thickening. No infiltrate, mass, effusion or collapse. Pulmonary vascularity is normal. No bony abnormality. IMPRESSION: Normal chest Electronically Signed   By: Paulina Fusi M.D.   On: 12/30/2016 11:05   Ct Abdomen Pelvis W Contrast  Result Date: 12/28/2016 CLINICAL DATA:  47 year old male with a history of 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 EXAM: CT ABDOMEN AND PELVIS WITH CONTRAST TECHNIQUE: Multidetector CT imaging of the abdomen and pelvis was performed using the standard protocol following bolus administration of intravenous contrast. CONTRAST:  ISOVUE-300 IOPAMIDOL (ISOVUE-300) INJECTION 61% COMPARISON:  01/01/2009 FINDINGS: Lower chest: No acute abnormality. Hepatobiliary: Diffusely decreased liver attenuation.  Unremarkable appearance of the gallbladder. Pancreas: Unremarkable. No pancreatic ductal dilatation or surrounding inflammatory changes. Spleen: Coarse calcifications of spleen, compatible with prior granulomatous disease Adrenals/Urinary Tract: Unremarkable appearance of adrenal glands. Unremarkable appearance of bilateral kidneys. No hydronephrosis or nephrolithiasis. Unremarkable course of the bilateral ureters. Unremarkable appearance of urinary bladder. Stomach/Bowel: Unremarkable appearance of stomach. Unremarkable small bowel. No abnormal distention or transition point. Normal appendix. No colonic obstruction. No inflammatory changes. Vascular/Lymphatic: Unremarkable appearance of the vasculature with no significant atherosclerotic changes. No adenopathy Reproductive: Unremarkable appearance of the pelvic structures Other: No abdominal wall hernia. Small  fat containing right inguinal hernia. Musculoskeletal: No displaced fracture. Surgical changes of prior anterior approach for discectomy infusion of L5-S1. Partial facetectomy posteriorly. IMPRESSION: No acute finding to account for the patient's abdominal pain. Steatosis. Coarse calcifications of the spleen compatible with prior granulomatous disease. Electronically Signed   By: Gilmer Mor D.O.   On: 12/28/2016 21:24    Procedures Procedures (including critical care time)  Medications Ordered in ED Medications  sodium chloride 0.9 % bolus 1,000 mL (1,000 mLs Intravenous New Bag/Given 12/30/16 1049)  dexamethasone (DECADRON) injection 10 mg (10 mg Intravenous Given 12/30/16 1117)  ondansetron (ZOFRAN) injection 4 mg (4 mg Intravenous Given 12/30/16 1117)     Initial Impression / Assessment and Plan / ED Course  I have reviewed the triage vital signs and the nursing notes.  Pertinent labs & imaging results that were available during my care of the patient were reviewed by me and considered in my medical decision making (see chart for  details).     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 Afebrile, vital signs are stable. No red flag signs concerning for cauda equina syndrome. Back pain appears to be consistent with his typical radiculopathy and he has follow-up with his orthopedist in the near future. His leukocytosis has improved from 8 days ago and decreased to 11.6. No significant electrolyte abnormalities. He does have a modest elevation in both AST and ALT, for which I recommend follow-up with primary care for monitoring. Chest x-ray unremarkable and UA not concerning for UTI or nephrolithiasis. Here has had improvement in his CRP from 8 days ago additionally. His lab work is reassuring and I do not think there is an underlying infection otherwise. I highly recommend follow-up with infectious disease for reevaluation and management of his symptoms. Will discharge with an additional week of doxycycline due to persisting symptoms. Discussed indications for return to the ED. On reevaluation, patient states he is feeling much better after the administration of fluids, Decadron, and Zofran. Pt verbalized understanding of and agreement with plan and is safe for discharge home at this time.  Final Clinical Impressions(s) / ED Diagnoses   Final diagnoses:  [facility] spotted fever  Generalized joint pain  Acute right-sided low back pain with right-sided sciatica    New Prescriptions New Prescriptions   DOXYCYCLINE (VIBRAMYCIN) 100 MG CAPSULE    Take 1 capsule (100 mg total) by mouth 2 (two) times daily.     Jeanie Sewer, PA-C 12/30/16 1234

## 2016-12-30 NOTE — ED Provider Notes (Signed)
Medical screening examination/treatment/procedure(s) were conducted as a shared visit with non-physician practitioner(s) and myself.  I personally evaluated the patient during the encounter.  Pt has been having issues with fever, rash.  Previously diagnosed with RMSF.  Pt describes fever at home, no fever here.  Pt does have an erythematous rash in the left torso.  Not raised, blanches, no petichiae or purpura.  No vesicles.  ?erythema multiforme, ?related to his RMSF  Will check labs.  Recc follow up with PCP or ID   Dorie Rank, MD 12/30/16 [phone]

## 2016-12-30 NOTE — ED Notes (Signed)
Patient transported to X-ray 

## 2016-12-30 NOTE — ED Notes (Signed)
Pt aware of need for urine  

## 2017-01-03 DIAGNOSIS — F32A Depression, unspecified: Secondary | ICD-10-CM | POA: Insufficient documentation

## 2017-01-05 DIAGNOSIS — R6889 Other general symptoms and signs: Secondary | ICD-10-CM | POA: Insufficient documentation

## 2017-01-09 DIAGNOSIS — R7989 Other specified abnormal findings of blood chemistry: Secondary | ICD-10-CM | POA: Insufficient documentation

## 2017-01-10 ENCOUNTER — Encounter (HOSPITAL_COMMUNITY): Payer: Self-pay | Admitting: Emergency Medicine

## 2017-01-10 ENCOUNTER — Emergency Department (HOSPITAL_COMMUNITY)
Admission: EM | Admit: 2017-01-10 | Discharge: 2017-01-10 | Disposition: A | Discharge status: Home / Self Care | Payer: Self-pay | Attending: Emergency Medicine | Admitting: Emergency Medicine

## 2017-01-10 DIAGNOSIS — M62830 Muscle spasm of back: Secondary | ICD-10-CM | POA: Insufficient documentation

## 2017-01-10 DIAGNOSIS — A6920 Lyme disease, unspecified: Secondary | ICD-10-CM | POA: Insufficient documentation

## 2017-01-10 DIAGNOSIS — Z79899 Other long term (current) drug therapy: Secondary | ICD-10-CM | POA: Insufficient documentation

## 2017-01-10 DIAGNOSIS — F17210 Nicotine dependence, cigarettes, uncomplicated: Secondary | ICD-10-CM | POA: Insufficient documentation

## 2017-01-10 LAB — CBC WITH DIFFERENTIAL/PLATELET
BASOS PCT: 0 %
Basophils Absolute: 0 10*3/uL (ref 0.0–0.1)
EOS ABS: 0.1 10*3/uL (ref 0.0–0.7)
Eosinophils Relative: 2 %
HCT: 46.5 % (ref 39.0–52.0)
HEMOGLOBIN: 16.1 g/dL (ref 13.0–17.0)
Lymphocytes Relative: 27 %
Lymphs Abs: 2.4 10*3/uL (ref 0.7–4.0)
MCH: 32 pg (ref 26.0–34.0)
MCHC: 34.6 g/dL (ref 30.0–36.0)
MCV: 92.4 fL (ref 78.0–100.0)
Monocytes Absolute: 0.6 10*3/uL (ref 0.1–1.0)
Monocytes Relative: 7 %
NEUTROS PCT: 64 %
Neutro Abs: 5.7 10*3/uL (ref 1.7–7.7)
Platelets: 173 10*3/uL (ref 150–400)
RBC: 5.03 MIL/uL (ref 4.22–5.81)
RDW: 12.9 % (ref 11.5–15.5)
WBC: 8.8 10*3/uL (ref 4.0–10.5)

## 2017-01-10 LAB — COMPREHENSIVE METABOLIC PANEL
ALBUMIN: 4.3 g/dL (ref 3.5–5.0)
ALT: 60 U/L (ref 17–63)
ANION GAP: 12 (ref 5–15)
AST: 49 U/L — ABNORMAL HIGH (ref 15–41)
Alkaline Phosphatase: 54 U/L (ref 38–126)
BILIRUBIN TOTAL: 0.8 mg/dL (ref 0.3–1.2)
BUN: 8 mg/dL (ref 6–20)
CO2: 22 mmol/L (ref 22–32)
Calcium: 9.8 mg/dL (ref 8.9–10.3)
Chloride: 102 mmol/L (ref 101–111)
Creatinine, Ser: 1.1 mg/dL (ref 0.61–1.24)
GFR calc Af Amer: >60 mL/min (ref >60)
GFR calc non Af Amer: >60 mL/min (ref >60)
GLUCOSE: 118 mg/dL — AB (ref 65–99)
POTASSIUM: 3.8 mmol/L (ref 3.5–5.1)
Sodium: 136 mmol/L (ref 135–145)
TOTAL PROTEIN: 7.3 g/dL (ref 6.5–8.1)

## 2017-01-10 LAB — PROTIME-INR
INR: 0.9
PROTHROMBIN TIME: 12.1 s (ref 11.4–15.2)

## 2017-01-10 LAB — I-STAT CG4 LACTIC ACID, ED
LACTIC ACID, VENOUS: 2.53 mmol/L — AB (ref 0.5–1.9)
Lactic Acid, Venous: 1.04 mmol/L (ref 0.5–1.9)

## 2017-01-10 MED ORDER — NAPROXEN SODIUM 220 MG PO TABS: 220.0000 mg | ORAL_TABLET | Freq: Two times a day (BID) | ORAL | 0 refills | Status: AC | PRN

## 2017-01-10 MED ORDER — ACETAMINOPHEN 500 MG PO TABS: 1000.0000 mg | ORAL_TABLET | Freq: Three times a day (TID) | ORAL | 0 refills | Status: AC

## 2017-01-10 MED ORDER — METHOCARBAMOL 500 MG PO TABS
750.0000 mg | ORAL_TABLET | Freq: Once | ORAL | Status: AC
  Administered 2017-01-10: 750 mg via ORAL
  Filled 2017-01-10: qty 2

## 2017-01-10 MED ORDER — SODIUM CHLORIDE 0.9 % IV BOLUS (SEPSIS)
1000.0000 mL | Freq: Once | INTRAVENOUS | Status: AC
  Administered 2017-01-10: 1000 mL via INTRAVENOUS

## 2017-01-10 MED ORDER — CYCLOBENZAPRINE HCL 10 MG PO TABS: 10.0000 mg | ORAL_TABLET | Freq: Every day | ORAL | 0 refills | Status: AC

## 2017-01-10 MED ORDER — KETOROLAC TROMETHAMINE 15 MG/ML IJ SOLN
15.0000 mg | Freq: Once | INTRAMUSCULAR | Status: AC
  Administered 2017-01-10: 15 mg via INTRAVENOUS
  Filled 2017-01-10: qty 1

## 2017-01-10 NOTE — ED Notes (Signed)
IV attempted x2 without success.

## 2017-01-10 NOTE — ED Notes (Signed)
Pt got Tylenol pta to ed.

## 2017-01-10 NOTE — ED Triage Notes (Signed)
Pt states he is been diagnose with [place] fever few days ago, since then he is been having continuously fever 10/10 left arm pain and diaphoresis.

## 2017-01-10 NOTE — ED Notes (Signed)
Per Lab urine needs to be recollect, specimen was send without label. RN notified.

## 2017-01-10 NOTE — ED Notes (Signed)
Pt c/o NV x 5 episodes right night. Recently treated with doxycycline for 30 days for diagnosis of [place] spotted fever. Pt also reports fever and L arm pain with rash present. Last took tylenol at 0300

## 2017-01-10 NOTE — ED Provider Notes (Signed)
[facility]-EMERGENCY DEPT Provider Note   CSN: [id number] Arrival date & time: 01/10/17  0550     History   Chief Complaint Chief Complaint  Patient presents with  . Arm Pain    HPI [name] is a 46 y.o. male.  HPI  46 year old male with no pertinent past medical history who was recently diagnosed with [facility] spotted fever following a tick bite, which resulted in almost daily fevers and rash that initially began in the palms and soles of the feet. Patient was treated with prolonged course of doxycycline and evaluated by infectious disease. Patient has an upcoming appointment with infectious disease for continued workup and management.  Today he presents for 4 days of left upperback and arm pain that began upon awakening on the initial day. Pain is located in the left parascapular/axillary region and radiates down left arm. Pain is constant but fluctuating in nature. Exacerbated with movement and palpation. Improved by immobility. She has been using heat/cold therapy, topical muscle creams, and Tylenol for pain. These provide some relief. Patient denies any other alleviating or aggravating factors. He denies any chest pains, shortness of breath, worsening cough. He denies any trauma, heavy lifting, compromising positions.   Patient does report that he continues to have daily low-grade temperatures between 99-100. Intermittently he will have some temperatures that range up to 102.3. He reports that these improve with Tylenol. Patient endorses nausea/vomiting, and abdominal discomfort while on doxycycline. He has since completed his regimen of doxycycline.  Currently denies any abdominal pain or diarrhea.  Of note patient is unsure of his tetanus status. Upon review of systems it does not appear that his tetanus was updated during his previous encounters.   Past Medical History:  Diagnosis Date  . Lyme disease   . [facility] spotted fever     There are no active  problems to display for this patient.   Past Surgical History:  Procedure Laterality Date  . BACK SURGERY    . HIP SURGERY    . TONSILLECTOMY     46 years old       Home Medications    Prior to Admission medications   Medication Sig Start Date End Date Taking? Authorizing Provider  albuterol (PROVENTIL HFA;VENTOLIN HFA) 108 (90 Base) MCG/ACT inhaler Inhale 2 puffs into the lungs every 4 (four) hours as needed for wheezing or shortness of breath. 11/11/15  Yes Pollina, Canary Brim, MD  doxycycline (VIBRAMYCIN) 100 MG capsule Take 100 mg by mouth 2 (two) times daily.   Yes [provider]  Multiple Vitamin (MULTIVITAMIN) tablet Take 1 tablet by mouth daily.   Yes [provider]  trolamine salicylate (ASPERCREME) 10 % cream Apply 1 application topically daily as needed for muscle pain (Hip and Spinal Pain).    Yes [provider]  acetaminophen (TYLENOL) 500 MG tablet Take 2 tablets (1,000 mg total) by mouth every 8 (eight) hours. Do not take more than 4000 mg of acetaminophen (Tylenol) in a 24-hour period. Please note that other medicines that you may be prescribed may have Tylenol as well. 01/10/17 01/15/17  Nira Conn, MD  cyclobenzaprine (FLEXERIL) 10 MG tablet Take 1 tablet (10 mg total) by mouth at bedtime. 01/10/17 01/20/17  Nira Conn, MD  methocarbamol (ROBAXIN) 500 MG tablet Take 1 tablet (500 mg total) by mouth every 8 (eight) hours as needed for muscle spasms. Patient not taking: Reported on 01/10/2017 10/17/16   Benjiman Core, MD  naproxen sodium (  ALEVE) 220 MG tablet Take 1 tablet (220 mg total) by mouth 2 (two) times daily between meals as needed. 01/10/17 01/17/17  Nira Conn, MD  ondansetron (ZOFRAN ODT) 4 MG disintegrating tablet Take 1 tablet (4 mg total) by mouth every 8 (eight) hours as needed for nausea or vomiting. Patient not taking: Reported on 01/10/2017 06/07/16   Roxy Horseman, PA-C    oxyCODONE-acetaminophen (PERCOCET/ROXICET) 5-325 MG tablet Take 1-2 tablets by mouth every 6 (six) hours as needed for severe pain. Patient not taking: Reported on 01/10/2017 10/17/16   Benjiman Core, MD  predniSONE (DELTASONE) 20 MG tablet Take 2 tablets (40 mg total) by mouth daily. Patient not taking: Reported on 01/10/2017 12/22/16   Bethel Born, PA-C  traMADol (ULTRAM) 50 MG tablet Take 1 tablet (50 mg total) by mouth every 6 (six) hours as needed. Patient not taking: Reported on 01/10/2017 12/03/13   Rolla Plate    Family History No family history on file.  Social History Social History  Substance Use Topics  . Smoking status: Current Every Day Smoker    Packs/day: 0.50    Types: Cigarettes  . Smokeless tobacco: Never Used  . Alcohol use Yes     Comment: socially     Allergies   Sulfa antibiotics   Review of Systems Review of Systems All other systems are reviewed and are negative for acute change except as noted in the HPI   Physical Exam Updated Vital Signs BP (!) 141/92 (BP Location: Right Arm)   Pulse 96   Temp 97.9 F (36.6 C) (Oral)   Resp 18   Ht 6' (1.829 m)   Wt 109.3 kg (241 lb)   SpO2 99%   BMI 32.69 kg/m   Physical Exam  Constitutional: He is oriented to person, place, and time. He appears well-developed and well-nourished. No distress.  HENT:  Head: Normocephalic and atraumatic.  Nose: Nose normal.  Eyes: Pupils are equal, round, and reactive to light. Conjunctivae and EOM are normal. Right eye exhibits no discharge. Left eye exhibits no discharge. No scleral icterus.  Neck: Normal range of motion. Neck supple.  Cardiovascular: Normal rate and regular rhythm.  Exam reveals no gallop and no friction rub.   No murmur heard. Pulmonary/Chest: Effort normal and breath sounds normal. No stridor. No respiratory distress. He has no rales.    Abdominal: Soft. He exhibits no distension. There is no tenderness.  Musculoskeletal: He  exhibits no edema.       Thoracic back: He exhibits tenderness.       Back:  Lymphadenopathy:    He has no axillary adenopathy.  Neurological: He is alert and oriented to person, place, and time.  Skin: Skin is warm and dry. Rash noted. Rash is macular (to the left chest, axillary, and arm. Also noted in right dorsum of the hand and forearm. Blanching. Nonpruritic. ) and papular (few papules dispersed throughout the lower extremities, blanching, nonpruritic. no surrounding erythema). He is not diaphoretic. No erythema.  Psychiatric: He has a normal mood and affect.  Vitals reviewed.    ED Treatments / Results  Labs (all labs ordered are listed, but only abnormal results are displayed) Labs Reviewed  COMPREHENSIVE METABOLIC PANEL - Abnormal; Notable for the following:       Result Value   Glucose, Bld 118 (*)    AST 49 (*)    All other components within normal limits  I-STAT CG4 LACTIC ACID, ED - Abnormal; Notable for  the following:    Lactic Acid, Venous 2.53 (*)    All other components within normal limits  CULTURE, BLOOD (ROUTINE X 2)  CULTURE, BLOOD (ROUTINE X 2)  CBC WITH DIFFERENTIAL/PLATELET  PROTIME-INR  URINALYSIS, ROUTINE W REFLEX MICROSCOPIC  I-STAT CG4 LACTIC ACID, ED    EKG  EKG Interpretation  Date/Time:  Tuesday January 10 2017 06:09:37 EDT Ventricular Rate:  91 PR Interval:  144 QRS Duration: 88 QT Interval:  344 QTC Calculation: 423 R Axis:   93 Text Interpretation:  Normal sinus rhythm Rightward axis Borderline ECG No significant change since last tracing Confirmed by Drema Pryardama, Pedro [phone]([id number]) on 01/10/2017 9:27:08 AM       Radiology No results found.  Procedures Procedures (including critical care time)  Medications Ordered in ED Medications  sodium chloride 0.9 % bolus 1,000 mL (0 mLs Intravenous Stopped 01/10/17 0927)  ketorolac (TORADOL) 15 MG/ML injection 15 mg (15 mg Intravenous Given 01/10/17 0741)  methocarbamol (ROBAXIN) tablet 750 mg (750  mg Oral Given 01/10/17 0740)     Initial Impression / Assessment and Plan / ED Course  I have reviewed the triage vital signs and the nursing notes.  Pertinent labs & imaging results that were available during my care of the patient were reviewed by me and considered in my medical decision making (see chart for details).     Presentation consistent with muscle strain/spasm of the left parascapular muscles. No respiratory symptoms, chest pain or shortness of breath concerning for pneumonia, ACS, pulmonary embolism, pneumothorax.  Highly unlikely that this is related to local tetanus given the timeframe of his initial bite. This was discussed with Dr. Ninetta LightsHatcher from infectious disease who agreed. Patient was offered tetanus booster but declined.  Patient was treated with NSAIDs and muscle relaxers which provided significant improvement in his symptomatology.  Screening labs were obtained during triage process given [facility]. These labs had improved from his previous and were grossly reassuring. Patient did have elevated lactic acid but I feel this is secondary to dehydration. Cleared with IV fluids.  The patient is safe for discharge with strict return precautions.   Final Clinical Impressions(s) / ED Diagnoses   Final diagnoses:  Muscle spasm of back   Disposition: Discharge  Condition: Good  I have discussed the results, Dx and Tx plan with the patient who expressed understanding and agree(s) with the plan. Discharge instructions discussed at great length. The patient was given strict return precautions who verbalized understanding of the instructions. No further questions at time of discharge.    New Prescriptions   ACETAMINOPHEN (TYLENOL) 500 MG TABLET    Take 2 tablets (1,000 mg total) by mouth every 8 (eight) hours. Do not take more than 4000 mg of acetaminophen (Tylenol) in a 24-hour period. Please note that other medicines that you may be prescribed  may have Tylenol as well.   CYCLOBENZAPRINE (FLEXERIL) 10 MG TABLET    Take 1 tablet (10 mg total) by mouth at bedtime.   NAPROXEN SODIUM (ALEVE) 220 MG TABLET    Take 1 tablet (220 mg total) by mouth 2 (two) times daily between meals as needed.    Follow Up: Barbie BannerWilson, Fred H, MD [address] [place] [address] [phone]  Schedule an appointment as soon as possible for a visit  As needed  Daiva EvesVan Dam, Lisette Grinderornelius N, MD [address] [place] [address] [phone]   as scheduled for this week  Nira Conn, MD 01/10/17 1116

## 2017-01-15 LAB — CULTURE, BLOOD (ROUTINE X 2)
CULTURE: NO GROWTH
CULTURE: NO GROWTH
Special Requests: ADEQUATE
Special Requests: ADEQUATE

## 2017-01-20 DIAGNOSIS — M25512 Pain in left shoulder: Secondary | ICD-10-CM | POA: Insufficient documentation

## 2017-02-10 ENCOUNTER — Other Ambulatory Visit: Payer: Self-pay | Admitting: Family Medicine

## 2017-02-10 DIAGNOSIS — M25512 Pain in left shoulder: Secondary | ICD-10-CM

## 2017-02-13 ENCOUNTER — Ambulatory Visit: Payer: Self-pay | Admitting: Infectious Disease

## 2017-02-20 ENCOUNTER — Ambulatory Visit
Admission: RE | Admit: 2017-02-20 | Discharge: 2017-02-20 | Disposition: A | Discharge status: Home / Self Care | Payer: Worker's Compensation | Source: Ambulatory Visit | Attending: Family Medicine | Admitting: Family Medicine

## 2017-02-20 DIAGNOSIS — M25512 Pain in left shoulder: Secondary | ICD-10-CM

## 2017-02-20 MED ORDER — GADOBENATE DIMEGLUMINE 529 MG/ML IV SOLN
20.0000 mL | Freq: Once | INTRAVENOUS | Status: AC | PRN
  Administered 2017-02-20: 20 mL via INTRAVENOUS

## 2017-02-21 DIAGNOSIS — M24112 Other articular cartilage disorders, left shoulder: Secondary | ICD-10-CM | POA: Insufficient documentation

## 2017-05-30 DIAGNOSIS — I10 Essential (primary) hypertension: Secondary | ICD-10-CM

## 2017-05-30 HISTORY — DX: Essential (primary) hypertension: I10

## 2017-06-12 DIAGNOSIS — M961 Postlaminectomy syndrome, not elsewhere classified: Secondary | ICD-10-CM | POA: Insufficient documentation

## 2017-07-11 ENCOUNTER — Other Ambulatory Visit: Payer: Self-pay | Admitting: Orthopedic Surgery

## 2017-07-11 DIAGNOSIS — M259 Joint disorder, unspecified: Secondary | ICD-10-CM

## 2017-07-26 ENCOUNTER — Ambulatory Visit
Admission: RE | Admit: 2017-07-26 | Discharge: 2017-07-26 | Disposition: A | Discharge status: Home / Self Care | Payer: Worker's Compensation | Source: Ambulatory Visit | Attending: Orthopedic Surgery | Admitting: Orthopedic Surgery

## 2017-07-26 DIAGNOSIS — M259 Joint disorder, unspecified: Secondary | ICD-10-CM

## 2017-08-07 ENCOUNTER — Encounter (HOSPITAL_COMMUNITY): Payer: Self-pay

## 2017-08-07 ENCOUNTER — Other Ambulatory Visit: Payer: Self-pay

## 2017-08-07 ENCOUNTER — Emergency Department (HOSPITAL_COMMUNITY)
Admission: EM | Admit: 2017-08-07 | Discharge: 2017-08-07 | Disposition: A | Discharge status: Home / Self Care | Payer: Self-pay | Attending: Emergency Medicine | Admitting: Emergency Medicine

## 2017-08-07 ENCOUNTER — Emergency Department (HOSPITAL_COMMUNITY): Payer: Self-pay

## 2017-08-07 DIAGNOSIS — Z79899 Other long term (current) drug therapy: Secondary | ICD-10-CM | POA: Insufficient documentation

## 2017-08-07 DIAGNOSIS — R05 Cough: Secondary | ICD-10-CM | POA: Insufficient documentation

## 2017-08-07 DIAGNOSIS — F17210 Nicotine dependence, cigarettes, uncomplicated: Secondary | ICD-10-CM | POA: Insufficient documentation

## 2017-08-07 DIAGNOSIS — R6889 Other general symptoms and signs: Secondary | ICD-10-CM

## 2017-08-07 DIAGNOSIS — R0789 Other chest pain: Secondary | ICD-10-CM | POA: Insufficient documentation

## 2017-08-07 DIAGNOSIS — R509 Fever, unspecified: Secondary | ICD-10-CM | POA: Insufficient documentation

## 2017-08-07 DIAGNOSIS — M791 Myalgia, unspecified site: Secondary | ICD-10-CM | POA: Insufficient documentation

## 2017-08-07 LAB — COMPREHENSIVE METABOLIC PANEL
ALBUMIN: 4.1 g/dL (ref 3.5–5.0)
ALK PHOS: 62 U/L (ref 38–126)
ALT: 63 U/L (ref 17–63)
ANION GAP: 10 (ref 5–15)
AST: 79 U/L — AB (ref 15–41)
BILIRUBIN TOTAL: 0.6 mg/dL (ref 0.3–1.2)
BUN: 9 mg/dL (ref 6–20)
CO2: 21 mmol/L — AB (ref 22–32)
Calcium: 9.4 mg/dL (ref 8.9–10.3)
Chloride: 105 mmol/L (ref 101–111)
Creatinine, Ser: 0.9 mg/dL (ref 0.61–1.24)
GFR calc Af Amer: >60 mL/min (ref >60)
GFR calc non Af Amer: >60 mL/min (ref >60)
GLUCOSE: 120 mg/dL — AB (ref 65–99)
POTASSIUM: 3.8 mmol/L (ref 3.5–5.1)
SODIUM: 136 mmol/L (ref 135–145)
Total Protein: 7.2 g/dL (ref 6.5–8.1)

## 2017-08-07 LAB — URINALYSIS, ROUTINE W REFLEX MICROSCOPIC
BACTERIA UA: NONE SEEN
Bilirubin Urine: NEGATIVE
GLUCOSE, UA: NEGATIVE mg/dL
KETONES UR: NEGATIVE mg/dL
Leukocytes, UA: NEGATIVE
Nitrite: NEGATIVE
PH: 7 (ref 5.0–8.0)
Protein, ur: NEGATIVE mg/dL
SPECIFIC GRAVITY, URINE: 1.013 (ref 1.005–1.030)
SQUAMOUS EPITHELIAL / LPF: NONE SEEN

## 2017-08-07 LAB — CBC WITH DIFFERENTIAL/PLATELET
Basophils Absolute: 0 10*3/uL (ref 0.0–0.1)
Basophils Relative: 1 %
EOS PCT: 2 %
Eosinophils Absolute: 0.2 10*3/uL (ref 0.0–0.7)
HEMATOCRIT: 47.3 % (ref 39.0–52.0)
Hemoglobin: 16 g/dL (ref 13.0–17.0)
LYMPHS PCT: 35 %
Lymphs Abs: 3 10*3/uL (ref 0.7–4.0)
MCH: 31.4 pg (ref 26.0–34.0)
MCHC: 33.8 g/dL (ref 30.0–36.0)
MCV: 92.9 fL (ref 78.0–100.0)
MONO ABS: 0.5 10*3/uL (ref 0.1–1.0)
Monocytes Relative: 6 %
NEUTROS ABS: 4.8 10*3/uL (ref 1.7–7.7)
Neutrophils Relative %: 56 %
PLATELETS: 197 10*3/uL (ref 150–400)
RBC: 5.09 MIL/uL (ref 4.22–5.81)
RDW: 12.3 % (ref 11.5–15.5)
WBC: 8.5 10*3/uL (ref 4.0–10.5)

## 2017-08-07 LAB — I-STAT CG4 LACTIC ACID, ED
LACTIC ACID, VENOUS: 2.01 mmol/L — AB (ref 0.5–1.9)
LACTIC ACID, VENOUS: 1.91 mmol/L — AB (ref 0.5–1.9)

## 2017-08-07 LAB — I-STAT TROPONIN, ED: Troponin i, poc: 0 ng/mL (ref 0.00–0.08)

## 2017-08-07 MED ORDER — ACETAMINOPHEN 325 MG PO TABS
650.0000 mg | ORAL_TABLET | Freq: Once | ORAL | Status: AC
Start: 2017-08-07 — End: 2017-08-07
  Administered 2017-08-07: 650 mg via ORAL
  Filled 2017-08-07: qty 2

## 2017-08-07 MED ORDER — SODIUM CHLORIDE 0.9 % IV BOLUS (SEPSIS)
1000.0000 mL | Freq: Once | INTRAVENOUS | Status: AC
Start: 2017-08-07 — End: 2017-08-07
  Administered 2017-08-07: 1000 mL via INTRAVENOUS

## 2017-08-07 MED ORDER — GUAIFENESIN-CODEINE 100-10 MG/5ML PO SYRP: 5.0000 mL | ORAL_SOLUTION | Freq: Three times a day (TID) | ORAL | 0 refills | Status: DC | PRN

## 2017-08-07 NOTE — ED Triage Notes (Signed)
Pt presents to the ed with complaints of cough, nausea and fever x 3 days. Pt also reports having an injection in sacral joint denies any redness or swelling at the site.

## 2017-08-07 NOTE — ED Notes (Signed)
Pt made aware we need to collect a UA. Pt verbalized understanding.

## 2017-08-07 NOTE — ED Provider Notes (Signed)
[name] EMERGENCY DEPARTMENT Provider Note   CSN: [id number] Arrival date & time: 08/07/17  1152     History   Chief Complaint Chief Complaint  Patient presents with  . Cough    HPI [name] is a 47 y.o. male.  HPI  [name] is a 47yo male with a history of Lyme disease and chronic low back pain following an accident years ago who presents to the emergency department for evaluation of fevers/chills, cough, sore throat, body aches, headache and congestion.  Patient reports his symptoms began 3 days ago.  His wife was recently diagnosed with the flu.  He states that he has had tactile fever with chills at home, particularly at night.  He reports a productive cough of yellow mucus, sometimes coughing so hard that he has posttussive emesis.  He vomited 3 times today, only being able to hold down one bottle of water.  He endorses anterior chest wall pain with cough only, denies chest pain outside of cough.  States that he has a mild bitemporal aching headache as well.  He states "my stomach doesn't hurt, it's just upset."  Tried taking over-the-counter Tylenol and DayQuil for his symptoms with mild relief.  He reports that 7 days ago he had an SI injection by orthopedics.  Denies any redness or rash at the site.  Denies ear pain, neck pain/stiffness, dysphagia, voice change, shortness of breath, diarrhea, dysuria, rash lightheadedness or syncope.  Past Medical History:  Diagnosis Date  . Lyme disease   . [facility] spotted fever     There are no active problems to display for this patient.   Past Surgical History:  Procedure Laterality Date  . BACK SURGERY    . HIP SURGERY    . TONSILLECTOMY     48 years old       Home Medications    Prior to Admission medications   Medication Sig Start Date End Date Taking? Authorizing Provider  albuterol (PROVENTIL HFA;VENTOLIN HFA) 108 (90 Base) MCG/ACT inhaler Inhale 2 puffs into the lungs every 4  (four) hours as needed for wheezing or shortness of breath. 11/11/15   Gilda Crease, MD  doxycycline (VIBRAMYCIN) 100 MG capsule Take 100 mg by mouth 2 (two) times daily.    [provider]  methocarbamol (ROBAXIN) 500 MG tablet Take 1 tablet (500 mg total) by mouth every 8 (eight) hours as needed for muscle spasms. Patient not taking: Reported on 01/10/2017 10/17/16   Benjiman Core, MD  Multiple Vitamin (MULTIVITAMIN) tablet Take 1 tablet by mouth daily.    [provider]  ondansetron (ZOFRAN ODT) 4 MG disintegrating tablet Take 1 tablet (4 mg total) by mouth every 8 (eight) hours as needed for nausea or vomiting. Patient not taking: Reported on 01/10/2017 06/07/16   Roxy Horseman, PA-C  oxyCODONE-acetaminophen (PERCOCET/ROXICET) 5-325 MG tablet Take 1-2 tablets by mouth every 6 (six) hours as needed for severe pain. Patient not taking: Reported on 01/10/2017 10/17/16   Benjiman Core, MD  predniSONE (DELTASONE) 20 MG tablet Take 2 tablets (40 mg total) by mouth daily. Patient not taking: Reported on 01/10/2017 12/22/16   Bethel Born, PA-C  traMADol (ULTRAM) 50 MG tablet Take 1 tablet (50 mg total) by mouth every 6 (six) hours as needed. Patient not taking: Reported on 01/10/2017 12/03/13   Lurene Shadow, PA-C  trolamine salicylate (ASPERCREME) 10 % cream Apply 1 application topically daily as needed for muscle pain (Hip  and Spinal Pain).     [provider]    Family History No family history on file.  Social History Social History   Tobacco Use  . Smoking status: Current Every Day Smoker    Packs/day: 0.50    Types: Cigarettes  . Smokeless tobacco: Never Used  Substance Use Topics  . Alcohol use: Yes    Comment: socially  . Drug use: No     Allergies   Sulfa antibiotics   Review of Systems Review of Systems  Constitutional: Positive for chills and fever.  HENT: Positive for congestion, rhinorrhea and sore throat. Negative for ear  pain, trouble swallowing and voice change.   Eyes: Negative for visual disturbance.  Respiratory: Negative for shortness of breath.   Cardiovascular: Positive for chest pain (with cough).  Gastrointestinal: Positive for vomiting (post tussive emesis). Negative for abdominal pain ("stomach feels upset"), diarrhea and nausea.  Genitourinary: Negative for dysuria, flank pain and frequency.  Musculoskeletal: Positive for myalgias (generalized). Negative for gait problem.  Skin: Negative for color change, rash and wound.  Neurological: Positive for headaches. Negative for syncope and light-headedness.  Psychiatric/Behavioral: Negative for agitation.     Physical Exam Updated Vital Signs BP (!) 132/94 (BP Location: Left Arm)   Pulse 64   Temp 98.8 F (37.1 C)   Resp 17   Wt 109.3 kg (241 lb)   SpO2 100%   BMI 32.69 kg/m   Physical Exam  Constitutional: He is oriented to person, place, and time. He appears well-developed and well-nourished. No distress.  Sitting at bedside in no apparent distress  HENT:  Head: Normocephalic and atraumatic.  Bilateral TMs with good cone of light.  Mucous memories moist.  Posterior oropharynx appears mildly erythematous.  No tonsillar edema or exudate.  Uvula midline.  Airway patent, able to handle oral secretions.  Bilateral nares with clear rhinorrhea.  Eyes: Conjunctivae are normal. Pupils are equal, round, and reactive to light. Right eye exhibits no discharge. Left eye exhibits no discharge.  Neck: Normal range of motion. Neck supple.  Tender bilateral anterior chain cervical adenopathy.  Cardiovascular: Normal rate and regular rhythm. Exam reveals no friction rub.  No murmur heard. Pulmonary/Chest: Effort normal and breath sounds normal. No stridor. No respiratory distress. He has no wheezes. He has no rales.  No respiratory distress, speaking in full sentences.  Lungs clear to auscultation.  Abdominal: Soft. Bowel sounds are normal. There is no  tenderness.  Musculoskeletal: Normal range of motion.  No erythema, warmth or rash over the SI joints.   Neurological: He is alert and oriented to person, place, and time. Coordination normal.  Skin: Skin is warm and dry. He is not diaphoretic.  Psychiatric: He has a normal mood and affect. His behavior is normal.  Nursing note and vitals reviewed.    ED Treatments / Results  Labs (all labs ordered are listed, but only abnormal results are displayed) Labs Reviewed  COMPREHENSIVE METABOLIC PANEL - Abnormal; Notable for the following components:      Result Value   CO2 21 (*)    Glucose, Bld 120 (*)    AST 79 (*)    All other components within normal limits  URINALYSIS, ROUTINE W REFLEX MICROSCOPIC - Abnormal; Notable for the following components:   Hgb urine dipstick SMALL (*)    All other components within normal limits  I-STAT CG4 LACTIC ACID, ED - Abnormal; Notable for the following components:   Lactic Acid, Venous 2.01 (*)  All other components within normal limits  I-STAT CG4 LACTIC ACID, ED - Abnormal; Notable for the following components:   Lactic Acid, Venous 1.91 (*)    All other components within normal limits  CBC WITH DIFFERENTIAL/PLATELET  I-STAT TROPONIN, ED    EKG  EKG Interpretation  Date/Time:  Monday August 07 2017 12:04:50 EDT Ventricular Rate:  80 PR Interval:  148 QRS Duration: 94 QT Interval:  370 QTC Calculation: 426 R Axis:   86 Text Interpretation:  Normal sinus rhythm no acute ST/T changes no significant change since Aug 2018 Confirmed by Pricilla LovelessGoldston, Scott [phone]([id number]) on 08/07/2017 2:59:31 PM       Radiology Dg Chest 2 View  Result Date: 08/07/2017 CLINICAL DATA:  Cough.  Community acquired pneumonia. EXAM: CHEST - 2 VIEW COMPARISON:  None. FINDINGS: The heart size and mediastinal contours are within normal limits. Both lungs are clear. The visualized skeletal structures are unremarkable. IMPRESSION: No active cardiopulmonary disease.  Electronically Signed   By: Signa Kellaylor  Stroud M.D.   On: 08/07/2017 13:10    Procedures Procedures (including critical care time)  Medications Ordered in ED Medications  sodium chloride 0.9 % bolus 1,000 mL (0 mLs Intravenous Stopped 08/07/17 1700)  acetaminophen (TYLENOL) tablet 650 mg (650 mg Oral Given 08/07/17 1618)     Initial Impression / Assessment and Plan / ED Course  I have reviewed the triage vital signs and the nursing notes.  Pertinent labs & imaging results that were available during my care of the patient were reviewed by me and considered in my medical decision making (see chart for details).    Patient with symptoms consistent with influenza.  Vitals are stable.  No signs of dehydration, tolerating PO's.  Lungs are clear. Results reviewed. CXR without acute abnormality. Presentation non-concerning for ACS given CP present only with cough (labs ordered prior to me seeing patient), troponin negative and EKG non-ischemic.  CBC and CMP unremarkable.  UA without evidence of infection.  Initially his lactic acid was mildly elevated at 2.01, likely due to dehydration in the setting of recent vomiting and poor p.o. intake.  Patient given 1 L fluid bolus and Tylenol.  Reports subsequent improvement in his symptoms.    Discussed the cost versus benefit of Tamiflu treatment with the patient.  The patient understands that symptoms are greater than the recommended 24-48 hour window of treatment.  Patient will be discharged with instructions to orally hydrate, rest, and use over-the-counter medications such as anti-inflammatories ibuprofen and Aleve for muscle aches and Tylenol for fever.  Patient will also be given a cough suppressant.  His blood sugar was somewhat elevated (BG 120), counseled him to have this rechecked with his primary care doctor. Counseled patient on return precautions and he agrees and voiced understanding to the above plan.  He has no complaints prior to discharge.  Final  Clinical Impressions(s) / ED Diagnoses   Final diagnoses:  Flu-like symptoms    ED Discharge Orders        Ordered    guaiFENesin-codeine [facility](ROBITUSSIN AC) 100-10 MG/5ML syrup  3 times daily PRN     08/07/17 1707       Kellie ShropshireShrosbree, Aaliyah Cancro J, PA-C 08/08/17 [id number]    Pricilla LovelessGoldston, Scott, MD 08/10/17 [phone]

## 2017-08-07 NOTE — Discharge Instructions (Signed)
Your blood work and chest x-ray was reassuring.   Your symptoms are consistent with flu.  Please drink plenty of fluids and get rest at home.  I have written you a prescription for cough medicine with codeine.  This medicine can make you drowsy so please do not drive or work while taking it.  Please take Tylenol for fever and ibuprofen for body aches.  Your blood pressure was somewhat elevated in the ER today, please have this rechecked with your regular doctor.  Return to the emergency department if you have vomiting that will not stop, trouble breathing or have any new or concerning symptoms.

## 2017-09-18 ENCOUNTER — Emergency Department (HOSPITAL_COMMUNITY): Payer: Self-pay

## 2017-09-18 ENCOUNTER — Encounter (HOSPITAL_COMMUNITY): Payer: Self-pay | Admitting: Emergency Medicine

## 2017-09-18 ENCOUNTER — Emergency Department (HOSPITAL_COMMUNITY)
Admission: EM | Admit: 2017-09-18 | Discharge: 2017-09-18 | Disposition: A | Discharge status: Home / Self Care | Payer: Self-pay | Attending: Emergency Medicine | Admitting: Emergency Medicine

## 2017-09-18 ENCOUNTER — Other Ambulatory Visit: Payer: Self-pay

## 2017-09-18 DIAGNOSIS — R079 Chest pain, unspecified: Secondary | ICD-10-CM

## 2017-09-18 DIAGNOSIS — Z79899 Other long term (current) drug therapy: Secondary | ICD-10-CM | POA: Insufficient documentation

## 2017-09-18 DIAGNOSIS — F17210 Nicotine dependence, cigarettes, uncomplicated: Secondary | ICD-10-CM | POA: Insufficient documentation

## 2017-09-18 DIAGNOSIS — R0789 Other chest pain: Secondary | ICD-10-CM | POA: Insufficient documentation

## 2017-09-18 LAB — BASIC METABOLIC PANEL
ANION GAP: 9 (ref 5–15)
BUN: 8 mg/dL (ref 6–20)
CALCIUM: 9.5 mg/dL (ref 8.9–10.3)
CO2: 21 mmol/L — AB (ref 22–32)
Chloride: 106 mmol/L (ref 101–111)
Creatinine, Ser: 0.99 mg/dL (ref 0.61–1.24)
GFR calc Af Amer: >60 mL/min (ref >60)
GFR calc non Af Amer: >60 mL/min (ref >60)
Glucose, Bld: 120 mg/dL — ABNORMAL HIGH (ref 65–99)
POTASSIUM: 3.4 mmol/L — AB (ref 3.5–5.1)
Sodium: 136 mmol/L (ref 135–145)

## 2017-09-18 LAB — CBC
HCT: 46 % (ref 39.0–52.0)
HEMOGLOBIN: 15.8 g/dL (ref 13.0–17.0)
MCH: 31.2 pg (ref 26.0–34.0)
MCHC: 34.3 g/dL (ref 30.0–36.0)
MCV: 90.9 fL (ref 78.0–100.0)
Platelets: 193 10*3/uL (ref 150–400)
RBC: 5.06 MIL/uL (ref 4.22–5.81)
RDW: 12.7 % (ref 11.5–15.5)
WBC: 8.1 10*3/uL (ref 4.0–10.5)

## 2017-09-18 LAB — I-STAT TROPONIN, ED
TROPONIN I, POC: 0 ng/mL (ref 0.00–0.08)
Troponin i, poc: 0 ng/mL (ref 0.00–0.08)

## 2017-09-18 LAB — CK: Total CK: 200 U/L (ref 49–397)

## 2017-09-18 MED ORDER — HYDROCODONE-ACETAMINOPHEN 5-325 MG PO TABS
1.0000 | ORAL_TABLET | Freq: Once | ORAL | Status: AC
  Administered 2017-09-18: 1 via ORAL
  Filled 2017-09-18: qty 1

## 2017-09-18 NOTE — Discharge Instructions (Addendum)
Take medication as prescribed. Follow-up with your orthopedic doctor for further evaluation and management of your pain. Return to the emergency room if you develop difficulty breathing, vomiting, or any new or concerning symptoms.

## 2017-09-18 NOTE — ED Triage Notes (Signed)
Pt c/o midsternal cp onset last night, pt states pain began in L shoulder radiating down L arm. Pt states he need sx on shoulder, pt reports perfuse sweating in any position. +lightheaded, +nausea.

## 2017-09-18 NOTE — ED Notes (Signed)
Pt stable, ambulatory, states understanding of discharge instructions 

## 2017-09-18 NOTE — ED Provider Notes (Signed)
[name] EMERGENCY DEPARTMENT Provider Note   CSN: [id number] Arrival date & time: 09/18/17  4098     History   Chief Complaint Chief Complaint  Patient presents with  . Chest Pain    HPI [name] is a 47 y.o. male presenting for evaluation of chest pain.  Pt states that last night he developed chest pain, L shoulder pain, and L arm pain.  Left arm pain has progressed and has extended into his wrist.  He also reports pain of his left shoulder blade, worse with inspriation.  He took a tramadol at 4:00 this morning without improvement of his symptoms.  He reports associated diaphoresis, which has improved.  He denies shortness of breath, dizziness, fevers, chills, nausea, vomiting, abdominal pain, urinary symptoms, normal bowel movements. He denies numbness, tingling, or dropping things from his hand. He denies leg pain or swelling.  No recent travel, surgeries, immobilization, or trauma.  Denies history of PE/DVT, history of cancer.  He is not on blood thinners.  No cardiac history.  He smokes cigarettes, denies alcohol or drug use. Fam h/o cardiac history- dad with MI at 65 years old.  He reports a mild cough for the past several days.    HPI  Past Medical History:  Diagnosis Date  . Lyme disease   . [facility] spotted fever     There are no active problems to display for this patient.   Past Surgical History:  Procedure Laterality Date  . BACK SURGERY    . HIP SURGERY    . TONSILLECTOMY     47 years old        Home Medications    Prior to Admission medications   Medication Sig Start Date End Date Taking? Authorizing Provider  diclofenac sodium (VOLTAREN) 1 % GEL Apply 2 g topically 4 (four) times daily.   Yes [provider]  gabapentin (NEURONTIN) 300 MG capsule Take 300 mg by mouth 3 (three) times daily.   Yes [provider]  methocarbamol (ROBAXIN) 500 MG tablet Take 1 tablet (500 mg total) by mouth every 8  (eight) hours as needed for muscle spasms. 10/17/16  Yes Benjiman Core, MD  Multiple Vitamin (MULTIVITAMIN) tablet Take 1 tablet by mouth daily.   Yes [provider]  traMADol (ULTRAM) 50 MG tablet Take 1 tablet (50 mg total) by mouth every 6 (six) hours as needed. 12/03/13  Yes Phelps, Erin O, PA-C  albuterol (PROVENTIL HFA;VENTOLIN HFA) 108 (90 Base) MCG/ACT inhaler Inhale 2 puffs into the lungs every 4 (four) hours as needed for wheezing or shortness of breath. Patient not taking: Reported on 09/18/2017 11/11/15   Gilda Crease, MD  guaiFENesin-codeine [facility]) 100-10 MG/5ML syrup Take 5 mLs by mouth 3 (three) times daily as needed for cough. Patient not taking: Reported on 09/18/2017 08/07/17   Kellie Shropshire, PA-C  ondansetron (ZOFRAN ODT) 4 MG disintegrating tablet Take 1 tablet (4 mg total) by mouth every 8 (eight) hours as needed for nausea or vomiting. Patient not taking: Reported on 01/10/2017 06/07/16   Roxy Horseman, PA-C  oxyCODONE-acetaminophen (PERCOCET/ROXICET) 5-325 MG tablet Take 1-2 tablets by mouth every 6 (six) hours as needed for severe pain. Patient not taking: Reported on 01/10/2017 10/17/16   Benjiman Core, MD  predniSONE (DELTASONE) 20 MG tablet Take 2 tablets (40 mg total) by mouth daily. Patient not taking: Reported on 01/10/2017 12/22/16   Bethel Born, PA-C    Family History  No family history on file.  Social History Social History   Tobacco Use  . Smoking status: Current Every Day Smoker    Packs/day: 0.50    Types: Cigarettes  . Smokeless tobacco: Never Used  Substance Use Topics  . Alcohol use: Yes    Comment: socially  . Drug use: No     Allergies   Sulfa antibiotics   Review of Systems Review of Systems  Constitutional: Positive for diaphoresis.  Cardiovascular: Positive for chest pain.  All other systems reviewed and are negative.    Physical Exam Updated Vital Signs BP 111/75   Pulse (!) 48   Temp  98 F (36.7 C) (Oral)   Resp 16   Ht 6' (1.829 m)   Wt 108 kg (238 lb)   SpO2 96%   BMI 32.28 kg/m   Physical Exam  Constitutional: He is oriented to person, place, and time. He appears well-developed and well-nourished. No distress.  Pt appears uncomfortable due to pain, no apparent distress.  HENT:  Head: Normocephalic and atraumatic.  Eyes: Pupils are equal, round, and reactive to light. Conjunctivae and EOM are normal.  Neck: Normal range of motion. Neck supple.  Cardiovascular: Normal rate, regular rhythm and intact distal pulses.  Pulmonary/Chest: Effort normal and breath sounds normal. No respiratory distress. He has no wheezes. He exhibits tenderness.  Tenderness to palpation of left-sided chest wall anterior and posterior.  Abdominal: Soft. He exhibits no distension and no mass. There is no tenderness. There is no guarding.  Musculoskeletal: Normal range of motion. He exhibits tenderness. He exhibits no edema or deformity.  Tenderness to palpation of radial L elbow and ulnar wrist. +Tinel's.  Grip strength intact bilaterally.  Sensation intact bilaterally.  Color and warmth equal bilaterally.  Radial pulses intact bilaterally.  No tenderness palpation of midline C-spine, no step-offs.  No tenderness palpation midline back.  Neurological: He is alert and oriented to person, place, and time. No sensory deficit.  Skin: Skin is warm and dry.  Psychiatric: He has a normal mood and affect.  Nursing note and vitals reviewed.    ED Treatments / Results  Labs (all labs ordered are listed, but only abnormal results are displayed) Labs Reviewed  BASIC METABOLIC PANEL - Abnormal; Notable for the following components:      Result Value   Potassium 3.4 (*)    CO2 21 (*)    Glucose, Bld 120 (*)    All other components within normal limits  CBC  CK  I-STAT TROPONIN, ED  I-STAT TROPONIN, ED    EKG EKG Interpretation  Date/Time:  Monday September 18 2017 06:47:18 EDT Ventricular  Rate:  82 PR Interval:  154 QRS Duration: 96 QT Interval:  374 QTC Calculation: 436 R Axis:   74 Text Interpretation:  Normal sinus rhythm Normal ECG Confirmed by Margarita Grizzleay, Danielle [phone]([id number]) on 09/18/2017 2:15:49 PM   Radiology Dg Chest 2 View  Result Date: 09/18/2017 CLINICAL DATA:  Chest pain EXAM: CHEST - 2 VIEW COMPARISON:  08/07/2017 FINDINGS: The heart size and mediastinal contours are within normal limits. Both lungs are clear. Mild apical scarring bilaterally. The visualized skeletal structures are unremarkable. IMPRESSION: No active cardiopulmonary disease. Electronically Signed   By: Marlan Palauharles  Clark M.D.   On: 09/18/2017 07:39    Procedures Procedures (including critical care time)  Medications Ordered in ED Medications  HYDROcodone-acetaminophen (NORCO/VICODIN) 5-325 MG per tablet 1 tablet (1 tablet Oral Given 09/18/17 1413)     Initial Impression /  Assessment and Plan / ED Course  I have reviewed the triage vital signs and the nursing notes.  Pertinent labs & imaging results that were available during my care of the patient were reviewed by me and considered in my medical decision making (see chart for details).     Pt resenting for evaluation of left-sided chest pain.  Physical exam shows patient who appears her comfortable due to pain, but in no apparent distress.  Pain is reproducible.  Initial labs reassuring, and negative troponin.  Chest x-ray without acute infection.  EKG without signs of STEMI.  Heart score low, doubt ACS.  PERC negative, doubt PE. Pain likely due to muscular or nerve pain, will five norco for pain relief and obtain repeat troponin.   Delta troponin negative.  Patient reports pain is improved.  Will follow up with orthopedics for further evaluation and management of pain.  At this time, patient appears safe for discharge.  Return precautions given.  Patient states he understands and agrees to plan.   Final Clinical Impressions(s) / ED Diagnoses    Final diagnoses:  Left-sided chest pain  Atypical chest pain    ED Discharge Orders    None       Alveria Apley, PA-C 09/18/17 1459    Margarita Grizzle, MD 09/18/17 2147

## 2017-09-27 ENCOUNTER — Emergency Department (HOSPITAL_COMMUNITY)
Admission: EM | Admit: 2017-09-27 | Discharge: 2017-09-27 | Disposition: A | Discharge status: Home / Self Care | Payer: Self-pay | Attending: Emergency Medicine | Admitting: Emergency Medicine

## 2017-09-27 ENCOUNTER — Encounter (HOSPITAL_COMMUNITY): Payer: Self-pay | Admitting: Emergency Medicine

## 2017-09-27 ENCOUNTER — Other Ambulatory Visit: Payer: Self-pay

## 2017-09-27 DIAGNOSIS — F17210 Nicotine dependence, cigarettes, uncomplicated: Secondary | ICD-10-CM | POA: Insufficient documentation

## 2017-09-27 DIAGNOSIS — G8929 Other chronic pain: Secondary | ICD-10-CM | POA: Insufficient documentation

## 2017-09-27 DIAGNOSIS — M25551 Pain in right hip: Secondary | ICD-10-CM

## 2017-09-27 DIAGNOSIS — M533 Sacrococcygeal disorders, not elsewhere classified: Secondary | ICD-10-CM | POA: Insufficient documentation

## 2017-09-27 DIAGNOSIS — R252 Cramp and spasm: Secondary | ICD-10-CM | POA: Insufficient documentation

## 2017-09-27 DIAGNOSIS — Z79899 Other long term (current) drug therapy: Secondary | ICD-10-CM | POA: Insufficient documentation

## 2017-09-27 MED ORDER — OXYCODONE-ACETAMINOPHEN 5-325 MG PO TABS
2.0000 | ORAL_TABLET | Freq: Once | ORAL | Status: AC
  Administered 2017-09-27: 2 via ORAL
  Filled 2017-09-27: qty 2

## 2017-09-27 MED ORDER — METHOCARBAMOL 500 MG PO TABS
1000.0000 mg | ORAL_TABLET | Freq: Once | ORAL | Status: AC
  Administered 2017-09-27: 1000 mg via ORAL
  Filled 2017-09-27: qty 2

## 2017-09-27 MED ORDER — KETOROLAC TROMETHAMINE 30 MG/ML IJ SOLN
30.0000 mg | Freq: Once | INTRAMUSCULAR | Status: AC
  Administered 2017-09-27: 30 mg via INTRAVENOUS
  Filled 2017-09-27: qty 1

## 2017-09-27 NOTE — ED Provider Notes (Signed)
[name] EMERGENCY DEPARTMENT Provider Note   CSN: [id number] Arrival date & time: 09/27/17  1324     History   Chief Complaint Chief Complaint  Patient presents with  . Hip Pain    HPI KHORI ROSEVEAR III is a 47 y.o. male.  LAYTON NAVES III is a 47 y.o. 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  Patient reports symptoms started last night when he was trying to get into bed he had sudden onset of right hip pain described as sharp shooting pains radiating down the posterior thigh from the buttocks into the popliteal fossa.  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  Patient reports the pain is aggravated by sitting and alleviated by standing.  He did not get much rest last night due to the pain.  Patient reports he took a dose of his home tramadol last night did not take his home Robaxin, took his home gabapentin this morning has not tried anything else to treat his symptoms..  Patient reports he called his orthopedist Dr. Shon Baton office to notify them that he was coming to the emergency department.  Patient reports he has been unable to get his surgery approved through [employer], he has been referred to pain management but has not been seen yet.   Patient denies any associated fevers or chills, no new numbness or weakness, no loss of bowel or bladder control or saddle anesthesia, no history of IV drug use or malignancy.     Past Medical History:  Diagnosis Date  . Lyme disease   . [facility] spotted fever     There are no active problems to display for this patient.   Past Surgical History:  Procedure Laterality Date  . BACK SURGERY    . HIP SURGERY    .  TONSILLECTOMY     47 years old        Home Medications    Prior to Admission medications   Medication Sig Start Date End Date Taking? Authorizing Provider  albuterol (PROVENTIL HFA;VENTOLIN HFA) 108 (90 Base) MCG/ACT inhaler Inhale 2 puffs into the lungs every 4 (four) hours as needed for wheezing or shortness of breath. Patient not taking: Reported on 09/18/2017 11/11/15   Gilda Crease, MD  diclofenac sodium (VOLTAREN) 1 % GEL Apply 2 g topically 4 (four) times daily.    [provider]  gabapentin (NEURONTIN) 300 MG capsule Take 300 mg by mouth 3 (three) times daily.    [provider]  guaiFENesin-codeine (ROBITUSSIN AC) 100-10 MG/5ML syrup Take 5 mLs by mouth 3 (three) times daily as needed for cough. Patient not taking: Reported on 09/18/2017 08/07/17   Kellie Shropshire, PA-C  methocarbamol (ROBAXIN) 500 MG tablet Take 1 tablet (500 mg total) by mouth every 8 (eight) hours as needed for muscle spasms. 10/17/16   Benjiman Core, MD  Multiple Vitamin (MULTIVITAMIN) tablet Take 1 tablet by mouth daily.    [provider]  ondansetron (ZOFRAN ODT) 4 MG disintegrating tablet Take 1 tablet (4 mg total) by mouth every 8 (eight) hours as needed for nausea or vomiting. Patient not taking: Reported on 01/10/2017 06/07/16   Dahlia Client,  Molly Maduro, PA-C  oxyCODONE-acetaminophen (PERCOCET/ROXICET) 5-325 MG tablet Take 1-2 tablets by mouth every 6 (six) hours as needed for severe pain. Patient not taking: Reported on 01/10/2017 10/17/16   Benjiman Core, MD  predniSONE (DELTASONE) 20 MG tablet Take 2 tablets (40 mg total) by mouth daily. Patient not taking: Reported on 01/10/2017 12/22/16   Bethel Born, PA-C  traMADol (ULTRAM) 50 MG tablet Take 1 tablet (50 mg total) by mouth every 6 (six) hours as needed. 12/03/13   Lurene Shadow, PA-C    Family History No family history on file.  Social History Social History   Tobacco Use  . Smoking status: Current Every  Day Smoker    Packs/day: 0.50    Types: Cigarettes  . Smokeless tobacco: Never Used  Substance Use Topics  . Alcohol use: Yes    Comment: socially  . Drug use: No     Allergies   Sulfa antibiotics   Review of Systems Review of Systems  Constitutional: Negative for chills and fever.  HENT: Negative for congestion, sinus pain and sore throat.   Eyes: Negative for visual disturbance.  Respiratory: Negative for shortness of breath.   Cardiovascular: Negative for chest pain.  Gastrointestinal: Negative for abdominal pain, constipation, nausea and vomiting.  Genitourinary: Negative for difficulty urinating, dysuria and frequency.  Musculoskeletal: Positive for arthralgias and back pain. Negative for joint swelling.  Skin: Negative for color change and rash.  Neurological: Negative for weakness and numbness.     Physical Exam Updated Vital Signs BP (!) 132/96 (BP Location: Left Arm)   Pulse (!) 113   Temp 98 F (36.7 C) (Oral)   Resp 16   Ht 6' (1.829 m)   Wt 106.1 kg (234 lb)   SpO2 100%   BMI 31.74 kg/m   Physical Exam  Constitutional: He is oriented to person, place, and time. He appears well-developed and well-nourished. No distress.  Patient appears to be in pain  HENT:  Head: Normocephalic and atraumatic.  Eyes: Right eye exhibits no discharge. Left eye exhibits no discharge.  Neck: Neck supple.  Cardiovascular: Normal rate, regular rhythm, normal heart sounds and intact distal pulses.  Pulmonary/Chest: Effort normal and breath sounds normal. No stridor. No respiratory distress. He has no wheezes. He has no rales.  Abdominal: Soft. Bowel sounds are normal. He exhibits no distension and no mass. There is no tenderness. There is no guarding.  Musculoskeletal:  Spasm to the right calf and thigh muscles noted, tenderness to palpation but no palpable deformity, erythema, warmth or skin changes,  Patient can bend and extend the knee ankle and hip with pain but no  difficulty.  Focal tenderness over the right SI joint no midline spinal tenderness.  Patient able to bear weight and stand from a chair without assistance and is ambulatory with some discomfort  Neurological: He is alert and oriented to person, place, and time. Coordination normal.  Speech is clear, able to follow commands CN III-XII intact Normal strength in upper and lower extremities bilaterally including dorsiflexion and plantar flexion, strong and equal grip strength Sensation normal to light and sharp touch, patient has some baseline numbness over the right lateral lower leg which is unchanged Moves extremities without ataxia, coordination intact  Skin: Skin is warm and dry. Capillary refill takes less than 2 seconds. He is not diaphoretic.  Psychiatric: He has a normal mood and affect. His behavior is normal.  Nursing note and vitals reviewed.    ED Treatments /  Results  Labs (all labs ordered are listed, but only abnormal results are displayed) Labs Reviewed - No data to display  EKG None  Radiology No results found.  Procedures Procedures (including critical care time)  Medications Ordered in ED Medications  oxyCODONE-acetaminophen (PERCOCET/ROXICET) 5-325 MG per tablet 2 tablet (2 tablets Oral Given 09/27/17 1529)  ketorolac (TORADOL) 30 MG/ML injection 30 mg (30 mg Intravenous Given 09/27/17 1531)  methocarbamol (ROBAXIN) tablet 1,000 mg (1,000 mg Oral Given 09/27/17 1529)     Initial Impression / Assessment and Plan / ED Course  I have reviewed the triage vital signs and the nursing notes.  Pertinent labs & imaging results that were available during my care of the patient were reviewed by me and considered in my medical decision making (see chart for details).  Patient presents for acute exacerbation of his chronic right hip and SI joint pain.  Patient took his home tramadol but is not taking any of his muscle relaxers, reports pain is shooting down the legs and causing  some cramping and muscle spasm throughout the right leg.  Patient is followed by Dr. Shon Baton with [facility] orthopedics.  Patient initially tachycardic and slightly hypertensive I feel this is likely due to pain.  On exam he does not have any weakness or new neurologic deficit, he does have some chronic numbness since his surgery which is unchanged over the lateral aspect of the right lower leg.  No red flag symptoms of saddle anesthesia, loss of bowel or bladder control to suggest cauda equina syndrome.  Patient is ambulatory and has full range of motion of all joints of the right lower extremity with some discomfort.  Treat with Toradol, Percocet and Robaxin.  On reevaluation patient's pain is much improved.  Touched base with Dr. Shon Baton with [facility] orthopedics who can see the patient in the office tomorrow but agrees that he is stable for discharge.  Instructed patient to continue with his home pain medications, and encouraged him to use his muscle relaxers especially when he is experiencing spasms like this.  Precautions discussed.  Patient expresses understanding and is in agreement with plan.  Final Clinical Impressions(s) / ED Diagnoses   Final diagnoses:  Pain of right hip joint  Chronic right SI joint pain    ED Discharge Orders    None       Dartha Lodge, [place] 09/27/17 1745    Nira Conn, MD 09/28/17 1335

## 2017-09-27 NOTE — Discharge Instructions (Signed)
Please continue with your home pain medications, make sure you are taking her Robaxin as this is going to provide the most help with muscle spasm.  You can follow-up with Dr. Shon Baton and his PA in the office tomorrow.  Return to the ED for new numbness or weakness, inability to walk, significantly worsening pain, loss of control of your bowel or bladder or other new or concerning symptoms.

## 2017-09-27 NOTE — ED Provider Notes (Signed)
Patient placed in Quick Look pathway, seen and evaluated   Chief Complaint: right hip pain  HPI:   Presents with sudden onset right hip pain. 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 He has hx of same in the past but this is more severe.  Describes sharp shooting pains radiating down the posterior thigh from the buttocks to the popliteal fossa. No injury or trauma, no new numbness or weakness. No fever, chills or other symptoms. Was in his usual state of health up until last night in bed starting to experience severe pain. Aggravated by sitting, alleviated by standing. Didn't get much sleep due to pain. No hx of IV drug use, malignancy , loss of bowel or bladder function.  He is waiting for pain management to get scheduled. Has taken gabapentin but not taken his tramadol today.  ROS: no weakness, numbness, fever, chills  Physical Exam:   Gen: No distress  Neuro: Awake and Alert  Skin: Warm    Focused Exam: patient reports decreased sensation to light touch of the lateral lower leg which is baseline for him. Spasm to the right calf. Nontoxic appearing, patient can stand from chair without assistance and is ambulatory.   Initiation of care has begun. The patient has been counseled on the process, plan, and necessity for staying for the completion/evaluation, and the remainder of the medical screening examination   Gregary Cromer 09/27/17 1347    Little, Ambrose Finland, MD 09/27/17 [phone]

## 2017-09-27 NOTE — ED Triage Notes (Signed)
Pt states he has pain to right hip, sates he injured it a few months ago and is waiting to have a fusion. States last night no new injury, states he took Gabapentin 1 hour ago for the pain that is shooting down his right leg. Pain to right butt cheek. Describes an electrical pain.

## 2017-10-03 ENCOUNTER — Emergency Department (HOSPITAL_COMMUNITY): Admission: EM | Admit: 2017-10-03 | Discharge: 2017-10-03 | Discharge status: Against Medical Advice | Payer: Self-pay

## 2017-10-03 NOTE — ED Notes (Signed)
Called pt for triage x2, no response.

## 2017-10-08 ENCOUNTER — Emergency Department (HOSPITAL_COMMUNITY)
Admission: EM | Admit: 2017-10-08 | Discharge: 2017-10-08 | Disposition: A | Discharge status: Home / Self Care | Payer: Self-pay | Attending: Emergency Medicine | Admitting: Emergency Medicine

## 2017-10-08 ENCOUNTER — Other Ambulatory Visit: Payer: Self-pay

## 2017-10-08 ENCOUNTER — Emergency Department (HOSPITAL_COMMUNITY): Payer: Self-pay

## 2017-10-08 ENCOUNTER — Encounter (HOSPITAL_COMMUNITY): Payer: Self-pay | Admitting: *Deleted

## 2017-10-08 DIAGNOSIS — Z79899 Other long term (current) drug therapy: Secondary | ICD-10-CM | POA: Insufficient documentation

## 2017-10-08 DIAGNOSIS — F17210 Nicotine dependence, cigarettes, uncomplicated: Secondary | ICD-10-CM | POA: Insufficient documentation

## 2017-10-08 DIAGNOSIS — M25512 Pain in left shoulder: Secondary | ICD-10-CM | POA: Insufficient documentation

## 2017-10-08 MED ORDER — LYRICA 75 MG PO CAPS: 150.0000 mg | ORAL_CAPSULE | Freq: Two times a day (BID) | ORAL | 0 refills | Status: DC

## 2017-10-08 MED ORDER — HYDROMORPHONE HCL 2 MG/ML IJ SOLN
1.0000 mg | Freq: Once | INTRAMUSCULAR | Status: AC
  Administered 2017-10-08: 1 mg via INTRAMUSCULAR
  Filled 2017-10-08: qty 1

## 2017-10-08 MED ORDER — LIDOCAINE 5 % EX PTCH
1.0000 | MEDICATED_PATCH | CUTANEOUS | Status: DC
  Administered 2017-10-08: 1 via TRANSDERMAL
  Filled 2017-10-08 (×2): qty 1

## 2017-10-08 MED ORDER — METHYLPREDNISOLONE 4 MG PO TBPK: ORAL_TABLET | ORAL | 0 refills | Status: DC

## 2017-10-08 NOTE — ED Triage Notes (Signed)
He had an xray Wednesday at [facility]

## 2017-10-08 NOTE — ED Provider Notes (Signed)
[name] EMERGENCY DEPARTMENT Provider Note   CSN: [id number] Arrival date & time: 10/08/17  1526    History   Chief Complaint Chief Complaint  Patient presents with  . Shoulder Injury    HPI [name] is a 47 y.o. male.  Pt fell on Wednesday when his right leg gave out. Landed against doorframe hitting left shoulder. Was seen at [facility] and underwent L shoulder XR which was negative. Continues to have burning and tingling in 4th and 5th digit on the left hand. No further injury. Now complaining of midline back pain as well. No neck pain.   Shoulder Injury  This is a new problem. The current episode started more than 2 days ago. The problem occurs constantly. The problem has been gradually worsening. Pertinent negatives include no chest pain, no abdominal pain, no headaches and no shortness of breath. Exacerbated by: moving L shoulder. Nothing relieves the symptoms. Treatments tried: Muscle relaxor, lyrica, mobic, tramadol.    Past Medical History:  Diagnosis Date  . Lyme disease   . [facility] spotted fever     There are no active problems to display for this patient.   Past Surgical History:  Procedure Laterality Date  . BACK SURGERY    . HIP SURGERY    . TONSILLECTOMY     47 years old        Home Medications    Prior to Admission medications   Medication Sig Start Date End Date Taking? Authorizing Provider  chlorpheniramine (CHLOR-TRIMETON) 4 MG tablet Take 4 mg by mouth as needed for allergies.   Yes [provider]  diclofenac sodium (VOLTAREN) 1 % GEL Apply 2 g topically 4 (four) times daily.   Yes [provider]  meloxicam (MOBIC) 7.5 MG tablet Take 7.5 mg by mouth daily. 10/04/17  Yes [provider]  methocarbamol (ROBAXIN) 500 MG tablet Take 1 tablet (500 mg total) by mouth every 8 (eight) hours as needed for muscle spasms. 10/17/16  Yes Benjiman Core, MD  Multiple Vitamin (MULTIVITAMIN)  tablet Take 1 tablet by mouth daily.   Yes [provider]  traMADol (ULTRAM) 50 MG tablet Take 1 tablet (50 mg total) by mouth every 6 (six) hours as needed. 12/03/13  Yes Phelps, Erin O, PA-C  albuterol (PROVENTIL HFA;VENTOLIN HFA) 108 (90 Base) MCG/ACT inhaler Inhale 2 puffs into the lungs every 4 (four) hours as needed for wheezing or shortness of breath. Patient not taking: Reported on 09/18/2017 11/11/15   Gilda Crease, MD  LYRICA 75 MG capsule Take 2 capsules (150 mg total) by mouth 2 (two) times daily for 14 days. 10/08/17 10/22/17  Lennette Bihari, MD  methylPREDNISolone (MEDROL DOSEPAK) 4 MG TBPK tablet Use as directed 10/08/17   Lennette Bihari, MD  oxyCODONE-acetaminophen (PERCOCET/ROXICET) 5-325 MG tablet Take 1-2 tablets by mouth every 6 (six) hours as needed for severe pain. Patient not taking: Reported on 01/10/2017 10/17/16   Benjiman Core, MD    Family History No family history on file.  Social History Social History   Tobacco Use  . Smoking status: Current Every Day Smoker    Packs/day: 0.50    Types: Cigarettes  . Smokeless tobacco: Never Used  Substance Use Topics  . Alcohol use: Yes    Comment: socially  . Drug use: No     Allergies   Sulfa antibiotics   Review of Systems Review of Systems  Constitutional: Negative for chills and fever.  HENT: Negative for ear pain and sore throat.   Eyes: Negative for pain and visual disturbance.  Respiratory: Negative for cough and shortness of breath.   Cardiovascular: Negative for chest pain and palpitations.  Gastrointestinal: Negative for abdominal pain and vomiting.  Genitourinary: Negative for dysuria and hematuria.  Musculoskeletal: Positive for arthralgias, back pain and myalgias. Negative for neck pain.  Skin: Negative for color change and rash.  Neurological: Negative for dizziness, seizures, syncope, numbness and headaches.  All other systems reviewed and are  negative.    Physical Exam Updated Vital Signs BP (!) 144/97   Pulse 98   Temp 98.6 F (37 C)   Resp 18   Ht  (1.854 m)   Wt 106.1 kg (234 lb)   SpO2 99%   BMI 30.87 kg/m   Physical Exam  Constitutional: He is oriented to person, place, and time. He appears well-developed and well-nourished.  HENT:  Head: Normocephalic and atraumatic.  Eyes: Conjunctivae are normal.  Neck: Neck supple.  Cardiovascular: Normal rate and regular rhythm.  No murmur heard. Pulmonary/Chest: Effort normal and breath sounds normal. No respiratory distress.  Abdominal: Soft. There is no tenderness.  Musculoskeletal: Normal range of motion. He exhibits tenderness. He exhibits no edema or deformity.  No midline cervical spine tenderness. There is mild midline tenderness around T3/T4 midline. There is diffuse pain about the left shoulder, shoulder blade. ROM in the L shoulder, elbow, wrist, and hand are intact. No deficits in the median, ulnar, and radial nerve distributions. Intact distal pulses  Neurological: He is alert and oriented to person, place, and time.  Strength and sensation intact in all extremities. He describes tingling in the 4th and 5th digit of left hand.  Skin: Skin is warm and dry.  Psychiatric: He has a normal mood and affect.  Nursing note and vitals reviewed.    ED Treatments / Results  Labs (all labs ordered are listed, but only abnormal results are displayed) Labs Reviewed - No data to display  EKG None  Radiology Dg Thoracic Spine 2 View  Result Date: 10/08/2017 CLINICAL DATA:  Lt arm and shoulder pain since Wednesday due to a fall onto his lt shoulder. Pt has been seen at [facility] on Wednesday. Numbness lt shoulder and arm. Minimal movement with numbness. EXAM: THORACIC SPINE 2 VIEWS COMPARISON:  09/18/2017 FINDINGS: There is no acute fracture or subluxation. There is disc height loss and mild degenerative change at T11-12. No suspicious lytic or blastic  lesions are identified. IMPRESSION: No evidence for acute  abnormality. Electronically Signed   By: Norva Pavlov M.D.   On: 10/08/2017 17:19    Procedures Procedures (including critical care time)  Medications Ordered in ED Medications  lidocaine (LIDODERM) 5 % 1 patch (1 patch Transdermal Patch Applied 10/08/17 1827)  HYDROmorphone (DILAUDID) injection 1 mg (1 mg Intramuscular Given 10/08/17 1837)     Initial Impression / Assessment and Plan / ED Course  I have reviewed the triage vital signs and the nursing notes.  Pertinent labs & imaging results that were available during my care of the patient were reviewed by me and considered in my medical decision making (see chart for details).     Patient is a 47 year old male with history as above who presents with left shoulder pain.  He had a fall on Wednesday where he landed directly on his left shoulder.  He had a negative x-ray performed at [facility] at that time.  He has  not had any further injury, however he has poor pain control.  Of note, the patient does have a history of chronic pain.  Here, he is neurovascularly intact.  He is having what sounds like neuropathic pain that radiates in an ulnar nerve distribution.  He was given IM Dilaudid here and a lidocaine patch.  We will increase his dose of Lyrica and prescribe a Medrol Dosepak.  He already has follow-up scheduled with orthopedics but has not yet seen them.  He had no tenderness in his cervical spine although he did have tenderness over his thoracic spine.  X-ray of his thoracic spine showed no acute fracture.  He appears to be stable for discharge at this time.  Follow-up instructions discussed.  He is in agreement with this plan.  Return precautions discussed in detail.  Final Clinical Impressions(s) / ED Diagnoses   Final diagnoses:  Acute pain of left shoulder    ED Discharge Orders        Ordered    LYRICA 75 MG capsule  2 times daily     10/08/17 1841     methylPREDNISolone (MEDROL DOSEPAK) 4 MG TBPK tablet     10/08/17 1841       Lennette Bihari, MD 10/08/17 1846    Charlynne Pander, MD 10/08/17 2306

## 2017-10-08 NOTE — ED Triage Notes (Signed)
Lt arm and shoulder pain since Wednesday.  He fell onto his lt shoulder.  Gaylyn Rong has been seen at [facility] on Wednesday.  Numbness lt shoulder and arm  Minimal movement with numbness  And difficulty moving his fingers since 1100am today  He has a burning pain between his shoulders. Lt arm and h hand sl discolored good radial pulse his bone doctor thinks he may have  A pinched nerve

## 2017-10-15 ENCOUNTER — Emergency Department (HOSPITAL_COMMUNITY)
Admission: EM | Admit: 2017-10-15 | Discharge: 2017-10-15 | Disposition: A | Discharge status: Home / Self Care | Payer: Self-pay | Attending: Emergency Medicine | Admitting: Emergency Medicine

## 2017-10-15 ENCOUNTER — Encounter (HOSPITAL_COMMUNITY): Payer: Self-pay | Admitting: Emergency Medicine

## 2017-10-15 ENCOUNTER — Other Ambulatory Visit: Payer: Self-pay

## 2017-10-15 DIAGNOSIS — M549 Dorsalgia, unspecified: Secondary | ICD-10-CM | POA: Insufficient documentation

## 2017-10-15 DIAGNOSIS — M25512 Pain in left shoulder: Secondary | ICD-10-CM | POA: Insufficient documentation

## 2017-10-15 DIAGNOSIS — R6883 Chills (without fever): Secondary | ICD-10-CM | POA: Insufficient documentation

## 2017-10-15 DIAGNOSIS — Z79899 Other long term (current) drug therapy: Secondary | ICD-10-CM | POA: Insufficient documentation

## 2017-10-15 DIAGNOSIS — F17210 Nicotine dependence, cigarettes, uncomplicated: Secondary | ICD-10-CM | POA: Insufficient documentation

## 2017-10-15 DIAGNOSIS — R1110 Vomiting, unspecified: Secondary | ICD-10-CM | POA: Insufficient documentation

## 2017-10-15 MED ORDER — OXYCODONE-ACETAMINOPHEN 5-325 MG PO TABS
2.0000 | ORAL_TABLET | Freq: Once | ORAL | Status: AC
  Administered 2017-10-15: 2 via ORAL
  Filled 2017-10-15: qty 2

## 2017-10-15 NOTE — ED Triage Notes (Signed)
Pt. Stated, Im having shoulder pain due to have surgery on Tuesday. Ned something for pain.

## 2017-10-15 NOTE — ED Provider Notes (Signed)
[name] EMERGENCY DEPARTMENT Provider Note   CSN: [id number] Arrival date & time: 10/15/17  0913     History   Chief Complaint Chief Complaint  Patient presents with  . Shoulder Pain    HPI [name] is a 47 y.o. male who presents with left shoulder pain.  He states that his right hip gave out several weeks ago which caused his left shoulder to run into a door.  He initially thought his shoulder was dislocated however after he had x-rays done he was told it was not.  He continued to have pain of the left shoulder.  He also has pain over the left thoracic back with pain shooting down his left arm.  This is his third visit for left shoulder pain.  He states he has an appointment with Dr. Shon Baton with orthopedics in 2 days however the pain was so severe last night that he could not sleep and he has been having chills and vomiting due to the pain.  He takes tramadol, Lyrica, Robaxin for pain.  He is requesting pain control today.  HPI  Past Medical History:  Diagnosis Date  . Lyme disease   . [facility] spotted fever     There are no active problems to display for this patient.   Past Surgical History:  Procedure Laterality Date  . BACK SURGERY    . HIP SURGERY    . TONSILLECTOMY     47 years old        Home Medications    Prior to Admission medications   Medication Sig Start Date End Date Taking? Authorizing Provider  albuterol (PROVENTIL HFA;VENTOLIN HFA) 108 (90 Base) MCG/ACT inhaler Inhale 2 puffs into the lungs every 4 (four) hours as needed for wheezing or shortness of breath. Patient not taking: Reported on 09/18/2017 11/11/15   Gilda Crease, MD  chlorpheniramine (CHLOR-TRIMETON) 4 MG tablet Take 4 mg by mouth as needed for allergies.    [provider]  diclofenac sodium (VOLTAREN) 1 % GEL Apply 2 g topically 4 (four) times daily.    [provider]  LYRICA 75 MG capsule Take 2 capsules (150 mg total) by  mouth 2 (two) times daily for 14 days. 10/08/17 10/22/17  Lennette Bihari, MD  meloxicam (MOBIC) 7.5 MG tablet Take 7.5 mg by mouth daily. 10/04/17   [provider]  methocarbamol (ROBAXIN) 500 MG tablet Take 1 tablet (500 mg total) by mouth every 8 (eight) hours as needed for muscle spasms. 10/17/16   Benjiman Core, MD  methylPREDNISolone (MEDROL DOSEPAK) 4 MG TBPK tablet Use as directed 10/08/17   Lennette Bihari, MD  Multiple Vitamin (MULTIVITAMIN) tablet Take 1 tablet by mouth daily.    [provider]  oxyCODONE-acetaminophen (PERCOCET/ROXICET) 5-325 MG tablet Take 1-2 tablets by mouth every 6 (six) hours as needed for severe pain. Patient not taking: Reported on 01/10/2017 10/17/16   Benjiman Core, MD  traMADol (ULTRAM) 50 MG tablet Take 1 tablet (50 mg total) by mouth every 6 (six) hours as needed. 12/03/13   Lurene Shadow, PA-C    Family History No family history on file.  Social History Social History   Tobacco Use  . Smoking status: Current Every Day Smoker    Packs/day: 0.50    Types: Cigarettes  . Smokeless tobacco: Never Used  Substance Use Topics  . Alcohol use: Yes    Comment: socially  . Drug use: No  Allergies   Sulfa antibiotics   Review of Systems Review of Systems  Musculoskeletal: Positive for arthralgias.  Neurological: Positive for numbness. Negative for weakness.     Physical Exam Updated Vital Signs BP (!) 135/97 (BP Location: Right Arm)   Pulse 80   Temp 97.6 F (36.4 C) (Oral)   Resp 18   Ht  (1.854 m)   Wt 106.1 kg (234 lb)   SpO2 99%   BMI 30.87 kg/m   Physical Exam  Constitutional: He is oriented to person, place, and time. He appears well-developed and well-nourished. He appears distressed (Shaking and pain).  HENT:  Head: Normocephalic and atraumatic.  Eyes: Pupils are equal, round, and reactive to light. Conjunctivae are normal. Right eye exhibits no discharge. Left eye exhibits no discharge.  No scleral icterus.  Neck: Normal range of motion.  Cardiovascular: Normal rate.  Pulmonary/Chest: Effort normal. No respiratory distress.  Abdominal: He exhibits no distension.  Musculoskeletal:  Left arm is in a sling.  He has significant tenderness over the left thoracic back and anterior shoulder over the biceps tendon.  He has good grip strength and 2+ radial pulse  Neurological: He is alert and oriented to person, place, and time.  Skin: Skin is warm and dry.  Psychiatric: He has a normal mood and affect. His behavior is normal.  Nursing note and vitals reviewed.    ED Treatments / Results  Labs (all labs ordered are listed, but only abnormal results are displayed) Labs Reviewed - No data to display  EKG None  Radiology No results found.  Procedures Procedures (including critical care time)  Medications Ordered in ED Medications  oxyCODONE-acetaminophen (PERCOCET/ROXICET) 5-325 MG per tablet 2 tablet (has no administration in time range)     Initial Impression / Assessment and Plan / ED Course  I have reviewed the triage vital signs and the nursing notes.  Pertinent labs & imaging results that were available during my care of the patient were reviewed by me and considered in my medical decision making (see chart for details).  47 year old male presents with uncontrolled left shoulder pain.  He states he has an appointment with orthopedics in 2 days.  He is significantly tender on exam.  I advised him that we could provide pain control for him in the emergency department but we will not refill any narcotics for him.  He verbalized understanding.  He was also advised that his orthopedic doctor should be managing his pain not the emergency department.  Final Clinical Impressions(s) / ED Diagnoses   Final diagnoses:  Acute pain of left shoulder    ED Discharge Orders    None       Bethel Born, PA-C 10/15/17 1001    Loren Racer, MD 10/15/17  1056

## 2017-11-08 NOTE — Pre-Procedure Instructions (Signed)
[name]  11/08/2017      [facility] [address], [place] - [id number] [place] [id number] Phone: [phone] Fax: [phone]    Your procedure is scheduled on Wednesday June 19.  [facility] Admitting at 10:00 A.M.  Call this number if you have problems the morning of surgery:  [phone]   Remember:  Do not eat or drink after midnight.    Take these medicines the morning of surgery with A SIP OF WATER:   Lyrica Methocarbamol (Robaxin) if needed Acetaminophen (tylenol) if needed Tramadol (utlram) if needed  7 days prior to surgery STOP taking any Aspirin(unless otherwise instructed by your surgeon), Aleve, Naproxen, Ibuprofen, Motrin, Advil, Goody's, BC's, all herbal medications, fish oil, and all vitamins     Do not wear jewelry, make-up or nail polish.  Do not wear lotions, powders, or perfumes, or deodorant.  Do not shave 48 hours prior to surgery.  Men may shave face and neck.  Do not bring valuables to the hospital.  [facility] is not responsible for any belongings or valuables.  Contacts, dentures or bridgework may not be worn into surgery.  Leave your suitcase in the car.  After surgery it may be brought to your room.  For patients admitted to the hospital, discharge time will be determined by your treatment team.  Patients discharged the day of surgery will not be allowed to drive home.   Special instructions:    [place]- Preparing For Surgery  Before surgery, you can play an important role. Because skin is not sterile, your skin needs to be as free of germs as possible. You can reduce the number of germs on your skin by washing with CHG (chlorahexidine gluconate) Soap before surgery.  CHG is an antiseptic cleaner which kills germs and bonds with the skin to continue killing germs even after washing.    Oral Hygiene is also important to reduce your risk of infection.   Remember - BRUSH YOUR TEETH THE MORNING OF SURGERY WITH YOUR REGULAR TOOTHPASTE  Please do not use if you have an allergy to CHG or antibacterial soaps. If your skin becomes reddened/irritated stop using the CHG.  Do not shave (including legs and underarms) for at least 48 hours prior to first CHG shower. It is OK to shave your face.  Please follow these instructions carefully.   1. Shower the NIGHT BEFORE SURGERY and the MORNING OF SURGERY with CHG.   2. If you chose to wash your hair, wash your hair first as usual with your normal shampoo.  3. After you shampoo, rinse your hair and body thoroughly to remove the shampoo.  4. Use CHG as you would any other liquid soap. You can apply CHG directly to the skin and wash gently with a scrungie or a clean washcloth.   5. Apply the CHG Soap to your body ONLY FROM THE NECK DOWN.  Do not use on open wounds or open sores. Avoid contact with your eyes, ears, mouth and genitals (private parts). Wash Face and genitals (private parts)  with your normal soap.  6. Wash thoroughly, paying special attention to the area where your surgery will be performed.  7. Thoroughly rinse your body with warm water from the neck down.  8. DO NOT shower/wash with your normal soap after using and rinsing off the CHG Soap.  9. Pat yourself dry with a CLEAN TOWEL.  10. Wear CLEAN PAJAMAS to bed the night before surgery, wear comfortable clothes the morning of surgery  11. Place CLEAN SHEETS on your bed the night of your first shower and DO NOT SLEEP WITH PETS.    Day of Surgery:  Do not apply any deodorants/lotions.  Please wear clean clothes to the hospital/surgery center.   Remember to brush your teeth WITH YOUR REGULAR TOOTHPASTE.    Please read over the following fact sheets that you were given. Coughing and Deep Breathing, MRSA Information and Surgical Site Infection Prevention

## 2017-11-09 ENCOUNTER — Encounter (HOSPITAL_COMMUNITY)
Admission: RE | Admit: 2017-11-09 | Discharge: 2017-11-09 | Disposition: A | Discharge status: Home / Self Care | Payer: No Typology Code available for payment source | Source: Ambulatory Visit | Attending: Orthopedic Surgery | Admitting: Orthopedic Surgery

## 2017-11-09 ENCOUNTER — Encounter (HOSPITAL_COMMUNITY): Payer: Self-pay

## 2017-11-09 DIAGNOSIS — Z01812 Encounter for preprocedural laboratory examination: Secondary | ICD-10-CM | POA: Insufficient documentation

## 2017-11-09 HISTORY — DX: Headache: R51

## 2017-11-09 HISTORY — DX: Headache, unspecified: R51.9

## 2017-11-09 HISTORY — DX: Anxiety disorder, unspecified: F41.9

## 2017-11-09 LAB — SURGICAL PCR SCREEN
MRSA, PCR: NEGATIVE
Staphylococcus aureus: NEGATIVE

## 2017-11-09 LAB — BASIC METABOLIC PANEL
Anion gap: 11 (ref 5–15)
BUN: 8 mg/dL (ref 6–20)
CHLORIDE: 104 mmol/L (ref 101–111)
CO2: 25 mmol/L (ref 22–32)
CREATININE: 1.14 mg/dL (ref 0.61–1.24)
Calcium: 9.5 mg/dL (ref 8.9–10.3)
GFR calc Af Amer: >60 mL/min (ref >60)
Glucose, Bld: 110 mg/dL — ABNORMAL HIGH (ref 65–99)
Potassium: 4.5 mmol/L (ref 3.5–5.1)
SODIUM: 140 mmol/L (ref 135–145)

## 2017-11-09 LAB — CBC
HCT: 47.9 % (ref 39.0–52.0)
Hemoglobin: 16.1 g/dL (ref 13.0–17.0)
MCH: 30.2 pg (ref 26.0–34.0)
MCHC: 33.6 g/dL (ref 30.0–36.0)
MCV: 89.9 fL (ref 78.0–100.0)
Platelets: 148 10*3/uL — ABNORMAL LOW (ref 150–400)
RBC: 5.33 MIL/uL (ref 4.22–5.81)
RDW: 12.3 % (ref 11.5–15.5)
WBC: 10.8 10*3/uL — AB (ref 4.0–10.5)

## 2017-11-13 NOTE — H&P (Addendum)
Patient ID: [name] MRN: [id number] DOB/AGE: 1970-07-17 47 y.o.  Admit date: (Not on file)  Admission Diagnoses:  Right SI joint dysfunction  HPI: Very pleasant 47 year old male patient presents to the clinic for his H&P prior to his right SI fusion.  Pt smokes 5-6 cigarettes a day.  He continues to work on complete cessation.  Past Medical History: Past Medical History:  Diagnosis Date  . Anxiety   . Headache   . Lyme disease   . 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     Surgical History: Past Surgical History:  Procedure Laterality Date  . BACK SURGERY    . HIP SURGERY    . TONSILLECTOMY     47 years old    Family History: No family history on file.  Social History: Social History   Socioeconomic History  . Marital status: Married    Spouse name: Not on file  . Number of children: Not on file  . Years of education: Not on file  . Highest education level: Not on file  Occupational History  . Not on file  Social Needs  . Financial resource strain: Not on file  . Food insecurity:    Worry: Not on file    Inability: Not on file  . Transportation needs:    Medical: Not on file    Non-medical: Not on file  Tobacco Use  . Smoking status: Current Every Day Smoker    Packs/day: 0.50    Years: 30.00    Pack years: 15.00    Types: Cigarettes  . Smokeless tobacco: Never Used  Substance and Sexual Activity  . Alcohol use: Yes    Comment: stopped 2018  . Drug use: No  . Sexual activity: Never  Lifestyle  . Physical activity:    Days per week: Not on file    Minutes per session: Not on file  . Stress: Not on file  Relationships  . Social connections:    Talks on phone: Not on file    Gets together: Not on file    Attends religious service: Not on file    Active member of club or organization: Not on file    Attends meetings of clubs or organizations: Not on file    Relationship status: Not on file  . Intimate partner violence:    Fear of  current or ex partner: Not on file    Emotionally abused: Not on file    Physically abused: Not on file    Forced sexual activity: Not on file  Other Topics Concern  . Not on file  Social History Narrative  . Not on file    Allergies: Sulfa antibiotics  Medications: I have reviewed the patient's current medications.  Vital Signs: No data found.  Radiology: No results found.  Labs: No results for input(s): WBC, RBC, HCT, PLT in the last 72 hours. No results for input(s): NA, K, CL, CO2, BUN, CREATININE, GLUCOSE, CALCIUM in the last 72 hours. No results for input(s): LABPT, INR in the last 72 hours.  Review of Systems: ROS  Physical Exam: There is no height or weight on file to calculate BMI.  Physical Exam  Constitutional: He is oriented to person, place, and time. He appears well-developed and well-nourished.  HENT:  Head: Normocephalic.  Cardiovascular: Normal rate and regular rhythm.  Respiratory: Effort normal and breath sounds normal.  GI: Soft. Bowel sounds are normal.  Neurological: He is alert and oriented to person, place,  and time.  Skin: Skin is warm and dry.  Psychiatric: He has a normal mood and affect. His behavior is normal. Judgment and thought content normal.   Lumbar spine: Significant right SI joint pain.  Positive Faber test, positive Gleason's test.  Reproduction of pain with direct palpation over the SI joint.  No significant midline lumbar pain with direct palpation. Neuro: Intact with no focal deficits in the lower extremity.  He does get occasional sciatic-like pain and dysesthesias into the right lower extremity.  Assessment and Plan:  Risks and benefits of surgery were discussed with the patient. These include: Infection, bleeding, death, stroke, paralysis, ongoing or worse pain, need for additional surgery, nonunion, leak of spinal fluid, adjacent segment degeneration requiring additional fusion surgery, Loss of bowel and bladder control.  Postoperative hematoma causing neurologic compression that could require urgent or emergent re-operation.  Goals of surgery: Reduced (not eliminated) pain, and improved quality of life. [name], PAC for Garrett Lickahari [name], MD [facility] [phone]  Patient continues to have severe right SI joint pain.  Clinical exam consistent with sacroiliac dysfunction.  Patient also has left shoulder pain secondary to a fall brought on by pain from his SI joint.  At this point he has been in the sling and he has pain with palpation.  Imaging studies of the shoulder demonstrated that there is no acute fracture.  At this point time we are moving forward with the SI fusion primarily because of the severity of his pain.  Patient will require using a platform walker postoperatively since he will be nonweightbearing on the right lower extremity and because of his underlying shoulder pain he is on able to use crutches.  Patient will require admission overnight primarily for therapy to aid in his use of the platform walker.  All this was explained to the patient.  I have also reviewed the risks and benefits of surgery.

## 2017-11-15 ENCOUNTER — Observation Stay (HOSPITAL_COMMUNITY)
Admission: RE | Admit: 2017-11-15 | Discharge: 2017-11-15 | Disposition: A | Discharge status: Home / Self Care | Payer: No Typology Code available for payment source | Source: Ambulatory Visit | Attending: Orthopedic Surgery | Admitting: Orthopedic Surgery

## 2017-11-15 ENCOUNTER — Other Ambulatory Visit: Payer: Self-pay

## 2017-11-15 ENCOUNTER — Ambulatory Visit (HOSPITAL_COMMUNITY): Payer: No Typology Code available for payment source | Admitting: Anesthesiology

## 2017-11-15 ENCOUNTER — Encounter (HOSPITAL_COMMUNITY): Payer: Self-pay | Admitting: Anesthesiology

## 2017-11-15 ENCOUNTER — Encounter (HOSPITAL_COMMUNITY)
Admission: RE | Disposition: A | Discharge status: Home / Self Care | Payer: Self-pay | Source: Ambulatory Visit | Attending: Orthopedic Surgery

## 2017-11-15 ENCOUNTER — Ambulatory Visit (HOSPITAL_COMMUNITY): Payer: No Typology Code available for payment source

## 2017-11-15 DIAGNOSIS — Z79899 Other long term (current) drug therapy: Secondary | ICD-10-CM | POA: Diagnosis not present

## 2017-11-15 DIAGNOSIS — Z791 Long term (current) use of non-steroidal anti-inflammatories (NSAID): Secondary | ICD-10-CM | POA: Diagnosis not present

## 2017-11-15 DIAGNOSIS — M533 Sacrococcygeal disorders, not elsewhere classified: Secondary | ICD-10-CM | POA: Diagnosis present

## 2017-11-15 DIAGNOSIS — F17210 Nicotine dependence, cigarettes, uncomplicated: Secondary | ICD-10-CM | POA: Diagnosis not present

## 2017-11-15 DIAGNOSIS — Z419 Encounter for procedure for purposes other than remedying health state, unspecified: Secondary | ICD-10-CM

## 2017-11-15 DIAGNOSIS — Z79891 Long term (current) use of opiate analgesic: Secondary | ICD-10-CM | POA: Insufficient documentation

## 2017-11-15 DIAGNOSIS — Z981 Arthrodesis status: Secondary | ICD-10-CM

## 2017-11-15 HISTORY — PX: SACROILIAC JOINT FUSION: SHX6088

## 2017-11-15 SURGERY — SACROILIAC JOINT FUSION
Anesthesia: General | Laterality: Right

## 2017-11-15 MED ORDER — LACTATED RINGERS IV SOLN
INTRAVENOUS | Status: DC
  Administered 2017-11-15: 09:00:00 via INTRAVENOUS

## 2017-11-15 MED ORDER — MIDAZOLAM HCL 2 MG/2ML IJ SOLN
INTRAMUSCULAR | Status: AC
  Filled 2017-11-15: qty 2

## 2017-11-15 MED ORDER — ONDANSETRON HCL 4 MG PO TABS: 4.0000 mg | ORAL_TABLET | Freq: Four times a day (QID) | ORAL | Status: DC | PRN

## 2017-11-15 MED ORDER — METHOCARBAMOL 1000 MG/10ML IJ SOLN
500.0000 mg | Freq: Four times a day (QID) | INTRAVENOUS | Status: DC | PRN
  Filled 2017-11-15: qty 5

## 2017-11-15 MED ORDER — 0.9 % SODIUM CHLORIDE (POUR BTL) OPTIME
TOPICAL | Status: DC | PRN
  Administered 2017-11-15: 1000 mL

## 2017-11-15 MED ORDER — ONDANSETRON HCL 4 MG/2ML IJ SOLN: 4.0000 mg | Freq: Four times a day (QID) | INTRAMUSCULAR | Status: DC | PRN

## 2017-11-15 MED ORDER — FENTANYL CITRATE (PF) 100 MCG/2ML IJ SOLN
INTRAMUSCULAR | Status: DC
Start: 2017-11-15 — End: 2017-11-15
  Filled 2017-11-15: qty 2

## 2017-11-15 MED ORDER — LIDOCAINE 2% (20 MG/ML) 5 ML SYRINGE
INTRAMUSCULAR | Status: DC | PRN
  Administered 2017-11-15: 100 mg via INTRAVENOUS

## 2017-11-15 MED ORDER — ACETAMINOPHEN 10 MG/ML IV SOLN
1000.0000 mg | Freq: Once | INTRAVENOUS | Status: AC
Start: 2017-11-15 — End: 2017-11-15
  Administered 2017-11-15: 1000 mg via INTRAVENOUS
  Filled 2017-11-15: qty 100

## 2017-11-15 MED ORDER — CEFAZOLIN SODIUM-DEXTROSE 2-4 GM/100ML-% IV SOLN
2.0000 g | INTRAVENOUS | Status: AC
  Administered 2017-11-15: 2 g via INTRAVENOUS

## 2017-11-15 MED ORDER — BUPIVACAINE-EPINEPHRINE 0.25% -1:200000 IJ SOLN
INTRAMUSCULAR | Status: DC | PRN
  Administered 2017-11-15: 30 mL

## 2017-11-15 MED ORDER — ACETAMINOPHEN 325 MG PO TABS: 650.0000 mg | ORAL_TABLET | ORAL | Status: DC | PRN

## 2017-11-15 MED ORDER — ONDANSETRON 4 MG PO TBDP: 4.0000 mg | ORAL_TABLET | Freq: Three times a day (TID) | ORAL | 0 refills | Status: DC | PRN

## 2017-11-15 MED ORDER — MENTHOL 3 MG MT LOZG: 1.0000 | LOZENGE | OROMUCOSAL | Status: DC | PRN

## 2017-11-15 MED ORDER — METHOCARBAMOL 500 MG PO TABS: 500.0000 mg | ORAL_TABLET | Freq: Three times a day (TID) | ORAL | 0 refills | Status: DC

## 2017-11-15 MED ORDER — HYDROCODONE-ACETAMINOPHEN 5-325 MG PO TABS: 1.0000 | ORAL_TABLET | ORAL | Status: DC | PRN

## 2017-11-15 MED ORDER — PREGABALIN 75 MG PO CAPS: 75.0000 mg | ORAL_CAPSULE | Freq: Three times a day (TID) | ORAL | Status: DC

## 2017-11-15 MED ORDER — SUGAMMADEX SODIUM 200 MG/2ML IV SOLN
INTRAVENOUS | Status: DC | PRN
  Administered 2017-11-15: 200 mg via INTRAVENOUS

## 2017-11-15 MED ORDER — DEXAMETHASONE SODIUM PHOSPHATE 4 MG/ML IJ SOLN
INTRAMUSCULAR | Status: DC | PRN
  Administered 2017-11-15: 10 mg via INTRAVENOUS

## 2017-11-15 MED ORDER — HYDROCODONE-ACETAMINOPHEN 5-325 MG PO TABS: 1.0000 | ORAL_TABLET | ORAL | 0 refills | Status: AC | PRN

## 2017-11-15 MED ORDER — FENTANYL CITRATE (PF) 250 MCG/5ML IJ SOLN
INTRAMUSCULAR | Status: AC
  Filled 2017-11-15: qty 5

## 2017-11-15 MED ORDER — METOCLOPRAMIDE HCL 5 MG/ML IJ SOLN: 10.0000 mg | Freq: Once | INTRAMUSCULAR | Status: DC | PRN

## 2017-11-15 MED ORDER — METHOCARBAMOL 500 MG PO TABS: 500.0000 mg | ORAL_TABLET | Freq: Four times a day (QID) | ORAL | Status: DC | PRN

## 2017-11-15 MED ORDER — MEPERIDINE HCL 50 MG/ML IJ SOLN: 6.2500 mg | INTRAMUSCULAR | Status: DC | PRN

## 2017-11-15 MED ORDER — BUPIVACAINE-EPINEPHRINE (PF) 0.25% -1:200000 IJ SOLN
INTRAMUSCULAR | Status: AC
  Filled 2017-11-15: qty 30

## 2017-11-15 MED ORDER — METHOCARBAMOL 500 MG PO TABS: 500.0000 mg | ORAL_TABLET | Freq: Three times a day (TID) | ORAL | Status: DC | PRN

## 2017-11-15 MED ORDER — PROPOFOL 10 MG/ML IV BOLUS
INTRAVENOUS | Status: AC
  Filled 2017-11-15: qty 20

## 2017-11-15 MED ORDER — MAGNESIUM CITRATE PO SOLN: 1.0000 | Freq: Once | ORAL | Status: DC | PRN

## 2017-11-15 MED ORDER — PHENOL 1.4 % MT LIQD: 1.0000 | OROMUCOSAL | Status: DC | PRN

## 2017-11-15 MED ORDER — LACTATED RINGERS IV SOLN: INTRAVENOUS | Status: DC

## 2017-11-15 MED ORDER — ACETAMINOPHEN 650 MG RE SUPP: 650.0000 mg | RECTAL | Status: DC | PRN

## 2017-11-15 MED ORDER — MORPHINE SULFATE (PF) 2 MG/ML IV SOLN: 1.0000 mg | INTRAVENOUS | Status: DC | PRN

## 2017-11-15 MED ORDER — FENTANYL CITRATE (PF) 100 MCG/2ML IJ SOLN
25.0000 ug | INTRAMUSCULAR | Status: DC | PRN
  Administered 2017-11-15 (×4): 25 ug via INTRAVENOUS

## 2017-11-15 MED ORDER — SODIUM CHLORIDE 0.9% FLUSH: 3.0000 mL | INTRAVENOUS | Status: DC | PRN

## 2017-11-15 MED ORDER — CEFAZOLIN SODIUM-DEXTROSE 2-4 GM/100ML-% IV SOLN: 2.0000 g | Freq: Three times a day (TID) | INTRAVENOUS | Status: DC

## 2017-11-15 MED ORDER — MIDAZOLAM HCL 5 MG/5ML IJ SOLN
INTRAMUSCULAR | Status: DC | PRN
  Administered 2017-11-15: 2 mg via INTRAVENOUS

## 2017-11-15 MED ORDER — PROPOFOL 10 MG/ML IV BOLUS
INTRAVENOUS | Status: DC | PRN
  Administered 2017-11-15: 200 mg via INTRAVENOUS
  Administered 2017-11-15: 20 mg via INTRAVENOUS

## 2017-11-15 MED ORDER — ONDANSETRON HCL 4 MG/2ML IJ SOLN
INTRAMUSCULAR | Status: DC | PRN
  Administered 2017-11-15: 4 mg via INTRAVENOUS

## 2017-11-15 MED ORDER — FENTANYL CITRATE (PF) 100 MCG/2ML IJ SOLN
INTRAMUSCULAR | Status: DC | PRN
  Administered 2017-11-15 (×2): 100 ug via INTRAVENOUS
  Administered 2017-11-15: 50 ug via INTRAVENOUS

## 2017-11-15 MED ORDER — HYDROCODONE-ACETAMINOPHEN 10-325 MG PO TABS: 2.0000 | ORAL_TABLET | ORAL | Status: DC | PRN

## 2017-11-15 MED ORDER — SODIUM CHLORIDE 0.9 % IV SOLN: 250.0000 mL | INTRAVENOUS | Status: DC

## 2017-11-15 MED ORDER — BUPIVACAINE LIPOSOME 1.3 % IJ SUSP
20.0000 mL | Freq: Once | INTRAMUSCULAR | Status: AC
  Administered 2017-11-15: 20 mL
  Filled 2017-11-15: qty 20

## 2017-11-15 MED ORDER — POLYETHYLENE GLYCOL 3350 17 G PO PACK: 17.0000 g | PACK | Freq: Every day | ORAL | Status: DC | PRN

## 2017-11-15 MED ORDER — SODIUM CHLORIDE 0.9% FLUSH: 3.0000 mL | Freq: Two times a day (BID) | INTRAVENOUS | Status: DC

## 2017-11-15 MED ORDER — ROCURONIUM BROMIDE 100 MG/10ML IV SOLN
INTRAVENOUS | Status: DC | PRN
  Administered 2017-11-15: 50 mg via INTRAVENOUS

## 2017-11-15 MED ORDER — DOCUSATE SODIUM 100 MG PO CAPS: 100.0000 mg | ORAL_CAPSULE | Freq: Two times a day (BID) | ORAL | Status: DC

## 2017-11-15 SURGICAL SUPPLY — 51 items
BLADE CLIPPER SURG (BLADE) IMPLANT
BLADE SURG 11 STRL SS (BLADE) ×3 IMPLANT
CLOSURE STERI-STRIP 1/2X4 (GAUZE/BANDAGES/DRESSINGS) ×1
CLOSURE STERI-STRIP 1/4X4 (GAUZE/BANDAGES/DRESSINGS) ×2 IMPLANT
CLSR STERI-STRIP ANTIMIC 1/2X4 (GAUZE/BANDAGES/DRESSINGS) ×2 IMPLANT
COVER SURGICAL LIGHT HANDLE (MISCELLANEOUS) ×3 IMPLANT
DRAPE C-ARM 42X72 X-RAY (DRAPES) ×3 IMPLANT
DRAPE C-ARMOR (DRAPES) ×3 IMPLANT
DRAPE SURG 17X23 STRL (DRAPES) ×3 IMPLANT
DRAPE U-SHAPE 47X51 STRL (DRAPES) ×3 IMPLANT
DRSG OPSITE POSTOP 3X4 (GAUZE/BANDAGES/DRESSINGS) ×3 IMPLANT
DRSG OPSITE POSTOP 4X6 (GAUZE/BANDAGES/DRESSINGS) ×2 IMPLANT
DURAPREP 26ML APPLICATOR (WOUND CARE) ×3 IMPLANT
ELECT BLADE 4.0 EZ CLEAN MEGAD (MISCELLANEOUS) ×3
ELECT PENCIL ROCKER SW 15FT (MISCELLANEOUS) ×3 IMPLANT
ELECT REM PT RETURN 9FT ADLT (ELECTROSURGICAL) ×3
ELECTRODE BLDE 4.0 EZ CLN MEGD (MISCELLANEOUS) ×1 IMPLANT
ELECTRODE REM PT RTRN 9FT ADLT (ELECTROSURGICAL) ×1 IMPLANT
GLOVE BIOGEL PI IND STRL 8.5 (GLOVE) ×1 IMPLANT
GLOVE BIOGEL PI INDICATOR 8.5 (GLOVE) ×2
GLOVE SS BIOGEL STRL SZ 8.5 (GLOVE) ×1 IMPLANT
GLOVE SUPERSENSE BIOGEL SZ 8.5 (GLOVE) ×2
GOWN STRL REUS W/ TWL LRG LVL3 (GOWN DISPOSABLE) ×1 IMPLANT
GOWN STRL REUS W/TWL 2XL LVL3 (GOWN DISPOSABLE) ×3 IMPLANT
GOWN STRL REUS W/TWL LRG LVL3 (GOWN DISPOSABLE) ×3
IMPL IFUSE 3D 7X35 (Rod) IMPLANT
IMPL IFUSE 7.0X45 (Rod) IMPLANT
IMPLANT IFUSE 3D 7X35 (Rod) ×3 IMPLANT
IMPLANT IFUSE 7.0MMX40MM (Rod) ×3 IMPLANT
IMPLANT IFUSE 7.0X45 (Rod) ×3 IMPLANT
KIT BASIN OR (CUSTOM PROCEDURE TRAY) ×3 IMPLANT
KIT TURNOVER KIT B (KITS) ×3 IMPLANT
NEEDLE 22X1 1/2 (OR ONLY) (NEEDLE) ×3 IMPLANT
NS IRRIG 1000ML POUR BTL (IV SOLUTION) ×3 IMPLANT
PACK LAMINECTOMY ORTHO (CUSTOM PROCEDURE TRAY) ×3 IMPLANT
PACK UNIVERSAL I (CUSTOM PROCEDURE TRAY) ×3 IMPLANT
PAD ARMBOARD 7.5X6 YLW CONV (MISCELLANEOUS) ×6 IMPLANT
POSITIONER HEAD PRONE TRACH (MISCELLANEOUS) ×3 IMPLANT
STAPLER VISISTAT 35W (STAPLE) ×3 IMPLANT
SUT MNCRL AB 3-0 PS2 18 (SUTURE) IMPLANT
SUT VIC AB 1 CT1 18XCR BRD 8 (SUTURE) ×1 IMPLANT
SUT VIC AB 1 CT1 8-18 (SUTURE) ×3
SUT VIC AB 2-0 CT1 18 (SUTURE) ×3 IMPLANT
SYR BULB IRRIGATION 50ML (SYRINGE) ×3 IMPLANT
SYR CONTROL 10ML LL (SYRINGE) ×3 IMPLANT
SYS SPNL FX3ANG 40X7X (Rod) ×1 IMPLANT
SYSTEM SPNL FX3ANG 40X7X (Rod) IMPLANT
TOWEL OR 17X24 6PK STRL BLUE (TOWEL DISPOSABLE) ×3 IMPLANT
TOWEL OR 17X26 10 PK STRL BLUE (TOWEL DISPOSABLE) ×3 IMPLANT
WATER STERILE IRR 1000ML POUR (IV SOLUTION) ×3 IMPLANT
YANKAUER SUCT BULB TIP NO VENT (SUCTIONS) ×3 IMPLANT

## 2017-11-15 NOTE — Discharge Instructions (Signed)
Spinal Fusion, Care After °These instructions give you information about caring for yourself after your procedure. Your doctor may also give you more specific instructions. Call your doctor if you have any problems or questions after your procedure. °Follow these instructions at home: °Medicines °· Take over-the-counter and prescription medicines only as told by your doctor. These include any medicines for pain. °· Do not drive for 24 hours if you received a sedative. °· Do not drive or use heavy machinery while taking prescription pain medicine. °· If you were prescribed an antibiotic medicine, take it as told by your doctor. Do not stop taking the antibiotic even if you start to feel better. °Surgical Cut (Incision) Care °· Follow instructions from your doctor about how to take care of your surgical cut. Make sure you: °? Wash your hands with soap and water before you change your bandage (dressing). If you cannot use soap and water, use hand sanitizer. °? Change your bandage as told by your doctor. °? Leave stitches (sutures), skin glue, or skin tape (adhesive) strips in place. They may need to stay in place for 2 weeks or longer. If tape strips get loose and curl up, you may trim the loose edges. Do not remove tape strips completely unless your doctor says it is okay. °· Keep your surgical cut clean and dry. Do not take baths, swim, or use a hot tub until your doctor says it is okay. °· Check your surgical cut and the area around it every day for: °? Redness. °? Swelling. °? Fluid. °Physical Activity °· Return to your normal activities as told by your doctor. Ask your doctor what activities are safe for you. Rest and protect your back as much as you can. °· Follow instructions from your doctor about how to move. Use good posture to help your spine heal. °· Do not lift anything that is heavier than 8 lb (3.6 kg) or as told by your doctor until he or she says that it is safe. Do not lift anything over your  head. °· Do not twist or bend at the waist until your doctor says it is okay. °· Avoid pushing or pulling motions. °· Do not sit or lie down in the same position for long periods of time. °· Do not start to exercise until your doctor says it is okay. Ask your doctor what kinds of exercise you can do to make your back stronger. °General instructions °· If you were given a brace, use it as told by your doctor. °· Wear compression stockings as told by your doctor. °· Do not use tobacco products. These include cigarettes, chewing tobacco, or e-cigarettes. If you need help quitting, ask your doctor. °· Keep all follow-up visits as told by your doctor. This is important. This includes any visits with your physical therapist, if this applies. °Contact a doctor if: °· Your pain gets worse. °· Your medicine does not help your pain. °· Your legs or feet become painful or swollen. °· Your surgical cut is red, swollen, or painful. °· You have fluid, blood, or pus coming from your surgical cut. °· You feel sick to your stomach (nauseous). °· You throw up (vomit). °· Your have weakness or loss of feeling (numbness) in your legs that is new or getting worse. °· You have a fever. °· You have trouble controlling when you pee (urinate) or poop (have a bowel movement). °Get help right away if: °· Your pain is very bad. °· You have   chest pain. °· You have trouble breathing. °· You start to have a cough. °These symptoms may be an emergency. Do not wait to see if the symptoms will go away. Get medical help right away. Call your local emergency services (911 in the [place] Do not drive yourself to the hospital. °This information is not intended to replace advice given to you by your health care provider. Make sure you discuss any questions you have with your health care provider. °Document Released: 09/09/2010 Document Revised: 01/12/2016 Document Reviewed: 10/29/2014 °Elsevier Interactive Patient Education © 2018 [employer] ° °

## 2017-11-15 NOTE — Evaluation (Signed)
Physical Therapy Evaluation and Discharge Patient Details Name: [name] MRN: [id number] DOB: 12-Jun-1970 Today's Date: 11/15/2017   History of Present Illness  47 yo male s/p right SI joint fusion. PMH including anxiety, lymes disease, rocky mountain spotted fever, back surgery, and hip surgery.   Clinical Impression  Patient evaluated by Physical Therapy with no further acute PT needs identified. All education has been completed and the patient has no further questions. Pt overall requiring supervision for mobility with RW. Demonstrated good technique with use of RW. No LOB noted. Reports wife and other family able to assist as needed. See below for any follow-up Physical Therapy or equipment needs. PT is signing off. Thank you for this referral.     Follow Up Recommendations No PT follow up    Equipment Recommendations  None recommended by PT    Recommendations for Other Services       Precautions / Restrictions Precautions Precautions: Fall Restrictions Weight Bearing Restrictions: Yes RLE Weight Bearing: Non weight bearing Other Position/Activity Restrictions: No order. Assuming conservative protocal with NWBing      Mobility  Bed Mobility               General bed mobility comments: Sitting at EOB upon arrival  Transfers Overall transfer level: Needs assistance Equipment used: Rolling walker (2 wheeled) Transfers: Sit to/from Stand Sit to Stand: Supervision         General transfer comment: supervision for safety. No physical A needed. able to maintain NWB.   Ambulation/Gait Ambulation/Gait assistance: Supervision Gait Distance (Feet): 100 Feet Assistive device: Rolling walker (2 wheeled) Gait Pattern/deviations: Step-to pattern Gait velocity: Decreased    General Gait Details: Good hop through technique using RW. No LOB noted. Able to maintain NWB on RLE.   Stairs            Wheelchair Mobility    Modified Rankin (Stroke Patients  Only)       Balance Overall balance assessment: Needs assistance Sitting-balance support: No upper extremity supported;Feet supported Sitting balance-Leahy Scale: Normal     Standing balance support: Bilateral upper extremity supported;During functional activity Standing balance-Leahy Scale: Poor Standing balance comment: Reliant on UE support for NWB. Able to maintain standing balance without UE but resulting in TDWBing instead of full NWBing                             Pertinent Vitals/Pain Pain Assessment: No/denies pain    Home Living Family/patient expects to be discharged to:: Private residence Living Arrangements: Spouse/significant other Available Help at Discharge: Family;Available 24 hours/day(Wife reports that "someone will be around whne I am not") Type of Home: House Home Access: Level entry     Home Layout: One level;Other (Comment)(One split level area with a ramp build) Home Equipment: Shower seat - built in;Hand held shower head;Cane - single point      Prior Function Level of Independence: Independent with assistive device(s)         Comments: Using cane since injury in April 2018     Hand Dominance        Extremity/Trunk Assessment   Upper Extremity Assessment Upper Extremity Assessment: Defer to OT evaluation    Lower Extremity Assessment Lower Extremity Assessment: RLE deficits/detail RLE Deficits / Details: s/p SI fusion     Cervical / Trunk Assessment Cervical / Trunk Assessment: Normal  Communication   Communication: No difficulties  Cognition Arousal/Alertness: Awake/alert Behavior During  Therapy: WFL for tasks assessed/performed Overall Cognitive Status: Within Functional Limits for tasks assessed                                        General Comments General comments (skin integrity, edema, etc.): Wife present throughout session. Providing education on difference between TDWB and NWB. Recommending  pt maintain NWB for proper healing. Pt fluctuating between NWB and TDWBing during session    Exercises     Assessment/Plan    PT Assessment Patent does not need any further PT services  PT Problem List         PT Treatment Interventions      PT Goals (Current goals can be found in the Care Plan section)  Acute Rehab PT Goals Patient Stated Goal: Go home today PT Goal Formulation: With patient Time For Goal Achievement: 11/15/17 Potential to Achieve Goals: Good    Frequency     Barriers to discharge        Co-evaluation               AM-PAC PT "6 Clicks" Daily Activity  Outcome Measure Difficulty turning over in bed (including adjusting bedclothes, sheets and blankets)?: None Difficulty moving from lying on back to sitting on the side of the bed? : None Difficulty sitting down on and standing up from a chair with arms (e.g., wheelchair, bedside commode, etc,.)?: None Help needed moving to and from a bed to chair (including a wheelchair)?: None Help needed walking in hospital room?: None Help needed climbing 3-5 steps with a railing? : A Lot 6 Click Score: 22    End of Session   Activity Tolerance: Patient tolerated treatment well Patient left: in bed;with call bell/phone within reach(sitting EOB ) Nurse Communication: Mobility status PT Visit Diagnosis: Other abnormalities of gait and mobility (R26.89)    Time: 1610-9604: 1633-1643 PT Time Calculation (min) (ACUTE ONLY): 10 min   Charges:   PT Evaluation $PT Eval Low Complexity: 1 Low     PT G Codes:        Gladys DammeBrittany Martavis Gurney, PT, DPT  Acute Rehabilitation Services  Pager: [phone]   Lehman PromBrittany S Vidhi Delellis 11/15/2017, 4:58 PM

## 2017-11-15 NOTE — Evaluation (Signed)
Occupational Therapy Evaluation Patient Details Name: [name] MRN: [id number] DOB: 06/12/1970 Today's Date: 11/15/2017    History of Present Illness 47 yo male s/p right SI joint fusion. PMH including anxiety, lymes disease, rocky mountain spotted fever, back surgery, and hip surgery.    Clinical Impression   PTA, pt was living with his wife and was independent. Currently, pt requires supervision for ADLs and functional mobility using RW. Provided education on LB ADLs, toilet transfer, and shower transfer with shower seat; pt demonstrated and verbalized understanding. Providing education on difference between NWB and TDWB; pt fluctuating between [place] status. Continued to provide VCs for NWB. Answered all pt questions. Recommend dc home once medically stable per physician. All acute OT needs met and will sign off. Thank you.     Follow Up Recommendations  No OT follow up;Supervision/Assistance - 24 hour    Equipment Recommendations  None recommended by OT    Recommendations for Other Services PT consult     Precautions / Restrictions Precautions Precautions: Fall Restrictions Weight Bearing Restrictions: Yes RLE Weight Bearing: Non weight bearing Other Position/Activity Restrictions: No order. Assuming conservative protocal with NWBing      Mobility Bed Mobility               General bed mobility comments: Sitting at EOB upon arrival  Transfers Overall transfer level: Needs assistance Equipment used: Rolling walker (2 wheeled) Transfers: Sit to/from Stand Sit to Stand: Supervision         General transfer comment: supervision for safety. No physical A needed    Balance Overall balance assessment: Needs assistance Sitting-balance support: No upper extremity supported;Feet supported Sitting balance-Leahy Scale: Normal     Standing balance support: Bilateral upper extremity supported;During functional activity Standing balance-Leahy Scale:  Poor Standing balance comment: Reliant on UE support for NWB. Able to maintain standing balance without UE but resulting in TDWBing instead of full NWBing                           ADL either performed or assessed with clinical judgement   ADL Overall ADL's : Needs assistance/impaired                                       General ADL Comments: Providing education on LB ADLs, toileting, shower transfer, and fall prevention. Pt performing ADLs and functional mobility at supervision level with RW. Pt demonstrating understanding.      Vision         Perception     Praxis      Pertinent Vitals/Pain Pain Assessment: No/denies pain     Hand Dominance     Extremity/Trunk Assessment Upper Extremity Assessment Upper Extremity Assessment: Overall WFL for tasks assessed   Lower Extremity Assessment Lower Extremity Assessment: RLE deficits/detail RLE Deficits / Details: s/p SI fusion   Cervical / Trunk Assessment Cervical / Trunk Assessment: Normal   Communication Communication Communication: No difficulties   Cognition Arousal/Alertness: Awake/alert Behavior During Therapy: WFL for tasks assessed/performed Overall Cognitive Status: Within Functional Limits for tasks assessed                                     General Comments  Wife present throughout session. Providing education on difference between TDWB and NWB.  Recommending pt maintain NWB for proper healing. Pt fluctuating between NWB and TDWBing during session    Exercises     Shoulder Instructions      Home Living Family/patient expects to be discharged to:: Private residence Living Arrangements: Spouse/significant other Available Help at Discharge: Family;Available 24 hours/day(Wife reports that "someone will be around whne I am not") Type of Home: House Home Access: Level entry     Home Layout: One level;Other (Comment)(One split level area with a ramp build)      Bathroom Shower/Tub: Occupational psychologist: Handicapped height     Home Equipment: Shower seat - built in;Hand held shower head;Cane - single point          Prior Functioning/Environment Level of Independence: Independent with assistive device(s)        Comments: Using cane since injury in April 2018        OT Problem List: Impaired balance (sitting and/or standing);Decreased safety awareness;Decreased knowledge of precautions;Decreased knowledge of use of DME or AE      OT Treatment/Interventions:      OT Goals(Current goals can be found in the care plan section) Acute Rehab OT Goals Patient Stated Goal: Go home today OT Goal Formulation: All assessment and education complete, DC therapy  OT Frequency:     Barriers to D/C:            Co-evaluation              AM-PAC PT "6 Clicks" Daily Activity     Outcome Measure Help from another person eating meals?: None Help from another person taking care of personal grooming?: None Help from another person toileting, which includes using toliet, bedpan, or urinal?: A Little Help from another person bathing (including washing, rinsing, drying)?: A Little Help from another person to put on and taking off regular upper body clothing?: None Help from another person to put on and taking off regular lower body clothing?: A Little 6 Click Score: 21   End of Session Equipment Utilized During Treatment: Rolling walker Nurse Communication: Mobility status;Weight bearing status  Activity Tolerance: Patient tolerated treatment well Patient left: in bed;with call bell/phone within reach;with family/visitor present  OT Visit Diagnosis: Other abnormalities of gait and mobility (R26.89)                Time: 2993-7169 OT Time Calculation (min): 11 min Charges:  OT General Charges $OT Visit: 1 Visit OT Evaluation $OT Eval Low Complexity: 1 Low G-Codes:     Hall Birchard MSOT, OTR/L Acute Rehab Pager:  [phone] Office: [facility] 11/15/2017, 4:20 PM

## 2017-11-15 NOTE — Brief Op Note (Signed)
11/15/2017  1:05 PM  PATIENT:  [name]  47 y.o. male  PRE-OPERATIVE DIAGNOSIS:  Right sacroiliac dysfunction  POST-OPERATIVE DIAGNOSIS:  Right sacroiliac dysfunction  PROCEDURE:  Procedure(s) with comments: SACROILIAC JOINT FUSION (Right) - 120 mins  SURGEON:  Surgeon(s) and Role:    Venita Lick* Zakar Brosch, MD - Primary  PHYSICIAN ASSISTANT:   ASSISTANTS: Anette Riedelarmen Mayo, PA   ANESTHESIA:   general  EBL:  25 mL   BLOOD ADMINISTERED:none  DRAINS: none   LOCAL MEDICATIONS USED:  MARCAINE, Exparel     SPECIMEN:  No Specimen  DISPOSITION OF SPECIMEN:  N/A  COUNTS:  YES  TOURNIQUET:  * No tourniquets in log *  DICTATION: .Dragon Dictation  PLAN OF CARE: Admit for overnight observation  PATIENT DISPOSITION:  PACU - hemodynamically stable.

## 2017-11-15 NOTE — Anesthesia Preprocedure Evaluation (Signed)
Anesthesia Evaluation  Patient identified by MRN, date of birth, ID band Patient awake    Reviewed: Allergy & Precautions, NPO status , Patient's Chart, lab work & pertinent test results  Airway Mallampati: II  TM Distance: >3 FB Neck ROM: Full    Dental no notable dental hx.    Pulmonary Current Smoker,    Pulmonary exam normal breath sounds clear to auscultation       Cardiovascular negative cardio ROS Normal cardiovascular exam Rhythm:Regular Rate:Normal     Neuro/Psych negative neurological ROS  negative psych ROS   GI/Hepatic negative GI ROS, Neg liver ROS,   Endo/Other  negative endocrine ROS  Renal/GU negative Renal ROS  negative genitourinary   Musculoskeletal negative musculoskeletal ROS (+)   Abdominal   Peds negative pediatric ROS (+)  Hematology negative hematology ROS (+)   Anesthesia Other Findings   Reproductive/Obstetrics negative OB ROS                             Anesthesia Physical Anesthesia Plan  ASA: II  Anesthesia Plan: General   Post-op Pain Management:    Induction: Intravenous  PONV Risk Score and Plan: 1 and Ondansetron and Treatment may vary due to age or medical condition  Airway Management Planned: Oral ETT  Additional Equipment:   Intra-op Plan:   Post-operative Plan: Extubation in OR  Informed Consent: I have reviewed the patients History and Physical, chart, labs and discussed the procedure including the risks, benefits and alternatives for the proposed anesthesia with the patient or authorized representative who has indicated his/her understanding and acceptance.   Dental advisory given  Plan Discussed with: CRNA  Anesthesia Plan Comments:         Anesthesia Quick Evaluation  

## 2017-11-15 NOTE — Op Note (Signed)
Operative report  Preoperative diagnosis right sacroiliac joint dysfunction  Postoperative diagnosis: Same  Operative procedure: Right SI joint fusion  Implants: iFuse 3D implant system.   Top: 7.0 x 45 mm Middle: 7.0 x 40 mm Bottom: 7.0 x 35 mm  Complications: None  Indications: This is a very pleasant 47 year old gentleman with significant right SI joint pain after work-related injury.  Pain generator was confirmed with 2 SI joint injections.  After failing conservative care he elected to proceed with surgery.  All appropriate risks benefits and alternatives were discussed and consent was obtained.  Operative report  Patient was brought the operating room placed upon the operating table.  After successful induction of general anesthesia and endotracheal intubation teds SCDs were applied he was turned prone onto a chest and pelvic roll.  The right gluteal region was prepped and draped in a standard fashion.  Timeout was performed confirming patient procedure and all other important data.  Using lateral fluoroscopy I outlined on the skin the anterior and posterior and superior margins of the sacrum.  Once a marked out the sacroiliac joint I then placed the pin percutaneously down to the superior margin of the joint.  I then advanced the pin through the iliac wing and across the SI joint and into the sacrum.  This pins trajectory was just to the superior portion of the S1 foramen.  Using the inlet and outlet view I confirmed that I was in the midportion of the sacrum and then my trajectory was appropriate.  The pin came to rest just lateral and superior to the S1 foramen.  [facility] the [facility] guide[facility] I then placed the second and third pins in a left similar fashion.  The pins traverse the SI joint and all came to rest lateral to the foramen.  Once all 3 guide pins were properly positioned I confirmed satisfactory trajectory and position in both the inlet left as well as lateral x-rays.  Once this was  done I then used an 11 blade scalpel to create a single incision connecting all of the percutaneous pins.  I then used the soft tissue stripper to remove the soft tissue surrounding this guidepin and then placed the targeting device over.  I measured and then placed the broach down to the lateral aspect of the iliac wing.  Using fluoroscopy I advanced the broach across the SI joint and into the sacrum.  This was removed and then the appropriate sized plant was inserted.  The top screw was a 7.0 x 45.  Once this implant was across the SI joint and into the sacrum I remove the guidepin and then used my fluoroscopy to confirm satisfactory positioning in all 3 planes.  Once this was done I repeated this exact same procedure for the middle and bottom implant.  Once all 3 implants were across the sacroiliac joint and well-seated I took my final inlet, outlet and lateral view  In all 3 views I was pleased with the final positioning of the implants.  All 3 spanned the sacroiliac joint, there was no violation of the foramen, and was well-seated in bone in all 3 views.  At this point the wound was copiously irrigated with normal saline and closed in a layered fashion with interrupted #1 Vicryl suture, 2-0 Vicryl suture, and 3-0 Monocryl.  A combination of Marcaine and Exparel was then injected for postoperative analgesia.  Steri-Strips and a dry dressing were applied and the patient was ultimately extubated transferred the PACU without incident.  The end of the case all needle sponge counts were correct there were no adverse intraoperative events.

## 2017-11-15 NOTE — Anesthesia Postprocedure Evaluation (Signed)
Anesthesia Post Note  Patient: [name]  Procedure(s) Performed: SACROILIAC JOINT FUSION (Right )     Patient location during evaluation: PACU Anesthesia Type: General Level of consciousness: awake and alert Pain management: pain level controlled Vital Signs Assessment: post-procedure vital signs reviewed and stable Respiratory status: spontaneous breathing, nonlabored ventilation, respiratory function stable and patient connected to nasal cannula oxygen Cardiovascular status: blood pressure returned to baseline and stable Postop Assessment: no apparent nausea or vomiting Anesthetic complications: no    Last Vitals:  Vitals:   11/15/17 1343 11/15/17 1554  BP: (!) 125/97 122/70  Pulse: 79 83  Resp: 19 18  Temp: 36.6 C 36.6 C  SpO2: 99% 97%    Last Pain:  Vitals:   11/15/17 1554  TempSrc: Oral  PainSc:                  Phillips Groutarignan, [name]

## 2017-11-15 NOTE — Progress Notes (Signed)
Pt doing well. Pt given D/C instructions with Rx's, verbal understanding was provided. Pt's incision is clean and dry with no sign of infection. Pt's IV was removed prior to D/C. Pt D/C'd home via wheelchair @ 1710 per MD order. Pt is stable @ D/C and has no other needs at this time. Rema FendtAshley Brizza Nathanson, RN

## 2017-11-15 NOTE — Transfer of Care (Signed)
Immediate Anesthesia Transfer of Care Note  Patient: [name]  Procedure(s) Performed: SACROILIAC JOINT FUSION (Right )  Patient Location: PACU  Anesthesia Type:General  Level of Consciousness: awake and alert   Airway & Oxygen Therapy: Patient Spontanous Breathing and Patient connected to face mask oxygen  Post-op Assessment: Report given to RN and Post -op Vital signs reviewed and stable  Post vital signs: Reviewed and stable  Last Vitals:  Vitals Value Taken Time  BP    Temp    Pulse    Resp    SpO2      Last Pain:  Vitals:   11/15/17 0855  TempSrc: Oral  PainSc: 7       Patients Stated Pain Goal: 3 (11/15/17 0855)  Complications: No apparent anesthesia complications

## 2017-11-15 NOTE — Anesthesia Procedure Notes (Signed)
Procedure Name: Intubation Date/Time: 11/15/2017 11:00 AM Performed by: Caren Macadamarter, Norvin Ohlin W, CRNA Pre-anesthesia Checklist: Patient identified, Emergency Drugs available, Suction available and Patient being monitored Patient Re-evaluated:Patient Re-evaluated prior to induction Oxygen Delivery Method: Circle system utilized Preoxygenation: Pre-oxygenation with 100% oxygen Induction Type: IV induction Ventilation: Mask ventilation without difficulty Laryngoscope Size: Miller and 2 Grade View: Grade I Tube type: Oral Tube size: 7.5 mm Number of attempts: 1 Airway Equipment and Method: Stylet and Oral airway Placement Confirmation: ETT inserted through vocal cords under direct vision,  positive ETCO2 and breath sounds checked- equal and bilateral Secured at: 24 cm Tube secured with: Tape Dental Injury: Teeth and Oropharynx as per pre-operative assessment

## 2017-11-16 ENCOUNTER — Encounter (HOSPITAL_COMMUNITY): Payer: Self-pay | Admitting: Orthopedic Surgery

## 2017-11-16 NOTE — Discharge Summary (Signed)
Physician Discharge Summary  Patient ID: [name] Chen MRN: [id number] DOB/AGE: 12-12-1970 47 y.o.  Admit date: 11/15/2017 Discharge date: 11/15/17  Admission Diagnoses:  SI joint dysfunction  Discharge Diagnoses:  Active Problems:   S/P lumbar fusion   Past Medical History:  Diagnosis Date  . Anxiety   . Headache   . Lyme disease   . Rocky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     Surgeries: Procedure(s): SACROILIAC JOINT FUSION on 11/15/2017   Consultants (if any):   Discharged Condition: Improved  Hospital Course: [name] is an 47 y.o. male who was admitted 11/15/2017 with a diagnosis of SI joint Dysfunction and went to the operating room on 11/15/2017 and underwent the above named procedures.  Post op day 0 at 510 pm pt reports low level of pain.  Pt has already met with PT pre-op.  Pt understands he is non weight bearing on the right.  Pt is urinating w/o difficulty. Pt is discharged into care of family.   He was given perioperative antibiotics:  Anti-infectives (From admission, onward)   Start     Dose/Rate Route Frequency Ordered Stop   11/15/17 1830  ceFAZolin (ANCEF) IVPB 2g/100 mL premix  Status:  Discontinued     2 g 200 mL/hr over 30 Minutes Intravenous Every 8 hours 11/15/17 1347 11/15/17 2011   11/15/17 0828  ceFAZolin (ANCEF) IVPB 2g/100 mL premix     2 g 200 mL/hr over 30 Minutes Intravenous 30 min pre-op 11/15/17 0828 11/15/17 1120    .  He was given sequential compression devices, early ambulation, and TED for DVT prophylaxis.  He benefited maximally from the hospital stay and there were no complications.    Recent vital signs:  Vitals:   11/15/17 1343 11/15/17 1554  BP: (!) 125/97 122/70  Pulse: 79 83  Resp: 19 18  Temp: 97.9 F (36.6 C) 97.8 F (36.6 C)  SpO2: 99% 97%    Recent laboratory studies:  Lab Results  Component Value Date   HGB 16.1 11/09/2017   HGB 15.8 09/18/2017   HGB 16.0 08/07/2017   Lab Results  Component Value  Date   WBC 10.8 (H) 11/09/2017   PLT 148 (L) 11/09/2017   Lab Results  Component Value Date   INR 0.90 01/10/2017   Lab Results  Component Value Date   NA 140 11/09/2017   K 4.5 11/09/2017   CL 104 11/09/2017   CO2 25 11/09/2017   BUN 8 11/09/2017   CREATININE 1.14 11/09/2017   GLUCOSE 110 (H) 11/09/2017    Discharge Medications:   Allergies as of 11/15/2017      Reactions   Sulfa Antibiotics Anaphylaxis      Medication List    STOP taking these medications   acetaminophen 325 MG tablet Commonly known as:  TYLENOL   diclofenac sodium 1 % Gel Commonly known as:  VOLTAREN   meloxicam 7.5 MG tablet Commonly known as:  MOBIC   traMADol 50 MG tablet Commonly known as:  ULTRAM     TAKE these medications   HYDROcodone-acetaminophen 5-325 MG tablet Commonly known as:  NORCO Take 1-2 tablets by mouth every 4 (four) hours as needed for up to 5 days for moderate pain.   LYRICA 75 MG capsule Generic drug:  pregabalin Take 2 capsules (150 mg total) by mouth 2 (two) times daily for 14 days. What changed:    how much to take  when to take this   methocarbamol 500 MG  tablet Commonly known as:  ROBAXIN Take 1 tablet (500 mg total) by mouth every 8 (eight) hours as needed for muscle spasms. What changed:  when to take this   methocarbamol 500 MG tablet Commonly known as:  ROBAXIN Take 1 tablet (500 mg total) by mouth 3 (three) times daily. What changed:  You were already taking a medication with the same name, and this prescription was added. Make sure you understand how and when to take each.   multivitamin with minerals Tabs tablet Take 1 tablet by mouth daily.   ondansetron 4 MG disintegrating tablet Commonly known as:  ZOFRAN ODT Take 1 tablet (4 mg total) by mouth every 8 (eight) hours as needed for nausea or vomiting.   SUDAFED PE SINUS/ALLERGY PO Take 1 tablet by mouth 2 (two) times daily.       Diagnostic Studies: Dg Si Joints  Result Date:  11/15/2017 CLINICAL DATA:  SI joint fusion EXAM: BILATERAL SACROILIAC JOINTS - 3+ VIEW; DG C-ARM 61-120 MIN COMPARISON:  CT pelvis 07/26/2017 FINDINGS: Prior L5-S1 anterior fusion. Three large triangular fixation bars are visualized traversing the RIGHT SI joint. Bones appear slightly demineralized. No bone destruction identified. IMPRESSION: Post RIGHT SI joint fixation. Electronically Signed   By: Lavonia Dana M.D.   On: 11/15/2017 12:11   Dg C-arm 1-60 Min  Result Date: 11/15/2017 CLINICAL DATA:  SI joint fusion EXAM: BILATERAL SACROILIAC JOINTS - 3+ VIEW; DG C-ARM 61-120 MIN COMPARISON:  CT pelvis 07/26/2017 FINDINGS: Prior L5-S1 anterior fusion. Three large triangular fixation bars are visualized traversing the RIGHT SI joint. Bones appear slightly demineralized. No bone destruction identified. IMPRESSION: Post RIGHT SI joint fixation. Electronically Signed   By: Lavonia Dana M.D.   On: 11/15/2017 12:11    Disposition:  Post op meds provided Pt will present to clinic in 2 weeks  Discharge Instructions    Incentive spirometry RT   Complete by:  As directed       Follow-up Information    Melina Schools, MD Follow up in 2 week(s).   Specialty:  Orthopedic Surgery Contact information: [address] [place] [address] [phone]            Signed: Valinda Hoar 11/16/2017, 2:32 PM

## 2017-12-05 DIAGNOSIS — Z4889 Encounter for other specified surgical aftercare: Secondary | ICD-10-CM | POA: Insufficient documentation

## 2018-01-21 ENCOUNTER — Encounter (HOSPITAL_COMMUNITY): Payer: Self-pay | Admitting: Emergency Medicine

## 2018-01-21 ENCOUNTER — Other Ambulatory Visit: Payer: Self-pay

## 2018-01-21 ENCOUNTER — Emergency Department (HOSPITAL_COMMUNITY)
Admission: EM | Admit: 2018-01-21 | Discharge: 2018-01-21 | Disposition: A | Discharge status: Home / Self Care | Payer: No Typology Code available for payment source | Attending: Emergency Medicine | Admitting: Emergency Medicine

## 2018-01-21 ENCOUNTER — Emergency Department (HOSPITAL_COMMUNITY): Payer: No Typology Code available for payment source

## 2018-01-21 DIAGNOSIS — G8918 Other acute postprocedural pain: Secondary | ICD-10-CM | POA: Insufficient documentation

## 2018-01-21 DIAGNOSIS — Z79899 Other long term (current) drug therapy: Secondary | ICD-10-CM | POA: Insufficient documentation

## 2018-01-21 DIAGNOSIS — M25551 Pain in right hip: Secondary | ICD-10-CM | POA: Insufficient documentation

## 2018-01-21 DIAGNOSIS — M79604 Pain in right leg: Secondary | ICD-10-CM | POA: Insufficient documentation

## 2018-01-21 DIAGNOSIS — F17210 Nicotine dependence, cigarettes, uncomplicated: Secondary | ICD-10-CM | POA: Diagnosis not present

## 2018-01-21 MED ORDER — METHOCARBAMOL 500 MG PO TABS
500.0000 mg | ORAL_TABLET | Freq: Once | ORAL | Status: AC
  Administered 2018-01-21: 500 mg via ORAL
  Filled 2018-01-21: qty 1

## 2018-01-21 MED ORDER — HYDROCODONE-ACETAMINOPHEN 5-325 MG PO TABS
1.0000 | ORAL_TABLET | Freq: Once | ORAL | Status: AC
  Administered 2018-01-21: 1 via ORAL
  Filled 2018-01-21: qty 1

## 2018-01-21 MED ORDER — PREGABALIN 25 MG PO CAPS
150.0000 mg | ORAL_CAPSULE | Freq: Once | ORAL | Status: AC
  Administered 2018-01-21: 150 mg via ORAL
  Filled 2018-01-21: qty 2

## 2018-01-21 MED ORDER — HYDROMORPHONE HCL 1 MG/ML IJ SOLN
2.0000 mg | Freq: Once | INTRAMUSCULAR | Status: AC
  Administered 2018-01-21: 2 mg via INTRAMUSCULAR
  Filled 2018-01-21: qty 2

## 2018-01-21 NOTE — Discharge Instructions (Addendum)
If your pain worsens or you develop fever, vomiting, inability to walk, new numbness or weakness, incontinence or any other new/concerning symptoms and return to the ER for evaluation.

## 2018-01-21 NOTE — ED Triage Notes (Signed)
Pt. Stated, I had SI Joint fusion on Nov 15 2017, by Dr. Shon BatonBrooks, I can't stop my leg from jumping around and a lot of inflammation on the buttocks. Rt. Leg pain and left buttocks is hurting. I can't call Dr. Without getting in touch with my workmen's comp person.

## 2018-01-21 NOTE — ED Notes (Signed)
Patient transported to X-ray 

## 2018-01-21 NOTE — ED Provider Notes (Addendum)
[name] EMERGENCY DEPARTMENT Provider Note   CSN: [id number] Arrival date & time: 01/21/18  1002     History   Chief Complaint Chief Complaint  Patient presents with  . Hip Pain  . Leg Pain    HPI [name] is a 47 y.o. male.  HPI  47 year old male presents with severe right leg and hip pain.  Has been severe for the last 13 hours or so.  Dr. Shon Baton performed an SI joint fusion on 6/19.  He has been nonweightbearing with a walker since until about a week ago.  He is been on hydrocodone, Lyrica, and Robaxin.  However since 820, his medicines have not come through his part of a refill.  It appears to be due to a problem with his Worker's Compensation.  Now is having severe pain and states his leg keeps spasming.  Chronic numbness to his right lower leg but no new numbness and no new weakness.  No incontinence.  No fevers.  Past Medical History:  Diagnosis Date  . Anxiety   . Headache   . Lyme disease   . Rocky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     Patient Active Problem List   Diagnosis Date Noted  . S/P lumbar fusion 11/15/2017    Past Surgical History:  Procedure Laterality Date  . BACK SURGERY    . HIP SURGERY    . SACROILIAC JOINT FUSION Right 11/15/2017   Procedure: SACROILIAC JOINT FUSION;  Surgeon: Venita Lick, MD;  Location: [facility] OR;  Service: Orthopedics;  Laterality: Right;  120 mins  . TONSILLECTOMY     47 years old        Home Medications    Prior to Admission medications   Medication Sig Start Date End Date Taking? Authorizing Provider  Chlorpheniramine-Phenylephrine (SUDAFED PE SINUS/ALLERGY PO) Take 1 tablet by mouth 2 (two) times daily.    [provider]  LYRICA 75 MG capsule Take 2 capsules (150 mg total) by mouth 2 (two) times daily for 14 days. Patient taking differently: Take 75 mg by mouth 3 (three) times daily.  10/08/17 11/07/18  Lennette Bihari, MD  methocarbamol (ROBAXIN) 500 MG tablet Take 1 tablet (500  mg total) by mouth every 8 (eight) hours as needed for muscle spasms. Patient taking differently: Take 500 mg by mouth 3 (three) times daily.  10/17/16   Benjiman Core, MD  methocarbamol (ROBAXIN) 500 MG tablet Take 1 tablet (500 mg total) by mouth 3 (three) times daily. 11/15/17   Mayo, Baxter Kail, PA-C  Multiple Vitamin (MULTIVITAMIN WITH MINERALS) TABS tablet Take 1 tablet by mouth daily.    [provider]  ondansetron (ZOFRAN ODT) 4 MG disintegrating tablet Take 1 tablet (4 mg total) by mouth every 8 (eight) hours as needed for nausea or vomiting. 11/15/17   Mayo, Baxter Kail, PA-C    Family History No family history on file.  Social History Social History   Tobacco Use  . Smoking status: Current Every Day Smoker    Packs/day: 0.50    Years: 30.00    Pack years: 15.00    Types: Cigarettes  . Smokeless tobacco: Never Used  Substance Use Topics  . Alcohol use: Yes    Comment: stopped 2018  . Drug use: No     Allergies   Sulfa antibiotics   Review of Systems Review of Systems  Constitutional: Negative for fever.  Gastrointestinal: Negative for vomiting.  Musculoskeletal: Positive for arthralgias and myalgias.  Neurological: Positive for numbness (chronic, unchanged). Negative for weakness.  All other systems reviewed and are negative.    Physical Exam Updated Vital Signs BP 140/81   Pulse 66   Temp 98.2 F (36.8 C) (Oral)   Resp 18   Ht 6' (1.829 m)   Wt 104.3 kg   SpO2 100%   BMI 31.19 kg/m   Physical Exam  Constitutional: He is oriented to person, place, and time. He appears well-developed and well-nourished. He appears distressed.  HENT:  Head: Normocephalic and atraumatic.  Right Ear: External ear normal.  Left Ear: External ear normal.  Nose: Nose normal.  Eyes: Right eye exhibits no discharge. Left eye exhibits no discharge.  Neck: Neck supple.  Cardiovascular: Normal rate, regular rhythm and normal heart sounds.  Pulses:       Dorsalis pedis pulses are 2+ on the right side, and 2+ on the left side.  Pulmonary/Chest: Effort normal and breath sounds normal.  Abdominal: Soft. He exhibits no distension. There is no tenderness.  Musculoskeletal: He exhibits no edema.       Right hip: He exhibits decreased range of motion (due to pain).       Legs: No significant tenderness to RLE except at hip/pelvis Decreased sensation to Right lateral lower leg, chronic  Neurological: He is alert and oriented to person, place, and time.  5/5 strength in BLE.   Skin: Skin is warm. He is not diaphoretic.  Nursing note and vitals reviewed.    ED Treatments / Results  Labs (all labs ordered are listed, but only abnormal results are displayed) Labs Reviewed - No data to display  EKG None  Radiology Dg Si Joints  Result Date: 01/21/2018 CLINICAL DATA:  Status post sacroiliac joint fusion 2 months ago. Right leg and left buttocks pain. EXAM: BILATERAL SACROILIAC JOINTS - 3+ VIEW COMPARISON:  Fluoroscopic images of November 15, 2017. FINDINGS: Status post surgical fusion of right sacroiliac joint and L5-S1 disc space. Left sacroiliac joint is unremarkable. No fracture or dislocation is noted. Both hip joints are unremarkable. IMPRESSION: Status post surgical fusion of right sacroiliac joint and L5-S1 disc space. No other abnormality seen in the pelvis. Electronically Signed   By: Lupita RaiderJames  Green Jr, M.D.   On: 01/21/2018 12:19    Procedures Procedures (including critical care time)  Medications Ordered in ED Medications  HYDROmorphone (DILAUDID) injection 2 mg (2 mg Intramuscular Given 01/21/18 1152)  pregabalin (LYRICA) capsule 150 mg (150 mg Oral Given 01/21/18 1148)  methocarbamol (ROBAXIN) tablet 500 mg (500 mg Oral Given 01/21/18 1148)  HYDROcodone-acetaminophen (NORCO/VICODIN) 5-325 MG per tablet 1 tablet (1 tablet Oral Given 01/21/18 1151)     Initial Impression / Assessment and Plan / ED Course  I have reviewed the triage  vital signs and the nursing notes.  Pertinent labs & imaging results that were available during my care of the patient were reviewed by me and considered in my medical decision making (see chart for details).     Patient is a little worried that his joints are not fully aligned like typical.  X-ray shows good status of the surgical fusion.  Pain is significant improved after IM Dilaudid and his oral meds here.  He feels much better and stable for discharge home.  He has no signs of an acute neurologic emergency.  Neurovascular intact.  Return precautions. He will call his doctors and worker's comp for his outpatient meds.  Final Clinical Impressions(s) / ED Diagnoses   Final  diagnoses:  Acute post-operative pain    ED Discharge Orders    None       Pricilla Loveless, MD 01/21/18 1241    Pricilla Loveless, MD 01/21/18 1242

## 2018-01-21 NOTE — ED Notes (Signed)
Pt stable, ambulatory, states understanding of discharge instructions 

## 2018-02-03 ENCOUNTER — Emergency Department (HOSPITAL_COMMUNITY): Payer: Self-pay

## 2018-02-03 ENCOUNTER — Encounter (HOSPITAL_COMMUNITY): Payer: Self-pay | Admitting: Emergency Medicine

## 2018-02-03 ENCOUNTER — Other Ambulatory Visit: Payer: Self-pay

## 2018-02-03 ENCOUNTER — Emergency Department (HOSPITAL_COMMUNITY)
Admission: EM | Admit: 2018-02-03 | Discharge: 2018-02-03 | Disposition: A | Discharge status: Home / Self Care | Payer: Self-pay | Attending: Emergency Medicine | Admitting: Emergency Medicine

## 2018-02-03 DIAGNOSIS — R112 Nausea with vomiting, unspecified: Secondary | ICD-10-CM | POA: Insufficient documentation

## 2018-02-03 DIAGNOSIS — L509 Urticaria, unspecified: Secondary | ICD-10-CM | POA: Insufficient documentation

## 2018-02-03 DIAGNOSIS — R05 Cough: Secondary | ICD-10-CM | POA: Insufficient documentation

## 2018-02-03 DIAGNOSIS — R0989 Other specified symptoms and signs involving the circulatory and respiratory systems: Secondary | ICD-10-CM | POA: Insufficient documentation

## 2018-02-03 DIAGNOSIS — F17210 Nicotine dependence, cigarettes, uncomplicated: Secondary | ICD-10-CM | POA: Insufficient documentation

## 2018-02-03 DIAGNOSIS — Z79899 Other long term (current) drug therapy: Secondary | ICD-10-CM | POA: Insufficient documentation

## 2018-02-03 DIAGNOSIS — R748 Abnormal levels of other serum enzymes: Secondary | ICD-10-CM | POA: Insufficient documentation

## 2018-02-03 DIAGNOSIS — R070 Pain in throat: Secondary | ICD-10-CM | POA: Insufficient documentation

## 2018-02-03 LAB — COMPREHENSIVE METABOLIC PANEL
ALBUMIN: 4.3 g/dL (ref 3.5–5.0)
ALK PHOS: 64 U/L (ref 38–126)
ALT: 24 U/L (ref 0–44)
ANION GAP: 8 (ref 5–15)
AST: 30 U/L (ref 15–41)
BUN: 6 mg/dL (ref 6–20)
CALCIUM: 9.4 mg/dL (ref 8.9–10.3)
CO2: 22 mmol/L (ref 22–32)
Chloride: 109 mmol/L (ref 98–111)
Creatinine, Ser: 1.09 mg/dL (ref 0.61–1.24)
GFR calc Af Amer: >60 mL/min (ref >60)
GFR calc non Af Amer: >60 mL/min (ref >60)
GLUCOSE: 127 mg/dL — AB (ref 70–99)
POTASSIUM: 4 mmol/L (ref 3.5–5.1)
SODIUM: 139 mmol/L (ref 135–145)
Total Bilirubin: 1.2 mg/dL (ref 0.3–1.2)
Total Protein: 7 g/dL (ref 6.5–8.1)

## 2018-02-03 LAB — URINALYSIS, ROUTINE W REFLEX MICROSCOPIC
BACTERIA UA: NONE SEEN
BILIRUBIN URINE: NEGATIVE
Glucose, UA: NEGATIVE mg/dL
Ketones, ur: NEGATIVE mg/dL
LEUKOCYTES UA: NEGATIVE
NITRITE: NEGATIVE
PROTEIN: NEGATIVE mg/dL
Specific Gravity, Urine: 1.006 (ref 1.005–1.030)
pH: 7 (ref 5.0–8.0)

## 2018-02-03 LAB — LIPASE, BLOOD: Lipase: 106 U/L — ABNORMAL HIGH (ref 11–51)

## 2018-02-03 LAB — CBC
HEMATOCRIT: 47.4 % (ref 39.0–52.0)
HEMOGLOBIN: 15.5 g/dL (ref 13.0–17.0)
MCH: 30.2 pg (ref 26.0–34.0)
MCHC: 32.7 g/dL (ref 30.0–36.0)
MCV: 92.4 fL (ref 78.0–100.0)
Platelets: 220 10*3/uL (ref 150–400)
RBC: 5.13 MIL/uL (ref 4.22–5.81)
RDW: 12.1 % (ref 11.5–15.5)
WBC: 10.7 10*3/uL — ABNORMAL HIGH (ref 4.0–10.5)

## 2018-02-03 MED ORDER — SODIUM CHLORIDE 0.9 % IV BOLUS
1000.0000 mL | Freq: Once | INTRAVENOUS | Status: AC
  Administered 2018-02-03: 1000 mL via INTRAVENOUS

## 2018-02-03 MED ORDER — DIPHENHYDRAMINE HCL 50 MG/ML IJ SOLN
25.0000 mg | Freq: Once | INTRAMUSCULAR | Status: AC
  Administered 2018-02-03: 25 mg via INTRAVENOUS
  Filled 2018-02-03: qty 1

## 2018-02-03 MED ORDER — DIPHENHYDRAMINE HCL 25 MG PO TABS: 25.0000 mg | ORAL_TABLET | Freq: Four times a day (QID) | ORAL | 0 refills | Status: DC

## 2018-02-03 MED ORDER — PREDNISONE 50 MG PO TABS: 50.0000 mg | ORAL_TABLET | Freq: Every day | ORAL | 0 refills | Status: DC

## 2018-02-03 NOTE — ED Notes (Signed)
Patient verbalizes understanding of medications and discharge instructions. No further questions at this time. VSS and patient ambulatory at discharge.   

## 2018-02-03 NOTE — ED Triage Notes (Signed)
Pt states he takes lyrica on a regular basis. He ran out of this, and started taking it again on the 26th. He began to show hives on the 27th and has been taking benadryl.  States he had it this morning at 530 and immediately threw up. States he feels like something is stuck in his throat. Pt speech is clear, no shortness of breath, no acute distress. Pt has rash to arm pits and groin. Pt has been taking benadryl. No large welts noted anywhere.

## 2018-02-03 NOTE — ED Provider Notes (Signed)
[name] EMERGENCY DEPARTMENT Provider Note   CSN: [id number] Arrival date & time: 02/03/18  1617     History   Chief Complaint Chief Complaint  Patient presents with  . Emesis  . possible allergic reaction    HPI [name] is a 47 y.o. male.  HPI Pt states his lyrica was changed from brand name to generic.  Since that chance he noticed a rash on his torso.  It is red and raised and itchy.  He has continued his medications.  Yesterday he started vomiting.  He has some throat irritation but no sore throat.  He feels like something in his throat. He has been coughing.  No abdominal pain. Past Medical History:  Diagnosis Date  . Anxiety   . Headache   . Lyme disease   . Rocky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     Patient Active Problem List   Diagnosis Date Noted  . S/P lumbar fusion 11/15/2017    Past Surgical History:  Procedure Laterality Date  . BACK SURGERY    . HIP SURGERY    . SACROILIAC JOINT FUSION Right 11/15/2017   Procedure: SACROILIAC JOINT FUSION;  Surgeon: Venita Lick, MD;  Location: [facility] OR;  Service: Orthopedics;  Laterality: Right;  120 mins  . TONSILLECTOMY     47 years old        Home Medications    Prior to Admission medications   Medication Sig Start Date End Date Taking? Authorizing Provider  Chlorpheniramine-Phenylephrine (SUDAFED PE SINUS/ALLERGY PO) Take 1 tablet by mouth 2 (two) times daily.    [provider]  diphenhydrAMINE (BENADRYL) 25 MG tablet Take 1 tablet (25 mg total) by mouth every 6 (six) hours. 02/03/18   Linwood Dibbles, MD  LYRICA 75 MG capsule Take 2 capsules (150 mg total) by mouth 2 (two) times daily for 14 days. Patient taking differently: Take 75 mg by mouth 3 (three) times daily.  10/08/17 11/07/18  Lennette Bihari, MD  methocarbamol (ROBAXIN) 500 MG tablet Take 1 tablet (500 mg total) by mouth every 8 (eight) hours as needed for muscle spasms. Patient taking differently: Take 500 mg by mouth  3 (three) times daily.  10/17/16   Benjiman Core, MD  methocarbamol (ROBAXIN) 500 MG tablet Take 1 tablet (500 mg total) by mouth 3 (three) times daily. 11/15/17   Mayo, Baxter Kail, PA-C  Multiple Vitamin (MULTIVITAMIN WITH MINERALS) TABS tablet Take 1 tablet by mouth daily.    [provider]  ondansetron (ZOFRAN ODT) 4 MG disintegrating tablet Take 1 tablet (4 mg total) by mouth every 8 (eight) hours as needed for nausea or vomiting. 11/15/17   Mayo, Baxter Kail, PA-C  predniSONE (DELTASONE) 50 MG tablet Take 1 tablet (50 mg total) by mouth daily. 02/03/18   Linwood Dibbles, MD    Family History No family history on file.  Social History Social History   Tobacco Use  . Smoking status: Current Every Day Smoker    Packs/day: 0.50    Years: 30.00    Pack years: 15.00    Types: Cigarettes  . Smokeless tobacco: Never Used  Substance Use Topics  . Alcohol use: Yes    Comment: stopped 2018  . Drug use: No     Allergies   Sulfa antibiotics   Review of Systems Review of Systems  All other systems reviewed and are negative.    Physical Exam Updated Vital Signs BP (!) 126/92   Pulse 72  Temp 99.5 F (37.5 C) (Oral)   Resp (!) 23   Ht 1.829 m (6')   Wt 108 kg   SpO2 99%   BMI 32.28 kg/m   Physical Exam  Constitutional: He appears well-developed and well-nourished. No distress.  HENT:  Head: Normocephalic and atraumatic.  Right Ear: External ear normal.  Left Ear: External ear normal.  Eyes: Conjunctivae are normal. Right eye exhibits no discharge. Left eye exhibits no discharge. No scleral icterus.  Neck: Neck supple. No tracheal deviation present.  Cardiovascular: Normal rate, regular rhythm and intact distal pulses.  Pulmonary/Chest: Effort normal and breath sounds normal. No stridor. No respiratory distress. He has no wheezes. He has no rales.  Abdominal: Soft. Bowel sounds are normal. He exhibits no distension. There is no tenderness. There is no  rebound and no guarding.  Musculoskeletal: He exhibits no edema or tenderness.  Neurological: He is alert. He has normal strength. No cranial nerve deficit (no facial droop, extraocular movements intact, no slurred speech) or sensory deficit. He exhibits normal muscle tone. He displays no seizure activity. Coordination normal.  Skin: Skin is warm and dry. Rash noted.  Faint erythematous rash, questionable mild urticarial lesions in the lower abdomen  Psychiatric: He has a normal mood and affect.  Nursing note and vitals reviewed.    ED Treatments / Results  Labs (all labs ordered are listed, but only abnormal results are displayed) Labs Reviewed  LIPASE, BLOOD - Abnormal; Notable for the following components:      Result Value   Lipase 106 (*)    All other components within normal limits  COMPREHENSIVE METABOLIC PANEL - Abnormal; Notable for the following components:   Glucose, Bld 127 (*)    All other components within normal limits  CBC - Abnormal; Notable for the following components:   WBC 10.7 (*)    All other components within normal limits  URINALYSIS, ROUTINE W REFLEX MICROSCOPIC - Abnormal; Notable for the following components:   Color, Urine STRAW (*)    Hgb urine dipstick SMALL (*)    All other components within normal limits    EKG EKG Interpretation  Date/Time:  Saturday February 03 2018 16:23:04 EDT Ventricular Rate:  102 PR Interval:  142 QRS Duration: 88 QT Interval:  340 QTC Calculation: 443 R Axis:   92 Text Interpretation:  Sinus tachycardia Rightward axis Borderline ECG Since last tracing rate faster Confirmed by Linwood Dibbles [id number]) on 02/03/2018 5:40:05 PM   Radiology Dg Chest 2 View  Result Date: 02/03/2018 CLINICAL DATA:  Fever and dyspnea when laying flat today. Hives for 10 days. EXAM: CHEST - 2 VIEW COMPARISON:  09/18/2017 FINDINGS: The heart size and mediastinal contours are within normal limits. Both lungs are clear. The visualized skeletal  structures are unremarkable. IMPRESSION: No active cardiopulmonary disease. Electronically Signed   By: Tollie Eth M.D.   On: 02/03/2018 19:02    Procedures Procedures (including critical care time)  Medications Ordered in ED Medications  sodium chloride 0.9 % bolus 1,000 mL (0 mLs Intravenous Stopped 02/03/18 1924)  diphenhydrAMINE (BENADRYL) injection 25 mg (25 mg Intravenous Given 02/03/18 1806)     Initial Impression / Assessment and Plan / ED Course  I have reviewed the triage vital signs and the nursing notes.  Pertinent labs & imaging results that were available during my care of the patient were reviewed by me and considered in my medical decision making (see chart for details).  Clinical Course as of Feb 03 2109  Sat Feb 03, 2018  2106 Labs reviewed.   [JK]  2108 Patient noted to have an elevated lipase however is not having any abdominal pain.   [JK]  2108 CBC and electrolyte panel otherwise unremarkable.  Chest x-ray negative   [JK]    Clinical Course User Index [JK] Linwood Dibbles, MD    She presented to the emergency room for evaluation of a possible reaction associated with his Lyrica.  Patient did switch from brand name to generic version.  On exam he is a mild rash but no diffuse urticaria .  Patient described having nausea and vomiting earlier as well which does not sound like an allergic reaction.  Laboratory tests were notable for an elevated lipase however the patient denies any abdominal pain right now.  His exam is reassuring.  He is not having any pain.  He is not vomiting.  I do not think imaging is necessary at this point.  I recommend he follow-up with his primary care doctor to have his lipase rechecked.  Because of the urticaria and throat tightness he noticed earlier I will have him take a course of antihistamines and steroids.  Final Clinical Impressions(s) / ED Diagnoses   Final diagnoses:  Urticaria  Non-intractable vomiting with nausea, unspecified  vomiting type  Elevated lipase    ED Discharge Orders         Ordered    diphenhydrAMINE (BENADRYL) 25 MG tablet  Every 6 hours     02/03/18 2109    predniSONE (DELTASONE) 50 MG tablet  Daily     02/03/18 2110           Linwood Dibbles, MD 02/03/18 2112

## 2018-02-03 NOTE — Discharge Instructions (Addendum)
Take the medications as prescribed to help with the allergic reaction, follow-up with your primary care doctor to have your lipase rechecked, return as needed for worsening symptoms

## 2018-02-03 NOTE — ED Notes (Signed)
Patient ambulated to bathroom and was not given a specimen cup. Patient notified we need a specimen, states "well I just went so it will be a little while now. They should have said something before".

## 2018-02-14 ENCOUNTER — Emergency Department (HOSPITAL_COMMUNITY): Payer: No Typology Code available for payment source

## 2018-02-14 ENCOUNTER — Emergency Department (HOSPITAL_COMMUNITY)
Admission: EM | Admit: 2018-02-14 | Discharge: 2018-02-14 | Disposition: A | Discharge status: Home / Self Care | Payer: Self-pay | Attending: Emergency Medicine | Admitting: Emergency Medicine

## 2018-02-14 ENCOUNTER — Encounter (HOSPITAL_COMMUNITY): Payer: Self-pay | Admitting: Emergency Medicine

## 2018-02-14 ENCOUNTER — Other Ambulatory Visit: Payer: Self-pay

## 2018-02-14 DIAGNOSIS — R112 Nausea with vomiting, unspecified: Secondary | ICD-10-CM | POA: Insufficient documentation

## 2018-02-14 DIAGNOSIS — R1013 Epigastric pain: Secondary | ICD-10-CM | POA: Insufficient documentation

## 2018-02-14 DIAGNOSIS — R109 Unspecified abdominal pain: Secondary | ICD-10-CM

## 2018-02-14 DIAGNOSIS — L509 Urticaria, unspecified: Secondary | ICD-10-CM | POA: Insufficient documentation

## 2018-02-14 DIAGNOSIS — R0789 Other chest pain: Secondary | ICD-10-CM | POA: Insufficient documentation

## 2018-02-14 DIAGNOSIS — Z79899 Other long term (current) drug therapy: Secondary | ICD-10-CM | POA: Insufficient documentation

## 2018-02-14 DIAGNOSIS — F17210 Nicotine dependence, cigarettes, uncomplicated: Secondary | ICD-10-CM | POA: Insufficient documentation

## 2018-02-14 LAB — URINALYSIS, ROUTINE W REFLEX MICROSCOPIC
Bacteria, UA: NONE SEEN
Bilirubin Urine: NEGATIVE
Glucose, UA: NEGATIVE mg/dL
Ketones, ur: NEGATIVE mg/dL
Leukocytes, UA: NEGATIVE
Nitrite: NEGATIVE
Protein, ur: NEGATIVE mg/dL
Specific Gravity, Urine: 1.003 — ABNORMAL LOW (ref 1.005–1.030)
pH: 6 (ref 5.0–8.0)

## 2018-02-14 LAB — COMPREHENSIVE METABOLIC PANEL WITH GFR
ALT: 31 U/L (ref 0–44)
AST: 30 U/L (ref 15–41)
Albumin: 4.3 g/dL (ref 3.5–5.0)
Alkaline Phosphatase: 67 U/L (ref 38–126)
Anion gap: 12 (ref 5–15)
BUN: 7 mg/dL (ref 6–20)
CO2: 26 mmol/L (ref 22–32)
Calcium: 10 mg/dL (ref 8.9–10.3)
Chloride: 103 mmol/L (ref 98–111)
Creatinine, Ser: 1.07 mg/dL (ref 0.61–1.24)
GFR calc Af Amer: >60 mL/min
GFR calc non Af Amer: >60 mL/min
Glucose, Bld: 101 mg/dL — ABNORMAL HIGH (ref 70–99)
Potassium: 4.1 mmol/L (ref 3.5–5.1)
Sodium: 141 mmol/L (ref 135–145)
Total Bilirubin: 0.8 mg/dL (ref 0.3–1.2)
Total Protein: 7.4 g/dL (ref 6.5–8.1)

## 2018-02-14 LAB — LIPASE, BLOOD: Lipase: 40 U/L (ref 11–51)

## 2018-02-14 LAB — CBC
HCT: 48.6 % (ref 39.0–52.0)
Hemoglobin: 16 g/dL (ref 13.0–17.0)
MCH: 30.6 pg (ref 26.0–34.0)
MCHC: 32.9 g/dL (ref 30.0–36.0)
MCV: 92.9 fL (ref 78.0–100.0)
PLATELETS: 223 10*3/uL (ref 150–400)
RBC: 5.23 MIL/uL (ref 4.22–5.81)
RDW: 12.2 % (ref 11.5–15.5)
WBC: 10.3 10*3/uL (ref 4.0–10.5)

## 2018-02-14 MED ORDER — FAMOTIDINE IN NACL 20-0.9 MG/50ML-% IV SOLN
20.0000 mg | Freq: Once | INTRAVENOUS | Status: AC
  Administered 2018-02-14: 20 mg via INTRAVENOUS
  Filled 2018-02-14: qty 50

## 2018-02-14 MED ORDER — DIPHENHYDRAMINE HCL 25 MG PO TABS: 25.0000 mg | ORAL_TABLET | Freq: Four times a day (QID) | ORAL | 0 refills | Status: DC

## 2018-02-14 MED ORDER — FAMOTIDINE 20 MG PO TABS: 20.0000 mg | ORAL_TABLET | Freq: Two times a day (BID) | ORAL | 0 refills | Status: DC

## 2018-02-14 MED ORDER — IOHEXOL 300 MG/ML  SOLN
100.0000 mL | Freq: Once | INTRAMUSCULAR | Status: AC | PRN
  Administered 2018-02-14: 100 mL via INTRAVENOUS

## 2018-02-14 MED ORDER — PREDNISONE 20 MG PO TABS: 40.0000 mg | ORAL_TABLET | Freq: Every day | ORAL | 0 refills | Status: AC

## 2018-02-14 MED ORDER — METHYLPREDNISOLONE SODIUM SUCC 125 MG IJ SOLR
125.0000 mg | Freq: Once | INTRAMUSCULAR | Status: AC
  Administered 2018-02-14: 125 mg via INTRAVENOUS
  Filled 2018-02-14: qty 2

## 2018-02-14 MED ORDER — DIPHENHYDRAMINE HCL 50 MG/ML IJ SOLN
25.0000 mg | Freq: Once | INTRAMUSCULAR | Status: AC
  Administered 2018-02-14: 25 mg via INTRAVENOUS
  Filled 2018-02-14: qty 1

## 2018-02-14 NOTE — ED Notes (Signed)
Pt back from CT

## 2018-02-14 NOTE — Discharge Instructions (Signed)
Please call an allergist for follow-up

## 2018-02-14 NOTE — ED Triage Notes (Signed)
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 Also has rash to trunk- hives- from lyrica per pt. Has not taken any lyrica since 02/05/18.  Last dose of benadryl 25mg  at 6am.  Sent by dr for "imaging to make sure that it isn't my gallbladder"

## 2018-02-14 NOTE — ED Notes (Addendum)
Pt transported to CT ?

## 2018-02-14 NOTE — ED Notes (Signed)
Patient verbalizes understanding of discharge instructions. Opportunity for questioning and answers were provided. Armband removed by staff, pt discharged from ED ambulatory.   

## 2018-02-15 NOTE — ED Provider Notes (Signed)
[name] Provider Note   CSN: [id number] Arrival date & time: 02/14/18  1001     History   Chief Complaint Chief Complaint  Patient presents with  . Pancreatitis  . Abdominal Pain    HPI [name] is a 47 y.o. male.  HPI Patient is a 47 year old male presents the emergency department 3 weeks of abdominal wall chest and arm hives as well as intermittent abdominal pain associated with exacerbation of the hives.  He denies any recent tick bites but does report a history of [place] spotted fever last year.  He denies a history of asthma or eczema.  No history of allergic reactions before.  Denies fevers or chills.  Reports occasional nausea and vomiting.  Vomit is nonbloody nonbilious.  Abdominal discomfort is epigastric in nature.  He was thought to have possible pancreatitis by his primary care physician who was sent to the ER for further evaluation.  No other complaints at this time.  He is tried Benadryl at home for his hives which provide some improvement.  He has not been on steroids.  He has made dietary changes to see if this helps with his hives and is not to this point.   Past Medical History:  Diagnosis Date  . Anxiety   . Headache   . Lyme disease   . 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     Patient Active Problem List   Diagnosis Date Noted  . S/P lumbar fusion 11/15/2017    Past Surgical History:  Procedure Laterality Date  . BACK SURGERY    . HIP SURGERY    . SACROILIAC JOINT FUSION Right 11/15/2017   Procedure: SACROILIAC JOINT FUSION;  Surgeon: Venita LickBrooks, Dahari, MD;  [facility];  Service: Orthopedics;  Laterality: Right;  120 mins  . TONSILLECTOMY     47 years old        Home Medications    Prior to Admission medications   Medication Sig Start Date End Date Taking? Authorizing Provider  acetaminophen (TYLENOL) 500 MG tablet Take 500 mg by mouth as needed. 01/21/18  Yes [provider]    Chlorpheniramine-Phenylephrine (SUDAFED PE SINUS/ALLERGY PO) Take 1 tablet by mouth 2 (two) times daily.   Yes [provider]  HYDROcodone-acetaminophen (NORCO/VICODIN) 5-325 MG tablet Take 1 tablet by mouth as needed.   Yes [provider]  methocarbamol (ROBAXIN) 500 MG tablet Take 1 tablet (500 mg total) by mouth every 8 (eight) hours as needed for muscle spasms. Patient taking differently: Take 500 mg by mouth 3 (three) times daily.  10/17/16  Yes Benjiman CorePickering, Nathan, MD  Multiple Vitamin (MULTIVITAMIN WITH MINERALS) TABS tablet Take 1 tablet by mouth daily. gummie   Yes [provider]  ondansetron (ZOFRAN ODT) 4 MG disintegrating tablet Take 1 tablet (4 mg total) by mouth every 8 (eight) hours as needed for nausea or vomiting. 11/15/17  Yes Mayo, Baxter Kailarmen Christina, PA-C  diphenhydrAMINE (BENADRYL) 25 MG tablet Take 1 tablet (25 mg total) by mouth every 6 (six) hours. 02/14/18   Azalia Bilisampos, Cyrena Kuchenbecker, MD  famotidine (PEPCID) 20 MG tablet Take 1 tablet (20 mg total) by mouth 2 (two) times daily. 02/14/18   Azalia Bilisampos, Renn Dirocco, MD  LYRICA 75 MG capsule Take 2 capsules (150 mg total) by mouth 2 (two) times daily for 14 days. Patient not taking: Reported on 02/14/2018 10/08/17 11/07/18  Lennette BihariButler, John Michael, MD  methocarbamol (ROBAXIN) 500 MG tablet Take 1 tablet (500 mg total)  by mouth 3 (three) times daily. Patient not taking: Reported on 02/14/2018 11/15/17   Mayo, Baxter Kail, PA-C  predniSONE (DELTASONE) 20 MG tablet Take 2 tablets (40 mg total) by mouth daily for 5 days. 02/14/18 02/19/18  Azalia Bilis, MD    Family History No family history on file.  Social History Social History   Tobacco Use  . Smoking status: Current Every Day Smoker    Packs/day: 0.50    Years: 30.00    Pack years: 15.00    Types: Cigarettes  . Smokeless tobacco: Never Used  Substance Use Topics  . Alcohol use: Yes    Comment: stopped 2018  . Drug use: No     Allergies   Pregabalin and Sulfa  antibiotics   Review of Systems Review of Systems  All other systems reviewed and are negative.    Physical Exam Updated Vital Signs BP 125/77   Pulse 85   Temp 98.6 F (37 C) (Oral)   Resp 16   Ht 6' (1.829 m)   Wt 100.2 kg   SpO2 96%   BMI 29.97 kg/m   Physical Exam  Constitutional: He is oriented to person, place, and time. He appears well-developed and well-nourished.  HENT:  Head: Normocephalic and atraumatic.  Eyes: EOM are normal.  Neck: Normal range of motion.  Cardiovascular: Normal rate, regular rhythm, normal heart sounds and intact distal pulses.  Pulmonary/Chest: Effort normal and breath sounds normal. No respiratory distress.  Abdominal: Soft. He exhibits no distension. There is no tenderness.  Musculoskeletal: Normal range of motion.  Neurological: He is alert and oriented to person, place, and time.  Skin: Skin is warm and dry.  Hives across the abdomen and anterior chest as well as bilateral axilla and low back  Psychiatric: He has a normal mood and affect. Judgment normal.  Nursing note and vitals reviewed.    ED Treatments / Results  Labs (all labs ordered are listed, but only abnormal results are displayed) Labs Reviewed  COMPREHENSIVE METABOLIC PANEL - Abnormal; Notable for the following components:      Result Value   Glucose, Bld 101 (*)    All other components within normal limits  URINALYSIS, ROUTINE W REFLEX MICROSCOPIC - Abnormal; Notable for the following components:   Color, Urine STRAW (*)    Specific Gravity, Urine 1.003 (*)    Hgb urine dipstick SMALL (*)    All other components within normal limits  LIPASE, BLOOD  CBC    EKG None  Radiology Ct Abdomen Pelvis W Contrast  Result Date: 02/14/2018 CLINICAL DATA:  Abdominal pain and history of pancreatitis EXAM: CT ABDOMEN AND PELVIS WITH CONTRAST TECHNIQUE: Multidetector CT imaging of the abdomen and pelvis was performed using the standard protocol following bolus  administration of intravenous contrast. CONTRAST:  OMNIPAQUE IOHEXOL 300 MG/ML  SOLN COMPARISON:  12/28/2016 FINDINGS: Lower chest: No acute abnormality. Hepatobiliary: Fatty infiltration of the liver is noted. The gallbladder is within normal limits. Pancreas: Unremarkable. No pancreatic ductal dilatation or surrounding inflammatory changes. Spleen: Scattered calcified granulomas are noted. Adrenals/Urinary Tract: Adrenal glands are within normal limits. The kidneys are well visualized bilaterally. No renal calculi or obstructive changes are seen. Bladder is decompressed. Stomach/Bowel: Scattered diverticular change of the colon is noted without evidence of diverticulitis. The appendix is within normal limits. No small bowel abnormality is noted. The stomach is decompressed. Vascular/Lymphatic: No significant vascular findings are present. No enlarged abdominal or pelvic lymph nodes. Reproductive: Prostate is unremarkable. Other:  No abdominal wall hernia or abnormality. No abdominopelvic ascites. Musculoskeletal: Postsurgical changes in the right sacroiliac joint and lumbosacral junction are noted. No acute bony abnormality is seen. IMPRESSION: Chronic changes as described above. No acute abnormality to correspond with the patient's given clinical history is noted. Specifically there are no changes to suggest pancreatitis. Electronically Signed   By: Alcide Clever M.D.   On: 02/14/2018 15:06    Procedures Procedures (including critical care time)  Medications Ordered in ED Medications  diphenhydrAMINE (BENADRYL) injection 25 mg (25 mg Intravenous Given 02/14/18 1224)  famotidine (PEPCID) IVPB 20 mg premix (0 mg Intravenous Stopped 02/14/18 1409)  methylPREDNISolone sodium succinate (SOLU-MEDROL) 125 mg/2 mL injection 125 mg (125 mg Intravenous Given 02/14/18 1224)  iohexol (OMNIPAQUE) 300 MG/ML solution 100 mL (100 mLs Intravenous Contrast Given 02/14/18 1344)     Initial Impression / Assessment and  Plan / ED Course  I have reviewed the triage vital signs and the nursing notes.  Pertinent labs & imaging results that were available during my care of the patient were reviewed by me and considered in my medical decision making (see chart for details).     Overall this seems to be an allergic type process with associated hives.  He may have abdominal urticaria as well leading to his nausea.  Given his history of tick bite alpha gal allergy is a definite possibility as well.  He will need evaluation by an allergist.  We will treat the patient with steroids, Pepcid, Benadryl to improve his hives and see if this helps his abdominal pain.  CT imaging is otherwise reassuring of his abdomen and pelvis.  Close primary care follow-up.  Patient is encouraged to follow-up in the emergency department for new or worsening symptoms  Final Clinical Impressions(s) / ED Diagnoses   Final diagnoses:  Acute abdominal pain  Urticaria    ED Discharge Orders         Ordered    diphenhydrAMINE (BENADRYL) 25 MG tablet  Every 6 hours     02/14/18 1544    famotidine (PEPCID) 20 MG tablet  2 times daily     02/14/18 1544    predniSONE (DELTASONE) 20 MG tablet  Daily     02/14/18 1544           Azalia Bilis, MD 02/15/18 1607

## 2018-04-30 ENCOUNTER — Ambulatory Visit (INDEPENDENT_AMBULATORY_CARE_PROVIDER_SITE_OTHER): Payer: Self-pay | Admitting: Allergy and Immunology

## 2018-04-30 ENCOUNTER — Encounter: Payer: Self-pay | Admitting: Allergy and Immunology

## 2018-04-30 VITALS — BP 134/88 | HR 88 | Temp 98.5°F | Resp 18 | Ht 70.0 in | Wt 238.0 lb

## 2018-04-30 DIAGNOSIS — J310 Chronic rhinitis: Secondary | ICD-10-CM

## 2018-04-30 DIAGNOSIS — K2970 Gastritis, unspecified, without bleeding: Secondary | ICD-10-CM

## 2018-04-30 DIAGNOSIS — L509 Urticaria, unspecified: Secondary | ICD-10-CM | POA: Insufficient documentation

## 2018-04-30 DIAGNOSIS — L500 Allergic urticaria: Secondary | ICD-10-CM

## 2018-04-30 DIAGNOSIS — T7840XD Allergy, unspecified, subsequent encounter: Secondary | ICD-10-CM

## 2018-04-30 DIAGNOSIS — T7840XA Allergy, unspecified, initial encounter: Secondary | ICD-10-CM | POA: Insufficient documentation

## 2018-04-30 MED ORDER — AZELASTINE HCL 0.15 % NA SOLN: 2.0000 | Freq: Two times a day (BID) | NASAL | 5 refills | Status: DC | PRN

## 2018-04-30 NOTE — Patient Instructions (Addendum)
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 NSAIDs and emotional stress commonly exacerbate urticaria but are not the underlying etiology in this case. There are no concomitant symptoms concerning for anaphylaxis or constitutional symptoms worrisome for an underlying malignancy.  To be thorough, we will rule out other potential etiologies with labs. For symptom relief, patient is to take oral antihistamines as directed.  The following labs have been ordered: FCeRI antibody, anti-thyroglobulin antibody, thyroid peroxidase antibody, tryptase, and urea breath test.  The patient will be called with further recommendations after lab results have returned.  Instructions have been discussed and provided for H1/H2 receptor blockade with titration to find lowest effective dose.  A prescription has been provided for levocetirizine, 5 mg daily as needed.  To avoid diminishing benefit with daily use (tachyphylaxis) of second generation antihistamine, consider alternating every few months between fexofenadine (Allegra) and levocetirizine (Xyzal).  Continue montelukast 10 mg daily.    I have encouraged the use of cool wet washcloths or towels after exercise/sweating.. Skin tests to select food allergens were negative today.   Should there be a significant increase or change in symptoms, a journal is to be kept recording any foods eaten, beverages consumed, medications taken within a 6 hour period prior to the onset of symptoms, as well as record activities being performed, and environmental conditions. For any symptoms concerning for anaphylaxis, 911 is to be called immediately.  Chronic rhinitis All seasonal and perennial aeroallergen skin tests were negative today despite a positive histamine control.  A prescription has been provided for azelastine nasal spray, 1-2 sprays per nostril 2 times daily as  needed. Proper nasal spray technique has been discussed and demonstrated.   Nasal saline spray (i.e., Simply Saline) or nasal saline lavage (i.e., NeilMed) is recommended as needed and prior to medicated nasal sprays.   When lab results have returned the patient will be called with further recommendations and follow up instructions.   Urticaria (Hives)  . Levocetirizine (Xyzal) 5 mg twice a day and famotidine (Pepcid) 20 mg twice a day. If no symptoms for 7-14 days then decrease to. . Levocetirizine (Xyzal) 5 mg twice a day and famotidine (Pepcid) 20 mg once a day.  If no symptoms for 7-14 days then decrease to. . Levocetirizine (Xyzal) 5 mg twice a day.  If no symptoms for 7-14 days then decrease to. . Levocetirizine (Xyzal) 5 mg once a day.  May use Benadryl (diphenhydramine) as needed for breakthrough symptoms       If symptoms return, then step up dosage

## 2018-04-30 NOTE — Progress Notes (Signed)
New Patient Note  RE: [name] MRN: [id number] DOB: February 29, 1971 Date of Office Visit: 04/30/2018  Referring provider: Gwenlyn Found, MD Primary care provider: Barbie Banner, MD  Chief Complaint: Allergic Reaction and Urticaria   History of present illness: Garrett Mian" Hazard Chen is a 47 y.o. male seen today in consultation requested by Brett Fairy, MD.  He reports that in early June 2018 he was started on gabapentin in the next morning woke up with nausea, vomiting, fevers, diaphoresis, and generalized urticaria.  This medication was discontinued, however he reports that he has continued to have episodes of hives over the past year and a half.  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  The hives have appeared at different times over his entire body.  The lesions are described as erythematous, raised, and pruritic.  Individual hives last less than 24 hours without leaving residual pigmentation or bruising. He denies concomitant angioedema, cardiopulmonary symptoms and GI symptoms. He has not experienced unexpected weight loss, recurrent fevers or drenching night sweats.  He reports that the hives seem to be exacerbated by hot showers and heat in general, as well as on parts of his body exposed to pressure/compression such as his lower extremity brace.  Otherwise, no specific food, skin care product, detergent, soap, or other environmental triggers have been identified. The symptoms do not seem to correlate with NSAIDs use or emotional stress. [name] has tried to control symptoms with antihistamines and montelukast which have offered moderate temporary relief. Recently, he has been experiencing hives a few times a week. He experiences nasal congestion, rhinorrhea, thick postnasal drainage, and occasional sinus pressure.  No significant seasonal symptom variation  has been noted nor have specific environmental triggers been identified.  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 NSAIDs and emotional stress commonly exacerbate urticaria but are not the underlying etiology in this case. There are no concomitant symptoms concerning for anaphylaxis or constitutional symptoms worrisome for an underlying malignancy.  To be thorough, we will rule out other potential etiologies with labs. For symptom relief, patient is to take oral antihistamines as directed.  The following labs have been ordered: FCeRI antibody, anti-thyroglobulin antibody, thyroid peroxidase antibody, tryptase, and urea breath test.  The patient will be called with further recommendations after lab results have returned.  Instructions have been discussed and provided for H1/H2 receptor blockade with titration to find lowest effective dose.  A prescription has been provided for levocetirizine, 5 mg daily as needed.  To avoid diminishing benefit with daily use (tachyphylaxis) of second generation antihistamine, consider alternating every few months between fexofenadine (Allegra) and levocetirizine (Xyzal).  Continue montelukast 10 mg daily.    I have encouraged the use of cool wet washcloths or towels after exercise/sweating.. Skin tests to select food allergens were negative today.   Should there be a significant increase or change in symptoms, a journal is to be kept recording any foods eaten, beverages consumed, medications taken within a 6 hour period prior to the onset of symptoms, as well as record activities being performed, and environmental conditions. For any symptoms concerning for anaphylaxis, 911 is to be called immediately.  Chronic rhinitis All seasonal and perennial aeroallergen skin tests were negative today despite a positive histamine control.  A  prescription has  been provided for azelastine nasal spray, 1-2 sprays per nostril 2 times daily as needed. Proper nasal spray technique has been discussed and demonstrated.   Nasal saline spray (i.e., Simply Saline) or nasal saline lavage (i.e., NeilMed) is recommended as needed and prior to medicated nasal sprays.   Meds ordered this encounter  Medications  . Azelastine HCl 0.15 % SOLN    Sig: Place 2 sprays into both nostrils 2 (two) times daily as needed.    Dispense:  30 mL    Refill:  5    Diagnostics: Environmental skin testing: Negative despite a positive histamine control. Food allergen skin testing: Negative despite a positive histamine control.    Physical examination: Blood pressure 134/88, pulse 88, temperature 98.5 F (36.9 C), temperature source Oral, resp. rate 18, height 5\' 10"  (1.778 m), weight 238 lb (108 kg), SpO2 97 %.  General: Alert, interactive, in no acute distress. HEENT: TMs pearly gray, turbinates moderately edematous without discharge, post-pharynx erythematous. Neck: Supple without lymphadenopathy. Lungs: Clear to auscultation without wheezing, rhonchi or rales. CV: Normal S1, S2 without murmurs. Abdomen: Nondistended, nontender. Skin: Scattered erythematous urticarial type lesions primarily located Left lower extremity and left lower abdomen , nonvesicular. Extremities:  No clubbing, cyanosis or edema. Neuro:   Grossly intact.  Review of systems:  Review of systems negative except as noted in HPI / PMHx or noted below: Review of Systems  Constitutional: Negative.   HENT: Negative.   Eyes: Negative.   Respiratory: Negative.   Cardiovascular: Negative.   Gastrointestinal: Negative.   Genitourinary: Negative.   Musculoskeletal: Negative.   Skin: Negative.   Neurological: Negative.   Endo/Heme/Allergies: Negative.   Psychiatric/Behavioral: Negative.     Past medical history:  Past Medical History:  Diagnosis Date  . Angio-edema   .  Anxiety   . Headache   . Lyme disease   . [facility] spotted fever   . Urticaria     Past surgical history:  Past Surgical History:  Procedure Laterality Date  . ADENOIDECTOMY    . BACK SURGERY    . HIP SURGERY    . SACROILIAC JOINT FUSION Right 11/15/2017   Procedure: SACROILIAC JOINT FUSION;  Surgeon: Venita Lick, MD;  Location: [facility] OR;  Service: Orthopedics;  Laterality: Right;  120 mins  . TONSILLECTOMY     47 years old    Family history: History reviewed. No pertinent family history.  Social history: Social History   Socioeconomic History  . Marital status: Married    Spouse name: Not on file  . Number of children: Not on file  . Years of education: Not on file  . Highest education level: Not on file  Occupational History  . Not on file  Social Needs  . Financial resource strain: Not on file  . Food insecurity:    Worry: Not on file    Inability: Not on file  . Transportation needs:    Medical: Not on file    Non-medical: Not on file  Tobacco Use  . Smoking status: Current Every Day Smoker    Packs/day: 0.50    Years: 30.00    Pack years: 15.00    Types: Cigarettes  . Smokeless tobacco: Never Used  Substance and Sexual Activity  . Alcohol use: Yes    Comment: stopped 2018  . Drug use: No  . Sexual activity: Never  Lifestyle  . Physical activity:    Days per week: Not on file    Minutes per session:  Not on file  . Stress: Not on file  Relationships  . Social connections:    Talks on phone: Not on file    Gets together: Not on file    Attends religious service: Not on file    Active member of club or organization: Not on file    Attends meetings of clubs or organizations: Not on file    Relationship status: Not on file  . Intimate partner violence:    Fear of current or ex partner: Not on file    Emotionally abused: Not on file    Physically abused: Not on file    Forced sexual activity: Not on file  Other Topics Concern  . Not on file    Social History Narrative  . Not on file   Environmental History: The patient lives in a 46 year old house with hardwood floors throughout, gas heat, and central air.  There is no known mold/water damage in the home.  There are 2 dogs and a cat in the home, the dogs have access to his bedroom.  He smokes half pack of cigarettes per day on average and has done so for the past 29 years.  Allergies as of 04/30/2018      Reactions   Gabapentin Nausea And Vomiting   Sweating, Fever, Hives   Pregabalin Hives, Other (See Comments)   Insomnia Short of breath Mood alter   Sulfa Antibiotics Anaphylaxis      Medication List        Accurate as of 04/30/18  3:32 PM. Always use your most recent med list.          Azelastine HCl 0.15 % Soln Place 2 sprays into both nostrils 2 (two) times daily as needed.   cetirizine 10 MG chewable tablet Commonly known as:  ZYRTEC Chew 10 mg by mouth 2 (two) times daily.   diphenhydrAMINE 25 MG tablet Commonly known as:  BENADRYL Take 1 tablet (25 mg total) by mouth every 6 (six) hours.   famotidine 20 MG tablet Commonly known as:  PEPCID Take 1 tablet (20 mg total) by mouth 2 (two) times daily.   montelukast 10 MG tablet Commonly known as:  SINGULAIR Take 10 mg by mouth at bedtime.   multivitamin with minerals Tabs tablet Take 1 tablet by mouth daily. gummie   ondansetron 4 MG disintegrating tablet Commonly known as:  ZOFRAN-ODT Take 1 tablet (4 mg total) by mouth every 8 (eight) hours as needed for nausea or vomiting.   SUDAFED PE SINUS/ALLERGY PO Take 1 tablet by mouth 2 (two) times daily.   TYLENOL 500 MG tablet Generic drug:  acetaminophen Take 500 mg by mouth as needed.       Known medication allergies: Allergies  Allergen Reactions  . Gabapentin Nausea And Vomiting    Sweating, Fever, Hives  . Pregabalin Hives and Other (See Comments)    Insomnia Short of breath Mood alter  . Sulfa Antibiotics Anaphylaxis    I  appreciate the opportunity to take part in Garrett's care. Please do not hesitate to contact me with questions.  Sincerely,   R. Jorene Guestarter Kapena Hamme, MD

## 2018-04-30 NOTE — Assessment & Plan Note (Addendum)
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 NSAIDs and emotional stress commonly exacerbate urticaria but are not the underlying etiology in this case. There are no concomitant symptoms concerning for anaphylaxis or constitutional symptoms worrisome for an underlying malignancy.  To be thorough, we will rule out other potential etiologies with labs. For symptom relief, patient is to take oral antihistamines as directed.  The following labs have been ordered: FCeRI antibody, anti-thyroglobulin antibody, thyroid peroxidase antibody, tryptase, and urea breath test.  The patient will be called with further recommendations after lab results have returned.  Instructions have been discussed and provided for H1/H2 receptor blockade with titration to find lowest effective dose.  A prescription has been provided for levocetirizine, 5 mg daily as needed.  To avoid diminishing benefit with daily use (tachyphylaxis) of second generation antihistamine, consider alternating every few months between fexofenadine (Allegra) and levocetirizine (Xyzal).  Continue montelukast 10 mg daily.    I have encouraged the use of cool wet washcloths or towels after exercise/sweating.. Skin tests to select food allergens were negative today.   Should there be a significant increase or change in symptoms, a journal is to be kept recording any foods eaten, beverages consumed, medications taken within a 6 hour period prior to the onset of symptoms, as well as record activities being performed, and environmental conditions. For any symptoms concerning for anaphylaxis, 911 is to be called immediately.

## 2018-04-30 NOTE — Assessment & Plan Note (Signed)
All seasonal and perennial aeroallergen skin tests were negative today despite a positive histamine control.  A prescription has been provided for azelastine nasal spray, 1-2 sprays per nostril 2 times daily as needed. Proper nasal spray technique has been discussed and demonstrated.   Nasal saline spray (i.e., Simply Saline) or nasal saline lavage (i.e., NeilMed) is recommended as needed and prior to medicated nasal sprays.

## 2018-07-05 ENCOUNTER — Ambulatory Visit (HOSPITAL_COMMUNITY): Payer: Self-pay | Admitting: Orthopedic Surgery

## 2018-07-16 ENCOUNTER — Ambulatory Visit: Payer: Self-pay | Admitting: Orthopedic Surgery

## 2018-07-16 NOTE — H&P (Signed)
Subjective:   [name] is a 48 year old male The past medical history of lumbar spinal fusion and right SI joint fusion scheduled for a left SI joint fusion on 07/25/18 with Dr[facility]. His main complaint is left-sided low back, buttock, posterior lateral thigh pain That is severe and is affecting his quality-of-life. He has had SI joint injections which have confirmed that this SI joint is the primary pain generator at this time.He is currently taking Tylenol and tramadol as needed for pain. He has crutches to use postoperatively. Patient has obtained consent from PCP.  Patient Active Problem List   Diagnosis Date Noted  . Recurrent urticaria 04/30/2018  . Allergic reaction 04/30/2018  . Chronic rhinitis 04/30/2018  . S/P lumbar fusion 11/15/2017   Past Medical History:  Diagnosis Date  . Angio-edema   . Anxiety   . Headache   . Lyme disease   . 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   . Urticaria     Past Surgical History:  Procedure Laterality Date  . ADENOIDECTOMY    . BACK SURGERY    . HIP SURGERY    . SACROILIAC JOINT FUSION Right 11/15/2017   Procedure: SACROILIAC JOINT FUSION;  Surgeon: Venita LickBrooks, Dahari, MD;  [facility];  Service: Orthopedics;  Laterality: Right;  120 mins  . TONSILLECTOMY     48 years old    Current Outpatient Medications  Medication Sig Dispense Refill Last Dose  . acetaminophen (TYLENOL) 500 MG tablet Take 500 mg by mouth 3 (three) times daily.    Taking  . Azelastine HCl 0.15 % SOLN Place 2 sprays into both nostrils 2 (two) times daily as needed. (Patient not taking: Reported on 07/05/2018) 30 mL 5 Not Taking at Unknown time  . Cetirizine HCl 10 MG CAPS Take 10 mg by mouth 2 (two) times daily.    Taking  . diphenhydrAMINE (BENADRYL) 25 MG tablet Take 1 tablet (25 mg total) by mouth every 6 (six) hours. (Patient not taking: Reported on 07/05/2018) 12 tablet 0 Not Taking at Unknown time  . famotidine (PEPCID) 20 MG tablet Take 1 tablet (20  mg total) by mouth 2 (two) times daily. 10 tablet 0 Taking  . montelukast (SINGULAIR) 10 MG tablet Take 10 mg by mouth at bedtime.   Taking  . ondansetron (ZOFRAN ODT) 4 MG disintegrating tablet Take 1 tablet (4 mg total) by mouth every 8 (eight) hours as needed for nausea or vomiting. (Patient not taking: Reported on 07/05/2018) 20 tablet 0 Not Taking at Unknown time  . triamcinolone (NASACORT ALLERGY 24HR) 55 MCG/ACT AERO nasal inhaler Place 1 spray into the nose daily.      No current facility-administered medications for this visit.    Allergies  Allergen Reactions  . Gabapentin Nausea And Vomiting    Sweating, Fever, Hives  . Pregabalin Hives and Other (See Comments)    Insomnia Short of breath Mood alter  . Sulfa Antibiotics Anaphylaxis    Social History   Tobacco Use  . Smoking status: Current Every Day Smoker    Packs/day: 0.50    Years: 30.00    Pack years: 15.00    Types: Cigarettes  . Smokeless tobacco: Never Used  Substance Use Topics  . Alcohol use: Yes    Comment: stopped 2018    No family history on file.   Objective:  Vitals:  - Ht: 6 ft 07/16/2018 09:23 am - Wt: 242 lbs 07/16/2018 09:23 am - BMI: 32.8 07/16/2018 09:23  am - BP: 155/116 07/16/2018 09:24 am - Pulse: 89 bpm 07/16/2018 09:24 am - T: 97.7 F 07/16/2018 09:24 am - Pain Scale: 8 07/16/2018 09:23 am General: AAOX3, well developed and well nourished, appears uncomfortable Ambulation: antalgic uses no assitive devices Heart: RRR, no rubs, murmers, or gallops. Lungs: CTAB Abdomen: Normal bowel soundsX4, soft, non-tender, no hepatosplenomegaly. Palpation: Patient is tender to palpation over the left SI joint ROM: -Spine: normal ROM, pain elicited with fwd flexion and extension. - Knee: flexion and extension normal and pain free bilaterally. - Ankle: Dorsiflexion, plantarflexion, inversion, eversion normal and pain free.  Dermatomes: Lower extremity sensation to light touch intact  bilaterally  Myotomes: - Hip Flexion: Left 5/5, Right 5/5 - Hip Adduction: Left 5/5, Right 5/5 - Knee Extension: Left 5/5, Right 5/5 - Knee Flexion: Left 5/5, Right 5/5 - Ankle Dorsiflextion: Left 5/5, Right 5/5 - Ankle Eversion: Left 5/5, Right 5/5 - Ankle Plantarflexion: Left 5/5, Right 5/5  Reflexes: - Patella: Left2+, Right 2+ - Achilles: Left2+, Right 2+ - Babinski: Left Ngative, Right Negative - Clonus: Negative  Special Tests: - Straight Leg Raise: Left Negative, Right Negative - Positive Faber's on the left - Positive Gleason's compression test  PV: Extremities warm and well profused. Posterior and dorsalis pedis pulse 2+ bilaterally, No pitting Edema, discoloration, calf tenderness, or palpable cords. Homan's negative bilaterally.  Lumbar MRI: completed on 04/25/18 was reviewed with the patient. I have also reviewed the radiology report. Postsurgical changes at L5-S1 without spinal canal stenosis or neural foraminal stenosis. No comp getting features are noted. Small broad right foraminal disc protrusion at L4-5 but no significant left-sided pathology to explain his gluteal pain.  Assessment:   Somatic dysfunction of left sacroiliac joint M99.04: Segmental and somatic dysfunction of sacral region  [name] is a 48 year old male The past medical history of lumbar spinal fusion and right SI joint fusion scheduled for a left SI joint fusion on 07/25/18 with Dr[facility]. His main complaint is left-sided low back, buttock, posterior lateral thigh pain That is severe and is affecting his quality-of-life. MRI of the lumbar spine Does not show any significant pathology that would be causing his left-sided pain. Additionally, physical exam is positive for left-sided Faber test, positive Gleason tests, tender to palpation over the left SI joint; Marcaine only SI joint injections have given the patient greater than 80% relief indicating that his left SI joint is  his primary pain generator at this time.  Additionally, patient appears anxious and in pain and has not elevated blood pressure which I think is likely due to the anxiety and the pain.  Plan:   1. Prescription for tramadol 50 mg tablets take 1 tablet up to 3 times a day as needed for 9 days. 2. Dr. Shon Baton contacted the patient's PCP and informed them of the elevated blood pressure, patient will be seen in the primary care provider's office this week, but the plan is to move forward with surgery as scheduled. 3. Risks and benefits of surgery were discussed with the patient. These include: Infection, bleeding, death, stroke, paralysis, ongoing or worse pain, need for additional surgery, nonunion, mal-position of the screws. Patient made aware that the goal of surgery is to decrease, (not eliminate), pain and improve quality-of-life. All of patient's questions were invited and answered.   Return to Office Julien Girt, DPT for 5-Spine PT Evaluation at [facility] on 07/16/2018 at 11:00 AM Venita Lick, MD for 5-Surgery at [facility] on 07/25/2018 at 07:30 AM Venita Lick, MD for 5-Physical Therapy Return at [facility] on 08/07/2018 at 02:00 PM

## 2018-07-16 NOTE — H&P (Deleted)
  The note originally documented on this encounter has been moved the the encounter in which it belongs.  

## 2018-07-18 NOTE — Pre-Procedure Instructions (Signed)
Dola Argyle Castello III  07/18/2018      [facility] [place], [place] - [address] [place] [address] Phone: [phone] Fax: [phone]  [id number] - [place],  - [address] AT [facility] [address] [place] [address] Phone: [phone] Fax: [phone]    Your procedure is scheduled on Wed., Feb. 26, 2020 from 7:30AM-9:00AM  Report to [facility] Entrance "A" at 5:30AM  Call this number if you have problems the morning of surgery:  [phone]   Remember:  Do not eat or drink after midnight on Feb. 25th    Take these medicines the morning of surgery with A SIP OF WATER: Acetaminophen (TYLENOL), Cetirizine HCl, Famotidine (PEPCID), and Triamcinolone (NASACORT ALLERGY 24HR)  As of today, stop taking all Other Aspirin Products, Vitamins, Fish oils, and Herbal medications. Also stop all NSAIDS i.e. Advil, Ibuprofen, Motrin, Aleve, Anaprox, Naproxen, BC, Goody Powders, and all Supplements.    Do not wear jewelry.  Do not wear lotions, powders, colognes, or deodorant.  Do not shave 48 hours prior to surgery.  Men may shave face.  Do not bring valuables to the hospital.  [facility] is not responsible for any belongings or valuables.  Contacts, dentures or bridgework may not be worn into surgery.  Leave your suitcase in the car.  After surgery it may be brought to your room.  For patients admitted to the hospital, discharge time will be determined by your treatment team.  Patients discharged the day of surgery will not be allowed to drive home.   Special instructions:   [facility]- Preparing For Surgery  Before surgery, you can play an important role. Because skin is not sterile, your skin needs to be as free of germs as possible. You can reduce the number of germs on your skin by washing with CHG (chlorahexidine gluconate) Soap before surgery.   CHG is an antiseptic cleaner which kills germs and bonds with the skin to continue killing germs even after washing.    Oral Hygiene is also important to reduce your risk of infection.  Remember - BRUSH YOUR TEETH THE MORNING OF SURGERY WITH YOUR REGULAR TOOTHPASTE  Please do not use if you have an allergy to CHG or antibacterial soaps. If your skin becomes reddened/irritated stop using the CHG.  Do not shave (including legs and underarms) for at least 48 hours prior to first CHG shower. It is OK to shave your face.  Please follow these instructions carefully.   1. Shower the NIGHT BEFORE SURGERY and the MORNING OF SURGERY with CHG.   2. If you chose to wash your hair, wash your hair first as usual with your normal shampoo.  3. After you shampoo, rinse your hair and body thoroughly to remove the shampoo.  4. Use CHG as you would any other liquid soap. You can apply CHG directly to the skin and wash gently with a scrungie or a clean washcloth.   5. Apply the CHG Soap to your body ONLY FROM THE NECK DOWN.  Do not use on open wounds or open sores. Avoid contact with your eyes, ears, mouth and genitals (private parts). Wash Face and genitals (private parts)  with your normal soap.  6. Wash thoroughly, paying special attention to the area where your surgery will be performed.  7. Thoroughly rinse your body with warm water from the neck down.  8. DO NOT shower/wash with your normal soap after using and rinsing off the CHG Soap.  9. Pat yourself dry with a CLEAN TOWEL.  10. Wear CLEAN PAJAMAS to bed the night before surgery, wear comfortable clothes the morning of surgery  11. Place CLEAN SHEETS on your bed the night of your first shower and DO NOT SLEEP WITH PETS.  Day of Surgery:  Do not apply any deodorants/lotions.  Please wear clean clothes to the hospital/surgery center.   Remember to brush your teeth WITH YOUR REGULAR TOOTHPASTE.  Please read over the following fact sheets that  you were given. Pain Booklet, Coughing and Deep Breathing, MRSA Information and Surgical Site Infection Prevention

## 2018-07-19 ENCOUNTER — Other Ambulatory Visit: Payer: Self-pay

## 2018-07-19 ENCOUNTER — Encounter (HOSPITAL_COMMUNITY): Payer: Self-pay

## 2018-07-19 ENCOUNTER — Encounter (HOSPITAL_COMMUNITY)
Admission: RE | Admit: 2018-07-19 | Discharge: 2018-07-19 | Disposition: A | Discharge status: Home / Self Care | Payer: Self-pay | Source: Ambulatory Visit | Attending: Orthopedic Surgery | Admitting: Orthopedic Surgery

## 2018-07-19 DIAGNOSIS — Z01812 Encounter for preprocedural laboratory examination: Secondary | ICD-10-CM | POA: Insufficient documentation

## 2018-07-19 LAB — BASIC METABOLIC PANEL
Anion gap: 10 (ref 5–15)
BUN: 9 mg/dL (ref 6–20)
CO2: 22 mmol/L (ref 22–32)
Calcium: 9.4 mg/dL (ref 8.9–10.3)
Chloride: 107 mmol/L (ref 98–111)
Creatinine, Ser: 1.03 mg/dL (ref 0.61–1.24)
GFR calc Af Amer: >60 mL/min (ref >60)
GFR calc non Af Amer: >60 mL/min (ref >60)
Glucose, Bld: 116 mg/dL — ABNORMAL HIGH (ref 70–99)
Potassium: 3.2 mmol/L — ABNORMAL LOW (ref 3.5–5.1)
SODIUM: 139 mmol/L (ref 135–145)

## 2018-07-19 LAB — CBC
HCT: 47 % (ref 39.0–52.0)
Hemoglobin: 15.5 g/dL (ref 13.0–17.0)
MCH: 30.3 pg (ref 26.0–34.0)
MCHC: 33 g/dL (ref 30.0–36.0)
MCV: 92 fL (ref 80.0–100.0)
Platelets: 194 10*3/uL (ref 150–400)
RBC: 5.11 MIL/uL (ref 4.22–5.81)
RDW: 12.1 % (ref 11.5–15.5)
WBC: 8.9 10*3/uL (ref 4.0–10.5)
nRBC: 0 % (ref 0.0–0.2)

## 2018-07-19 LAB — SURGICAL PCR SCREEN
MRSA, PCR: NEGATIVE
Staphylococcus aureus: NEGATIVE

## 2018-07-19 LAB — TYPE AND SCREEN
ABO/RH(D): A POS
ANTIBODY SCREEN: NEGATIVE

## 2018-07-19 NOTE — Progress Notes (Signed)
PCP - Dr. Benedetto Goad Cardiologist - denies  Chest x-ray - 02/03/18 EKG - 02/03/18 Stress Test -denies  ECHO - denies Cardiac Cath - denies  Sleep Study - denies  Aspirin Instructions: Patient instructed to hold all Aspirin, NSAID's, herbal medications, fish oil and vitamins 7 days prior to surgery.   Anesthesia review:   Patient denies shortness of breath, fever, cough and chest pain at PAT appointment   Patient verbalized understanding of instructions that were given to them at the PAT appointment. Patient was also instructed that they will need to review over the PAT instructions again at home before surgery.

## 2018-07-24 NOTE — Anesthesia Preprocedure Evaluation (Addendum)
Anesthesia Evaluation  Patient identified by MRN, date of birth, ID band Patient awake    Reviewed: Allergy & Precautions, NPO status , Patient's Chart, lab work & pertinent test results  History of Anesthesia Complications Negative for: history of anesthetic complications  Airway Mallampati: II  TM Distance: >3 FB Neck ROM: Full    Dental  (+) Dental Advisory Given, Chipped,    Pulmonary Current Smoker,    Pulmonary exam normal breath sounds clear to auscultation       Cardiovascular negative cardio ROS Normal cardiovascular exam Rhythm:Regular Rate:Normal     Neuro/Psych  Headaches,    GI/Hepatic negative GI ROS, Neg liver ROS,   Endo/Other  negative endocrine ROS  Renal/GU negative Renal ROS     Musculoskeletal negative musculoskeletal ROS (+)   Abdominal   Peds  Hematology negative hematology ROS (+)   Anesthesia Other Findings Day of surgery medications reviewed with the patient.  Reproductive/Obstetrics                            Anesthesia Physical Anesthesia Plan  ASA: II  Anesthesia Plan: General   Post-op Pain Management:    Induction: Intravenous  PONV Risk Score and Plan: 2 and Treatment may vary due to age or medical condition, Ondansetron, Dexamethasone and Midazolam  Airway Management Planned: Oral ETT  Additional Equipment:   Intra-op Plan:   Post-operative Plan: Extubation in OR  Informed Consent: I have reviewed the patients History and Physical, chart, labs and discussed the procedure including the risks, benefits and alternatives for the proposed anesthesia with the patient or authorized representative who has indicated his/her understanding and acceptance.     Dental advisory given  Plan Discussed with: CRNA  Anesthesia Plan Comments:        Anesthesia Quick Evaluation

## 2018-07-25 ENCOUNTER — Encounter (HOSPITAL_COMMUNITY): Payer: Self-pay | Admitting: *Deleted

## 2018-07-25 ENCOUNTER — Ambulatory Visit (HOSPITAL_COMMUNITY)
Admission: RE | Admit: 2018-07-25 | Discharge: 2018-07-25 | Disposition: A | Discharge status: Home / Self Care | Payer: No Typology Code available for payment source | Attending: Orthopedic Surgery | Admitting: Orthopedic Surgery

## 2018-07-25 ENCOUNTER — Other Ambulatory Visit: Payer: Self-pay

## 2018-07-25 ENCOUNTER — Encounter (HOSPITAL_COMMUNITY)
Admission: RE | Disposition: A | Discharge status: Home / Self Care | Payer: Self-pay | Source: Home / Self Care | Attending: Orthopedic Surgery

## 2018-07-25 ENCOUNTER — Ambulatory Visit (HOSPITAL_COMMUNITY): Payer: No Typology Code available for payment source | Admitting: Anesthesiology

## 2018-07-25 ENCOUNTER — Ambulatory Visit (HOSPITAL_COMMUNITY): Payer: No Typology Code available for payment source

## 2018-07-25 DIAGNOSIS — Z981 Arthrodesis status: Secondary | ICD-10-CM | POA: Diagnosis not present

## 2018-07-25 DIAGNOSIS — F17210 Nicotine dependence, cigarettes, uncomplicated: Secondary | ICD-10-CM | POA: Diagnosis not present

## 2018-07-25 DIAGNOSIS — Z79899 Other long term (current) drug therapy: Secondary | ICD-10-CM | POA: Insufficient documentation

## 2018-07-25 DIAGNOSIS — Z419 Encounter for procedure for purposes other than remedying health state, unspecified: Secondary | ICD-10-CM

## 2018-07-25 DIAGNOSIS — M9904 Segmental and somatic dysfunction of sacral region: Secondary | ICD-10-CM | POA: Diagnosis present

## 2018-07-25 HISTORY — PX: SACROILIAC JOINT FUSION: SHX6088

## 2018-07-25 SURGERY — SACROILIAC JOINT FUSION
Anesthesia: General | Site: Spine Lumbar | Laterality: Left

## 2018-07-25 MED ORDER — BUPIVACAINE LIPOSOME 1.3 % IJ SUSP
20.0000 mL | INTRAMUSCULAR | Status: AC
  Administered 2018-07-25: 20 mL
  Filled 2018-07-25: qty 20

## 2018-07-25 MED ORDER — FENTANYL CITRATE (PF) 250 MCG/5ML IJ SOLN
INTRAMUSCULAR | Status: DC | PRN
  Administered 2018-07-25 (×2): 50 ug via INTRAVENOUS
  Administered 2018-07-25: 100 ug via INTRAVENOUS
  Administered 2018-07-25: 50 ug via INTRAVENOUS

## 2018-07-25 MED ORDER — ROCURONIUM BROMIDE 50 MG/5ML IV SOSY
PREFILLED_SYRINGE | INTRAVENOUS | Status: AC
  Filled 2018-07-25: qty 5

## 2018-07-25 MED ORDER — LIDOCAINE 2% (20 MG/ML) 5 ML SYRINGE
INTRAMUSCULAR | Status: AC
  Filled 2018-07-25: qty 5

## 2018-07-25 MED ORDER — KETOROLAC TROMETHAMINE 30 MG/ML IJ SOLN
INTRAMUSCULAR | Status: DC | PRN
  Administered 2018-07-25: 30 mg via INTRAVENOUS

## 2018-07-25 MED ORDER — PROPOFOL 10 MG/ML IV BOLUS
INTRAVENOUS | Status: AC
  Filled 2018-07-25: qty 20

## 2018-07-25 MED ORDER — OXYCODONE HCL 5 MG/5ML PO SOLN: 5.0000 mg | Freq: Once | ORAL | Status: DC | PRN

## 2018-07-25 MED ORDER — MIDAZOLAM HCL 2 MG/2ML IJ SOLN
INTRAMUSCULAR | Status: AC
  Filled 2018-07-25: qty 2

## 2018-07-25 MED ORDER — SUCCINYLCHOLINE CHLORIDE 200 MG/10ML IV SOSY
PREFILLED_SYRINGE | INTRAVENOUS | Status: AC
  Filled 2018-07-25: qty 10

## 2018-07-25 MED ORDER — ACETAMINOPHEN 10 MG/ML IV SOLN: 1000.0000 mg | Freq: Once | INTRAVENOUS | Status: DC | PRN

## 2018-07-25 MED ORDER — LACTATED RINGERS IV SOLN
INTRAVENOUS | Status: DC | PRN
  Administered 2018-07-25: 07:00:00 via INTRAVENOUS

## 2018-07-25 MED ORDER — DEXAMETHASONE SODIUM PHOSPHATE 10 MG/ML IJ SOLN
INTRAMUSCULAR | Status: AC
  Filled 2018-07-25: qty 1

## 2018-07-25 MED ORDER — OXYCODONE-ACETAMINOPHEN 10-325 MG PO TABS: 1.0000 | ORAL_TABLET | Freq: Four times a day (QID) | ORAL | 0 refills | Status: AC | PRN

## 2018-07-25 MED ORDER — KETAMINE HCL 50 MG/5ML IJ SOSY
PREFILLED_SYRINGE | INTRAMUSCULAR | Status: AC
  Filled 2018-07-25: qty 5

## 2018-07-25 MED ORDER — PHENYLEPHRINE 40 MCG/ML (10ML) SYRINGE FOR IV PUSH (FOR BLOOD PRESSURE SUPPORT)
PREFILLED_SYRINGE | INTRAVENOUS | Status: AC
  Filled 2018-07-25: qty 10

## 2018-07-25 MED ORDER — BUPIVACAINE-EPINEPHRINE (PF) 0.5% -1:200000 IJ SOLN
INTRAMUSCULAR | Status: DC | PRN
  Administered 2018-07-25: 30 mL

## 2018-07-25 MED ORDER — FENTANYL CITRATE (PF) 100 MCG/2ML IJ SOLN
50.0000 ug | Freq: Once | INTRAMUSCULAR | Status: AC
  Administered 2018-07-25: 50 ug via INTRAVENOUS

## 2018-07-25 MED ORDER — FENTANYL CITRATE (PF) 250 MCG/5ML IJ SOLN
INTRAMUSCULAR | Status: AC
  Filled 2018-07-25: qty 5

## 2018-07-25 MED ORDER — ROCURONIUM BROMIDE 10 MG/ML (PF) SYRINGE
PREFILLED_SYRINGE | INTRAVENOUS | Status: DC | PRN
  Administered 2018-07-25: 70 mg via INTRAVENOUS

## 2018-07-25 MED ORDER — SUGAMMADEX SODIUM 200 MG/2ML IV SOLN
INTRAVENOUS | Status: DC | PRN
  Administered 2018-07-25: 200 mg via INTRAVENOUS

## 2018-07-25 MED ORDER — ONDANSETRON HCL 4 MG/2ML IJ SOLN
INTRAMUSCULAR | Status: AC
  Filled 2018-07-25: qty 2

## 2018-07-25 MED ORDER — KETAMINE HCL 10 MG/ML IJ SOLN
INTRAMUSCULAR | Status: DC | PRN
  Administered 2018-07-25: 40 mg via INTRAVENOUS

## 2018-07-25 MED ORDER — LIDOCAINE 2% (20 MG/ML) 5 ML SYRINGE
INTRAMUSCULAR | Status: DC | PRN
  Administered 2018-07-25: 100 mg via INTRAVENOUS

## 2018-07-25 MED ORDER — DEXAMETHASONE SODIUM PHOSPHATE 10 MG/ML IJ SOLN
INTRAMUSCULAR | Status: DC | PRN
  Administered 2018-07-25: 5 mg via INTRAVENOUS

## 2018-07-25 MED ORDER — 0.9 % SODIUM CHLORIDE (POUR BTL) OPTIME
TOPICAL | Status: DC | PRN
  Administered 2018-07-25: 1000 mL

## 2018-07-25 MED ORDER — EPHEDRINE 5 MG/ML INJ
INTRAVENOUS | Status: AC
  Filled 2018-07-25: qty 10

## 2018-07-25 MED ORDER — ONDANSETRON HCL 4 MG PO TABS: 4.0000 mg | ORAL_TABLET | Freq: Three times a day (TID) | ORAL | 0 refills | Status: DC | PRN

## 2018-07-25 MED ORDER — PROMETHAZINE HCL 25 MG/ML IJ SOLN: 6.2500 mg | INTRAMUSCULAR | Status: DC | PRN

## 2018-07-25 MED ORDER — OXYCODONE HCL 5 MG PO TABS: 5.0000 mg | ORAL_TABLET | Freq: Once | ORAL | Status: DC | PRN

## 2018-07-25 MED ORDER — HYDROMORPHONE HCL 1 MG/ML IJ SOLN: 0.2500 mg | INTRAMUSCULAR | Status: DC | PRN

## 2018-07-25 MED ORDER — BUPIVACAINE-EPINEPHRINE (PF) 0.5% -1:200000 IJ SOLN
INTRAMUSCULAR | Status: AC
  Filled 2018-07-25: qty 30

## 2018-07-25 MED ORDER — FENTANYL CITRATE (PF) 100 MCG/2ML IJ SOLN
INTRAMUSCULAR | Status: AC
  Administered 2018-07-25: 50 ug via INTRAVENOUS
  Filled 2018-07-25: qty 2

## 2018-07-25 MED ORDER — METHOCARBAMOL 500 MG PO TABS: 500.0000 mg | ORAL_TABLET | Freq: Three times a day (TID) | ORAL | 0 refills | Status: AC | PRN

## 2018-07-25 MED ORDER — PROPOFOL 10 MG/ML IV BOLUS
INTRAVENOUS | Status: DC | PRN
  Administered 2018-07-25: 200 mg via INTRAVENOUS

## 2018-07-25 MED ORDER — ONDANSETRON HCL 4 MG/2ML IJ SOLN
INTRAMUSCULAR | Status: DC | PRN
  Administered 2018-07-25: 4 mg via INTRAVENOUS

## 2018-07-25 MED ORDER — MIDAZOLAM HCL 2 MG/2ML IJ SOLN
INTRAMUSCULAR | Status: DC | PRN
  Administered 2018-07-25: 2 mg via INTRAVENOUS

## 2018-07-25 MED ORDER — ALBUTEROL SULFATE HFA 108 (90 BASE) MCG/ACT IN AERS
INHALATION_SPRAY | RESPIRATORY_TRACT | Status: DC | PRN
  Administered 2018-07-25: 4 via RESPIRATORY_TRACT

## 2018-07-25 MED ORDER — CEFAZOLIN SODIUM-DEXTROSE 2-4 GM/100ML-% IV SOLN
2.0000 g | INTRAVENOUS | Status: AC
  Administered 2018-07-25: 2 g via INTRAVENOUS
  Filled 2018-07-25: qty 100

## 2018-07-25 SURGICAL SUPPLY — 57 items
BLADE CLIPPER SURG (BLADE) IMPLANT
BLADE SURG 11 STRL SS (BLADE) ×2 IMPLANT
CLSR STERI-STRIP ANTIMIC 1/2X4 (GAUZE/BANDAGES/DRESSINGS) ×2 IMPLANT
COVER SURGICAL LIGHT HANDLE (MISCELLANEOUS) ×2 IMPLANT
COVER WAND RF STERILE (DRAPES) ×2 IMPLANT
DRAPE C-ARM 42X72 X-RAY (DRAPES) ×2 IMPLANT
DRAPE C-ARMOR (DRAPES) ×2 IMPLANT
DRAPE SHEET LG 3/4 BI-LAMINATE (DRAPES) ×2 IMPLANT
DRAPE SURG 17X23 STRL (DRAPES) ×2 IMPLANT
DRAPE U-SHAPE 47X51 STRL (DRAPES) ×3 IMPLANT
DRSG OPSITE POSTOP 3X4 (GAUZE/BANDAGES/DRESSINGS) IMPLANT
DRSG OPSITE POSTOP 4X6 (GAUZE/BANDAGES/DRESSINGS) ×1 IMPLANT
DURAPREP 26ML APPLICATOR (WOUND CARE) ×2 IMPLANT
ELECT BLADE 4.0 EZ CLEAN MEGAD (MISCELLANEOUS) ×2
ELECT PENCIL ROCKER SW 15FT (MISCELLANEOUS) ×2 IMPLANT
ELECT REM PT RETURN 9FT ADLT (ELECTROSURGICAL) ×2
ELECTRODE BLDE 4.0 EZ CLN MEGD (MISCELLANEOUS) ×1 IMPLANT
ELECTRODE REM PT RTRN 9FT ADLT (ELECTROSURGICAL) ×1 IMPLANT
GLOVE BIOGEL PI IND STRL 8.5 (GLOVE) ×1 IMPLANT
GLOVE BIOGEL PI INDICATOR 8.5 (GLOVE) ×1
GLOVE SS BIOGEL STRL SZ 8.5 (GLOVE) ×1 IMPLANT
GLOVE SUPERSENSE BIOGEL SZ 8.5 (GLOVE) ×1
GOWN STRL REUS W/ TWL LRG LVL3 (GOWN DISPOSABLE) ×1 IMPLANT
GOWN STRL REUS W/TWL 2XL LVL3 (GOWN DISPOSABLE) ×2 IMPLANT
GOWN STRL REUS W/TWL LRG LVL3 (GOWN DISPOSABLE) ×2
IMPL IFUSE 3D 7X35 (Rod) IMPLANT
IMPL IFUSE 7.0X45 (Rod) IMPLANT
IMPLANT IFUSE 3D 7X35 (Rod) ×2 IMPLANT
IMPLANT IFUSE 7.0MMX40MM (Rod) ×2 IMPLANT
IMPLANT IFUSE 7.0X45 (Rod) ×2 IMPLANT
KIT BASIN OR (CUSTOM PROCEDURE TRAY) ×2 IMPLANT
KIT TURNOVER KIT B (KITS) ×2 IMPLANT
NDL HYPO 21X1.5 SAFETY (NEEDLE) IMPLANT
NDL SPNL 22GX3.5 QUINCKE BK (NEEDLE) IMPLANT
NEEDLE 22X1 1/2 (OR ONLY) (NEEDLE) ×2 IMPLANT
NEEDLE HYPO 21X1.5 SAFETY (NEEDLE) ×2 IMPLANT
NEEDLE SPNL 22GX3.5 QUINCKE BK (NEEDLE) ×2 IMPLANT
NS IRRIG 1000ML POUR BTL (IV SOLUTION) ×2 IMPLANT
PACK LAMINECTOMY ORTHO (CUSTOM PROCEDURE TRAY) ×2 IMPLANT
PACK UNIVERSAL I (CUSTOM PROCEDURE TRAY) ×2 IMPLANT
PAD ARMBOARD 7.5X6 YLW CONV (MISCELLANEOUS) ×4 IMPLANT
POSITIONER HEAD PRONE TRACH (MISCELLANEOUS) ×2 IMPLANT
STAPLER VISISTAT 35W (STAPLE) ×2 IMPLANT
STRIP CLOSURE SKIN 1/2X4 (GAUZE/BANDAGES/DRESSINGS) ×1 IMPLANT
SUT MNCRL AB 3-0 PS2 18 (SUTURE) ×1 IMPLANT
SUT VIC AB 1 CT1 18XCR BRD 8 (SUTURE) ×1 IMPLANT
SUT VIC AB 1 CT1 8-18 (SUTURE) ×2
SUT VIC AB 2-0 CT1 18 (SUTURE) ×2 IMPLANT
SYR BULB IRRIGATION 50ML (SYRINGE) ×2 IMPLANT
SYR CONTROL 10ML LL (SYRINGE) ×2 IMPLANT
SYRINGE 20CC LL (MISCELLANEOUS) ×1 IMPLANT
SYS SPNL FX3ANG 40X7X (Rod) ×1 IMPLANT
SYSTEM SPNL FX3ANG 40X7X (Rod) IMPLANT
TOWEL OR 17X24 6PK STRL BLUE (TOWEL DISPOSABLE) ×2 IMPLANT
TOWEL OR 17X26 10 PK STRL BLUE (TOWEL DISPOSABLE) ×2 IMPLANT
WATER STERILE IRR 1000ML POUR (IV SOLUTION) ×2 IMPLANT
YANKAUER SUCT BULB TIP NO VENT (SUCTIONS) ×2 IMPLANT

## 2018-07-25 NOTE — Transfer of Care (Signed)
Immediate Anesthesia Transfer of Care Note  Patient: [name]  Procedure(s) Performed: SACROILIAC JOINT FUSION (Left Spine Lumbar)  Patient Location: PACU  Anesthesia Type:General  Level of Consciousness: awake, alert , oriented and patient cooperative  Airway & Oxygen Therapy: Patient Spontanous Breathing  Post-op Assessment: Report given to RN, Post -op Vital signs reviewed and stable and Patient moving all extremities X 4  Post vital signs: Reviewed and stable  Last Vitals:  Vitals Value Taken Time  BP 146/86 07/25/2018  9:11 AM  Temp    Pulse 97 07/25/2018  9:12 AM  Resp 11 07/25/2018  9:12 AM  SpO2 92 % 07/25/2018  9:12 AM  Vitals shown include unvalidated device data.  Last Pain:  Vitals:   07/25/18 0648  TempSrc:   PainSc: 5       Patients Stated Pain Goal: 3 (07/25/18 7948)  Complications: No apparent anesthesia complications

## 2018-07-25 NOTE — Anesthesia Procedure Notes (Signed)
Procedure Name: Intubation Date/Time: 07/25/2018 7:50 AM Performed by: Julieta Bellini, CRNA Pre-anesthesia Checklist: Patient identified, Emergency Drugs available and Suction available Patient Re-evaluated:Patient Re-evaluated prior to induction Oxygen Delivery Method: Circle system utilized Preoxygenation: Pre-oxygenation with 100% oxygen Induction Type: IV induction Ventilation: Mask ventilation without difficulty Laryngoscope Size: Mac and 4 Grade View: Grade I Tube type: Oral Tube size: 7.5 mm Number of attempts: 1 Airway Equipment and Method: Stylet Placement Confirmation: ETT inserted through vocal cords under direct vision,  positive ETCO2 and breath sounds checked- equal and bilateral Secured at: 24 cm Tube secured with: Tape Dental Injury: Teeth and Oropharynx as per pre-operative assessment

## 2018-07-25 NOTE — Brief Op Note (Signed)
07/25/2018  8:54 AM  PATIENT:  [name]  48 y.o. male  PRE-OPERATIVE DIAGNOSIS:  Left sacroiliac dysfunction  POST-OPERATIVE DIAGNOSIS:  Left sacroiliac dysfunction  PROCEDURE:  Procedure(s) with comments: SACROILIAC JOINT FUSION (Left) - SACROILIAC JOINT FUSION  SURGEON:  Surgeon(s) and Role:    Venita Lick, MD - Primary  PHYSICIAN ASSISTANT:   ASSISTANTS: Amanda Ward, PA  ANESTHESIA:   general  EBL:  30 mL   BLOOD ADMINISTERED:none  DRAINS: none   LOCAL MEDICATIONS USED:  MARCAINE    and OTHER exparel  SPECIMEN:  No Specimen  DISPOSITION OF SPECIMEN:  N/A  COUNTS:  YES  TOURNIQUET:  * No tourniquets in log *  DICTATION: .Dragon Dictation  PLAN OF CARE: Discharge to home after PACU  PATIENT DISPOSITION:  PACU - hemodynamically stable.

## 2018-07-25 NOTE — Op Note (Signed)
Operative report  Preoperative diagnosis: Left sacroiliac dysfunction  Postoperative diagnosis: Same  Operative procedure: Left sacroiliac fusion  Implants: I fuse 3D implants.  Top: 7.0 x 45 mm.  Middle: 7.0 x 40 mm.  Bottom: 7.0 x 35 mm.  First Assistant: Glynis Smiles, PA  Conditions: Stable  Complications: None  Anesthesia: General  Indications: This is a very pleasant 48 year old gentleman who had a previous right SI fusion and did exceptionally well.  The patient is continued to have left sacroiliac dysfunction and presents now for a left SI fusion.  Patient had a prior L5-S1 fusion and now has adjacent SI joint pain.  Emcyt conservative management had failed to alleviate his symptoms and so he elected to proceed with surgery.  Operative report: Patient was brought the operating room placed by the operating room table.  After successful induction of general anesthesia and endotracheal ovation teds SCDs were applied and he was turned prone onto a chest and pelvic roll all bony prominences well-padded and the left sacroiliac region was prepped and draped in a standard fashion.  Timeout was taken to confirm patient procedure and all other important data.  Using lateral fluoroscopy I marked out the anatomical borders of the sacroiliac joint.  I filtrated the area with quarter percent Marcaine with epinephrine.  A guidepin was then percutaneously advanced to the lateral aspect of the iliac wing and I confirmed satisfactory position in the lateral plane.  I then advanced into the iliac wing.  Then using the inlet and outlet views I advanced the guidepin across the SI joint and into the sacrum.  I made sure to remain lateral to the foramen and within the substance of the sacrum.  There was no evidence of anterior or posterior breach or foraminal compromise.  Once the first guidepin was in place I then used a targeting device to place the second and third pin.  This was all done in a similar  fashion.  Using lateral fluoroscopy to confirm starting position and then the inlet and outlet views to confirm trajectory as I advanced across the sacroiliac joint.  Once all 3 pins were across I then made a single incision connecting the stab incision sites and then placed the targeting device over the top pin.  I measured and then broached across the iliac wing and across the sacroiliac joint.  I then placed the appropriate I fuse implant across the sacroiliac joint.  I again used the inlet and outlet views to confirm trajectory and position.  Once the first pin was in place I repeated the exact same procedure for the second and third I fuse device.  Once all 3 implants were in place final x-rays were taken which demonstrated satisfactory position in all 3 planes.  The wound was then copiously irrigated with normal saline and closed in a layered fashion with erupted #1 Vicryl suture, 2-0 Vicryl suture, 3-0 Monocryl stitch.  I then injected a generous (approximately 34 cc) amount of half percent Marcaine with Exparel into the periosteum and into the soft tissue for postoperative analgesia.  Patient was also given Toradol.  Patient was ultimately extubated transfer the PACU that incident after sterile dry dressing was applied to the wound.  Will be discharged home either today if he clears PT or first thing in the morning.  Patient was ultimately extubated transferred to the

## 2018-07-25 NOTE — H&P (Signed)
Addendum history and physical  No change in patient's clinical exam from 07/16/2017.  Continues to have significant left SI joint pain with positive provocative testing.  I have reviewed the procedure with the patient as well as the risks and benefits and all of his questions were addressed.  Plan on moving forward with a left SI fusion.

## 2018-07-25 NOTE — Anesthesia Postprocedure Evaluation (Signed)
Anesthesia Post Note  Patient: [name]  Procedure(s) Performed: SACROILIAC JOINT FUSION (Left Spine Lumbar)     Patient location during evaluation: PACU Anesthesia Type: General Level of consciousness: awake and alert Pain management: pain level controlled Vital Signs Assessment: post-procedure vital signs reviewed and stable Respiratory status: spontaneous breathing, nonlabored ventilation and respiratory function stable Cardiovascular status: blood pressure returned to baseline and stable Postop Assessment: no apparent nausea or vomiting Anesthetic complications: no    Last Vitals:  Vitals:   07/25/18 0911 07/25/18 0926  BP: (!) 146/86 125/86  Pulse: 99 93  Resp: 12 17  Temp: 36.9 C   SpO2: 93% 97%    Last Pain:  Vitals:   07/25/18 0911  TempSrc:   PainSc: 0-No pain                 Kaylyn Layer

## 2018-07-25 NOTE — Discharge Instructions (Signed)
SI Fusion, Care After °This sheet gives you information about how to care for yourself after your procedure. Your health care provider may also give you more specific instructions. If you have problems or questions, contact your health care provider. °What can I expect after the procedure? °After the procedure, it is common to have: °· Some pain around your incision area. °· Muscle tightening (spasms) across the back. °  °Follow these instructions at home: °Incision care °· Follow instructions from your health care provider about how to take care of your incision area. Make sure you: °? Wash your hands with soap and water before and after you apply medicine to the area or change your bandage (dressing). If soap and water are not available, use hand sanitizer. °? Change your dressing as told by your health care provider. °? Leave stitches (sutures), skin glue, or adhesive strips in place. These skin closures may need to stay in place for 2 weeks or longer. If adhesive strip edges start to loosen and curl up, you may trim the loose edges. Do not remove adhesive strips completely unless your health care provider tells you to do that. °·  °· Check your incision area every day for signs of infection. Check for: °? More redness, swelling, or pain. °? More fluid or blood. °? Warmth. °? Pus or a bad smell. °Medicines °· Take over-the-counter and prescription medicines only as told by your health care provider. °· If you were prescribed an antibiotic medicine, use it as told by your health care provider. Do not stop using the antibiotic even if you start to feel better. °· Pain medications have been electronically prescribed. °Bathing °· Do not take baths, swim, or use a hot tub for 6 weeks, or until your incision has healed completely. °· If your health care provider approves, you may take showers after your dressing has been removed. °· Ok to shower in five (5) days °Activity °· Return to your normal activities as told by  your health care provider. Ask your health care provider what activities are safe for you. °· Avoid bending or twisting at your waist. Always bend at your knees. °· Do not sit for more than 20-30 minutes at a time. Lie down or walk between periods of sitting. °· Do not lift anything that is heavier than 10 lb (4.5 kg) or the limit that your health care provider tells you, until he or she says that it is safe. °· Do not drive for 2 weeks after your procedure or for as long as your health care provider tells you. °·  °· Do not drive or use heavy machinery while taking prescription pain medicine. °· DO NOT BEAR WEIGHT ON THE LEFT LEG °General instructions °· To prevent or treat constipation while you are taking prescription pain medicine, your health care provider may recommend that you: °? Drink enough fluid to keep your urine clear or pale yellow. °? Take over-the-counter or prescription medicines. °? Eat foods that are high in fiber, such as fresh fruits and vegetables, whole grains, and beans. °? Limit foods that are high in fat and processed sugars, such as fried and sweet foods. °· Do breathing exercises as told. °· Keep all follow-up visits as told by your health care provider. This is important. °Contact a health care provider if: °· You have more redness, swelling, or pain around your incision area. °· Your incision feels warm to the touch. °· You are not able to return to activities or   do exercises as told by your health care provider. °Get help right away if: °· You have: °? More fluid or blood coming from your incision area. °? Pus or a bad smell coming from your incision area. °? Chills or a fever. °? Episodes of dizziness or fainting while standing. °· You develop a rash. °· You develop shortness of breath or you have difficulty breathing. °· You cannot control when you urinate or have a bowel movement. °· You become weak. °· You are not able to use your legs. °Summary °· After the procedure, it is common  to have some pain around your incision area. You may also have muscle tightening (spasms) across the back. °· Follow instructions from your health care provider about how to care for your incision. °· Do not lift anything that is heavier than 10 lb (4.5 kg) or the limit that your health care provider tells you, until he or she says that it is safe. °· Contact your health care provider if you have more redness, swelling, or pain around your incision area or if your incision feels warm to the touch. These can be signs of infection. °This information is not intended to replace advice given to you by your health care provider. Make sure you discuss any questions you have with your health care provider. ° °

## 2018-07-26 ENCOUNTER — Emergency Department (HOSPITAL_COMMUNITY)
Admission: EM | Admit: 2018-07-26 | Discharge: 2018-07-27 | Disposition: A | Discharge status: Home / Self Care | Payer: No Typology Code available for payment source | Attending: Emergency Medicine | Admitting: Emergency Medicine

## 2018-07-26 ENCOUNTER — Encounter (HOSPITAL_COMMUNITY): Payer: Self-pay | Admitting: Orthopedic Surgery

## 2018-07-26 ENCOUNTER — Other Ambulatory Visit: Payer: Self-pay

## 2018-07-26 DIAGNOSIS — F17210 Nicotine dependence, cigarettes, uncomplicated: Secondary | ICD-10-CM | POA: Insufficient documentation

## 2018-07-26 DIAGNOSIS — H1133 Conjunctival hemorrhage, bilateral: Secondary | ICD-10-CM

## 2018-07-26 DIAGNOSIS — Z79899 Other long term (current) drug therapy: Secondary | ICD-10-CM | POA: Insufficient documentation

## 2018-07-26 DIAGNOSIS — H579 Unspecified disorder of eye and adnexa: Secondary | ICD-10-CM | POA: Diagnosis present

## 2018-07-26 NOTE — ED Triage Notes (Signed)
Pt here after having pelvic surgery yesterday.  Stated after surgery he had bilateral hemorrhages to capillaries in the eye. Today he woke up seeing a black floating dot that resolved quickly.  Ortho office sent pt to ED to r/o stroke post surgery.  No neuro symptoms at this time.  Van Neg.  Discussed case with MD P. Messick who did not give any new orders.  Visual acuity done in triage.

## 2018-07-26 NOTE — ED Notes (Signed)
Visual Acuity R 20/20 L 20/25 Does not wear glasses or contacts

## 2018-07-27 MED ORDER — KETOROLAC TROMETHAMINE 0.5 % OP SOLN
1.0000 [drp] | Freq: Four times a day (QID) | OPHTHALMIC | Status: DC
  Administered 2018-07-27: 1 [drp] via OPHTHALMIC
  Filled 2018-07-27: qty 5

## 2018-07-27 NOTE — ED Provider Notes (Signed)
[facility] EMERGENCY DEPARTMENT Provider Note   CSN: [id number] Arrival date & time: 07/26/18  2125    History   Chief Complaint Chief Complaint  Patient presents with  . Post-op Problem  . Eye Problem    HPI [name] is a 48 y.o. male.     Patient presents to the emergency department for evaluation of eye problems.  Patient had sacroiliac surgery yesterday.  He reports that when he woke up from anesthesia he had bilateral subconjunctival hemorrhages.  This morning when he woke up he had a floater noted out of the right corner of his eye that lasted about 5 minutes and then resolved.  He has been experiencing tearing, drainage and irritation from the eyes.  He also notices bruising across his forehead.  He called his orthopedic doctor's office today and was told to come to the ER for evaluation.     Past Medical History:  Diagnosis Date  . Angio-edema   . Anxiety   . Headache   . Lyme disease   . [facility] spotted fever   . Urticaria     Patient Active Problem List   Diagnosis Date Noted  . Recurrent urticaria 04/30/2018  . Allergic reaction 04/30/2018  . Chronic rhinitis 04/30/2018  . S/P lumbar fusion 11/15/2017    Past Surgical History:  Procedure Laterality Date  . ADENOIDECTOMY    . BACK SURGERY    . HIP SURGERY    . SACROILIAC JOINT FUSION Right 11/15/2017   Procedure: SACROILIAC JOINT FUSION;  Surgeon: Venita Lick, MD;  Location: [facility] OR;  Service: Orthopedics;  Laterality: Right;  120 mins  . SACROILIAC JOINT FUSION Left 07/25/2018   Procedure: SACROILIAC JOINT FUSION;  Surgeon: Venita Lick, MD;  Location: [facility] OR;  Service: Orthopedics;  Laterality: Left;  SACROILIAC JOINT FUSION  . TONSILLECTOMY     48 years old        Home Medications    Prior to Admission medications   Medication Sig Start Date End Date Taking? Authorizing Provider  Cetirizine HCl 10 MG CAPS Take 10 mg by mouth 2 (two) times daily.      [provider]  famotidine (PEPCID) 20 MG tablet Take 1 tablet (20 mg total) by mouth 2 (two) times daily. 02/14/18   Azalia Bilis, MD  levocetirizine (XYZAL) 5 MG tablet Take 5 mg by mouth 2 (two) times daily.    [provider]  methocarbamol (ROBAXIN) 500 MG tablet Take 1 tablet (500 mg total) by mouth every 8 (eight) hours as needed for up to 5 days for muscle spasms. 07/25/18 07/30/18  Venita Lick, MD  montelukast (SINGULAIR) 10 MG tablet Take 10 mg by mouth at bedtime.    [provider]  ondansetron (ZOFRAN) 4 MG tablet Take 1 tablet (4 mg total) by mouth every 8 (eight) hours as needed for nausea or vomiting. 07/25/18   Venita Lick, MD  oxyCODONE-acetaminophen (PERCOCET) 10-325 MG tablet Take 1 tablet by mouth every 6 (six) hours as needed for up to 5 days for pain. 07/25/18 07/30/18  Venita Lick, MD  triamcinolone (NASACORT ALLERGY 24HR) 55 MCG/ACT AERO nasal inhaler Place 1 spray into the nose daily.    [provider]    Family History No family history on file.  Social History Social History   Tobacco Use  . Smoking status: Current Every Day Smoker    Packs/day: 0.50    Years: 30.00    Pack years:  15.00    Types: Cigarettes  . Smokeless tobacco: Never Used  Substance Use Topics  . Alcohol use: Yes    Comment: stopped 2018  . Drug use: No     Allergies   Pregabalin; Sulfa antibiotics; and Gabapentin   Review of Systems Review of Systems  Eyes: Positive for redness.  Musculoskeletal: Positive for back pain.  All other systems reviewed and are negative.    Physical Exam Updated Vital Signs BP (!) 161/100   Pulse (!) 109   Temp 98.9 F (37.2 C)   Resp 19   SpO2 100%   Physical Exam Constitutional:      Appearance: Normal appearance.  HENT:     Head: Normocephalic.  Eyes:     General: Lids are normal. Vision grossly intact. Gaze aligned appropriately. No visual field deficit.    Conjunctiva/sclera:     Right eye:  Right conjunctiva is not injected. Hemorrhage present. No exudate.    Left eye: Left conjunctiva is not injected. Hemorrhage present. No exudate. Musculoskeletal: Normal range of motion.  Skin:    General: Skin is warm and dry.  Neurological:     General: No focal deficit present.     Mental Status: He is alert and oriented to person, place, and time.     Cranial Nerves: No cranial nerve deficit.     Sensory: No sensory deficit.     Motor: No weakness.  Psychiatric:        Mood and Affect: Mood normal.      ED Treatments / Results  Labs (all labs ordered are listed, but only abnormal results are displayed) Labs Reviewed - No data to display  EKG None  Radiology Dg Sacrum/coccyx  Result Date: 07/25/2018 CLINICAL DATA:  Intraoperative imaging for left SI joint fusion. EXAM: SACRUM AND COCCYX - 2+ VIEW; DG C-ARM 61-120 MIN COMPARISON:  CT abdomen and pelvis 02/14/2018. FINDINGS: Three fluoroscopic intraoperative spot views are provided. Images demonstrate fusion hardware across the left SI joint. There is partial visualization of fusion hardware across the right SI joint. The patient is status post anterior L5-S1 fusion. IMPRESSION: Intraoperative imaging for left SI joint fusion.  No acute finding. Electronically Signed   By: Drusilla Kanner M.D.   On: 07/25/2018 11:44   Dg C-arm 1-60 Min  Result Date: 07/25/2018 CLINICAL DATA:  Intraoperative imaging for left SI joint fusion. EXAM: SACRUM AND COCCYX - 2+ VIEW; DG C-ARM 61-120 MIN COMPARISON:  CT abdomen and pelvis 02/14/2018. FINDINGS: Three fluoroscopic intraoperative spot views are provided. Images demonstrate fusion hardware across the left SI joint. There is partial visualization of fusion hardware across the right SI joint. The patient is status post anterior L5-S1 fusion. IMPRESSION: Intraoperative imaging for left SI joint fusion.  No acute finding. Electronically Signed   By: Drusilla Kanner M.D.   On: 07/25/2018 11:44     Procedures Procedures (including critical care time)  Medications Ordered in ED Medications  ketorolac (ACULAR) 0.5 % ophthalmic solution 1 drop (has no administration in time range)     Initial Impression / Assessment and Plan / ED Course  I have reviewed the triage vital signs and the nursing notes.  Pertinent labs & imaging results that were available during my care of the patient were reviewed by me and considered in my medical decision making (see chart for details).        Patient presents to the emergency department for evaluation of eye problems.  Patient woke up from  surgery yesterday with large bilateral subconjunctival hemorrhages.  Patient had sacroiliac surgery.  He was prone for this surgery.  He is also experiencing some bruising and tenderness to the forehead.  The symptoms are consistent with the prone position during surgery.  His visual acuity is normal here.  He has not noticed any vision changes.  He did have a floater when he woke up this morning but it only lasted for 5 minutes.  I do not suspect retinal detachment or other significant eye abnormality.  He was sent here to rule out stroke.  There is nothing to indicate stroke at this time.  Patient reassured, treat symptomatically.  Final Clinical Impressions(s) / ED Diagnoses   Final diagnoses:  Subconjunctival hemorrhage of both eyes    ED Discharge Orders    None       Erman Thum, Canary Brim, MD 07/27/18 [phone]

## 2018-08-29 IMAGING — DX DG CHEST 2V
2 series · 2 of 2 positions shown · non-contrast
Comparison: Chest x-ray of 11/28/2016

CLINICAL DATA: Chest pain, diffuse body aches, fever and nausea
over the last month, history of tick bite several weeks ago

EXAM:
CHEST  2 VIEW

[w chest pa]
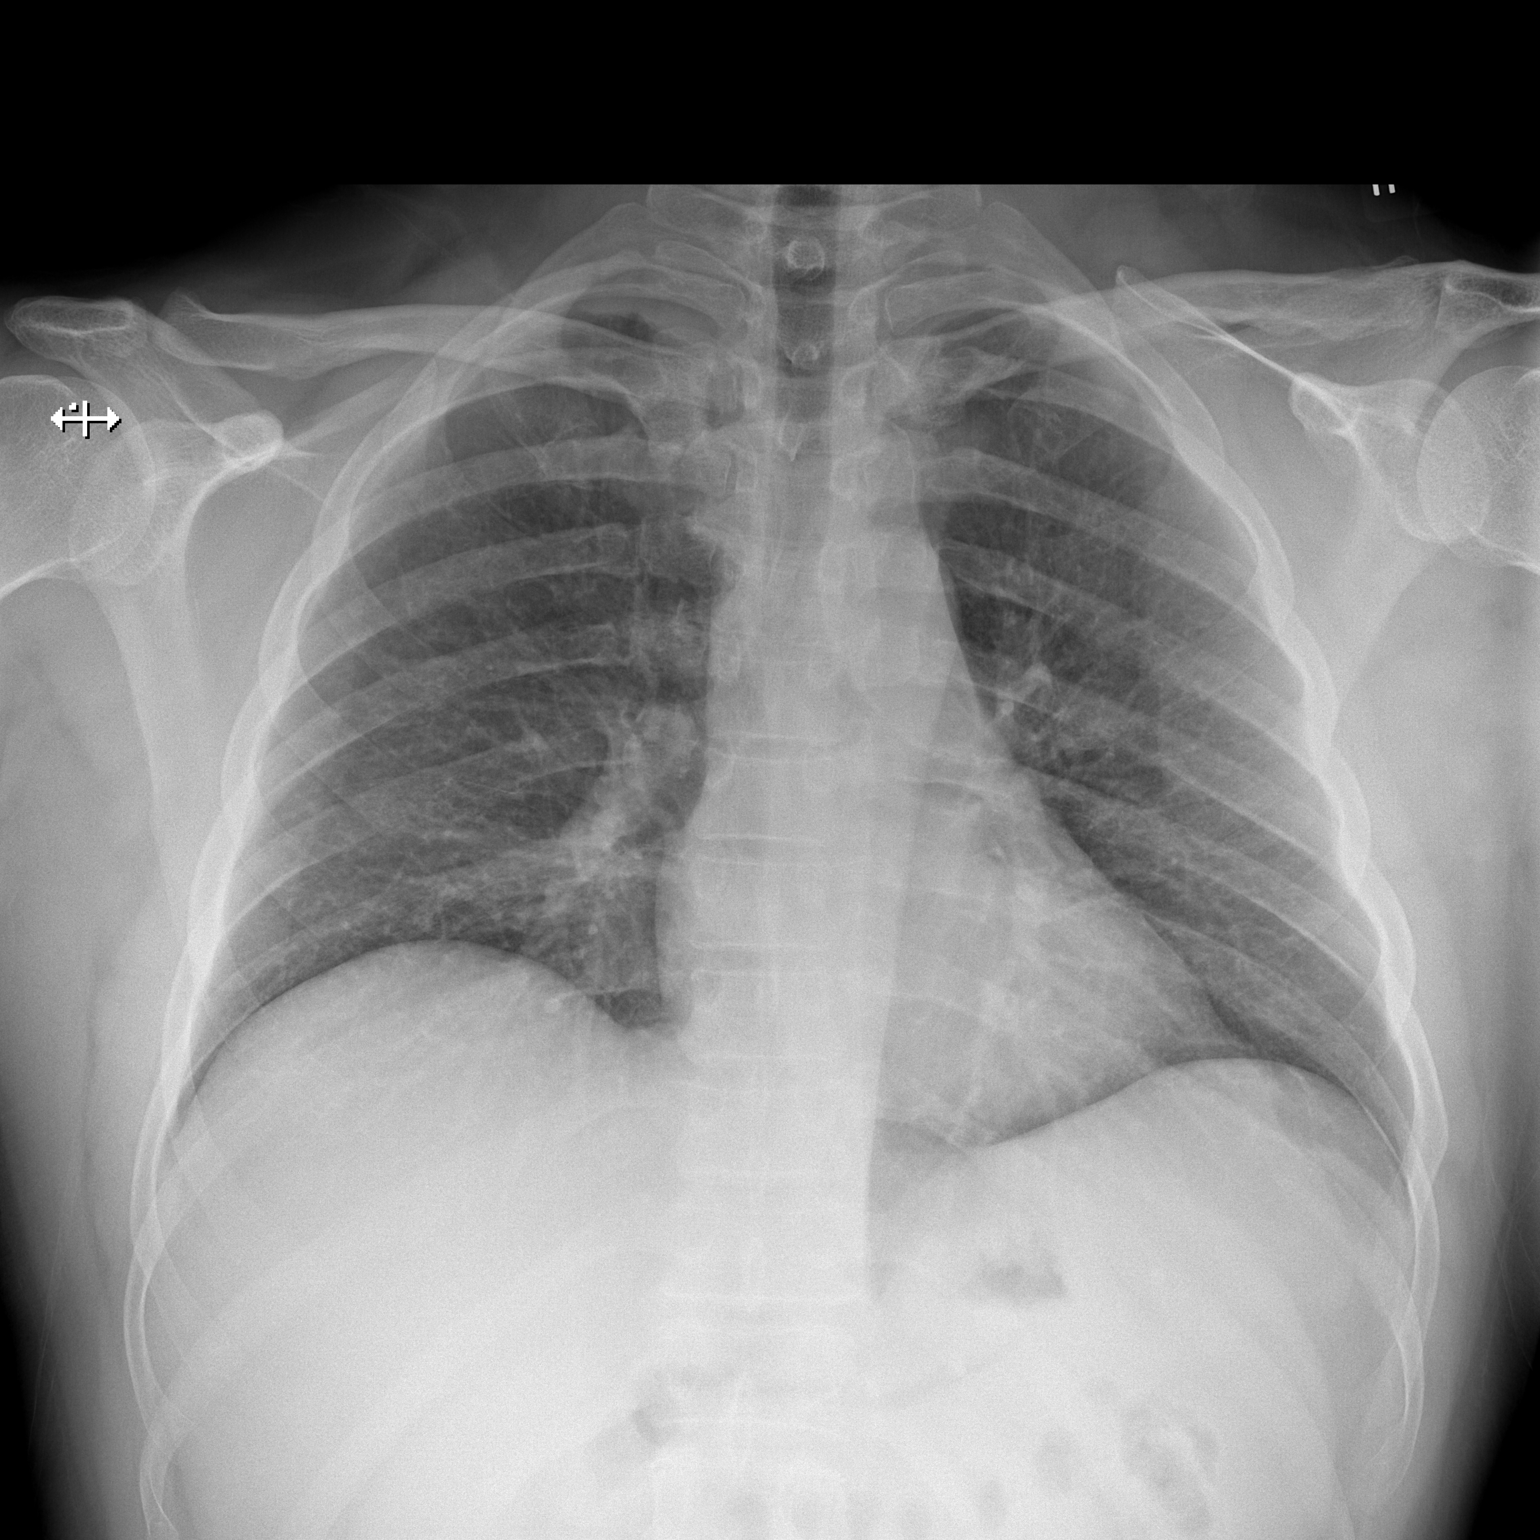

[w chest lat]
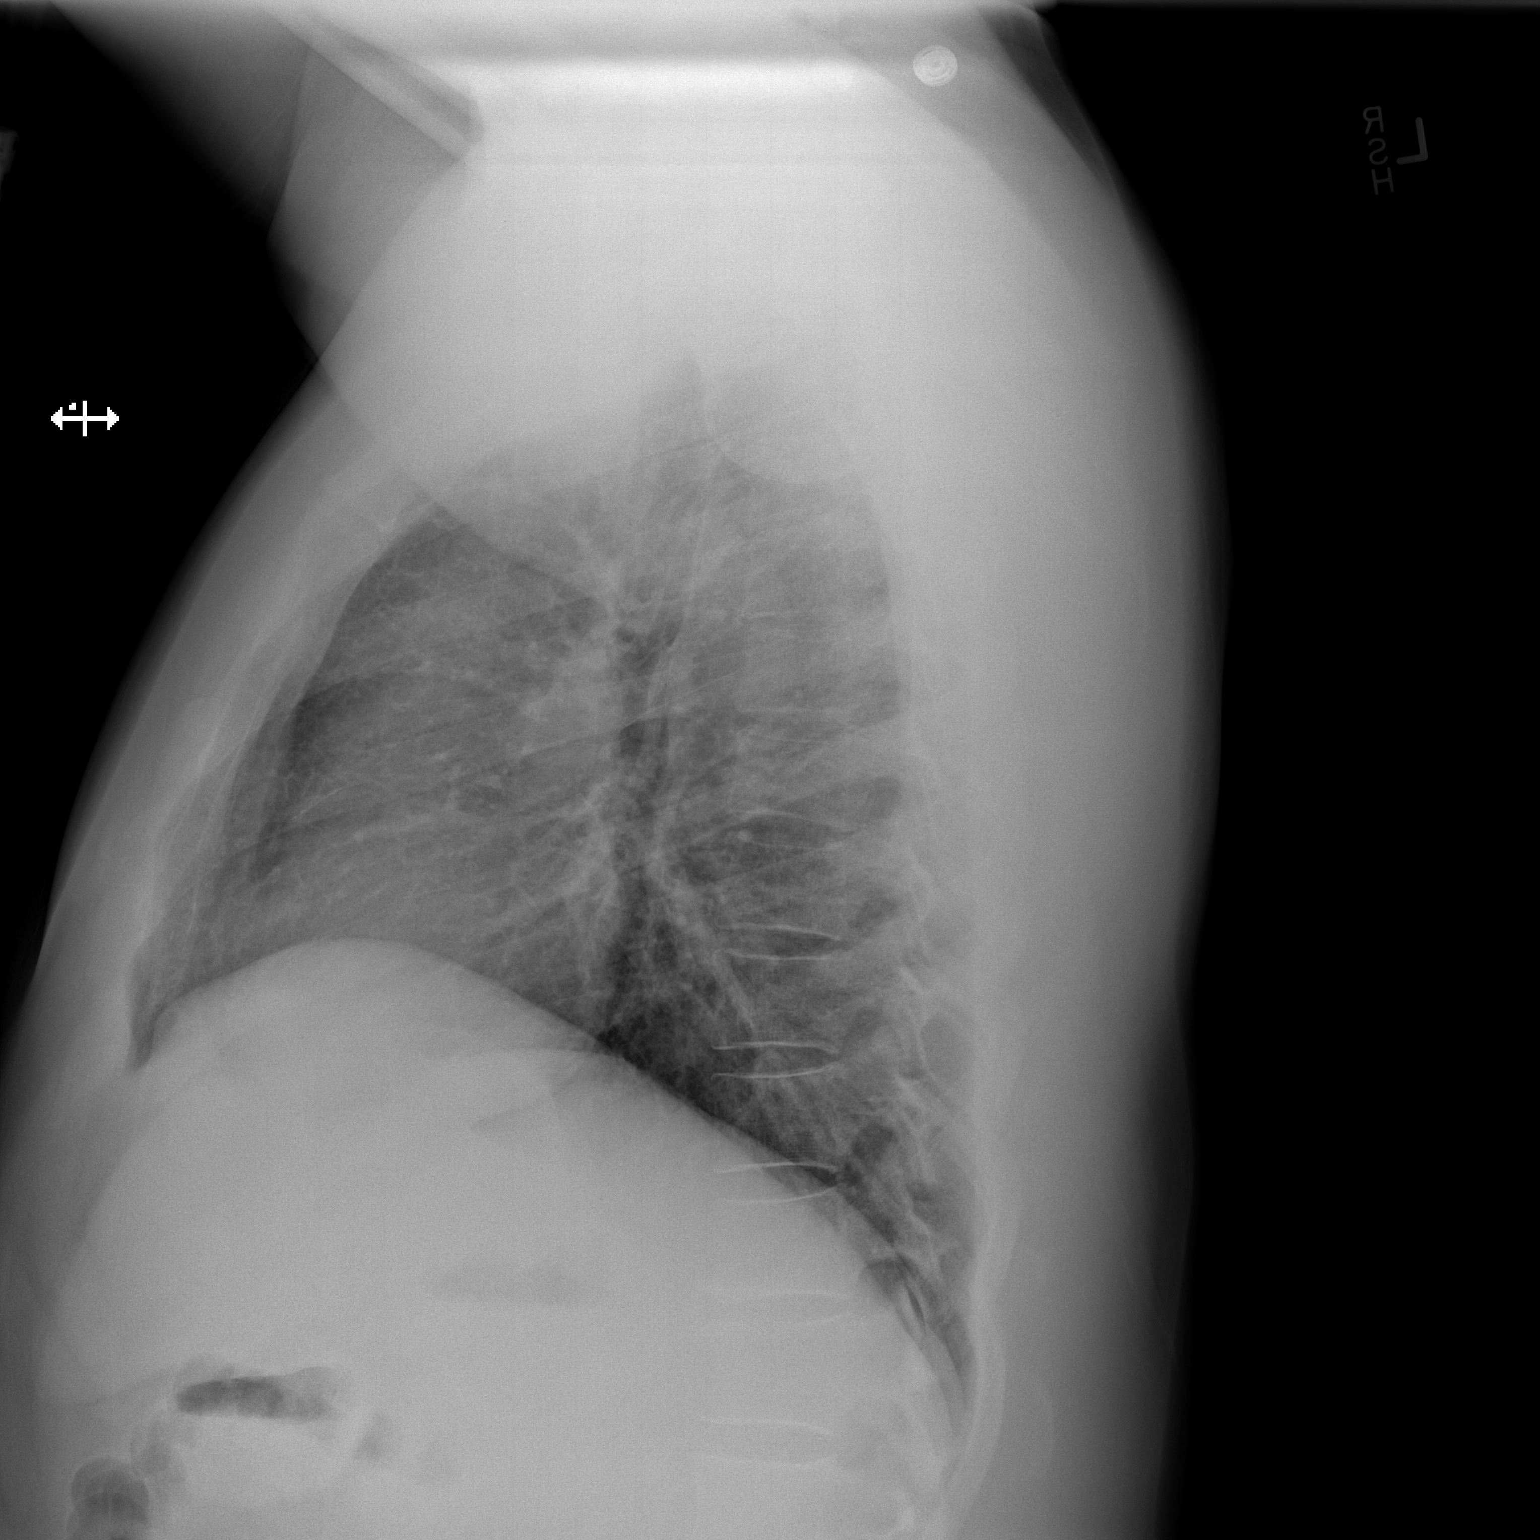

[2 of 2 positions shown; findings below may reference images not displayed]

FINDINGS: No active infiltrate or effusion is seen. Mediastinal and hilar
contours are unremarkable. The heart is within normal limits in
size. No bony abnormality is seen.
IMPRESSION: No active cardiopulmonary disease.

## 2018-11-11 ENCOUNTER — Encounter (HOSPITAL_COMMUNITY): Payer: Self-pay | Admitting: *Deleted

## 2018-11-11 ENCOUNTER — Other Ambulatory Visit: Payer: Self-pay

## 2018-11-11 ENCOUNTER — Emergency Department (HOSPITAL_COMMUNITY)
Admission: EM | Admit: 2018-11-11 | Discharge: 2018-11-11 | Disposition: A | Discharge status: Home / Self Care | Payer: Self-pay | Attending: Emergency Medicine | Admitting: Emergency Medicine

## 2018-11-11 DIAGNOSIS — L03113 Cellulitis of right upper limb: Secondary | ICD-10-CM | POA: Insufficient documentation

## 2018-11-11 DIAGNOSIS — Z79899 Other long term (current) drug therapy: Secondary | ICD-10-CM | POA: Insufficient documentation

## 2018-11-11 DIAGNOSIS — F17210 Nicotine dependence, cigarettes, uncomplicated: Secondary | ICD-10-CM | POA: Insufficient documentation

## 2018-11-11 MED ORDER — DOXYCYCLINE HYCLATE 100 MG PO CAPS: 100.0000 mg | ORAL_CAPSULE | Freq: Two times a day (BID) | ORAL | 0 refills | Status: DC

## 2018-11-11 MED ORDER — MUPIROCIN CALCIUM 2 % NA OINT: TOPICAL_OINTMENT | NASAL | 0 refills | Status: DC

## 2018-11-11 NOTE — ED Provider Notes (Signed)
[name] Provider Note   CSN: [id number] Arrival date & time: 11/11/18  1558    History   Chief Complaint Chief Complaint  Patient presents with  . Insect Bite    HPI Garrett FisherJames R Yonkers III is a 48 y.o. male.     Patient is a 48 year old gentleman with past medical history of Lyme's disease who presents to the emergency department for "spider bite".  Patient reports that 3 or 4 days ago he noticed that he had a spot on his posterior right upper arm.  He denies seeing any insect or feeling any bite but feels that this is a spider bite.  Reports that he has been cleaning it with alcohol daily but it seems to be getting worse.  Denies any fever, chills, body aches.  It is tender and itchy and he also took some Benadryl and Pepcid.     Past Medical History:  Diagnosis Date  . Angio-edema   . Anxiety   . Headache   . Lyme disease   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   . Urticaria     Patient Active Problem List   Diagnosis Date Noted  . Recurrent urticaria 04/30/2018  . Allergic reaction 04/30/2018  . Chronic rhinitis 04/30/2018  . S/P lumbar fusion 11/15/2017    Past Surgical History:  Procedure Laterality Date  . ADENOIDECTOMY    . BACK SURGERY    . HIP SURGERY    . SACROILIAC JOINT FUSION Right 11/15/2017   Procedure: SACROILIAC JOINT FUSION;  Surgeon: Venita LickBrooks, Dahari, MD;  [facility];  Service: Orthopedics;  Laterality: Right;  120 mins  . SACROILIAC JOINT FUSION Left 07/25/2018   Procedure: SACROILIAC JOINT FUSION;  Surgeon: Venita LickBrooks, Dahari, MD;  [facility];  Service: Orthopedics;  Laterality: Left;  SACROILIAC JOINT FUSION  . TONSILLECTOMY     48 years old        Home Medications    Prior to Admission medications   Medication Sig Start Date End Date Taking? Authorizing Provider  Cetirizine HCl 10 MG CAPS Take 10 mg by mouth 2 (two) times daily.     [provider]  doxycycline (VIBRAMYCIN) 100 MG  capsule Take 1 capsule (100 mg total) by mouth 2 (two) times daily. 11/11/18   Arlyn DunningMcLean, Nikole Swartzentruber A, PA-C  famotidine (PEPCID) 20 MG tablet Take 1 tablet (20 mg total) by mouth 2 (two) times daily. 02/14/18   Azalia Bilisampos, Kevin, MD  levocetirizine (XYZAL) 5 MG tablet Take 5 mg by mouth 2 (two) times daily.    [provider]  montelukast (SINGULAIR) 10 MG tablet Take 10 mg by mouth at bedtime.    [provider]  mupirocin nasal ointment (BACTROBAN) 2 % Apply to rash 11/11/18   Ronnie DossMcLean, Nyasiah Moffet A, PA-C  ondansetron (ZOFRAN) 4 MG tablet Take 1 tablet (4 mg total) by mouth every 8 (eight) hours as needed for nausea or vomiting. 07/25/18   Venita LickBrooks, Dahari, MD  triamcinolone (NASACORT ALLERGY 24HR) 55 MCG/ACT AERO nasal inhaler Place 1 spray into the nose daily.    [provider]    Family History History reviewed. No pertinent family history.  Social History Social History   Tobacco Use  . Smoking status: Current Every Day Smoker    Packs/day: 0.50    Years: 30.00    Pack years: 15.00    Types: Cigarettes  . Smokeless tobacco: Never Used  Substance Use Topics  . Alcohol use: Yes  Comment: stopped 2018  . Drug use: No     Allergies   Pregabalin, Sulfa antibiotics, and Gabapentin   Review of Systems Review of Systems  Constitutional: Negative for appetite change, fatigue and fever.  Gastrointestinal: Negative for nausea and vomiting.  Musculoskeletal: Negative for arthralgias, joint swelling and myalgias.  Skin: Positive for rash. Negative for wound.  Allergic/Immunologic: Negative for environmental allergies, food allergies and immunocompromised state.  Neurological: Negative for dizziness and weakness.  All other systems reviewed and are negative.    Physical Exam Updated Vital Signs BP (!) 161/92 (BP Location: Right Arm)   Pulse 95   Temp 98.6 F (37 C) (Oral)   Resp 19   SpO2 100%   Physical Exam Vitals signs and nursing note reviewed.   Constitutional:      Appearance: Normal appearance.  HENT:     Head: Normocephalic.  Eyes:     Conjunctiva/sclera: Conjunctivae normal.  Pulmonary:     Effort: Pulmonary effort is normal.  Musculoskeletal:       Arms:  Skin:    General: Skin is dry.  Neurological:     Mental Status: He is alert.  Psychiatric:        Mood and Affect: Mood normal.      ED Treatments / Results  Labs (all labs ordered are listed, but only abnormal results are displayed) Labs Reviewed - No data to display  EKG None  Radiology No results found.  Procedures Procedures (including critical care time)  Medications Ordered in ED Medications - No data to display   Initial Impression / Assessment and Plan / ED Course  I have reviewed the triage vital signs and the nursing notes.  Pertinent labs & imaging results that were available during my care of the patient were reviewed by me and considered in my medical decision making (see chart for details).          Final Clinical Impressions(s) / ED Diagnoses   Final diagnoses:  Cellulitis of right upper extremity    ED Discharge Orders         Ordered    mupirocin nasal ointment (BACTROBAN) 2 %     11/11/18 1639    doxycycline (VIBRAMYCIN) 100 MG capsule  2 times daily     11/11/18 1639           Kristine Royal 11/11/18 1639    Malvin Johns, MD 11/12/18 [phone]

## 2018-11-11 NOTE — Discharge Instructions (Signed)
Thank you for allowing me to care for you today. Please return to the emergency department if you have new or worsening symptoms. Take your medications as instructed.  ° °

## 2018-11-11 NOTE — ED Notes (Signed)
Patient verbalizes understanding of discharge instructions. Opportunity for questioning and answers were provided. Armband removed by staff, pt discharged from ED ambulatory.   

## 2018-11-11 NOTE — ED Triage Notes (Signed)
Pt reports possible insect bite to right upper arm x 3 day. Denies fever.

## 2019-05-06 DIAGNOSIS — K5790 Diverticulosis of intestine, part unspecified, without perforation or abscess without bleeding: Secondary | ICD-10-CM | POA: Insufficient documentation

## 2019-05-06 DIAGNOSIS — K760 Fatty (change of) liver, not elsewhere classified: Secondary | ICD-10-CM | POA: Insufficient documentation

## 2019-06-15 IMAGING — DX DG THORACIC SPINE 2V
3 series · 4 of 4 positions shown · non-contrast
Comparison: 09/18/2017

CLINICAL DATA: Lt arm and shoulder pain since [REDACTED] due to a
fall onto his lt shoulder. Pt has been seen at [REDACTED]
on [REDACTED]. Numbness lt shoulder and arm. Minimal movement with
numbness.

EXAM:
THORACIC SPINE 2 VIEWS

[t-spine ap]
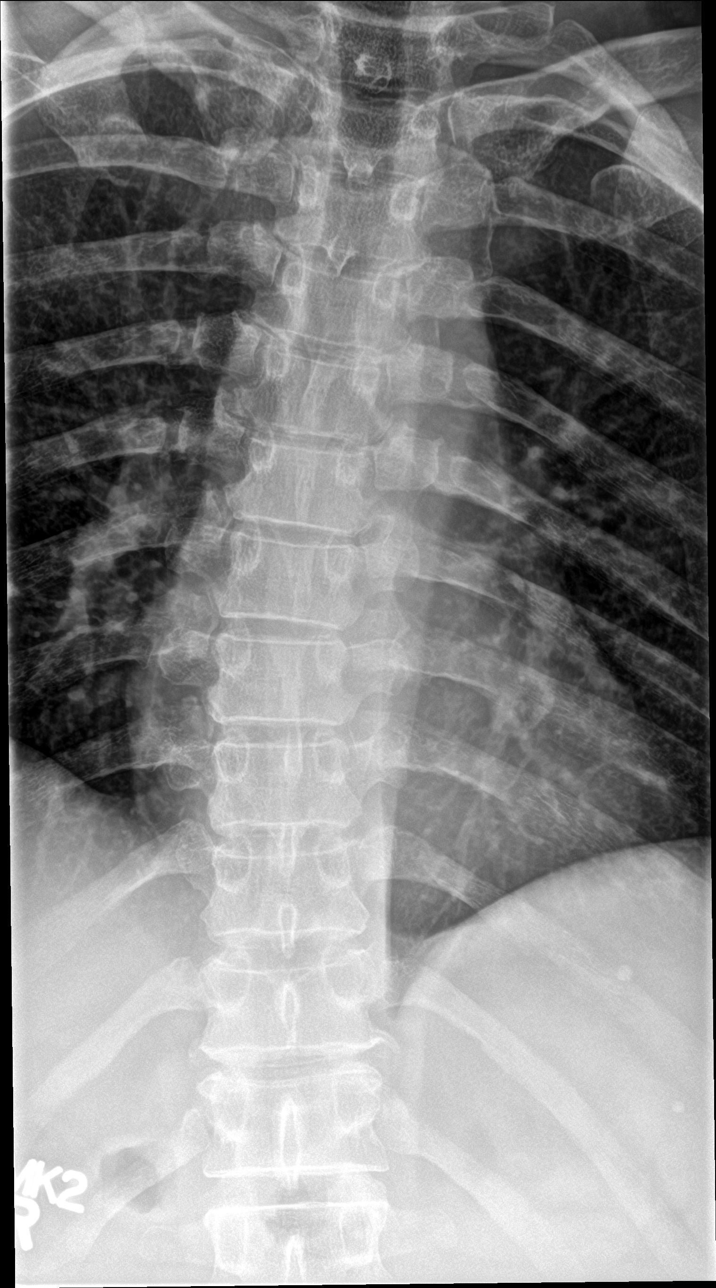

[Series 2: t-spine lat · 0.14mm/px · 2 of 2 slices shown]
[im 1/2]
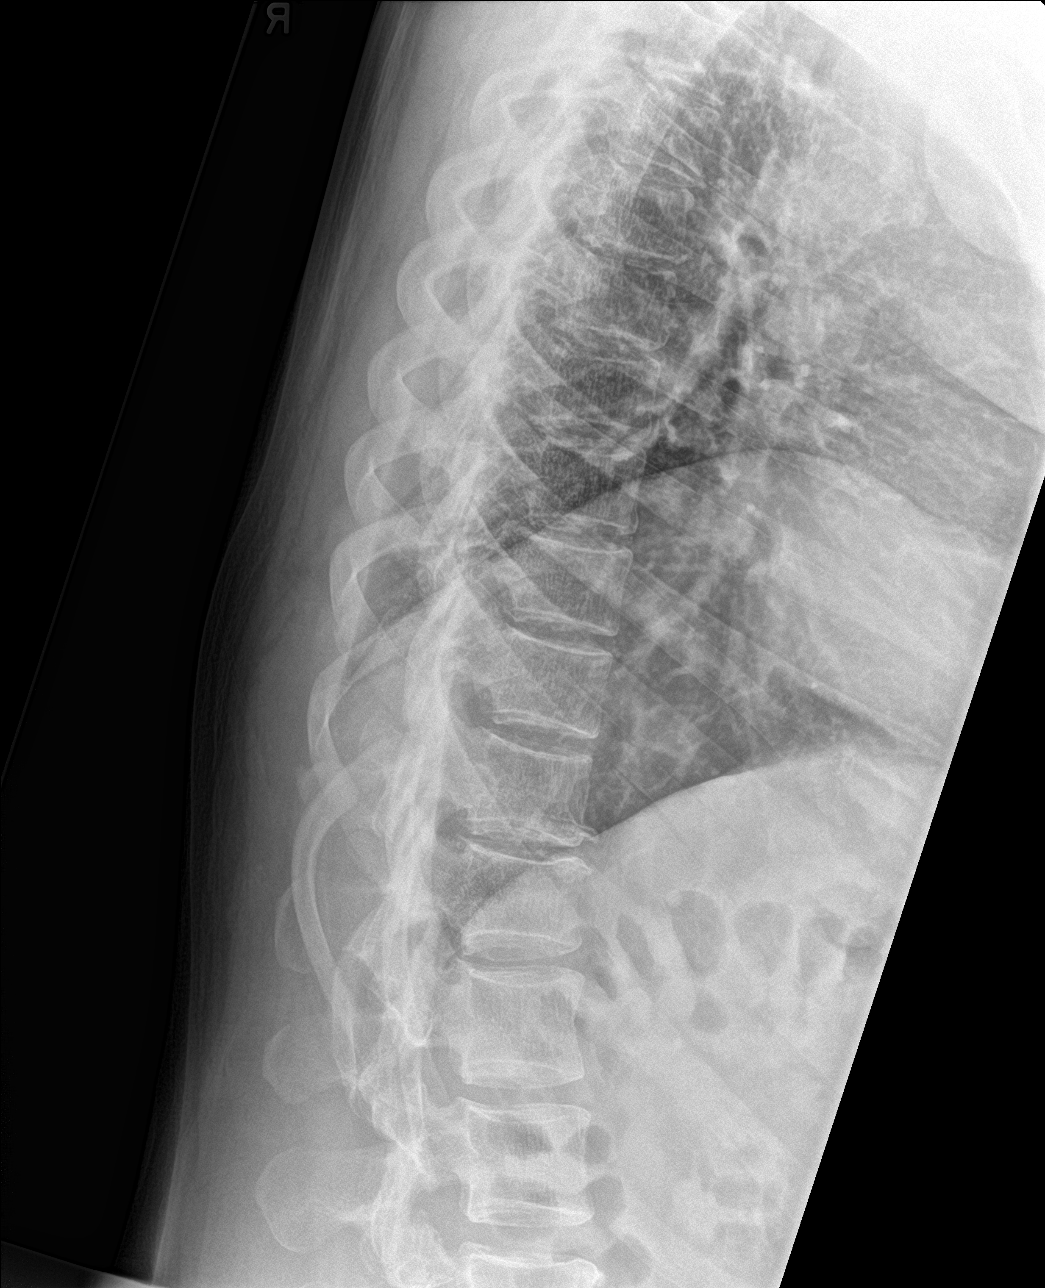
[im 2/2]
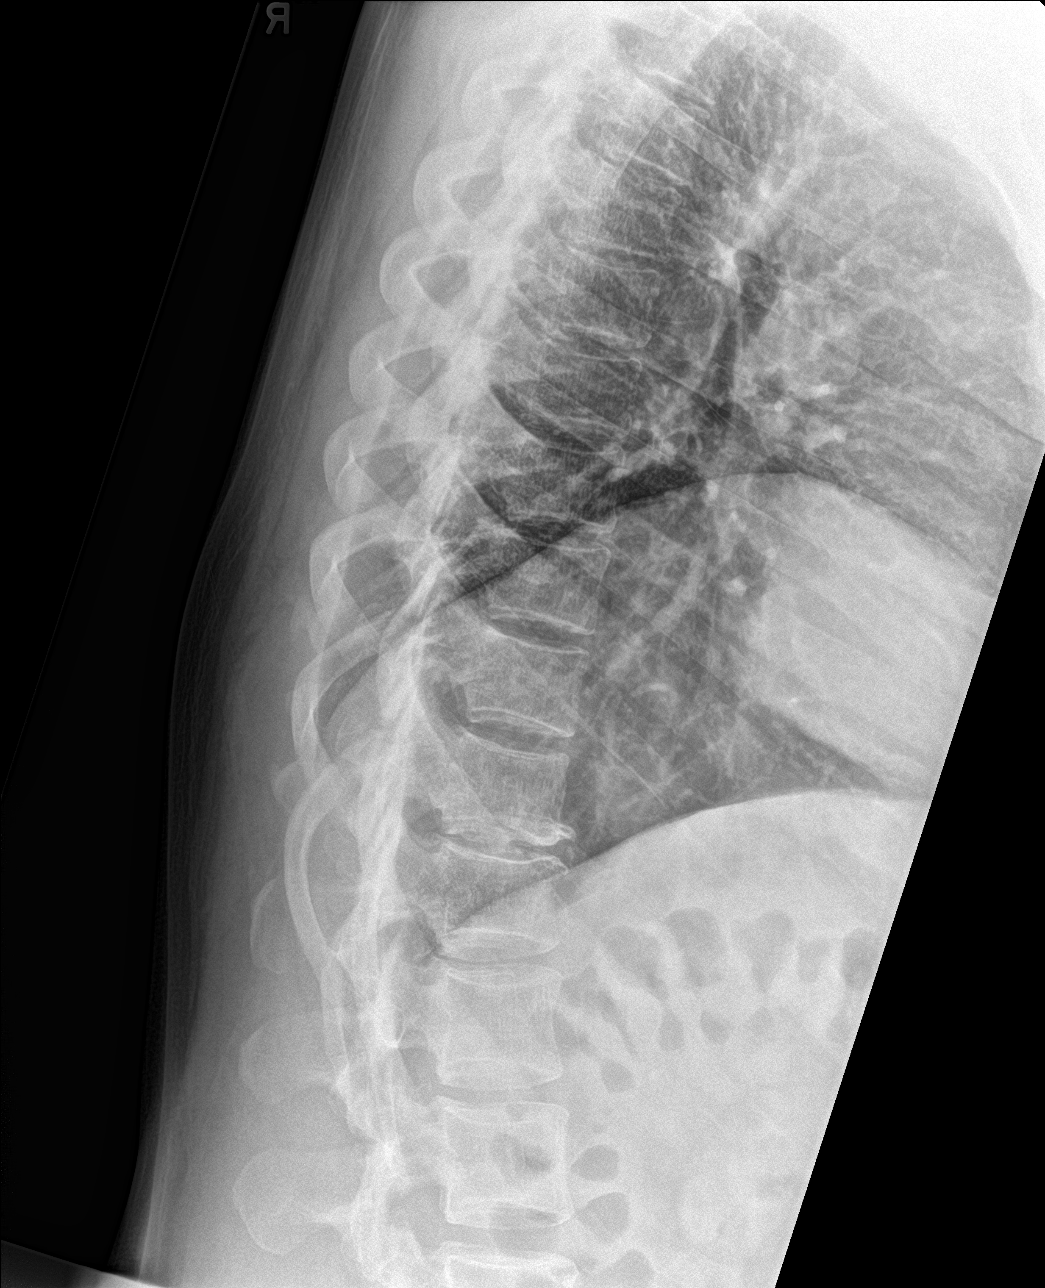

[t-spine swimmers]
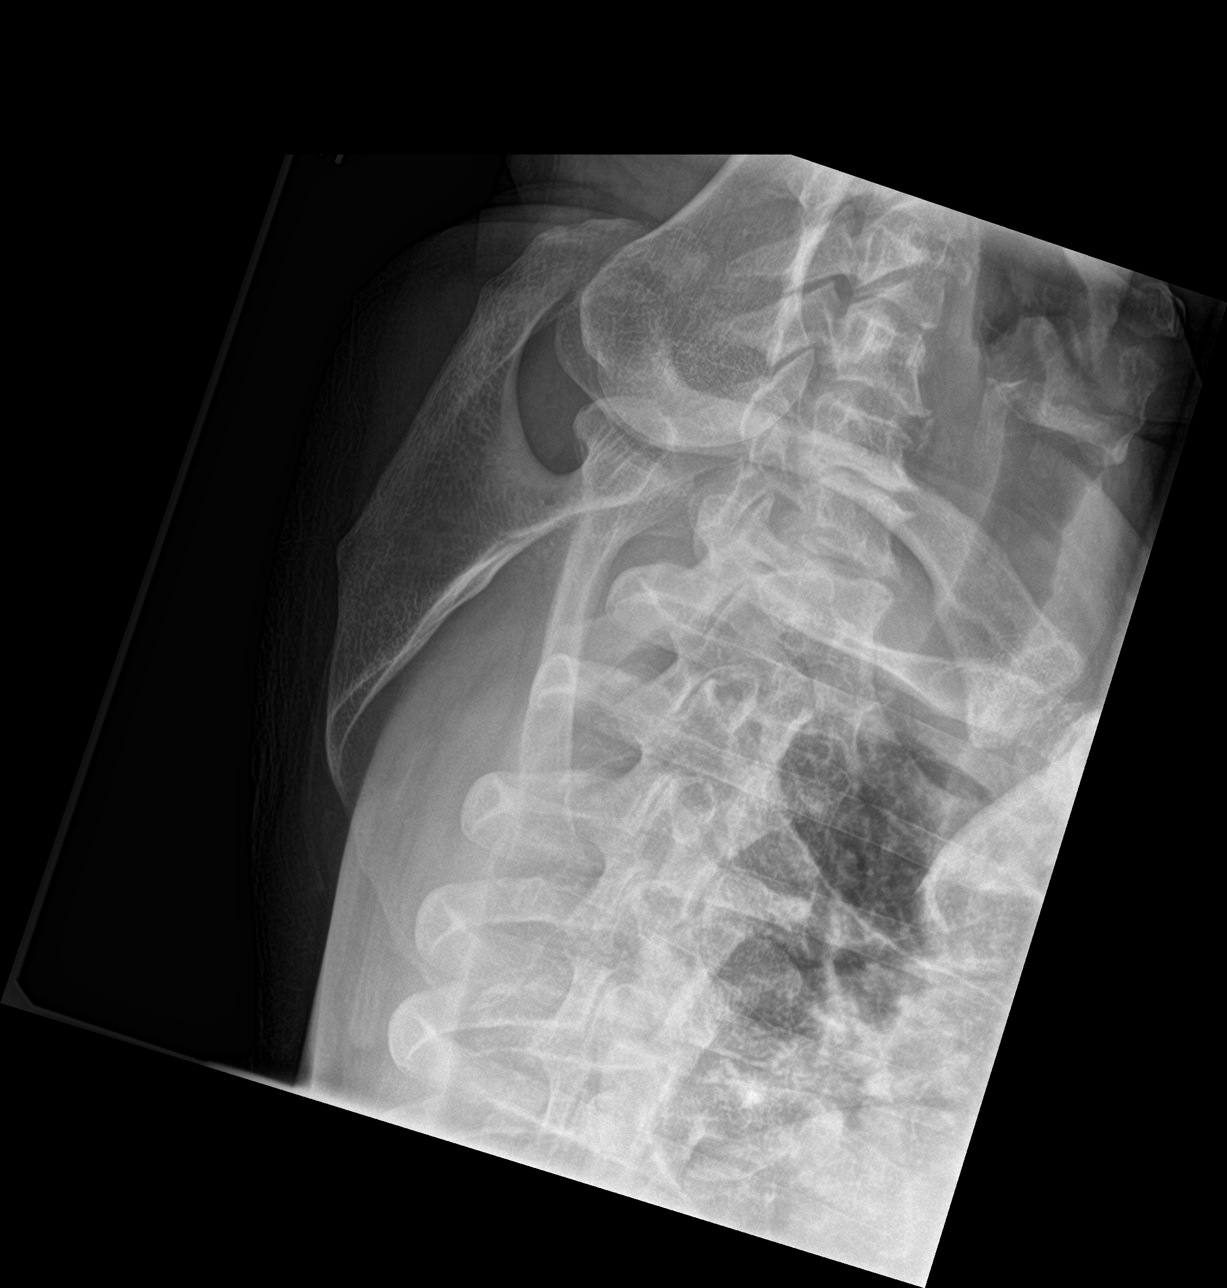

[4 of 4 positions shown; findings below may reference images not displayed]

FINDINGS: There is no acute fracture or subluxation. There is disc height loss
and mild degenerative change at T11-12. No suspicious lytic or
blastic lesions are identified.
IMPRESSION: No evidence for acute  abnormality.

## 2019-07-26 ENCOUNTER — Other Ambulatory Visit (HOSPITAL_COMMUNITY): Payer: Self-pay | Admitting: Orthopedic Surgery

## 2019-08-11 ENCOUNTER — Encounter (HOSPITAL_COMMUNITY): Payer: Self-pay | Admitting: Emergency Medicine

## 2019-08-11 ENCOUNTER — Emergency Department (HOSPITAL_COMMUNITY)
Admission: EM | Admit: 2019-08-11 | Discharge: 2019-08-11 | Disposition: A | Discharge status: Home / Self Care | Payer: No Typology Code available for payment source | Attending: Emergency Medicine | Admitting: Emergency Medicine

## 2019-08-11 ENCOUNTER — Other Ambulatory Visit: Payer: Self-pay

## 2019-08-11 DIAGNOSIS — M5412 Radiculopathy, cervical region: Secondary | ICD-10-CM | POA: Diagnosis not present

## 2019-08-11 DIAGNOSIS — M542 Cervicalgia: Secondary | ICD-10-CM

## 2019-08-11 DIAGNOSIS — Z79899 Other long term (current) drug therapy: Secondary | ICD-10-CM | POA: Insufficient documentation

## 2019-08-11 DIAGNOSIS — F17210 Nicotine dependence, cigarettes, uncomplicated: Secondary | ICD-10-CM | POA: Diagnosis not present

## 2019-08-11 DIAGNOSIS — M792 Neuralgia and neuritis, unspecified: Secondary | ICD-10-CM

## 2019-08-11 MED ORDER — OXYCODONE HCL 5 MG PO CAPS: 5.0000 mg | ORAL_CAPSULE | ORAL | 0 refills | Status: DC | PRN

## 2019-08-11 MED ORDER — OXYCODONE HCL 5 MG PO TABS
10.0000 mg | ORAL_TABLET | Freq: Once | ORAL | Status: AC
  Administered 2019-08-11: 12:00:00 10 mg via ORAL
  Filled 2019-08-11: qty 2

## 2019-08-11 MED ORDER — PREDNISONE 10 MG (21) PO TBPK: ORAL_TABLET | Freq: Every day | ORAL | 0 refills | Status: DC

## 2019-08-11 NOTE — ED Provider Notes (Signed)
[name] EMERGENCY DEPARTMENT Provider Note   CSN: [id number] Arrival date & time: 08/11/19  1001     History No chief complaint on file.   [name] is a 49 y.o. male.  The history is provided by the patient and medical records. No language interpreter was used.  Neck Injury This is a chronic problem. The problem occurs constantly. The problem has been gradually worsening. Pertinent negatives include no chest pain, no abdominal pain, no headaches and no shortness of breath. Nothing aggravates the symptoms. Nothing relieves the symptoms. He has tried nothing for the symptoms. The treatment provided no relief.       Past Medical History:  Diagnosis Date  . Angio-edema   . Anxiety   . Headache   . Lyme disease   . [facility] spotted fever   . Urticaria     Patient Active Problem List   Diagnosis Date Noted  . Recurrent urticaria 04/30/2018  . Allergic reaction 04/30/2018  . Chronic rhinitis 04/30/2018  . S/P lumbar fusion 11/15/2017    Past Surgical History:  Procedure Laterality Date  . ADENOIDECTOMY    . BACK SURGERY    . HIP SURGERY    . SACROILIAC JOINT FUSION Right 11/15/2017   Procedure: SACROILIAC JOINT FUSION;  Surgeon: Venita Lick, MD;  Location: [facility] OR;  Service: Orthopedics;  Laterality: Right;  120 mins  . SACROILIAC JOINT FUSION Left 07/25/2018   Procedure: SACROILIAC JOINT FUSION;  Surgeon: Venita Lick, MD;  Location: [facility] OR;  Service: Orthopedics;  Laterality: Left;  SACROILIAC JOINT FUSION  . TONSILLECTOMY     49 years old       No family history on file.  Social History   Tobacco Use  . Smoking status: Current Every Day Smoker    Packs/day: 0.50    Years: 30.00    Pack years: 15.00    Types: Cigarettes  . Smokeless tobacco: Never Used  Substance Use Topics  . Alcohol use: Yes    Comment: stopped 2018  . Drug use: No    Home Medications Prior to Admission medications   Medication Sig Start Date  End Date Taking? Authorizing Provider  Cetirizine HCl 10 MG CAPS Take 10 mg by mouth 2 (two) times daily.     [provider]  doxycycline (VIBRAMYCIN) 100 MG capsule Take 1 capsule (100 mg total) by mouth 2 (two) times daily. 11/11/18   Arlyn Dunning, PA-C  famotidine (PEPCID) 20 MG tablet Take 1 tablet (20 mg total) by mouth 2 (two) times daily. 02/14/18   Azalia Bilis, MD  levocetirizine (XYZAL) 5 MG tablet Take 5 mg by mouth 2 (two) times daily.    [provider]  montelukast (SINGULAIR) 10 MG tablet Take 10 mg by mouth at bedtime.    [provider]  mupirocin nasal ointment (BACTROBAN) 2 % Apply to rash 11/11/18   Ronnie Doss A, PA-C  ondansetron (ZOFRAN) 4 MG tablet Take 1 tablet (4 mg total) by mouth every 8 (eight) hours as needed for nausea or vomiting. 07/25/18   Venita Lick, MD  triamcinolone (NASACORT ALLERGY 24HR) 55 MCG/ACT AERO nasal inhaler Place 1 spray into the nose daily.    [provider]    Allergies    Pregabalin, Sulfa antibiotics, and Gabapentin  Review of Systems   Review of Systems  Constitutional: Negative for chills, diaphoresis, fatigue and fever.  Respiratory: Negative for chest tightness and shortness of breath.  Cardiovascular: Negative for chest pain.  Gastrointestinal: Negative for abdominal pain, constipation, diarrhea, nausea and vomiting.  Genitourinary: Negative for dysuria.  Musculoskeletal: Positive for neck pain. Negative for back pain.  Neurological: Positive for weakness and numbness. Negative for dizziness, tremors, facial asymmetry, light-headedness and headaches.  Psychiatric/Behavioral: Negative for agitation.  All other systems reviewed and are negative.   Physical Exam Updated Vital Signs BP (!) 158/103 (BP Location: Right Arm)   Pulse 98   Temp 97.7 F (36.5 C) (Oral)   Resp 16   SpO2 98%   Physical Exam Vitals and nursing note reviewed.  Constitutional:      General: He is not in  acute distress.    Appearance: He is well-developed. He is not ill-appearing, toxic-appearing or diaphoretic.  HENT:     Head: Normocephalic and atraumatic.     Nose: Nose normal. No congestion or rhinorrhea.     Mouth/Throat:     Mouth: Mucous membranes are moist.     Pharynx: No oropharyngeal exudate or posterior oropharyngeal erythema.  Eyes:     Conjunctiva/sclera: Conjunctivae normal.     Pupils: Pupils are equal, round, and reactive to light.  Cardiovascular:     Rate and Rhythm: Normal rate and regular rhythm.     Heart sounds: No murmur.  Pulmonary:     Effort: Pulmonary effort is normal. No respiratory distress.     Breath sounds: Normal breath sounds.  Abdominal:     Palpations: Abdomen is soft.     Tenderness: There is no abdominal tenderness.  Musculoskeletal:        General: Tenderness present.     Cervical back: Neck supple. Tenderness present.     Right lower leg: No edema.     Left lower leg: No edema.     Comments: Tenderness in left neck and with arm movement.  Skin:    General: Skin is warm and dry.     Capillary Refill: Capillary refill takes less than 2 seconds.     Findings: No erythema or rash.  Neurological:     Mental Status: He is alert and oriented to person, place, and time.     GCS: GCS eye subscore is 4. GCS verbal subscore is 5. GCS motor subscore is 6.     Cranial Nerves: No dysarthria.     Sensory: Sensory deficit present.     Motor: Weakness and abnormal muscle tone present.     Comments: Patient has decreased grip strength in left hand and decreased sensation in left hand. He has some contractures in his hands, wrist, and arm. He reports this is slightly worse than normal but has been gradually worsening over weeks. There is no acute changes over the last few days by his report. He also has his right leg in a brace and his legs have had no new neurologic abnormalities by his report.  Hoffmann test negative in both upper extremities to look for  myelopathy.  Psychiatric:        Mood and Affect: Mood normal.     ED Results / Procedures / Treatments   Labs (all labs ordered are listed, but only abnormal results are displayed) Labs Reviewed - No data to display  EKG None  Radiology No results found.  Procedures Procedures (including critical care time)  Medications Ordered in ED Medications  oxyCODONE (Oxy IR/ROXICODONE) immediate release tablet 10 mg (10 mg Oral Given 08/11/19 1135)    ED Course  I have reviewed the triage  vital signs and the nursing notes.  Pertinent labs & imaging results that were available during my care of the patient were reviewed by me and considered in my medical decision making (see chart for details).    MDM Rules/Calculators/A&P                      [name] is a 49 y.o. male with a past medical history significant for anxiety, headaches, RMSF, Lyme disease, angioedema, previous low back surgeries and SI joint fusions, and degenerative cervical spine disease who presents with continued and worsening neck pain radiating down his left arm.  He reports that he had an MRI done 4 days ago and has not yet heard the results of his C-spine MRI.  He says that over the last few weeks he has had worsening neck pain causing burning, numbness, and weakness going on his left arm.  According to his chart, he is being managed by a [facility] health for his spinal care and he was instructed that he would not be given steroids initially until his MRI was completed in case there was infection or other abnormality aside from the degenerative and narrowing disease.  Patient says that he has had no new falls, pops, or injuries since the MRI several days ago.  He reports his pain has just continued and he is having more burning and numbness sensation.  He is right-handed.  He denies fevers, chills, chest pain, shortness of breath, nausea, vomiting, or other complaints.  On exam, patient does have  numbness and weakness in his left hand including some contractures with extension of the fingers.  He has pain with movement.  It is a burning pain.  He also has tenderness in his neck.  His lungs were clear and chest was nontender.  Abdomen was nontender.  His right leg is in a brace due to his previous SI surgery.  Exam otherwise unremarkable.  No rashes seen.  Had a shared decision-making conversation with patient.  Given his lack of new injury or acute worsening of symptoms, we agreed to hold on repeat imaging or labs at this time.  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  This is likely the cause of his symptoms.  There was no cord signal abnormality however so do not feel there is acute compression that would need neurosurgical consultation today.  Patient would rather follow-up with his spine team but he would like to start steroids for the clearly radicular neuropathic pain he is experiencing today.  Given the lack of cord changes seen, it is reasonable to start him on steroids and given prescription for pain medication until he can see his spine team in the next several days.  Patient is amenable to this plan and understands extremely strict return precautions.  He had no other questions or concerns and will be discharged with the prescriptions.   Final Clinical Impression(s) / ED Diagnoses Final diagnoses:  Radicular pain in left arm  Neck pain on left side    Clinical Impression: 1. Radicular pain in left arm   2. Neck pain on left side     Disposition: Discharge  Condition: Good  I have discussed the results, Dx and Tx plan with the pt(& family if present). He/she/they expressed understanding and agree(s) with the plan. Discharge instructions discussed at great length. Strict return precautions discussed  and pt &/or family have verbalized understanding of the instructions. No further questions at time of  discharge.    Discharge Medication List as of 08/11/2019 11:28 AM    START taking these medications   Details  oxycodone (OXY-IR) 5 MG capsule Take 1 capsule (5 mg total) by mouth every 4 (four) hours as needed., Starting Sun 08/11/2019, Normal    predniSONE (STERAPRED UNI-PAK 21 TAB) 10 MG (21) TBPK tablet Take by mouth daily. Take 6 tabs by mouth daily  for 2 days, then 5 tabs for 2 days, then 4 tabs for 2 days, then 3 tabs for 2 days, 2 tabs for 2 days, then 1 tab by mouth daily for 2 days, Starting Sun 08/11/2019, Normal        Follow Up: Your neurosurgeon and spine team     [name] EMERGENCY DEPARTMENT [address] [id number] [address]c [place] [address] [phone]    Barbie Banner, MD [address] [place] [address] [phone]        Allea Kassner, Canary Brim, MD 08/11/19 1151

## 2019-08-11 NOTE — Discharge Instructions (Signed)
Your history and exam today are consistent with continued radicular/nerve pain going down your left arm causing the numbness, weakness, contractures, and pain.  We had a shared decision-making conversation together as we reviewed your MRI from several days ago.  Given the lack of new injuries, we agreed to hold on further new work-up today and instead treat you with steroids for the radicular pain as well as pain medication.  Please call your team tomorrow to discuss further management and if you develop any new or worsening symptoms acutely, please return to the nearest emergency department.

## 2019-08-11 NOTE — ED Triage Notes (Signed)
Pt here from home with c/o left arm pain and burning , pt had a n put pt mri of the neck 2 weeks ago at [facility] , showed spinal stenosis ,

## 2019-08-15 DIAGNOSIS — M47812 Spondylosis without myelopathy or radiculopathy, cervical region: Secondary | ICD-10-CM | POA: Insufficient documentation

## 2019-10-05 ENCOUNTER — Other Ambulatory Visit: Payer: Self-pay

## 2019-10-05 ENCOUNTER — Ambulatory Visit (HOSPITAL_COMMUNITY)
Admission: EM | Admit: 2019-10-05 | Discharge: 2019-10-05 | Disposition: A | Discharge status: Home / Self Care | Payer: 59 | Source: Home / Self Care

## 2019-10-05 ENCOUNTER — Encounter (HOSPITAL_COMMUNITY): Payer: Self-pay | Admitting: Emergency Medicine

## 2019-10-05 ENCOUNTER — Emergency Department (HOSPITAL_COMMUNITY)
Admission: EM | Admit: 2019-10-05 | Discharge: 2019-10-05 | Disposition: A | Discharge status: Home / Self Care | Payer: 59 | Attending: Emergency Medicine | Admitting: Emergency Medicine

## 2019-10-05 ENCOUNTER — Encounter (HOSPITAL_COMMUNITY): Payer: Self-pay

## 2019-10-05 ENCOUNTER — Telehealth: Payer: No Typology Code available for payment source

## 2019-10-05 DIAGNOSIS — M542 Cervicalgia: Secondary | ICD-10-CM | POA: Diagnosis not present

## 2019-10-05 DIAGNOSIS — Z79899 Other long term (current) drug therapy: Secondary | ICD-10-CM | POA: Diagnosis not present

## 2019-10-05 DIAGNOSIS — G4700 Insomnia, unspecified: Secondary | ICD-10-CM

## 2019-10-05 DIAGNOSIS — M792 Neuralgia and neuritis, unspecified: Secondary | ICD-10-CM | POA: Insufficient documentation

## 2019-10-05 DIAGNOSIS — F17210 Nicotine dependence, cigarettes, uncomplicated: Secondary | ICD-10-CM | POA: Diagnosis not present

## 2019-10-05 DIAGNOSIS — M4712 Other spondylosis with myelopathy, cervical region: Secondary | ICD-10-CM

## 2019-10-05 DIAGNOSIS — M62838 Other muscle spasm: Secondary | ICD-10-CM | POA: Insufficient documentation

## 2019-10-05 DIAGNOSIS — M4802 Spinal stenosis, cervical region: Secondary | ICD-10-CM

## 2019-10-05 LAB — BASIC METABOLIC PANEL
Anion gap: 14 (ref 5–15)
BUN: 8 mg/dL (ref 6–20)
CO2: 22 mmol/L (ref 22–32)
Calcium: 9.6 mg/dL (ref 8.9–10.3)
Chloride: 103 mmol/L (ref 98–111)
Creatinine, Ser: 1.03 mg/dL (ref 0.61–1.24)
GFR calc Af Amer: >60 mL/min (ref >60)
GFR calc non Af Amer: >60 mL/min (ref >60)
Glucose, Bld: 100 mg/dL — ABNORMAL HIGH (ref 70–99)
Potassium: 3.7 mmol/L (ref 3.5–5.1)
Sodium: 139 mmol/L (ref 135–145)

## 2019-10-05 LAB — MAGNESIUM: Magnesium: 2.1 mg/dL (ref 1.7–2.4)

## 2019-10-05 MED ORDER — METHOCARBAMOL 500 MG PO TABS: 500.0000 mg | ORAL_TABLET | Freq: Two times a day (BID) | ORAL | 0 refills | Status: DC

## 2019-10-05 MED ORDER — HYDROMORPHONE HCL 1 MG/ML IJ SOLN
2.0000 mg | Freq: Once | INTRAMUSCULAR | Status: AC
  Administered 2019-10-05: 2 mg via INTRAVENOUS
  Filled 2019-10-05: qty 2

## 2019-10-05 MED ORDER — HYDROXYZINE HCL 50 MG PO TABS: 50.0000 mg | ORAL_TABLET | Freq: Three times a day (TID) | ORAL | 0 refills | Status: DC | PRN

## 2019-10-05 MED ORDER — METHOCARBAMOL 500 MG PO TABS
1000.0000 mg | ORAL_TABLET | Freq: Once | ORAL | Status: AC
  Administered 2019-10-05: 1000 mg via ORAL
  Filled 2019-10-05: qty 2

## 2019-10-05 MED ORDER — METHOCARBAMOL 1000 MG/10ML IJ SOLN: 1000.0000 mg | Freq: Once | INTRAMUSCULAR | Status: DC

## 2019-10-05 NOTE — ED Triage Notes (Signed)
Pt c/o severe pain to neck radiating down L arm, pt due to have steroid injection 5/13, given tramadol, not helping, pt states he has not slept in days. Appears very uncomfortable.

## 2019-10-05 NOTE — ED Notes (Signed)
Pt discharge instructions reviewed with the patient. The patient verbalized understanding of instructions. Pt discharged. 

## 2019-10-05 NOTE — ED Triage Notes (Addendum)
Pt reports having problem with his sleep x 4 days. Pt reports for the past 4 days he is not able to sleep well. Pt reports he fall sleep for 20 min approx and wakes up with chills and night sweats. Pt states he feels physical exhausted, the symptoms feel the same as when he had Lyme disease. Pt reports he was seen at the ED last night for the same chief complaint.  Pt reports he is being evaluate at [facility] for cervical spine disease.

## 2019-10-05 NOTE — ED Notes (Signed)
Pt is scheduled for back surgery in 3 and 1/2 weeks. Pt stated that he has not been able to sleep in 3 days due to pain radiating down left arm into left hand.   Pt has ben on tramadol for pain but states that it has not been effective,  Pulses are palpable

## 2019-10-05 NOTE — ED Provider Notes (Signed)
[name] EMERGENCY DEPARTMENT Provider Note   CSN: [id number] Arrival date & time: 10/05/19  0240     History Chief Complaint  Patient presents with  . neck pain    [name] is a 49 y.o. male.  HPI 49 year old male with a history of multilevel cervical spondylosis, back pain, s/p lumbar fusion, anxiety, headaches, Lyme disease, RMSF, angioedema, previous low back surgeries and SI joint fusions, degenerative spine disease presents with continued and worsening neck pain rating down his arm.  Patient was seen in the ER on 08/11/2019 for a similar complaint.  He was prescribed a short course of oxycodone and prednisone.  Patient states that over the last 2 days his pain has flared up again.  He has an epidural injection scheduled with Dr. Shon Baton with [facility] on 5/13 with subsequent surgery pending.  He reports muscle spasms, shooting and burning radicular pain down his left arm that began approximately 2 days ago.  He has chronic numbness in his lower left forearm, but has not noticed any increase in weakness or numbness.  He reports severe muscle cramps and contractures in his hand.  He states that the pain is so bad that it is causing him to sweat.  He was given a prescription of Tramadol which he states has not been helping.  He states that he is allergic to gabapentin and Lyrica and thus has not been taking any medications for the radiculopathy.  He denies any fevers, chills, nausea, vomiting, chest pain, shortness of breath or any other symptoms.  Per chart review, his last MRI was done on 08/06/2019 which was consistent with multilevel moderate to advanced foraminal stenosis greatest at C5-C6 on the right and bilaterally at C6-7 and C7-T1.  There was also degenerative changes at T1-T2 and moderate to advanced right foraminal stenosis.  He has had no new falls, injuries, pops.    Past Medical History:  Diagnosis Date  . Angio-edema   . Anxiety     . Headache   . Lyme disease   . [facility] spotted fever   . Urticaria     Patient Active Problem List   Diagnosis Date Noted  . Recurrent urticaria 04/30/2018  . Allergic reaction 04/30/2018  . Chronic rhinitis 04/30/2018  . S/P lumbar fusion 11/15/2017    Past Surgical History:  Procedure Laterality Date  . ADENOIDECTOMY    . BACK SURGERY    . HIP SURGERY    . SACROILIAC JOINT FUSION Right 11/15/2017   Procedure: SACROILIAC JOINT FUSION;  Surgeon: Venita Lick, MD;  Location:  [facility] OR;  Service: Orthopedics;  Laterality: Right;  120 mins  . SACROILIAC JOINT FUSION Left 07/25/2018   Procedure: SACROILIAC JOINT FUSION;  Surgeon: Venita Lick, MD;  Location: [facility] OR;  Service: Orthopedics;  Laterality: Left;  SACROILIAC JOINT FUSION  . TONSILLECTOMY     49 years old       No family history on file.  Social History   Tobacco Use  . Smoking status: Current Every Day Smoker    Packs/day: 0.50    Years: 30.00    Pack years: 15.00    Types: Cigarettes  . Smokeless tobacco: Never Used  Substance Use Topics  . Alcohol use: Yes    Comment: stopped 2018  . Drug use: No    Home Medications Prior to Admission medications   Medication Sig Start Date End Date Taking? Authorizing Provider  acetaminophen (TYLENOL) 325 MG tablet Take  325 mg by mouth daily as needed for mild pain.   Yes [provider]  famotidine (PEPCID) 20 MG tablet Take 1 tablet (20 mg total) by mouth 2 (two) times daily. 02/14/18  Yes Azalia Bilis, MD  traMADol (ULTRAM) 50 MG tablet Take 50 mg by mouth 3 (three) times daily as needed for moderate pain. 10/01/19  Yes [provider]  triamcinolone (NASACORT ALLERGY 24HR) 55 MCG/ACT AERO nasal inhaler Place 1 spray into the nose daily.   Yes [provider]  Cetirizine HCl 10 MG CAPS Take 10 mg by mouth 2 (two) times daily.     [provider]  doxycycline (VIBRAMYCIN) 100 MG capsule Take 1 capsule (100 mg total) by mouth 2  (two) times daily. Patient not taking: Reported on 10/05/2019 11/11/18   Arlyn Dunning, PA-C  methocarbamol (ROBAXIN) 500 MG tablet Take 1 tablet (500 mg total) by mouth 2 (two) times daily. 10/05/19   Mare Ferrari, PA-C  mupirocin nasal ointment (BACTROBAN) 2 % Apply to rash Patient not taking: Reported on 10/05/2019 11/11/18   Ronnie Doss A, PA-C  ondansetron (ZOFRAN) 4 MG tablet Take 1 tablet (4 mg total) by mouth every 8 (eight) hours as needed for nausea or vomiting. Patient not taking: Reported on 10/05/2019 07/25/18   Venita Lick, MD  oxycodone (OXY-IR) 5 MG capsule Take 1 capsule (5 mg total) by mouth every 4 (four) hours as needed. Patient not taking: Reported on 10/05/2019 08/11/19   Tegeler, Canary Brim, MD  predniSONE (STERAPRED UNI-PAK 21 TAB) 10 MG (21) TBPK tablet Take by mouth daily. Take 6 tabs by mouth daily  for 2 days, then 5 tabs for 2 days, then 4 tabs for 2 days, then 3 tabs for 2 days, 2 tabs for 2 days, then 1 tab by mouth daily for 2 days Patient not taking: Reported on 10/05/2019 08/11/19   Tegeler, Canary Brim, MD    Allergies    Pregabalin, Sulfa antibiotics, and Gabapentin  Review of Systems   Review of Systems  Constitutional: Negative for chills and fever.  Respiratory: Negative for cough and shortness of breath.   Cardiovascular: Negative for chest pain and palpitations.  Gastrointestinal: Negative for abdominal pain and vomiting.  Musculoskeletal: Positive for neck pain and neck stiffness. Negative for arthralgias and back pain.  Skin: Negative for color change and rash.  Neurological: Positive for weakness and numbness. Negative for dizziness, tremors, seizures, syncope, facial asymmetry, speech difficulty and light-headedness.  All other systems reviewed and are negative.   Physical Exam Updated Vital Signs BP 131/87 (BP Location: Left Arm)   Pulse 78   Temp 98 F (36.7 C) (Oral)   Resp 16   SpO2 97%   Physical Exam Vitals and nursing note  reviewed.  Constitutional:      General: He is not in acute distress.    Appearance: Normal appearance. He is well-developed. He is not ill-appearing, toxic-appearing or diaphoretic.     Comments: Uncomfortable appearing  HENT:     Head: Normocephalic and atraumatic.     Nose: Nose normal. No congestion or rhinorrhea.     Mouth/Throat:     Mouth: Mucous membranes are moist.     Pharynx: Oropharynx is clear.  Eyes:     Extraocular Movements: Extraocular movements intact.     Conjunctiva/sclera: Conjunctivae normal.     Pupils: Pupils are equal, round, and reactive to light.  Cardiovascular:     Rate and Rhythm: Normal rate and  regular rhythm.     Pulses: Normal pulses.     Heart sounds: Normal heart sounds. No murmur.     Comments: 2+ radial pulses bilaterally Pulmonary:     Effort: Pulmonary effort is normal. No respiratory distress.     Breath sounds: Normal breath sounds.  Abdominal:     General: Abdomen is flat.     Palpations: Abdomen is soft.     Tenderness: There is no abdominal tenderness.  Musculoskeletal:        General: Tenderness (Midline C-spine and left-sided paraspinal and trapezius tenderness) present. Normal range of motion.     Cervical back: Normal range of motion and neck supple. Tenderness present. No rigidity.  Skin:    General: Skin is warm and dry.     Capillary Refill: Capillary refill takes less than 2 seconds.     Findings: No bruising, erythema or rash.  Neurological:     Mental Status: He is alert and oriented to person, place, and time.     GCS: GCS eye subscore is 4. GCS verbal subscore is 5. GCS motor subscore is 6.     Cranial Nerves: Cranial nerves are intact.     Sensory: Sensory deficit present.     Motor: Weakness present.     Gait: Gait is intact.     Deep Tendon Reflexes:     Reflex Scores:      Tricep reflexes are 2+ on the right side and 2+ on the left side.      Bicep reflexes are 2+ on the right side and 2+ on the left side.       Brachioradialis reflexes are 2+ on the right side and 2+ on the left side.    Comments: Decreased sensation over dorsum of wrist and left hand along with decreased grip strength. Visible contracture in hand, wrist, and arm. Range of motion of fingers and shoulder intact.  No noticeable swelling, crepitus, deformities, rashes. No acute new changes in sensations and range of motion.   Psychiatric:        Mood and Affect: Mood normal.        Behavior: Behavior normal.     ED Results / Procedures / Treatments   Labs (all labs ordered are listed, but only abnormal results are displayed) Labs Reviewed  BASIC METABOLIC PANEL - Abnormal; Notable for the following components:      Result Value   Glucose, Bld 100 (*)    All other components within normal limits  MAGNESIUM    EKG None  Radiology No results found.  Procedures Procedures (including critical care time)  Medications Ordered in ED Medications  HYDROmorphone (DILAUDID) injection 2 mg (2 mg Intravenous Given 10/05/19 0813)  methocarbamol (ROBAXIN) tablet 1,000 mg (1,000 mg Oral Given 10/05/19 9326)    ED Course  I have reviewed the triage vital signs and the nursing notes.  Pertinent labs & imaging results that were available during my care of the patient were reviewed by me and considered in my medical decision making (see chart for details).    MDM Rules/Calculators/A&P                     49 year old male with worsening burning, shooting pain down his left arm x2 days. On presentation ER, patient has alert and oriented, nontoxic-appearing, though visibly uncomfortable.  He is actively moving around his left shoulder and trying to get comfortable.  Physical exam with neuro deficits and weakness unchanged from  previous visits and confirmed by patient.  He refers that he has an epidural scheduled on May 13th and subsequent surgery afterwards.  Vitals overall reassuring.  BMP without significant electrolyte abnormalities, normal  magnesium.  Patient treated with Dilaudid and Robaxin in the ER and notes significant improvement.  Patient asking to go home.  On reevaluation, no change in neuro deficits.  I do not think any additional imaging is needed at this time as patient is noted to have severe degenerative cervical disease with no reinjuries or recent falls.  I prescribed Robaxin for muscle spasms, deferred further pain management to his orthopedic doctor.  Patient is very pleasantly agreeable to this plan and agrees that the muscle relaxer will be more helpful than the pain medication.  Strict return precautions given.  Patient voices understanding is agreeable to this plan.  At this stage in ED course, the patient has been adequately screened and is stable for discharge.  Final Clinical Impression(s) / ED Diagnoses Final diagnoses:  Radicular pain in left arm  Muscle spasm of left lower extremity    Rx / DC Orders ED Discharge Orders         Ordered    methocarbamol (ROBAXIN) 500 MG tablet  2 times daily,   Status:  Discontinued     10/05/19 1002    methocarbamol (ROBAXIN) 500 MG tablet  2 times daily     10/05/19 1004           Leone Brand 10/05/19 1016    Linwood Dibbles, MD 10/05/19 1614

## 2019-10-05 NOTE — Discharge Instructions (Addendum)
Please make sure to follow-up with your orthopedic surgeon on Monday for further pain management.  I prescribed a muscle relaxer called Robaxin, please do not drink or drive on this medication to take at night as it can make you sleepy..  Return to the ER if your symptoms worsen.

## 2019-10-05 NOTE — ED Provider Notes (Signed)
[facility]   MRN: [id number] DOB: 22-Sep-1970  Subjective:   [name] is a 49 y.o. male with a past medical history of multilevel cervical spondylosis, back pain, s/p lumbar fusion (2019), anxiety, headaches, Lyme disease, RMSF, angioedema, previous low back surgeries and SI joint fusions, degenerative spine disease presenting for help with insomnia. Has hx of cervical spondylosis, chronic neck pain, being managed by Dr. Shon Baton with [facility]. Has an epidural injection scheduled with Dr. Shon Baton on 5/13 with subsequent surgery pending. He was seen in the ER on 08/11/2019, earlier this morning 10/05/2019.  Was given Dilaudid, Robaxin. Review of Smoot Database shows patient should have Tramadol left to use through 10/08/2019.   States that insomnia is the actual reason he went to the ER over night into this morning. Has tried melatonin otc at a dose of 10mg . Has a hx of Lyme disease, has a hx of meningitis.  He is very concerned and confused that this may be happening.  However, ultimately his primary goal is to get help with getting sleep.  States that he has not slept in 4 to 5 days.  He does intend on following up with Dr. soon as possible as well.  No current facility-administered medications for this encounter.  Current Outpatient Medications:  .  acetaminophen (TYLENOL) 325 MG tablet, Take 325 mg by mouth daily as needed for mild pain., Disp: , Rfl:  .  famotidine (PEPCID) 20 MG tablet, Take 1 tablet (20 mg total) by mouth 2 (two) times daily., Disp: 10 tablet, Rfl: 0 .  hydrOXYzine (ATARAX/VISTARIL) 50 MG tablet, Take 1 tablet (50 mg total) by mouth 3 (three) times daily as needed., Disp: 30 tablet, Rfl: 0 .  methocarbamol (ROBAXIN) 500 MG tablet, Take 1 tablet (500 mg total) by mouth 2 (two) times daily., Disp: 20 tablet, Rfl: 0 .  traMADol (ULTRAM) 50 MG tablet, Take 50 mg by mouth 3 (three) times daily as needed for moderate pain., Disp: , Rfl:  .  triamcinolone  (NASACORT ALLERGY 24HR) 55 MCG/ACT AERO nasal inhaler, Place 1 spray into the nose daily., Disp: , Rfl:    Allergies  Allergen Reactions  . Pregabalin Hives, Shortness Of Breath and Other (See Comments)    Insomnia Mood alter  . Sulfa Antibiotics Anaphylaxis  . Gabapentin Hives, Nausea And Vomiting and Nausea Only    Sweating, Fever, Hives Other reaction(s): Abdominal pain, Fever, Insomnia    Past Medical History:  Diagnosis Date  . Angio-edema   . Anxiety   . Headache   . Lyme disease   . [facility] spotted fever   . Urticaria      Past Surgical History:  Procedure Laterality Date  . ADENOIDECTOMY    . BACK SURGERY    . HIP SURGERY    . SACROILIAC JOINT FUSION Right 11/15/2017   Procedure: SACROILIAC JOINT FUSION;  Surgeon: 11/17/2017, MD;  Location: [facility] OR;  Service: Orthopedics;  Laterality: Right;  120 mins  . SACROILIAC JOINT FUSION Left 07/25/2018   Procedure: SACROILIAC JOINT FUSION;  Surgeon: 07/27/2018, MD;  Location: [facility] OR;  Service: Orthopedics;  Laterality: Left;  SACROILIAC JOINT FUSION  . TONSILLECTOMY     49 years old    History reviewed. No pertinent family history.  Social History   Tobacco Use  . Smoking status: Current Every Day Smoker    Packs/day: 0.50    Years: 30.00    Pack years: 15.00    Types:  Cigarettes  . Smokeless tobacco: Never Used  Substance Use Topics  . Alcohol use: Yes    Comment: stopped 2018  . Drug use: No    ROS Denies fever, headache, confusion, neck stiffness, no new falls or trauma.  Objective:   Vitals: BP 138/89 (BP Location: Right Arm)   Pulse 89   Temp 98.2 F (36.8 C) (Oral)   Resp 16   SpO2 98%   Physical Exam Constitutional:      General: He is not in acute distress.    Appearance: Normal appearance. He is well-developed and normal weight. He is not ill-appearing, toxic-appearing or diaphoretic.  HENT:     Head: Normocephalic and atraumatic.     Right Ear: External ear normal.     Left  Ear: External ear normal.     Nose: Nose normal.     Mouth/Throat:     Pharynx: Oropharynx is clear.  Eyes:     General: No scleral icterus.       Right eye: No discharge.        Left eye: No discharge.     Extraocular Movements: Extraocular movements intact.     Pupils: Pupils are equal, round, and reactive to light.  Cardiovascular:     Rate and Rhythm: Normal rate.  Pulmonary:     Effort: Pulmonary effort is normal.  Musculoskeletal:     Cervical back: Normal range of motion.  Neurological:     Mental Status: He is alert and oriented to person, place, and time.  Psychiatric:        Mood and Affect: Mood normal.        Behavior: Behavior normal.        Thought Content: Thought content normal.        Judgment: Judgment normal.     Results for orders placed or performed during the hospital encounter of 10/05/19 (from the past 24 hour(s))  Basic metabolic panel     Status: Abnormal   Collection Time: 10/05/19  8:07 AM  Result Value Ref Range   Sodium 139 135 - 145 mmol/L   Potassium 3.7 3.5 - 5.1 mmol/L   Chloride 103 98 - 111 mmol/L   CO2 22 22 - 32 mmol/L   Glucose, Bld 100 (H) 70 - 99 mg/dL   BUN 8 6 - 20 mg/dL   Creatinine, Ser 9.93 0.61 - 1.24 mg/dL   Calcium 9.6 8.9 - 71.6 mg/dL   GFR calc non Af Amer >60 >60 mL/min   GFR calc Af Amer >60 >60 mL/min   Anion gap 14 5 - 15  Magnesium     Status: None   Collection Time: 10/05/19  8:07 AM  Result Value Ref Range   Magnesium 2.1 1.7 - 2.4 mg/dL    MRI cervical 96/78/9381 CONCLUSION:   Multilevel cervical spondylosis, as detailed above, contributes to multilevel moderate to advanced foraminal stenosis, greatest at C5-C6 on the right and bilaterally at C6-7 and C7-T1 (left greater than right). There is mild canal stenosis at C5-C6 and C6-C7.  Degenerative changes at T1-2 result in moderate to advanced right foraminal stenosis, incompletely evaluated.  Assessment and Plan :   I have reviewed the PDMP during this  encounter.  1. Neck pain   2. Chronic neck pain   3. Spondylosis, cervical, with myelopathy   4. Foraminal stenosis of cervical region   5. Insomnia, unspecified type     Recommended following regimen as prescribed by Dr. Shon Baton. Will use  hydroxyzine for his insomnia.  Counseled against using all 3 of Robaxin, tramadol and hydroxyzine at the same time given risk of side effects, drowsiness, confusion and increased risk for falls with all 3. Patient was agreeable to this. Follow up asap with Dr. Rolena Infante. Counseled patient on potential for adverse effects with medications prescribed/recommended today, ER and return-to-clinic precautions discussed, patient verbalized understanding.   Jaynee Eagles, PA-C 10/05/19 1627

## 2019-10-08 ENCOUNTER — Encounter (HOSPITAL_COMMUNITY): Payer: Self-pay | Admitting: Emergency Medicine

## 2019-10-08 ENCOUNTER — Emergency Department (HOSPITAL_COMMUNITY)
Admission: EM | Admit: 2019-10-08 | Discharge: 2019-10-08 | Disposition: A | Discharge status: Home / Self Care | Payer: 59 | Attending: Emergency Medicine | Admitting: Emergency Medicine

## 2019-10-08 ENCOUNTER — Ambulatory Visit (HOSPITAL_COMMUNITY)
Admission: EM | Admit: 2019-10-08 | Discharge: 2019-10-08 | Disposition: A | Discharge status: Home / Self Care | Payer: 59 | Source: Home / Self Care

## 2019-10-08 ENCOUNTER — Encounter (HOSPITAL_COMMUNITY): Payer: Self-pay

## 2019-10-08 DIAGNOSIS — Z20822 Contact with and (suspected) exposure to covid-19: Secondary | ICD-10-CM | POA: Diagnosis not present

## 2019-10-08 DIAGNOSIS — G4700 Insomnia, unspecified: Secondary | ICD-10-CM | POA: Diagnosis not present

## 2019-10-08 DIAGNOSIS — R61 Generalized hyperhidrosis: Secondary | ICD-10-CM | POA: Insufficient documentation

## 2019-10-08 DIAGNOSIS — Z5321 Procedure and treatment not carried out due to patient leaving prior to being seen by health care provider: Secondary | ICD-10-CM | POA: Insufficient documentation

## 2019-10-08 DIAGNOSIS — T887XXA Unspecified adverse effect of drug or medicament, initial encounter: Secondary | ICD-10-CM | POA: Diagnosis not present

## 2019-10-08 LAB — SARS CORONAVIRUS 2 (TAT 6-24 HRS): SARS Coronavirus 2: NEGATIVE

## 2019-10-08 MED ORDER — PREDNISONE 10 MG PO TABS: 20.0000 mg | ORAL_TABLET | Freq: Every day | ORAL | 0 refills | Status: DC

## 2019-10-08 NOTE — ED Triage Notes (Signed)
Patient reports that his surgeon from [facility] sent him here. Patient reports the provider "felt confident its an allergic reaction to tramadol"

## 2019-10-08 NOTE — ED Notes (Signed)
Pt states he is going to [facility] because he doesn't want to wait any longer.  Pt seen walking out the door

## 2019-10-08 NOTE — ED Triage Notes (Addendum)
Pt states for about 2 week he has been having constant sweating and unable to sleep at night- pt states he was seen for this on the 8th- pt states he was being worked up like this was coming from some cervical issues he has been having- pt was seen at [facility] today and had epidural injection c6. P:t states his provider there sent him over here because he thought he may be having  reaction to tramadol because these sxs started when he started this medication. Pts last dose of tramadol at 0400. Pt has no sob or airway swelling, airway intact. No rash or hives.

## 2019-10-08 NOTE — Discharge Instructions (Addendum)
Stop the tramadol and call your doctor to discuss medication alternatives Take benadryl this evening  I do not believe this is an allergic reaction,lets monitor after stopping the medication.  If worsening sensation of swelling, difficulty breathing occur, you will need to report to the Emergency Department for evaluation

## 2019-10-08 NOTE — ED Provider Notes (Signed)
[facility]    CSN: [id number] Arrival date & time: 10/08/19  1438      History   Chief Complaint Chief Complaint  Patient presents with  . possible allergic reaction    HPI [name] is a 49 y.o. male.   Patient reports for evaluation of possible allergic reaction.  It is believed that patient may have had allergic reaction to tramadol.  He reports has been taking tramadol for several weeks for severe chronic neck pain.  He has been monitored by orthopedics for chronic neck issues.  Reports over the last 4 days he has been having sweating, feeling of throat swelling and difficulty sleeping.  He reports these things will wake him up.  He reports he felt like he was having difficulty time breathing this morning and was sweating.  Reports he went to his neck injection appointment following feeling this way and was doing okay.  He reports he went home and took a nap and woke up feeling that way.  He contacted his orthopedist and discussed this and they recommended come to urgent care or the emergency department.  He had previously gone emergency department today however wait time was too long so he can urgent care.   In urgent care he reports he has had improvement in his symptoms since earlier.  He denies a skin rash.  Reports his throat does not feel swollen in clinic currently.  He reports he is not struggling to breathe or having difficulty swallowing.  Denies skin rash.  Denies tongue swelling or oral swelling.  He believes strongly this is related to tramadol.  He notes that he was in the hospital over the weekend and did not have tramadol a day and did not have any of these feelings.  He received a cortisone injection earlier today     Past Medical History:  Diagnosis Date  . Angio-edema   . Anxiety   . Headache   . Lyme disease   . [facility] spotted fever   . Urticaria     Patient Active Problem List   Diagnosis Date Noted  . Recurrent  urticaria 04/30/2018  . Allergic reaction 04/30/2018  . Chronic rhinitis 04/30/2018  . S/P lumbar fusion 11/15/2017    Past Surgical History:  Procedure Laterality Date  . ADENOIDECTOMY    . BACK SURGERY    . HIP SURGERY    . SACROILIAC JOINT FUSION Right 11/15/2017   Procedure: SACROILIAC JOINT FUSION;  Surgeon: Venita Lick, MD;  Location: [facility] OR;  Service: Orthopedics;  Laterality: Right;  120 mins  . SACROILIAC JOINT FUSION Left 07/25/2018   Procedure: SACROILIAC JOINT FUSION;  Surgeon: Venita Lick, MD;  Location: [facility] OR;  Service: Orthopedics;  Laterality: Left;  SACROILIAC JOINT FUSION  . TONSILLECTOMY     49 years old       Home Medications    Prior to Admission medications   Medication Sig Start Date End Date Taking? Authorizing Provider  acetaminophen (TYLENOL) 325 MG tablet Take 325 mg by mouth daily as needed for mild pain.    [provider]  famotidine (PEPCID) 20 MG tablet Take 1 tablet (20 mg total) by mouth 2 (two) times daily. 02/14/18   Azalia Bilis, MD  hydrOXYzine (ATARAX/VISTARIL) 50 MG tablet Take 1 tablet (50 mg total) by mouth 3 (three) times daily as needed. 10/05/19   Wallis Bamberg, PA-C  methocarbamol (ROBAXIN) 500 MG tablet Take 1 tablet (500 mg total) by mouth  2 (two) times daily. 10/05/19   Mare Ferrari, PA-C  triamcinolone (NASACORT ALLERGY 24HR) 55 MCG/ACT AERO nasal inhaler Place 1 spray into the nose daily.    [provider]  Cetirizine HCl 10 MG CAPS Take 10 mg by mouth 2 (two) times daily.   10/05/19  [provider]  mupirocin nasal ointment (BACTROBAN) 2 % Apply to rash Patient not taking: Reported on 10/05/2019 11/11/18 10/05/19  Arlyn Dunning, PA-C    Family History No family history on file.  Social History Social History   Tobacco Use  . Smoking status: Current Every Day Smoker    Packs/day: 0.50    Years: 30.00    Pack years: 15.00    Types: Cigarettes  . Smokeless tobacco: Never Used  Substance Use  Topics  . Alcohol use: Yes    Comment: stopped 2018  . Drug use: No     Allergies   Pregabalin, Sulfa antibiotics, and Gabapentin   Review of Systems Review of Systems   Physical Exam Triage Vital Signs ED Triage Vitals  Enc Vitals Group     BP 10/08/19 1533 (!) 144/89     Pulse Rate 10/08/19 1533 (!) 107     Resp 10/08/19 1533 19     Temp 10/08/19 1533 98.4 F (36.9 C)     Temp Source 10/08/19 1533 Oral     SpO2 10/08/19 1533 98 %     Weight --      Height --      Head Circumference --      Peak Flow --      Pain Score 10/08/19 1530 0     Pain Loc --      Pain Edu? --      Excl. in GC? --    No data found.  Updated Vital Signs BP (!) 144/89 (BP Location: Right Arm)   Pulse (!) 107   Temp 98.4 F (36.9 C) (Oral)   Resp 19   SpO2 98%   Visual Acuity Right Eye Distance:   Left Eye Distance:   Bilateral Distance:    Right Eye Near:   Left Eye Near:    Bilateral Near:     Physical Exam Vitals and nursing note reviewed.  Constitutional:      General: He is not in acute distress.    Appearance: He is well-developed. He is not ill-appearing.  HENT:     Head: Normocephalic and atraumatic.     Mouth/Throat:     Mouth: Mucous membranes are moist.     Pharynx: Oropharynx is clear.     Comments: No angioedema.  Tongue not swollen.  Oropharynx clear.  No stridor. Eyes:     Conjunctiva/sclera: Conjunctivae normal.     Pupils: Pupils are equal, round, and reactive to light.  Cardiovascular:     Rate and Rhythm: Normal rate and regular rhythm.     Heart sounds: No murmur.  Pulmonary:     Effort: Pulmonary effort is normal. No respiratory distress.     Breath sounds: Normal breath sounds.  Abdominal:     Palpations: Abdomen is soft.     Tenderness: There is no abdominal tenderness.  Musculoskeletal:     Cervical back: Neck supple.  Skin:    General: Skin is warm and dry.     Findings: No rash.  Neurological:     Mental Status: He is alert.       UC Treatments / Results  Labs (all  labs ordered are listed, but only abnormal results are displayed) Labs Reviewed  SARS CORONAVIRUS 2 (TAT 6-24 HRS)    EKG   Radiology No results found.  Procedures Procedures (including critical care time)  Medications Ordered in UC Medications - No data to display  Initial Impression / Assessment and Plan / UC Course  I have reviewed the triage vital signs and the nursing notes.  Pertinent labs & imaging results that were available during my care of the patient were reviewed by me and considered in my medical decision making (see chart for details).     #Adverse reaction medicine Patient 48 year old presenting with adverse reaction to tramadol.  Likely this is a reaction to tramadol as opposed to an allergic reaction.  We will have him stop.  Airway is not compromised and lungs are clear.  We will have him use hydroxyzine and Benadryl tonight.  Brief consideration of prednisone however I do not believe he needs this.  He also received a cortisone injection so he does have some steroid presence.  Given he does have symptoms it could be consistent with intermittent fevers and has frequent contact with healthcare setting, will test for Covid.  Strict follow-up in emergency department precautions were discussed.  Patient to follow-up with orthopedist for further pain management.  Patient verbalized understanding the plan. Final Clinical Impressions(s) / UC Diagnoses   Final diagnoses:  Non-dose-related adverse effect of medication, initial encounter     Discharge Instructions     Stop the tramadol and call your doctor to discuss medication alternatives Take benadryl this evening  I do not believe this is an allergic reaction,lets monitor after stopping the medication.  If worsening sensation of swelling, difficulty breathing occur, you will need to report to the Emergency Department for evaluation        ED Prescriptions     Medication Sig Dispense Auth. Provider   predniSONE (DELTASONE) 10 MG tablet  (Status: Discontinued) Take 2 tablets (20 mg total) by mouth daily for 3 days. 6 tablet Dell Hurtubise, Marguerita Beards, PA-C     PDMP not reviewed this encounter.   Purnell Shoemaker, PA-C 10/08/19 2107

## 2019-10-09 ENCOUNTER — Encounter (HOSPITAL_COMMUNITY): Payer: Self-pay

## 2019-10-09 ENCOUNTER — Other Ambulatory Visit: Payer: Self-pay

## 2019-10-09 ENCOUNTER — Emergency Department (HOSPITAL_COMMUNITY)
Admission: EM | Admit: 2019-10-09 | Discharge: 2019-10-09 | Disposition: A | Discharge status: Home / Self Care | Payer: 59 | Source: Home / Self Care | Attending: Emergency Medicine | Admitting: Emergency Medicine

## 2019-10-09 ENCOUNTER — Emergency Department (HOSPITAL_COMMUNITY): Payer: 59

## 2019-10-09 ENCOUNTER — Encounter (HOSPITAL_COMMUNITY): Payer: Self-pay | Admitting: Emergency Medicine

## 2019-10-09 ENCOUNTER — Emergency Department (HOSPITAL_COMMUNITY)
Admission: EM | Admit: 2019-10-09 | Discharge: 2019-10-09 | Disposition: A | Discharge status: Home / Self Care | Payer: 59 | Attending: Emergency Medicine | Admitting: Emergency Medicine

## 2019-10-09 DIAGNOSIS — D72829 Elevated white blood cell count, unspecified: Secondary | ICD-10-CM | POA: Insufficient documentation

## 2019-10-09 DIAGNOSIS — Z79899 Other long term (current) drug therapy: Secondary | ICD-10-CM | POA: Diagnosis not present

## 2019-10-09 DIAGNOSIS — R61 Generalized hyperhidrosis: Secondary | ICD-10-CM | POA: Diagnosis present

## 2019-10-09 DIAGNOSIS — G8929 Other chronic pain: Secondary | ICD-10-CM | POA: Insufficient documentation

## 2019-10-09 DIAGNOSIS — M542 Cervicalgia: Secondary | ICD-10-CM | POA: Insufficient documentation

## 2019-10-09 DIAGNOSIS — F17210 Nicotine dependence, cigarettes, uncomplicated: Secondary | ICD-10-CM | POA: Insufficient documentation

## 2019-10-09 LAB — COMPREHENSIVE METABOLIC PANEL
ALT: 32 U/L (ref 0–44)
ALT: 30 U/L (ref 0–44)
AST: 30 U/L (ref 15–41)
AST: 30 U/L (ref 15–41)
Albumin: 4.4 g/dL (ref 3.5–5.0)
Albumin: 4.5 g/dL (ref 3.5–5.0)
Alkaline Phosphatase: 63 U/L (ref 38–126)
Alkaline Phosphatase: 67 U/L (ref 38–126)
Anion gap: 12 (ref 5–15)
Anion gap: 17 — ABNORMAL HIGH (ref 5–15)
BUN: 14 mg/dL (ref 6–20)
BUN: 14 mg/dL (ref 6–20)
CO2: 22 mmol/L (ref 22–32)
CO2: 16 mmol/L — ABNORMAL LOW (ref 22–32)
Calcium: 10.2 mg/dL (ref 8.9–10.3)
Calcium: 10.2 mg/dL (ref 8.9–10.3)
Chloride: 105 mmol/L (ref 98–111)
Chloride: 106 mmol/L (ref 98–111)
Creatinine, Ser: 0.91 mg/dL (ref 0.61–1.24)
Creatinine, Ser: 0.94 mg/dL (ref 0.61–1.24)
GFR calc Af Amer: >60 mL/min (ref >60)
GFR calc Af Amer: >60 mL/min (ref >60)
GFR calc non Af Amer: >60 mL/min (ref >60)
GFR calc non Af Amer: >60 mL/min (ref >60)
Glucose, Bld: 110 mg/dL — ABNORMAL HIGH (ref 70–99)
Glucose, Bld: 118 mg/dL — ABNORMAL HIGH (ref 70–99)
Potassium: 4 mmol/L (ref 3.5–5.1)
Potassium: 4 mmol/L (ref 3.5–5.1)
Sodium: 139 mmol/L (ref 135–145)
Sodium: 139 mmol/L (ref 135–145)
Total Bilirubin: 0.7 mg/dL (ref 0.3–1.2)
Total Bilirubin: 0.9 mg/dL (ref 0.3–1.2)
Total Protein: 7.7 g/dL (ref 6.5–8.1)
Total Protein: 7.4 g/dL (ref 6.5–8.1)

## 2019-10-09 LAB — CBC WITH DIFFERENTIAL/PLATELET
Abs Immature Granulocytes: 0.11 10*3/uL — ABNORMAL HIGH (ref 0.00–0.07)
Abs Immature Granulocytes: 0.1 10*3/uL — ABNORMAL HIGH (ref 0.00–0.07)
Basophils Absolute: 0 10*3/uL (ref 0.0–0.1)
Basophils Absolute: 0 10*3/uL (ref 0.0–0.1)
Basophils Relative: 0 %
Basophils Relative: 0 %
Eosinophils Absolute: 0 10*3/uL (ref 0.0–0.5)
Eosinophils Absolute: 0 10*3/uL (ref 0.0–0.5)
Eosinophils Relative: 0 %
Eosinophils Relative: 0 %
HCT: 46.7 % (ref 39.0–52.0)
HCT: 47 % (ref 39.0–52.0)
Hemoglobin: 15.4 g/dL (ref 13.0–17.0)
Hemoglobin: 15.5 g/dL (ref 13.0–17.0)
Immature Granulocytes: 1 %
Immature Granulocytes: 1 %
Lymphocytes Relative: 12 %
Lymphocytes Relative: 18 %
Lymphs Abs: 2.2 10*3/uL (ref 0.7–4.0)
Lymphs Abs: 3.3 10*3/uL (ref 0.7–4.0)
MCH: 30.6 pg (ref 26.0–34.0)
MCH: 30.5 pg (ref 26.0–34.0)
MCHC: 33 g/dL (ref 30.0–36.0)
MCHC: 33 g/dL (ref 30.0–36.0)
MCV: 92.8 fL (ref 80.0–100.0)
MCV: 92.5 fL (ref 80.0–100.0)
Monocytes Absolute: 1 10*3/uL (ref 0.1–1.0)
Monocytes Absolute: 1 10*3/uL (ref 0.1–1.0)
Monocytes Relative: 5 %
Monocytes Relative: 5 %
Neutro Abs: 15.6 10*3/uL — ABNORMAL HIGH (ref 1.7–7.7)
Neutro Abs: 13.9 10*3/uL — ABNORMAL HIGH (ref 1.7–7.7)
Neutrophils Relative %: 82 %
Neutrophils Relative %: 76 %
Platelets: 274 10*3/uL (ref 150–400)
Platelets: 280 10*3/uL (ref 150–400)
RBC: 5.03 MIL/uL (ref 4.22–5.81)
RBC: 5.08 MIL/uL (ref 4.22–5.81)
RDW: 12.9 % (ref 11.5–15.5)
RDW: 12.9 % (ref 11.5–15.5)
WBC: 18.9 10*3/uL — ABNORMAL HIGH (ref 4.0–10.5)
WBC: 18.4 10*3/uL — ABNORMAL HIGH (ref 4.0–10.5)
nRBC: 0 % (ref 0.0–0.2)
nRBC: 0 % (ref 0.0–0.2)

## 2019-10-09 LAB — URINALYSIS, ROUTINE W REFLEX MICROSCOPIC
Bacteria, UA: NONE SEEN
Bilirubin Urine: NEGATIVE
Glucose, UA: NEGATIVE mg/dL
Ketones, ur: NEGATIVE mg/dL
Leukocytes,Ua: NEGATIVE
Nitrite: NEGATIVE
Protein, ur: NEGATIVE mg/dL
Specific Gravity, Urine: 1.009 (ref 1.005–1.030)
pH: 6 (ref 5.0–8.0)

## 2019-10-09 LAB — TSH: TSH: 0.601 u[IU]/mL (ref 0.350–4.500)

## 2019-10-09 MED ORDER — MAGNESIUM SULFATE IN D5W 1-5 GM/100ML-% IV SOLN
1.0000 g | Freq: Once | INTRAVENOUS | Status: AC
  Administered 2019-10-09: 1 g via INTRAVENOUS
  Filled 2019-10-09 (×2): qty 100

## 2019-10-09 MED ORDER — DOXYCYCLINE HYCLATE 100 MG PO TABS
100.0000 mg | ORAL_TABLET | Freq: Once | ORAL | Status: AC
  Administered 2019-10-09: 100 mg via ORAL
  Filled 2019-10-09: qty 1

## 2019-10-09 MED ORDER — METOCLOPRAMIDE HCL 5 MG/ML IJ SOLN
10.0000 mg | Freq: Once | INTRAMUSCULAR | Status: AC
  Administered 2019-10-09: 10 mg via INTRAVENOUS
  Filled 2019-10-09: qty 2

## 2019-10-09 MED ORDER — ACETAMINOPHEN 500 MG PO TABS
1000.0000 mg | ORAL_TABLET | Freq: Once | ORAL | Status: AC
  Administered 2019-10-09: 1000 mg via ORAL
  Filled 2019-10-09: qty 2

## 2019-10-09 MED ORDER — OXYCODONE-ACETAMINOPHEN 5-325 MG PO TABS: 1.0000 | ORAL_TABLET | Freq: Once | ORAL | Status: DC

## 2019-10-09 MED ORDER — SODIUM CHLORIDE 0.9 % IV BOLUS
1000.0000 mL | Freq: Once | INTRAVENOUS | Status: AC
  Administered 2019-10-09: 1000 mL via INTRAVENOUS

## 2019-10-09 MED ORDER — DIPHENHYDRAMINE HCL 50 MG/ML IJ SOLN
25.0000 mg | Freq: Once | INTRAMUSCULAR | Status: AC
  Administered 2019-10-09: 25 mg via INTRAVENOUS
  Filled 2019-10-09: qty 1

## 2019-10-09 MED ORDER — DOXYCYCLINE HYCLATE 100 MG PO CAPS: 100.0000 mg | ORAL_CAPSULE | Freq: Two times a day (BID) | ORAL | 0 refills | Status: DC

## 2019-10-09 NOTE — ED Triage Notes (Signed)
Pt reports multiple visits to ED for sweats, fever, shaking, and neck pain.  States he received C6 steriod injection at [facility] on Monday.  Seen in ED this morning and had elevated WBC of 18.9 that the PA suspected was related to steroid injection.  Pt states he had blood cultures obtained and was to follow-up with PCP.  Pt states [facility] called him this morning and told him to come to hospital because labs were a "lethal levels".  Blood cultures are still pending.

## 2019-10-09 NOTE — ED Provider Notes (Signed)
[facility] EMERGENCY DEPARTMENT Provider Note   CSN: [id number] Arrival date & time: 10/09/19  0340     History Chief Complaint  Patient presents with   Night Sweats    [name] is a 49 y.o. male.  49yo male with complaint of sweats that happen when lying supine for the past 3.5 weeks. Sweating resolves after sitting upright. Patient reports flushing of face with hot feeling in face and head when lying down, soaks bedding/hair and wakes up with chills. Unable to sleep due to this. Woke up this morning in a panic due to sweating, checked BP this morning when this happened 151/114 with elevated heart rate. Patient had thought the sweats could be due to the pain in his neck however neck pain has significantly improved since steroid injection yesterday morning.   Reports decrease in appetite, ?weight loss from drop in appetite.   Prior work accident in 2013 was holding a piece of 8'x6' subflooring, turned while holding and separated both SI joints that were later fused (Dr. Shon Baton at [facility]). Also L3-5 lumbar fusion around 2012 (Dr. Jillyn Hidden). 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  Stopped Tramadol yesterday at 4am suspecting this was causing the sweats.   Apt with PCP 10/12/19.   No history of Tb, daily smoker- 1 PPD, no history of HIV. Took Tylenol and Benadryl at 3AM. Also taking Zyrtec, Pepcid, hydroxyzine. 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        Past Medical History:  Diagnosis Date   Angio-edema    Anxiety    Headache    Lyme disease    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    Urticaria     Patient Active Problem List   Diagnosis Date Noted   Recurrent urticaria 04/30/2018   Allergic reaction 04/30/2018   Chronic rhinitis 04/30/2018   S/P lumbar fusion 11/15/2017    Past Surgical History:    Procedure Laterality Date   ADENOIDECTOMY     BACK SURGERY     HIP SURGERY     SACROILIAC JOINT FUSION Right 11/15/2017   Procedure: SACROILIAC JOINT FUSION;  Surgeon: Venita Lick, MD;  Location: [facility] OR;  Service: Orthopedics;  Laterality: Right;  120 mins   SACROILIAC JOINT FUSION Left 07/25/2018   Procedure: SACROILIAC JOINT FUSION;  Surgeon: Venita Lick, MD;  Location: [facility] OR;  Service: Orthopedics;  Laterality: Left;  SACROILIAC JOINT FUSION   TONSILLECTOMY     49 years old       No family history on file.  Social History   Tobacco Use   Smoking status: Current Every Day Smoker    Packs/day: 0.50    Years: 30.00    Pack years: 15.00    Types: Cigarettes   Smokeless tobacco: Never Used  Substance Use Topics   Alcohol use: Yes    Comment: stopped 2018   Drug use: No    Home Medications Prior to Admission medications   Medication Sig Start Date End Date Taking? Authorizing Provider  acetaminophen (TYLENOL) 325 MG tablet Take 325 mg by mouth daily as needed for mild pain.   Yes [provider]  ascorbic acid (VITAMIN C) 500 MG tablet Take 1,000 mg by mouth daily.   Yes [provider]  diphenhydrAMINE (BENADRYL) 25 MG tablet Take 25 mg by mouth 2 (two) times daily as needed for itching or  allergies.   Yes [provider]  famotidine (PEPCID) 20 MG tablet Take 1 tablet (20 mg total) by mouth 2 (two) times daily. 02/14/18  Yes Azalia Bilis, MD  hydrOXYzine (ATARAX/VISTARIL) 50 MG tablet Take 1 tablet (50 mg total) by mouth 3 (three) times daily as needed. Patient taking differently: Take 50 mg by mouth 3 (three) times daily as needed for anxiety or itching.  10/05/19  Yes Wallis Bamberg, PA-C  methocarbamol (ROBAXIN) 500 MG tablet Take 1 tablet (500 mg total) by mouth 2 (two) times daily. 10/05/19  Yes Mare Ferrari, PA-C  triamcinolone (NASACORT ALLERGY 24HR) 55 MCG/ACT AERO nasal inhaler Place 1 spray into the nose daily.   Yes [provider]  VITAMIN E PO Take 2 tablets by mouth daily.   Yes [provider]  Cetirizine HCl 10 MG CAPS Take 10 mg by mouth 2 (two) times daily.   10/05/19  [provider]  mupirocin nasal ointment (BACTROBAN) 2 % Apply to rash Patient not taking: Reported on 10/05/2019 11/11/18 10/05/19  Arlyn Dunning, PA-C    Allergies    Pregabalin, Sulfa antibiotics, and Gabapentin  Review of Systems   Review of Systems  Constitutional: Positive for appetite change, diaphoresis and unexpected weight change.  HENT: Positive for rhinorrhea and sneezing.   Respiratory: Positive for cough.   Gastrointestinal: Positive for diarrhea. Negative for abdominal pain, constipation, nausea and vomiting.       Minimal diarrhea   Genitourinary: Negative for difficulty urinating, dysuria, frequency and urgency.  Skin: Positive for color change. Negative for rash and wound.  Allergic/Immunologic: Negative for immunocompromised state.  All other systems reviewed and are negative.   Physical Exam Updated Vital Signs BP (!) 152/91    Pulse (!) 103    Temp 98.3 F (36.8 C) (Oral)    Resp 13    SpO2 100%   Physical Exam Vitals and nursing note reviewed.  Constitutional:      General: He is not in acute distress.    Appearance: He is well-developed. He is obese. He is not diaphoretic.  HENT:     Head: Normocephalic and atraumatic.     Mouth/Throat:     Mouth: Mucous membranes are dry.  Eyes:     Extraocular Movements: Extraocular movements intact.     Conjunctiva/sclera: Conjunctivae normal.     Pupils: Pupils are equal, round, and reactive to light.  Cardiovascular:     Rate and Rhythm: Normal rate and regular rhythm.     Pulses: Normal pulses.     Heart sounds: Normal heart sounds.  Pulmonary:     Effort: Pulmonary effort is normal.     Breath sounds: Normal breath sounds.  Abdominal:     Palpations: Abdomen is soft.     Tenderness: There is no abdominal tenderness.    Musculoskeletal:     Right lower leg: No edema.     Left lower leg: No edema.  Skin:    General: Skin is warm and dry.     Capillary Refill: Capillary refill takes less than 2 seconds.     Findings: No erythema or rash.  Neurological:     Mental Status: He is alert and oriented to person, place, and time.  Psychiatric:        Behavior: Behavior normal.     ED Results / Procedures / Treatments   Labs (all labs ordered are listed, but only abnormal results are displayed) Labs Reviewed  COMPREHENSIVE METABOLIC  PANEL - Abnormal; Notable for the following components:      Result Value   Glucose, Bld 110 (*)    All other components within normal limits  CBC WITH DIFFERENTIAL/PLATELET - Abnormal; Notable for the following components:   WBC 18.9 (*)    Neutro Abs 15.6 (*)    Abs Immature Granulocytes 0.11 (*)    All other components within normal limits  URINALYSIS, ROUTINE W REFLEX MICROSCOPIC - Abnormal; Notable for the following components:   Color, Urine STRAW (*)    Hgb urine dipstick SMALL (*)    All other components within normal limits  CULTURE, BLOOD (ROUTINE X 2)  CULTURE, BLOOD (ROUTINE X 2)  TSH    EKG None  Radiology DG Chest 2 View  Result Date: 10/09/2019 CLINICAL DATA:  Night sweats EXAM: CHEST - 2 VIEW COMPARISON:  02/03/2018 FINDINGS: Normal heart size and mediastinal contours. No acute infiltrate or edema. No effusion or pneumothorax. No acute osseous findings. IMPRESSION: Negative chest. Electronically Signed   By: Marnee Spring M.D.   On: 10/09/2019 04:47    Procedures Procedures (including critical care time)  Medications Ordered in ED Medications - No data to display  ED Course  I have reviewed the triage vital signs and the nursing notes.  Pertinent labs & imaging results that were available during my care of the patient were reviewed by me and considered in my medical decision making (see chart for details).  Clinical Course as of Oct 09 554  Wed Oct 09, 2019  0406 130/82 BP lying supine, HR 93   [LM]  0415 118/84, HR 89, supine   [LM]  0434 Records reviewed in care everywhere from prior rheumatology and ID follow up from hospital admission for Lyme.    [LM]  [phone] 49yo male presents with complaint of sweats that occur while lying supine for the past 3.5 weeks and have resulted in loss of appetite with subsequent unmeasured weight loss and insomnia.  Patient is taking medication recommended by UC for possible allergic reaction. Has also stopped his Tramadol concerned that this caused his symptoms as symptoms started 3.5 weeks ago when he started the Tramadol. For this reason, doubt withdrawal as the cause but could add to cause as patient has abruptly stopped his Tramadol. Patient is concerned he could have a systemic infection due to history of chronic Lyme. Patient was observed in the room for 20 minutes lying supine, skin remained dry and vitals reassuring as documented above. CXR is normal, doubt chest mass/PE. Plan is to send labs including CBC, CMP, TSH, UA and blood cultures. Initial presentation is reassuring.   [LM]  0555 CBC with elevated WBC to 18.9, suspect this is due to his recent steroid injection, advised patient to follow up on his blood cultures, consider repeat with PCP on Friday at scheduled appointment. CMP without significant findings. UA with small hgb, unchanged from prior. Discussed results as well as pending blood cultures and TSH. Plan is to follow up with PCP, patient is agreeable with plan of care.   [LM]    Clinical Course User Index [LM] Alden Hipp   MDM Rules/Calculators/A&P                      Final Clinical Impression(s) / ED Diagnoses Final diagnoses:  Night sweats  Leukocytosis, unspecified type    Rx / DC Orders ED Discharge Orders    None  Tacy Learn, PA-C 10/09/19 [place], [place], DO 10/09/19 [phone]

## 2019-10-09 NOTE — Discharge Instructions (Signed)
As discussed, it is very important that you keep your appointment tomorrow with your physician.  If you develop new or concerning changes in the interim, please be sure to return here. Otherwise, please be sure to discuss your evaluation today, your visit with the orthopedist, and your labs.  It is important that you discuss repeat labs and their utility in terms of evaluating your current condition.

## 2019-10-09 NOTE — ED Triage Notes (Signed)
Pt reports that he has night sweats and insomnia, seen several times for the same.

## 2019-10-09 NOTE — ED Notes (Signed)
Pt highly anxious and feeling as if he has a "heat flash".  Pa notified.

## 2019-10-09 NOTE — ED Notes (Signed)
Patient transported to CT 

## 2019-10-09 NOTE — ED Provider Notes (Signed)
[facility] EMERGENCY DEPARTMENT Provider Note   CSN: [id number] Arrival date & time: 10/09/19  1028     History Chief Complaint  Patient presents with  . Neck Pain  . Fever    [name] is a 49 y.o. 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  Onset 4 to 5 weeks ago.  This is his third ED visit in the last 24 hours for symptoms.  Has tried melatonin and hydroxyzine for insomnia without relief.  States in the last 3 days he has only slept 4 hours.  States all of his symptoms start when he tries to lay down to sleep and soon after he wakes up.  He is concerned that he has Lyme disease infection in his spinal cord.  He was seen in the ER 6 hours ago and blood work was done.  WBC 18.9, screening labs otherwise normal.  Blood cultures and TSH pending.  COVID test was negative.  Patient states his pain doctor Dr Nelva Bush from [facility] called him and told him to come back to the ER because his lab values were "lethal".  He is concerned about his white blood cell count elevation and that this could be from an infection in his body. 1 year ago he had a work accident in which he injured his SI joints and neck, these are being managed by [facility].  He had a steroid injection for pain management on 5/10 and his cervical spine and he states his pain has significantly improved and now is a constant 3/10. He denies head trauma or injury. He denies IVDU.  Last temporal fever last night 101.9. Last took tylenol at 0400.   HPI     Past Medical History:  Diagnosis Date  . Angio-edema   . Anxiety   .  Headache   . Lyme disease   . [facility] spotted fever   . Urticaria     Patient Active Problem List   Diagnosis Date Noted  . Recurrent urticaria 04/30/2018  . Allergic reaction 04/30/2018  . Chronic rhinitis 04/30/2018  . S/P lumbar fusion 11/15/2017    Past Surgical History:  Procedure Laterality Date  . ADENOIDECTOMY    . BACK SURGERY    . HIP SURGERY    . SACROILIAC JOINT FUSION Right 11/15/2017   Procedure: SACROILIAC JOINT FUSION;  Surgeon: Melina Schools, MD;  Location: [place];  Service: Orthopedics;  Laterality: Right;  120 mins  . SACROILIAC JOINT FUSION Left 07/25/2018   Procedure: SACROILIAC JOINT FUSION;  Surgeon: Melina Schools, MD;  Location: [place];  Service: Orthopedics;  Laterality: Left;  SACROILIAC JOINT FUSION  . TONSILLECTOMY     49 years old       No family history on file.  Social History   Tobacco Use  . Smoking status: Current Every Day Smoker    Packs/day: 0.50    Years: 30.00    Pack years: 15.00    Types: Cigarettes  . Smokeless tobacco: Never Used  Substance Use Topics  . Alcohol use: Yes    Comment: stopped 2018  . Drug use: No    Home Medications Prior to Admission medications  Medication Sig Start Date End Date Taking? Authorizing Provider  acetaminophen (TYLENOL) 325 MG tablet Take 325 mg by mouth daily as needed for mild pain.    [provider]  ascorbic acid (VITAMIN C) 500 MG tablet Take 1,000 mg by mouth daily.    [provider]  diphenhydrAMINE (BENADRYL) 25 MG tablet Take 25 mg by mouth 2 (two) times daily as needed for itching or allergies.    [provider]  famotidine (PEPCID) 20 MG tablet Take 1 tablet (20 mg total) by mouth 2 (two) times daily. 02/14/18   Azalia Bilis, MD  hydrOXYzine (ATARAX/VISTARIL) 50 MG tablet Take 1 tablet (50 mg total) by mouth 3 (three) times daily as needed. Patient taking differently: Take 50 mg by mouth 3 (three) times daily as needed for anxiety or itching.   10/05/19   Wallis Bamberg, PA-C  methocarbamol (ROBAXIN) 500 MG tablet Take 1 tablet (500 mg total) by mouth 2 (two) times daily. 10/05/19   Mare Ferrari, PA-C  triamcinolone (NASACORT ALLERGY 24HR) 55 MCG/ACT AERO nasal inhaler Place 1 spray into the nose daily.    [provider]  VITAMIN E PO Take 2 tablets by mouth daily.    [provider]  Cetirizine HCl 10 MG CAPS Take 10 mg by mouth 2 (two) times daily.   10/05/19  [provider]  mupirocin nasal ointment (BACTROBAN) 2 % Apply to rash Patient not taking: Reported on 10/05/2019 11/11/18 10/05/19  Arlyn Dunning, PA-C    Allergies    Pregabalin, Sulfa antibiotics, and Gabapentin  Review of Systems   Review of Systems  Constitutional: Positive for chills and diaphoresis.  HENT:       Scalp pain  Gastrointestinal: Positive for nausea and vomiting.  Musculoskeletal: Positive for arthralgias (left shoulder, LLE), neck pain (chronic, improved) and neck stiffness (chronic, improved).  Neurological: Positive for light-headedness and headaches.  Psychiatric/Behavioral: Positive for confusion.  All other systems reviewed and are negative.   Physical Exam Updated Vital Signs BP 120/80   Pulse 86   Temp 98.3 F (36.8 C) (Oral)   Resp (!) 23   SpO2 100%   Physical Exam Vitals and nursing note reviewed.  Constitutional:      Appearance: He is well-developed.     Comments: NAD. Appears anxious.   HENT:     Head: Normocephalic and atraumatic.     Comments: Top of scalp is tender    Right Ear: External ear normal.     Left Ear: External ear normal.     Nose: Nose normal.  Eyes:     General: No scleral icterus.    Conjunctiva/sclera: Conjunctivae normal.  Neck:     Comments: Diffuse bilateral and midline tenderness, worse at base of occiput.  Diminished neck flexion due to pain although patient states this is chronic and improved since c-spine steroid injection 2 days ago Cardiovascular:     Rate and Rhythm:  Normal rate and regular rhythm.     Heart sounds: Normal heart sounds. No murmur.  Pulmonary:     Effort: Pulmonary effort is normal.     Breath sounds: Normal breath sounds. No wheezing.  Musculoskeletal:        General: No deformity. Normal range of motion.     Cervical back: Normal range of motion and neck supple. Tenderness present.  Skin:    General: Skin is warm and dry.     Capillary Refill: Capillary refill takes less than 2 seconds.  Neurological:     Mental Status: He is alert and oriented to person, place, and time.     Comments: Alert and oriented to self, place, time and event.  Speech is fluent without dysarthria or dysphasia. Strength 5/5 with hand grip and ankle F/E.   Sensation to light touch intact in face, hands and feet. Sits on side of the bed without truncal sway No pronator drift. No leg drop. Normal finger-to-nose and feet tapping.  CN I not tested CN II grossly intact visual fields bilaterally. Unable to visualize posterior eye. CN Chen, IV, VI PEERL and EOMs intact bilaterally CN V light touch intact in all 3 divisions of trigeminal nerve CN VII facial movements symmetric CN VIII not tested CN IX, X no uvula deviation, symmetric rise of soft palate  CN XI 5/5 SCM and trapezius strength bilaterally  CN XII Midline tongue protrusion, symmetric L/R movements  Psychiatric:        Behavior: Behavior normal.        Thought Content: Thought content normal.        Judgment: Judgment normal.     ED Results / Procedures / Treatments   Labs (all labs ordered are listed, but only abnormal results are displayed) Labs Reviewed  CBC WITH DIFFERENTIAL/PLATELET - Abnormal; Notable for the following components:      Result Value   WBC 18.4 (*)    Neutro Abs 13.9 (*)    Abs Immature Granulocytes 0.10 (*)    All other components within normal limits  COMPREHENSIVE METABOLIC PANEL - Abnormal; Notable for the following components:   CO2 16 (*)    Glucose, Bld 118  (*)    Anion gap 17 (*)    All other components within normal limits    EKG None  Radiology DG Chest 2 View  Result Date: 10/09/2019 CLINICAL DATA:  Night sweats EXAM: CHEST - 2 VIEW COMPARISON:  02/03/2018 FINDINGS: Normal heart size and mediastinal contours. No acute infiltrate or edema. No effusion or pneumothorax. No acute osseous findings. IMPRESSION: Negative chest. Electronically Signed   By: Marnee Spring M.D.   On: 10/09/2019 04:47    Procedures Procedures (including critical care time)  Medications Ordered in ED Medications  sodium chloride 0.9 % bolus 1,000 mL (has no administration in time range)  metoCLOPramide (REGLAN) injection 10 mg (10 mg Intravenous Given 10/09/19 1346)  magnesium sulfate IVPB 1 g 100 mL (1 g Intravenous New Bag/Given 10/09/19 1424)  acetaminophen (TYLENOL) tablet 1,000 mg (1,000 mg Oral Given 10/09/19 1346)  diphenhydrAMINE (BENADRYL) injection 25 mg (25 mg Intravenous Given 10/09/19 1425)    ED Course  I have reviewed the triage vital signs and the nursing notes.  Pertinent labs & imaging results that were available during my care of the patient were reviewed by me and considered in my medical decision making (see chart for details).  Clinical Course as of Oct 09 1550  Wed Oct 09, 2019  1329 Consulted [facility] given patient report Dr Ethelene Hal had advised him to come to ER.  Dr Rennis Chris called back, will try to contact Dr Ethelene Hal for more details.    [CG]  1330 Patient complaining of pounding headache on top of scalp. Will give migraine cocktail, reassess. Delay in CT.   [CG]  1526 Called CT again, patient has one patient ahead of him.    [CG]    Clinical Course User Index [CG] Jerrell Mylar   MDM Rules/Calculators/A&P  Patient's EMR reviewed to assist with MDM.    [facility] ID note 01/2017 reviewed. Patient had tick exposure in 2018 followed with rash, arthralgias, headaches, fever, chills.  He was partially  treated with doxycycline as OP prior to admission and work up.  He had negative rheumatology work up.  He had extensive work up including MRI revealing negative Lyme disease and slightly positive RMSF serologies. ID recommended IV ceftriaxone via PICC line.   Patient here with 4-5 weeks of headaches, sweats, chills, nausea, vomiting, visual disturbances, and confusion that he describes as "not know what is going on when I wake up", insomnia. Temporal fever 101.9 last night. He is concerned about "lyme disease in my spinal cord".  Had c-spine steroid injection 5/10 and neck pain and left arm pain has significantly improved since.    1330: Called CT given 1.5 hr delay, several patient ahead of patient awaiting CT. Migraine cocktail ordered.   1545: Spoke [facility] Dr Rennis Chris who clarifies ortho PA advised patient that his complaints are unlikely to be related to orthopedic cause and advised patient to seek medical help in ER.    1548: patient given migraine cocktail for ongoing scalp/head pain.  Significant delay in CT scan.  Patient will be handed off to oncoming EDPA and shift change.  Previous history of suspected disseminated lyme disease, worsening headaches, chills and reported confusion concern for infectious process including meningitis or discitis.  Last reported fever 101.9 last night, no fever here.    Plan: follow up on head CT, reassess. Consider lumbar puncture and MRI cervical spine to evaluate for infectious process or discitis.  Patient updated. Patient and ER POC discussed with EDP [place].   Final Clinical Impression(s) / ED Diagnoses Final diagnoses:  Chronic neck pain    Rx / DC Orders ED Discharge Orders    None       Liberty Handy, PA-C 10/09/19 1552    Gwyneth Sprout, MD 10/10/19 1003

## 2019-10-09 NOTE — ED Provider Notes (Signed)
1630: I discussed findings thus far with the patient, and his wife.  On specifically discussed generally reassuring labs, with notation of his leukocytosis which is slightly diminished from earlier in the day.  In addition, his absolute immature granulocyte count is also decreasing.  Absent fever here, there is lower suspicion for infection, higher suspicion for post steroid abnormalities. Throughout our conversation the patient is moving his head freely, in all dimensions, without any apparent limitation, or report of pain.  We discussed this physical exam finding in the context of concern for his reported fever, ongoing neck pain. We discussed utility of lumbar puncture versus close outpatient follow-up. Both the patient and his wife voiced preference for avoiding additional procedures, comfort with next a follow-up for further evaluation of his ongoing symptoms.  Per request patient will start a short course of doxy as well.   Garrett Munch, MD 10/09/19 1752

## 2019-10-10 ENCOUNTER — Encounter: Payer: Self-pay | Admitting: Student in an Organized Health Care Education/Training Program

## 2019-10-10 ENCOUNTER — Ambulatory Visit: Payer: 59 | Admitting: Student in an Organized Health Care Education/Training Program

## 2019-10-10 ENCOUNTER — Other Ambulatory Visit: Payer: Self-pay

## 2019-10-10 VITALS — BP 122/90 | HR 81 | Ht 70.0 in | Wt 234.6 lb

## 2019-10-10 DIAGNOSIS — D72828 Other elevated white blood cell count: Secondary | ICD-10-CM

## 2019-10-10 DIAGNOSIS — Z720 Tobacco use: Secondary | ICD-10-CM | POA: Diagnosis not present

## 2019-10-10 DIAGNOSIS — Z13220 Encounter for screening for lipoid disorders: Secondary | ICD-10-CM | POA: Diagnosis not present

## 2019-10-10 DIAGNOSIS — Z7689 Persons encountering health services in other specified circumstances: Secondary | ICD-10-CM

## 2019-10-10 DIAGNOSIS — E782 Mixed hyperlipidemia: Secondary | ICD-10-CM

## 2019-10-10 DIAGNOSIS — A6920 Lyme disease, unspecified: Secondary | ICD-10-CM

## 2019-10-10 NOTE — Patient Instructions (Signed)
It was a pleasure to see you today!  To summarize our discussion for this visit:  We will check some labs today as a screening and follow up from your emergency room visit  Please come back to see me in about 2 weeks to follow up on your treatment of Lymes disease. I will look into it before then.   Some additional health maintenance measures we should update are: Health Maintenance Due  Topic Date Due  . COVID-19 Vaccine (1) Never done  . TETANUS/TDAP  Never done  .    Call the clinic at [phone] if your symptoms worsen or you have any concerns.   Thank you for allowing me to take part in your care,  Dr. Jamelle Rushing

## 2019-10-10 NOTE — Progress Notes (Signed)
    SUBJECTIVE:   CHIEF COMPLAINT / HPI: establish care  Establishing care:  Despite his recent onset of illness, patient states that he is doing overall well today. Endorses a good night sleep last night for the first time in weeks.   Past medical history positive for Lyme's disease and [facility] spotted fever.  Was seen in ED this week for vague symptoms and was suspected of having flareup of Lyme's disease from steroid injections.  Patient was started on doxycycline per request and declined LP.  He also suffers from seasonal allergies which are well controlled with over-the-counter medications. No other known PMH.   Past surgical history positive for cervical and sacral fusions. Continues to receive care from ortho for epidural steroid injections  Denies alcohol or illicit drug use.  Positive tobacco use of approximately a pack per day for the past 30 years.  He makes his own cigarettes with filter at home.  Allergies to sulfa-based antibiotics creates anaphylaxis.  Allergy to gabapentin and Lyrica creates sweats, hives, nausea and pancreatitis.  Mother had unknown cancer and father had heart disease requiring pacemaker. Older sister died from Lyme's disease.  OBJECTIVE:   BP 122/90   Pulse 81   Ht 5\' 10"  (1.778 m)   Wt 234 lb 9.6 oz (106.4 kg)   SpO2 98%   BMI 33.66 kg/m   General: NAD, pleasant, able to participate in exam HEENT: Significant for bilateral anterior cervical and submandibular lymphadenitis tender to palpation.  Thyroid normal. Oropharynx negative for erythema, edema, exudates Cardiac: RRR, normal heart sounds, no murmurs. 2+ radial and PT pulses bilaterally Respiratory: CTAB, normal effort, No wheezes, rales or rhonchi Abdomen: soft, nontender, nondistended, no hepatic or splenomegaly, +BS Extremities: no edema or cyanosis. WWP. Skin: warm and dry, no rashes noted Neuro: alert and oriented x4, no focal deficits Psych: Normal affect and  mood  ASSESSMENT/PLAN:   Tobacco use 30ppd history with home-made cigarettes. Would recommend screening LDCT chest starting age 65.  Encounter to establish care Reviewed medications and ED visit.  Ordered lipid panel and  CBC to monitor WBC level per patient request.  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  Was prescribed doxycycline from ED and endorses symptom improvement.  Patient requested longer refill. In my review of treatment, I do not see much utility in repeat antibiotic course so decline to refill at this time.  Will follow up closely to monitor symptoms  Hyperlipemia Lipid panel abnormal.  Will recommend initiation of statin as well as lifestyle modifications.      49, DO [facility]

## 2019-10-11 DIAGNOSIS — Z7689 Persons encountering health services in other specified circumstances: Secondary | ICD-10-CM | POA: Insufficient documentation

## 2019-10-11 DIAGNOSIS — A6920 Lyme disease, unspecified: Secondary | ICD-10-CM | POA: Insufficient documentation

## 2019-10-11 DIAGNOSIS — Z720 Tobacco use: Secondary | ICD-10-CM | POA: Insufficient documentation

## 2019-10-11 DIAGNOSIS — E785 Hyperlipidemia, unspecified: Secondary | ICD-10-CM | POA: Insufficient documentation

## 2019-10-11 LAB — LIPID PANEL
Chol/HDL Ratio: 9.4 ratio — ABNORMAL HIGH (ref 0.0–5.0)
Cholesterol, Total: 262 mg/dL — ABNORMAL HIGH (ref 100–199)
HDL: 28 mg/dL — ABNORMAL LOW (ref >39)
LDL Chol Calc (NIH): 171 mg/dL — ABNORMAL HIGH (ref 0–99)
Triglycerides: 324 mg/dL — ABNORMAL HIGH (ref 0–149)
VLDL Cholesterol Cal: 63 mg/dL — ABNORMAL HIGH (ref 5–40)

## 2019-10-11 LAB — CBC
Hematocrit: 46.2 % (ref 37.5–51.0)
Hemoglobin: 15.6 g/dL (ref 13.0–17.7)
MCH: 30.2 pg (ref 26.6–33.0)
MCHC: 33.8 g/dL (ref 31.5–35.7)
MCV: 89 fL (ref 79–97)
Platelets: 259 10*3/uL (ref 150–450)
RBC: 5.17 x10E6/uL (ref 4.14–5.80)
RDW: 12.5 % (ref 11.6–15.4)
WBC: 12.9 10*3/uL — ABNORMAL HIGH (ref 3.4–10.8)

## 2019-10-11 NOTE — Assessment & Plan Note (Signed)
Reviewed medications and ED visit.  Ordered lipid panel and  CBC to monitor WBC level per patient request.

## 2019-10-11 NOTE — Assessment & Plan Note (Signed)
Lipid panel abnormal.  Will recommend initiation of statin as well as lifestyle modifications.

## 2019-10-11 NOTE — Assessment & Plan Note (Signed)
30ppd history with home-made cigarettes. Would recommend screening LDCT chest starting age 48.

## 2019-10-11 NOTE — Assessment & Plan Note (Signed)
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  Was prescribed doxycycline from ED and endorses symptom improvement.  Patient requested longer refill. In my review of treatment, I do not see much utility in repeat antibiotic course so decline to refill at this time.  Will follow up closely to monitor symptoms

## 2019-10-14 LAB — CULTURE, BLOOD (ROUTINE X 2)
Culture: NO GROWTH
Culture: NO GROWTH

## 2019-10-16 ENCOUNTER — Telehealth: Payer: Self-pay | Admitting: Family Medicine

## 2019-10-16 ENCOUNTER — Encounter: Payer: Self-pay | Admitting: Family Medicine

## 2019-10-16 NOTE — Telephone Encounter (Signed)
**  After Hours/ Emergency Line Call**  Received a page to call [phone] - [phone].  Patient: [name]  Caller: Self  Confirmed name & DOB of patient with caller  Subjective:  Patient calling after-hours line as he is experiencing fevers.  Patient reports that he had a tooth pulled earlier today.  The tooth was not infected but did have a significant crack.  Patient reports the procedure took about an hour and a half and a back molar was taken out.  He reports he left the dentist around 1030 this morning and took an 800 mg ibuprofen that he is supposed to be taking every 6 hours.  He has not taken another ibuprofen.   He reports that the right side of his head is 102 Fahrenheit using transcutaneous thermometer and 99.6 Fahrenheit on the left side.  He does report feeling some chills, upset stomach.  Patient reports that his blood pressures have been within normal limits.  He is not experiencing any dizziness, syncope.  He reports that he feels nauseous when he lies down but that improves with sitting up.  He denies any severe pain at the site of extraction.  He reports mild bleeding which was to be expected. He is calling because he is worried that he  is having an issuewith his Lyme disease.  Patient was diagnosed with disseminated Lyme disease stage Chen in 2018 and had [facility] spotted fever.  Patient was hospitalized and discharged on 90 days of antibiotics.  He was recently seen in the ED and by Dr. 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  In the ED, he was started on doxycycline and did endorse some symptom improvement.  He denies any onset of rashes, chest pain, difficulty breathing.  He does report that he has some mild abdominal pain around the site of his pancreas.  Objective:  Observations: NAD speaking in full sentences, no respiratory distress   Assessment & Plan  [name] is a 49 y.o. male with PMHx of Lyme disease and  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 who calls with the following complaints and concerns: New onset fever unilaterally after tooth extraction today.    Patient's unilateral fever is likely secondary to tooth extraction earlier in the day.  It is unlikely that he is experiencing an acute infection due to tooth extraction.  He has not missed any doses of his doxycycline; however, it is questionable whether he needed these antibiotics in the first place.  Patient given return precautions for the emergency department.  This includes increased pain at extraction site, worsening fever, chills, nausea, vomiting, low blood pressures, syncope. Also recommended patient to continue taking ibuprofen every 6 hours as this should help with the swelling and the inflammatory response. Patient to call the after-hours line if he has any further questions this evening.   -- Red flags discussed.   -- Will forward to PCP.  Melene Plan, M.D.  PGY-2  [facility] 10/16/2019 6:53 PM

## 2019-10-18 ENCOUNTER — Ambulatory Visit: Payer: 59 | Admitting: Family Medicine

## 2019-10-18 ENCOUNTER — Other Ambulatory Visit: Payer: Self-pay

## 2019-10-18 ENCOUNTER — Encounter: Payer: Self-pay | Admitting: Family Medicine

## 2019-10-18 VITALS — BP 127/80 | HR 96 | Ht 72.0 in | Wt 232.0 lb

## 2019-10-18 DIAGNOSIS — E782 Mixed hyperlipidemia: Secondary | ICD-10-CM

## 2019-10-18 DIAGNOSIS — A6920 Lyme disease, unspecified: Secondary | ICD-10-CM

## 2019-10-18 MED ORDER — ATORVASTATIN CALCIUM 40 MG PO TABS
40.0000 mg | ORAL_TABLET | Freq: Every day | ORAL | 3 refills | Status: DC
Start: 2019-10-18 — End: 2019-10-18

## 2019-10-18 MED ORDER — ATORVASTATIN CALCIUM 20 MG PO TABS
20.0000 mg | ORAL_TABLET | Freq: Every day | ORAL | 3 refills | Status: DC
Start: 2019-10-18 — End: 2019-10-21

## 2019-10-18 NOTE — Assessment & Plan Note (Signed)
No Hx of CVD or Diabetes. ASCVD risk calculator puts him at a 5-7.5% risk of CVD event in 10 years. Moderate intensity statin recommended. - Starting Lipitor 20mg  daily

## 2019-10-18 NOTE — Patient Instructions (Signed)
Patient declined  

## 2019-10-18 NOTE — Progress Notes (Signed)
    SUBJECTIVE:   CHIEF COMPLAINT / HPI:   Follow Up for suspected acute flare of Lyme/RMSF Patient is an established patient here at [facility] but new to me. 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 He subsequently was seen by a Rheumatologist in [place] but had a change in insurance companies and subsequently has not been back or been followed by Rheumatology since. He thinks his most recent flare was due to a steroid injection for his neck pain that occurred several weeks ago. He was seen in the ED and started on a course of doxycycline and today states he feels "much better". He has resolution for his night sweats, fever, chills. His arthralgias in his elbows and knees are still present but much improved from last week. He has been avoiding red meat and has no issues with nausea or vomiting.  HLD Elevated at last check and not on a statin. No history of CVD or T2DM.    PERTINENT  PMH / PSH: Lyme disease, RMSF, HLD  OBJECTIVE:   BP 127/80   Pulse 96   Ht 6' (1.829 m)   Wt 232 lb (105.2 kg)   SpO2 98%   BMI 31.46 kg/m   Gen: NAD Cardio: RRR, no murmurs Resp: CTAB no crackles MSK/Skin: no rashes, no erythema of the elbow or knee joints no warmth or TTP  ASSESSMENT/PLAN:   Lyme disease Improving on doxycycline and he states he has two days of pills left. He will finish course and then stop medication as his symptoms have improved. No red flag symptoms such as chest pain, shortness of breath, difficulty breathing. - I suggested if he had another flare this year to refer him to Rheumatology just to have their input to see if they would recommend any preventive medications/life style changes to limit flares  Hyperlipemia No Hx of CVD or Diabetes. ASCVD risk calculator puts him at a 5-7.5% risk of CVD event in 10 years. Moderate intensity statin recommended. -  Starting Lipitor 20mg  daily     , DO [facility]

## 2019-10-18 NOTE — Assessment & Plan Note (Signed)
Improving on doxycycline and he states he has two days of pills left. He will finish course and then stop medication as his symptoms have improved. No red flag symptoms such as chest pain, shortness of breath, difficulty breathing. - I suggested if he had another flare this year to refer him to Rheumatology just to have their input to see if they would recommend any preventive medications/life style changes to limit flares

## 2019-10-20 ENCOUNTER — Encounter: Payer: Self-pay | Admitting: Family Medicine

## 2019-10-21 ENCOUNTER — Other Ambulatory Visit: Payer: Self-pay | Admitting: Family Medicine

## 2019-10-21 ENCOUNTER — Encounter: Payer: Self-pay | Admitting: Family Medicine

## 2019-10-21 MED ORDER — ROSUVASTATIN CALCIUM 10 MG PO TABS: 10.0000 mg | ORAL_TABLET | Freq: Every day | ORAL | 2 refills | Status: DC

## 2019-10-21 NOTE — Progress Notes (Signed)
Patient states he thinks Lipitor made him have insomina and a sore throat but no difficulty breathing no swollen lips, no angioedema.  Will try Crestor at moderate intensity

## 2019-10-21 NOTE — Telephone Encounter (Signed)
This encounter was created in error - please disregard.

## 2019-10-23 ENCOUNTER — Telehealth: Payer: Self-pay | Admitting: Student in an Organized Health Care Education/Training Program

## 2019-10-23 ENCOUNTER — Other Ambulatory Visit: Payer: Self-pay

## 2019-10-23 ENCOUNTER — Ambulatory Visit: Payer: 59 | Admitting: Family Medicine

## 2019-10-23 ENCOUNTER — Encounter: Payer: Self-pay | Admitting: Family Medicine

## 2019-10-23 VITALS — BP 121/70 | HR 100 | Temp 98.9°F | Ht 72.0 in | Wt 232.6 lb

## 2019-10-23 DIAGNOSIS — K0889 Other specified disorders of teeth and supporting structures: Secondary | ICD-10-CM | POA: Diagnosis not present

## 2019-10-23 NOTE — Telephone Encounter (Signed)
**  After Hours/ Emergency Line Call*  CC: can't sleep. 3 hours total last night. 2 hours the previous 2 nights before that.  Same sleep patterns he had prior to starting recent doxycyline recently which had relieved his symptoms almost entirely and he was getting 5-6 ours of sleep per night. Thinks it is Lymes again.  Slight headache and dizziness, nausea. No vomiting. Also feel that he is having night sweats/ fevers. Checks his temperature and has elevated temperature on the temple that is on the pillow (nothing >100.0 and a normal on the other temple, per patient. This insomnia is also causing his chronic neck and back pain to act up. When assessing for emergent symptoms, ROS revealed some possible illusions (shadows moving out of corner of eye). Denies SI/HI, auditory hallucinations.   He is able to fall asleep but cannot stay asleep for longer than about 30 minutes at a time.    O: Patient is jovial in our encounter. No conversational dyspnea.   A/P: - made patient earliest appointment in clinic to discuss further with Dr. Parke Simmers. Do not feel that an ED presentation in needed at this time. - would STRONGLY recommend AGAINST further antibiotic tx for patient suspected Lyme disease - recommend sleep hygiene counseling, melatonin 5mg  QHS, consider non-habit forming sleep agent such as trazodone - consider referral to sleep study based on history provided

## 2019-10-23 NOTE — Progress Notes (Signed)
    SUBJECTIVE:   CHIEF COMPLAINT / HPI:   Pain, dental Patient comes in complaining of left-sided dental pain.  He said that he had a procedure for a impacted molar on the rear left mandible.  He said this was 5 days ago.  He said that since then the pain is become such that he has difficulty sleeping on that side.  He has not called the dentist back.  He has not had any respiratory issues, he has been able to eat  PERTINENT  PMH / PSH:   OBJECTIVE:   BP 121/70   Pulse 100   Temp 98.9 F (37.2 C)   Ht 6' (1.829 m)   Wt 232 lb 9.6 oz (105.5 kg)   SpO2 99%   BMI 31.55 kg/m   General: Alert and pleasant, talkative with no speech impact Oral exam: No palpable submandibular lymphadenopathy, no Ludwig angina, no obvious abscess around the site of the tooth removal at the rear left mandible.  Uvula is midline with no obvious tonsillar swelling, there is no stridor, no drooling, full range of motion of the neck  ASSESSMENT/PLAN:   Pain, dental Patient comes in complaining of left-sided dental pain.  He said that he had a procedure for a impacted molar on the rear left mandible.  He said this was 5 days ago.  He said that since then the pain is become such that he has difficulty sleeping on that side.  He has not called the dentist back.  Discussed red flag precautions, physical exam today does not indicate airway compromise or urgent surgical requirement.  Advised patient to follow-up with his dentist which he says he is comfortable doing     Marthenia Rolling, DO [facility]

## 2019-10-26 DIAGNOSIS — K0889 Other specified disorders of teeth and supporting structures: Secondary | ICD-10-CM | POA: Insufficient documentation

## 2019-10-26 NOTE — Assessment & Plan Note (Signed)
Patient comes in complaining of left-sided dental pain.  He said that he had a procedure for a impacted molar on the rear left mandible.  He said this was 5 days ago.  He said that since then the pain is become such that he has difficulty sleeping on that side.  He has not called the dentist back.  Discussed red flag precautions, physical exam today does not indicate airway compromise or urgent surgical requirement.  Advised patient to follow-up with his dentist which he says he is comfortable doing

## 2019-11-25 ENCOUNTER — Ambulatory Visit: Payer: 59 | Admitting: Student in an Organized Health Care Education/Training Program

## 2019-11-25 ENCOUNTER — Other Ambulatory Visit: Payer: Self-pay

## 2019-11-25 VITALS — BP 123/80 | HR 82 | Ht 72.0 in | Wt 232.2 lb

## 2019-11-25 DIAGNOSIS — E559 Vitamin D deficiency, unspecified: Secondary | ICD-10-CM

## 2019-11-25 DIAGNOSIS — R5382 Chronic fatigue, unspecified: Secondary | ICD-10-CM

## 2019-11-25 DIAGNOSIS — R0609 Other forms of dyspnea: Secondary | ICD-10-CM

## 2019-11-25 DIAGNOSIS — R0600 Dyspnea, unspecified: Secondary | ICD-10-CM

## 2019-11-25 DIAGNOSIS — Z131 Encounter for screening for diabetes mellitus: Secondary | ICD-10-CM | POA: Diagnosis not present

## 2019-11-25 DIAGNOSIS — Z1159 Encounter for screening for other viral diseases: Secondary | ICD-10-CM

## 2019-11-25 DIAGNOSIS — E782 Mixed hyperlipidemia: Secondary | ICD-10-CM

## 2019-11-25 DIAGNOSIS — R739 Hyperglycemia, unspecified: Secondary | ICD-10-CM

## 2019-11-25 DIAGNOSIS — E7800 Pure hypercholesterolemia, unspecified: Secondary | ICD-10-CM

## 2019-11-25 LAB — POCT GLYCOSYLATED HEMOGLOBIN (HGB A1C): Hemoglobin A1C: 5.5 % (ref 4.0–5.6)

## 2019-11-25 MED ORDER — EZETIMIBE 10 MG PO TABS: 10.0000 mg | ORAL_TABLET | Freq: Every day | ORAL | 3 refills | Status: DC

## 2019-11-25 MED ORDER — ROSUVASTATIN CALCIUM 20 MG PO TABS: 20.0000 mg | ORAL_TABLET | Freq: Every day | ORAL | 3 refills | Status: DC

## 2019-11-25 NOTE — Progress Notes (Signed)
    SUBJECTIVE:   CHIEF COMPLAINT / HPI: check up  Patient states that he is feeling better than usual. He would like a check up and blood work.  Fatigue- constant and worse by the end of the day. Does not wake up tired. Denies snoring. Getting 5-6 hours of sleep per night. Having fatigue and SOB with walking increasingly shorter distances. Denies chest pain. Can walk up flight of stairs without stopping.   Positive depression screening on PHQ-9- patient is negative for SI, self harm. Endorses that his positive answers are related to fatigue.  OBJECTIVE:   BP 123/80   Pulse 82   Ht 6' (1.829 m)   Wt 232 lb 3.2 oz (105.3 kg)   SpO2 99%   BMI 31.49 kg/m   General: NAD, pleasant, able to participate in exam Cardiac: RRR, normal heart sounds, no murmurs. 2+ radial and PT pulses bilaterally. Negative LE edema Respiratory: CTAB, normal effort, No wheezes, rales or rhonchi Abdomen: soft, nontender, nondistended, no hepatic or splenomegaly, +BS Extremities: no edema or cyanosis. WWP. Skin: warm and dry, no rashes noted Neuro: alert and oriented x4, no focal deficits Psych: Normal affect and mood  ASSESSMENT/PLAN:   Vitamin D deficiency Potentially contributing to patient's fatigue. Will attempt to replace. high dose supplement once per week for 5 weeks. After those 5 weeks, normal dose over the counter vitamin D supplement to maintain your stores.  Hyperlipemia Lower dose tolerated- Increased rosuvastatin 20mg  daily Adding on zetia 10mg  daily Will recheck lipid panel in about 4 months  Fatigue Generalized fatigue that worsens throughout the day. Patient is improved from previous in some ways but has worsening DOE without chest pain. Low suspicion for sleep apnea contribution Recent CBC r/o anemia, and TSH wnl Unsure if this could be chronic sequelae from prior Lyme's infection Weight stable (Mother had unknown cancer and father had heart disease requiring pacemaker), patient is  a current and long-term smoker. He is below the age for recommended low-dose CT screening. Normal chest xray last month. Screen for vitamin deficiencies today: vit D and B12 which was positive for vitamin D deficiency and patient notified. Cardiac exam normal today. Currently asymptomatic No history or heart disease. No cardiac imaging on file.  I think patient would be a good candidate for coronary calcium scoring to risk stratify Referral to cardiology sent for request of stress testing, and possible echo.  Plan: rule out cardiac cause and replace vitamin D to rule out contribution. If not improved with these measures, can pursue other avenues.   Follow up closely  Hyperglycemia Consistently very mildly elevated glucose on labwork in the recent past. Hgb A1c screen today to r/o DM  Need for hepatitis C screening test negative     , DO [facility]

## 2019-11-25 NOTE — Progress Notes (Signed)
Patient given PHQ2 and 9.   Provider aware.  .Ediel Unangst R Vanden Fawaz, CMA  

## 2019-11-25 NOTE — Patient Instructions (Signed)
It was a pleasure to see you today!  To summarize our discussion for this visit:  We will check some basic labs today to screen for causes of fatigue, and diabetes.  I'm sending in a referral to a cardiologist for a possible stress test.  We are increasing your statin medication today and adding on a second medication. Lets recheck your cholesterol in about 3-4 months.   Some additional health maintenance measures we should update are: Health Maintenance Due  Topic Date Due  . Hepatitis C Screening  Never done  . COVID-19 Vaccine (1) Never done  . TETANUS/TDAP  Never done  .    Please return to our clinic to see me in 3-4 months.  Call the clinic at [phone] if your symptoms worsen or you have any concerns.   Thank you for allowing me to take part in your care,  Dr. Doristine Mango  10 LITTLE Things To Do When You're Feeling Too Down To Do Anything  Take a shower. Even if you plan to stay in all day long and not see a soul, take a shower. It takes the most effort to hop in to the shower but once you do, you'll feel immediate results. It will wake you up and you'll be feeling much fresher (and cleaner too).  Brush and floss your teeth. Give your teeth a good brushing with a floss finish. It's a small task but it feels so good and you can check 'taking care of your health' off the list of things to do.  Do something small on your list. Most of [place] have some small thing we would like to get done (load of laundry, sew a button, email a friend). Doing one of these things will make you feel like you've accomplished something.  Drink water. Drinking water is easy right? It's also really beneficial for your health so keep a glass beside you all day and take sips often. It gives you energy and prevents you from boredom eating.  Do some floor exercises. The last thing you want to do is exercise but it might be just the thing you need the most. Keep it simple and do exercises that  involve sitting or laying on the floor. Even the smallest of exercises release chemicals in the brain that make you feel good. Yoga stretches or core exercises are going to make you feel good with minimal effort.  Make your bed. Making your bed takes a few minutes but it's productive and you'll feel relieved when it's done. An unmade bed is a huge visual reminder that you're having an unproductive day. Do it and consider it your housework for the day.  Put on some nice clothes. Take the sweatpants off even if you don't plan to go anywhere. Put on clothes that make you feel good. Take a look in the mirror so your brain recognizes the sweatpants have been replaced with clothes that make you look great. It's an instant confidence booster.  Wash the dishes. A pile of dirty dishes in the sink is a reflection of your mood. It's possible that if you wash up the dishes, your mood will follow suit. It's worth a try.  Cook a real meal. If you have the luxury to have a "do nothing" day, you have time to make a real meal for yourself. Make a meal that you love to eat. The process is good to get you out of the funk and the food will ensure you have  more energy for tomorrow.  Write out your thoughts by hand. When you hand write, you stimulate your brain to focus on the moment that you're in so make yourself comfortable and write whatever comes into your mind. Put those thoughts out on paper so they stop spinning around in your head. Those thoughts might be the very thing holding you down.

## 2019-11-26 DIAGNOSIS — Z1159 Encounter for screening for other viral diseases: Secondary | ICD-10-CM | POA: Insufficient documentation

## 2019-11-26 DIAGNOSIS — E559 Vitamin D deficiency, unspecified: Secondary | ICD-10-CM | POA: Insufficient documentation

## 2019-11-26 DIAGNOSIS — R739 Hyperglycemia, unspecified: Secondary | ICD-10-CM | POA: Insufficient documentation

## 2019-11-26 DIAGNOSIS — R5383 Other fatigue: Secondary | ICD-10-CM | POA: Insufficient documentation

## 2019-11-26 LAB — VITAMIN B12: Vitamin B-12: 451 pg/mL (ref 232–1245)

## 2019-11-26 LAB — VITAMIN D 25 HYDROXY (VIT D DEFICIENCY, FRACTURES): Vit D, 25-Hydroxy: 21.3 ng/mL — ABNORMAL LOW (ref 30.0–100.0)

## 2019-11-26 LAB — HEPATITIS C ANTIBODY: Hep C Virus Ab: <0.1 s/co ratio (ref 0.0–0.9)

## 2019-11-26 MED ORDER — VITAMIN D (ERGOCALCIFEROL) 1.25 MG (50000 UNIT) PO CAPS
50000.0000 [IU] | ORAL_CAPSULE | ORAL | 0 refills | Status: DC
Start: 2019-11-26 — End: 2020-01-09

## 2019-11-26 NOTE — Assessment & Plan Note (Signed)
negative

## 2019-11-26 NOTE — Assessment & Plan Note (Signed)
Lower dose tolerated- Increased rosuvastatin 20mg  daily Adding on zetia 10mg  daily Will recheck lipid panel in about 4 months

## 2019-11-26 NOTE — Assessment & Plan Note (Signed)
Consistently very mildly elevated glucose on labwork in the recent past. Hgb A1c screen today to r/o DM

## 2019-11-26 NOTE — Assessment & Plan Note (Addendum)
Potentially contributing to patient's fatigue. Will attempt to replace. high dose supplement once per week for 5 weeks. After those 5 weeks, normal dose over the counter vitamin D supplement to maintain your stores.

## 2019-11-26 NOTE — Assessment & Plan Note (Addendum)
Generalized fatigue that worsens throughout the day. Patient is improved from previous in some ways but has worsening DOE without chest pain. Low suspicion for sleep apnea contribution Recent CBC r/o anemia, and TSH wnl Unsure if this could be chronic sequelae from prior Lyme's infection Weight stable (Mother had unknown cancer and father had heart disease requiring pacemaker), patient is a current and long-term smoker. He is below the age for recommended low-dose CT screening. Normal chest xray last month. Screen for vitamin deficiencies today: vit D and B12 which was positive for vitamin D deficiency and patient notified. Cardiac exam normal today. Currently asymptomatic No history or heart disease. No cardiac imaging on file.  I think patient would be a good candidate for coronary calcium scoring to risk stratify Referral to cardiology sent for request of stress testing, and possible echo.  Plan: rule out cardiac cause and replace vitamin D to rule out contribution. If not improved with these measures, can pursue other avenues.   Follow up closely

## 2019-12-01 ENCOUNTER — Telehealth: Payer: Self-pay | Admitting: Family Medicine

## 2019-12-01 NOTE — Telephone Encounter (Signed)
After Hours/ Emergency Line Call  Received a call to report that [name] started taking ezetimibe 2 days ago and believes this is making him sick.  Endorsing upper stomach pain and having nausea. Yellow bile color x5 last night and this morning x2 yesterday. Has taken zofran and continues to be nauseous. Afraid to eat. Took meds like normal. Got sick an hour later. Also headache when sleeping. He feels weak and has had chills. Not sure if it is related to the cooler weather this morning or not. Has cough and congestion. Denying loss of taste or smell. He his not vaccinated against covid. Covid test about 2-3 weeks ago that was negative.  Recommended that he be seen today at urgent care or ED. He has a history of medication induced pancreatitis.  Red flags discussed.  Will forward to PCP.  Lavonda Jumbo, DO PGY-2, [facility] 12/01/2019 10:48 AM

## 2019-12-09 ENCOUNTER — Encounter: Payer: Self-pay | Admitting: General Practice

## 2019-12-25 ENCOUNTER — Encounter: Payer: Self-pay | Admitting: Student in an Organized Health Care Education/Training Program

## 2019-12-29 ENCOUNTER — Other Ambulatory Visit: Payer: Self-pay | Admitting: Student in an Organized Health Care Education/Training Program

## 2020-01-01 ENCOUNTER — Ambulatory Visit (INDEPENDENT_AMBULATORY_CARE_PROVIDER_SITE_OTHER): Payer: 59 | Admitting: Student in an Organized Health Care Education/Training Program

## 2020-01-01 ENCOUNTER — Other Ambulatory Visit: Payer: Self-pay

## 2020-01-01 ENCOUNTER — Encounter: Payer: Self-pay | Admitting: Student in an Organized Health Care Education/Training Program

## 2020-01-01 DIAGNOSIS — R21 Rash and other nonspecific skin eruption: Secondary | ICD-10-CM

## 2020-01-01 MED ORDER — METHYLPREDNISOLONE 16 MG PO TABS: 16.0000 mg | ORAL_TABLET | Freq: Two times a day (BID) | ORAL | 0 refills | Status: AC

## 2020-01-01 NOTE — Patient Instructions (Signed)
It was a pleasure to see you today!  To summarize our discussion for this visit:  We are going to start a 3-day pack of steroids to try to cut down on the immune reaction flare.  I would encourage you to keep a diary over the next few days to see what things you might be doing, touching, eating that could be contributing to your symptoms  You can continue to take your antihistamines for symptomatic relief if still having symptoms after the steroid pack is complete  If you are still experiencing symptoms in a couple of weeks, please let me know and I can refer you to the allergy specialist  Some additional health maintenance measures we should update are: Health Maintenance Due  Topic Date Due  . COVID-19 Vaccine (1) Never done  . TETANUS/TDAP  Never done  . INFLUENZA VACCINE  12/29/2019  .   Call the clinic at [phone] if your symptoms worsen or you have any concerns.   Thank you for allowing me to take part in your care,  Dr. Jamelle Rushing

## 2020-01-01 NOTE — Progress Notes (Signed)
   SUBJECTIVE:   CHIEF COMPLAINT / HPI: rash-  Rash- about a week. Increased stress levels. Taking tylenol, ceterizine, benadryl, nasacort. Worse in the mornings and then takes the medicines and gets better.  Has no rash by the time he goes to bed at night.  And then in the morning, and is back again.  Burning pain in hot shower. Not pruritic. Stomach cramping. Eating makes worse. Followed by diarrhea. No fevers. Live with wife and 2 dogs and cat. Nobody else has symptoms. No change in laundry detergent. No new sheets or clothing or soaps/lotions.  Denies fevers.  Vit D deficiency- tolerating medication well  Cholesterol- stopped zetia. Still taking 20mg  rosuvastatin.  OBJECTIVE:   BP 132/84   Pulse 87   Wt 234 lb 6.4 oz (106.3 kg)   SpO2 97%   BMI 31.79 kg/m   General: NAD, pleasant, able to participate in exam Abdomen: soft, nontender, nondistended, no hepatic or splenomegaly, +BS Extremities: no edema or cyanosis. WWP. Skin: warm and dry, no rashes noted Neuro: alert and oriented x4, no focal deficits Psych: Normal affect and mood  ASSESSMENT/PLAN:   Rash of body Rash is resolved at time of appointment. -Prescribed steroid pack to help resolve reoccurrence of rash that sounds urticarial in nature. - Continue over-the-counter antihistamines as needed -Recommended patient keep journal of what he eats and does prior to onset of rashes to help identify trigger -If outbreak is not resolved with these therapies, discussed referral to allergy specialist with patient     , DO [facility]

## 2020-01-03 ENCOUNTER — Other Ambulatory Visit: Payer: Self-pay

## 2020-01-03 ENCOUNTER — Encounter: Payer: Self-pay | Admitting: Cardiovascular Disease

## 2020-01-03 ENCOUNTER — Ambulatory Visit (INDEPENDENT_AMBULATORY_CARE_PROVIDER_SITE_OTHER): Payer: 59 | Admitting: Cardiovascular Disease

## 2020-01-03 VITALS — BP 134/94 | HR 80 | Ht 72.0 in | Wt 234.8 lb

## 2020-01-03 DIAGNOSIS — Z8249 Family history of ischemic heart disease and other diseases of the circulatory system: Secondary | ICD-10-CM

## 2020-01-03 DIAGNOSIS — I10 Essential (primary) hypertension: Secondary | ICD-10-CM | POA: Diagnosis not present

## 2020-01-03 DIAGNOSIS — R0602 Shortness of breath: Secondary | ICD-10-CM

## 2020-01-03 DIAGNOSIS — R0789 Other chest pain: Secondary | ICD-10-CM | POA: Insufficient documentation

## 2020-01-03 DIAGNOSIS — R5383 Other fatigue: Secondary | ICD-10-CM | POA: Diagnosis not present

## 2020-01-03 DIAGNOSIS — Z720 Tobacco use: Secondary | ICD-10-CM

## 2020-01-03 DIAGNOSIS — R072 Precordial pain: Secondary | ICD-10-CM

## 2020-01-03 DIAGNOSIS — E782 Mixed hyperlipidemia: Secondary | ICD-10-CM

## 2020-01-03 MED ORDER — METOPROLOL TARTRATE 100 MG PO TABS
ORAL_TABLET | ORAL | 0 refills | Status: DC
Start: 2020-01-03 — End: 2020-01-16

## 2020-01-03 NOTE — Progress Notes (Signed)
01/03/2020 [name]   1971-03-03  [id number]  Primary Physician Leeroy Bock, DO Primary Cardiologist: Runell Gess MD Nicholes Calamity, [place]  HPI:  [name] is a 49 y.o. moderately overweight married Caucasian male father of 4 children, grandfather 1 grandchild who has not worked since 2018 after he contracted [employer] spotted fever and Lyme disease with multiple sequela after that.  He was referred by Karsten Fells, DO for cardiovascular valuation because of excessive fatigue and atypical chest pain.  His risk factors include 15-pack-year tobacco abuse currently smoking 1/2 pack/day.  He has family history of heart disease with both mother and father who have CAD.  He has treated hyperlipidemia and untreated hypertension.  He apparently was bitten by a tick back in 2018 and had [facility] spotted fever fever and Lyme disease with significant sequela.  He gets chest pain several times a day and some dyspnea as well.   Current Meds  Medication Sig  . acetaminophen (TYLENOL) 325 MG tablet Take 325 mg by mouth daily as needed for mild pain.  Marland Kitchen ascorbic acid (VITAMIN C) 500 MG tablet Take 1,000 mg by mouth daily.  . diphenhydrAMINE (BENADRYL) 25 MG tablet Take 25 mg by mouth 2 (two) times daily as needed for itching or allergies.  Marland Kitchen doxycycline (VIBRAMYCIN) 100 MG capsule Take 1 capsule (100 mg total) by mouth 2 (two) times daily.  Marland Kitchen ezetimibe (ZETIA) 10 MG tablet Take 1 tablet (10 mg total) by mouth daily.  . famotidine (PEPCID) 20 MG tablet Take 1 tablet (20 mg total) by mouth 2 (two) times daily.  . hydrOXYzine (ATARAX/VISTARIL) 50 MG tablet Take 1 tablet (50 mg total) by mouth 3 (three) times daily as needed. (Patient taking differently: Take 50 mg by mouth 3 (three) times daily as needed for anxiety or itching. )  . methocarbamol (ROBAXIN) 500 MG tablet Take 1 tablet (500 mg total) by mouth 2 (two) times daily.  . methylPREDNISolone (MEDROL) 16 MG  tablet Take 1 tablet (16 mg total) by mouth 2 (two) times daily for 3 days.  . rosuvastatin (CRESTOR) 20 MG tablet Take 1 tablet (20 mg total) by mouth daily.  Marland Kitchen triamcinolone (NASACORT ALLERGY 24HR) 55 MCG/ACT AERO nasal inhaler Place 1 spray into the nose daily.  . Vitamin D, Ergocalciferol, (DRISDOL) 1.25 MG (50000 UNIT) CAPS capsule Take 1 capsule (50,000 Units total) by mouth every 7 (seven) days.  Marland Kitchen VITAMIN E PO Take 2 tablets by mouth daily.     Allergies  Allergen Reactions  . Pregabalin Hives, Shortness Of Breath and Other (See Comments)    Insomnia Mood alter  . Sulfa Antibiotics Anaphylaxis  . Gabapentin Hives, Nausea And Vomiting and Nausea Only    Sweating, Fever, Hives Other reaction(s): Abdominal pain, Fever, Insomnia    Social History   Socioeconomic History  . Marital status: Married    Spouse name: Not on file  . Number of children: Not on file  . Years of education: Not on file  . Highest education level: Not on file  Occupational History  . Not on file  Tobacco Use  . Smoking status: Current Every Day Smoker    Packs/day: 0.50    Years: 30.00    Pack years: 15.00    Types: Cigarettes  . Smokeless tobacco: Never Used  Vaping Use  . Vaping Use: Never used  Substance and Sexual Activity  . Alcohol use: Yes  Comment: stopped 2018  . Drug use: No  . Sexual activity: Never  Other Topics Concern  . Not on file  Social History Narrative  . Not on file   Social Determinants of Health   Financial Resource Strain:   . Difficulty of Paying Living Expenses:   Food Insecurity:   . Worried About Programme researcher, broadcasting/film/video in the Last Year:   . Barista in the Last Year:   Transportation Needs:   . Freight forwarder (Medical):   Marland Kitchen Lack of Transportation (Non-Medical):   Physical Activity:   . Days of Exercise per Week:   . Minutes of Exercise per Session:   Stress:   . Feeling of Stress :   Social Connections:   . Frequency of Communication  with Friends and Family:   . Frequency of Social Gatherings with Friends and Family:   . Attends Religious Services:   . Active Member of Clubs or Organizations:   . Attends Banker Meetings:   Marland Kitchen Marital Status:   Intimate Partner Violence:   . Fear of Current or Ex-Partner:   . Emotionally Abused:   Marland Kitchen Physically Abused:   . Sexually Abused:      Review of Systems: General: negative for chills, fever, night sweats or weight changes.  Cardiovascular: negative for chest pain, dyspnea on exertion, edema, orthopnea, palpitations, paroxysmal nocturnal dyspnea or shortness of breath Dermatological: negative for rash Respiratory: negative for cough or wheezing Urologic: negative for hematuria Abdominal: negative for nausea, vomiting, diarrhea, bright red blood per rectum, melena, or hematemesis Neurologic: negative for visual changes, syncope, or dizziness All other systems reviewed and are otherwise negative except as noted above.    Blood pressure (!) 134/94, pulse 80, height 6' (1.829 m), weight 234 lb 12.8 oz (106.5 kg), SpO2 98 %.  General appearance: alert and no distress Neck: no adenopathy, no carotid bruit, no JVD, supple, symmetrical, trachea midline and thyroid not enlarged, symmetric, no tenderness/mass/nodules Lungs: clear to auscultation bilaterally Heart: regular rate and rhythm, S1, S2 normal, no murmur, click, rub or gallop Extremities: extremities normal, atraumatic, no cyanosis or edema Pulses: 2+ and symmetric Skin: Skin color, texture, turgor normal. No rashes or lesions Neurologic: Alert and oriented X 3, normal strength and tone. Normal symmetric reflexes. Normal coordination and gait  EKG sinus rhythm at 80 without ST or T wave changes.  I personally reviewed this EKG.  4-week blood pressure log in the next 2 pharmacist  ASSESSMENT AND PLAN:   Tobacco use Continue tobacco abuse of 1/2 pack/day having smoked for the last 30  years.  Hyperlipemia History of hyperlipidemia on rosuvastatin.  We will recheck a lipid liver profile today.  Essential hypertension History of essential potential blood pressure measured today 134/94.  He is not on antihypertensive medications.  I am going to have him calibrate his blood pressure cuff with our Pharm.D.'s and keep a 30-day blood pressure log and come back and review that with them prior to initiating pharmacologic therapy.  Atypical chest pain Patient with atypical chest pain which occurs several times a day, some somewhat activity related with risk factors include family history, tobacco abuse, hypertension and hyperlipidemia.  I am to get a coronary CTA to further evaluate      Runell Gess MD [facility], [facility] 01/03/2020 11:42 AM

## 2020-01-03 NOTE — Assessment & Plan Note (Signed)
History of hyperlipidemia on rosuvastatin.  We will recheck a lipid liver profile today.

## 2020-01-03 NOTE — Patient Instructions (Addendum)
Medication Instructions:  Your physician recommends that you continue on your current medications as directed. Please refer to the Current Medication list given to you today.  *If you need a refill on your cardiac medications before your next appointment, please call your pharmacy*   Lab Work: BMET to be completed one week prior to CT test  Lipid/Liver panels today If you have labs (blood work) drawn today and your tests are completely normal, you will receive your results only by: Marland Kitchen MyChart Message (if you have MyChart) OR . A paper copy in the mail If you have any lab test that is abnormal or we need to change your treatment, we will call you to review the results.   Testing/Procedures: Echocardiogram @ [address] - 3rd Floor  Coronary CTA @ [facility]   Follow-Up: At [facility], you and your health needs are our priority.  As part of our continuing mission to provide you with exceptional heart care, we have created designated Provider Care Teams.  These Care Teams include your primary Cardiologist (physician) and Advanced Practice Providers (APPs -  Physician Assistants and Nurse Practitioners) who all work together to provide you with the care you need, when you need it.  We recommend signing up for the patient portal called "MyChart".  Sign up information is provided on this After Visit Summary.  MyChart is used to connect with patients for Virtual Visits (Telemedicine).  Patients are able to view lab/test results, encounter notes, upcoming appointments, etc.  Non-urgent messages can be sent to your provider as well.   To learn more about what you can do with MyChart, go to [email or web address].    Your next appointment:   4 week(s)  The format for your next appointment:   In Person  Provider:   You may see Dr. Gwenlyn Found or one of the following Advanced Practice Providers on your designated Care Team:    Kerin Ransom, PA-C  [place], [place]  Coletta Memos, [place]   Schedule appointment for BP check and cuff check with clinical pharmacist (first available) and keep BP log  Follow up with clinical pharmacist for BP medication adjustment after 4 week BP log completed    Other Instructions  Your cardiac CT will be scheduled at one of the below locations:   [facility] [address] [place],  [address] (336) [place] [address] [place],  [address] [phone]  If scheduled at [facility], please arrive at the [facility] main entrance of [facility] 30 minutes prior to test start time. Proceed to the [facility] Radiology Department (first floor) to check-in and test prep.  If scheduled at [facility], please arrive 15 mins early for check-in and test prep.  Please follow these instructions carefully (unless otherwise directed):  Hold all erectile dysfunction medications at least 3 days (72 hrs) prior to test.  On the Night Before the Test: . Be sure to Drink plenty of water. . Do not consume any caffeinated/decaffeinated beverages or chocolate 12 hours prior to your test. . Do not take any antihistamines 12 hours prior to your test.  On the Day of the Test: . Drink plenty of water. Do not drink any water within one hour of the test. . Do not eat any food 4 hours prior to the test. . You may take your regular medications prior to the test.  .  Take metoprolol (Lopressor) two hours prior to test.  After the Test: . Drink plenty of water. . After receiving IV contrast, you may experience a mild flushed feeling. This is normal. . On occasion, you may experience a mild rash up to 24 hours after the test. This is not dangerous. If this occurs, you can take Benadryl 25 mg and increase your fluid intake. . If you experience trouble breathing, this can be serious. If it is severe call 911  IMMEDIATELY. If it is mild, please call our office. . If you take any of these medications: Glipizide/Metformin, Avandament, Glucavance, please do not take 48 hours after completing test unless otherwise instructed.   Once we have confirmed authorization from your insurance company, we will call you to set up a date and time for your test. Based on how quickly your insurance processes prior authorizations requests, please allow up to 4 weeks to be contacted for scheduling your Cardiac CT appointment. Be advised that routine Cardiac CT appointments could be scheduled as many as 8 weeks after your provider has ordered it.  For non-scheduling related questions, please contact the cardiac imaging nurse navigator should you have any questions/concerns: Marchia Bond, Cardiac Imaging Nurse Navigator Burley Saver, Interim Cardiac Imaging Nurse [facility] Direct Office Dial: [phone]   For scheduling needs, including cancellations and rescheduling, please call Vivien Rota at [phone], option 3.

## 2020-01-03 NOTE — Assessment & Plan Note (Signed)
Continue tobacco abuse of 1/2 pack/day having smoked for the last 30 years.

## 2020-01-03 NOTE — Assessment & Plan Note (Signed)
History of essential potential blood pressure measured today 134/94.  He is not on antihypertensive medications.  I am going to have him calibrate his blood pressure cuff with our Pharm.D.'s and keep a 30-day blood pressure log and come back and review that with them prior to initiating pharmacologic therapy.

## 2020-01-03 NOTE — Assessment & Plan Note (Signed)
Patient with atypical chest pain which occurs several times a day, some somewhat activity related with risk factors include family history, tobacco abuse, hypertension and hyperlipidemia.  I am to get a coronary CTA to further evaluate

## 2020-01-04 LAB — LIPID PANEL
Chol/HDL Ratio: 4.9 ratio (ref 0.0–5.0)
Cholesterol, Total: 132 mg/dL (ref 100–199)
HDL: 27 mg/dL — ABNORMAL LOW (ref >39)
LDL Chol Calc (NIH): 69 mg/dL (ref 0–99)
Triglycerides: 216 mg/dL — ABNORMAL HIGH (ref 0–149)
VLDL Cholesterol Cal: 36 mg/dL (ref 5–40)

## 2020-01-04 LAB — HEPATIC FUNCTION PANEL
ALT: 19 IU/L (ref 0–44)
AST: 28 IU/L (ref 0–40)
Albumin: 4.7 g/dL (ref 4.0–5.0)
Alkaline Phosphatase: 75 IU/L (ref 48–121)
Bilirubin Total: 0.4 mg/dL (ref 0.0–1.2)
Bilirubin, Direct: 0.12 mg/dL (ref 0.00–0.40)
Total Protein: 7 g/dL (ref 6.0–8.5)

## 2020-01-09 DIAGNOSIS — R21 Rash and other nonspecific skin eruption: Secondary | ICD-10-CM | POA: Insufficient documentation

## 2020-01-09 NOTE — Assessment & Plan Note (Addendum)
Rash is resolved at time of appointment. -Prescribed steroid pack to help resolve reoccurrence of rash that sounds urticarial in nature. - Continue over-the-counter antihistamines as needed -Recommended patient keep journal of what he eats and does prior to onset of rashes to help identify trigger -If outbreak is not resolved with these therapies, discussed referral to allergy specialist with patient

## 2020-01-10 ENCOUNTER — Telehealth: Payer: Self-pay | Admitting: Cardiovascular Disease

## 2020-01-10 NOTE — Telephone Encounter (Signed)
Left message for patient that it is OK to have all appointments scheduled on same day as long as he feels up to that, no contraindications from our standpoint

## 2020-01-10 NOTE — Telephone Encounter (Signed)
Patient is scheduled for an Echo and Pharm D visit on 01/16/20---he is also scheduled for an epidural for his neck pain  earlier in the morning.  Should he reschedule the Echo and Pharm D visit or can he do all of these in the same day?

## 2020-01-16 ENCOUNTER — Other Ambulatory Visit: Payer: Self-pay

## 2020-01-16 ENCOUNTER — Ambulatory Visit (HOSPITAL_COMMUNITY): Payer: 59 | Attending: Cardiovascular Disease

## 2020-01-16 ENCOUNTER — Ambulatory Visit (INDEPENDENT_AMBULATORY_CARE_PROVIDER_SITE_OTHER): Payer: 59 | Admitting: Pharmacist

## 2020-01-16 VITALS — BP 144/96 | HR 105

## 2020-01-16 DIAGNOSIS — R0602 Shortness of breath: Secondary | ICD-10-CM | POA: Insufficient documentation

## 2020-01-16 DIAGNOSIS — R0789 Other chest pain: Secondary | ICD-10-CM | POA: Insufficient documentation

## 2020-01-16 DIAGNOSIS — I10 Essential (primary) hypertension: Secondary | ICD-10-CM | POA: Insufficient documentation

## 2020-01-16 DIAGNOSIS — Z8249 Family history of ischemic heart disease and other diseases of the circulatory system: Secondary | ICD-10-CM | POA: Diagnosis present

## 2020-01-16 DIAGNOSIS — R5383 Other fatigue: Secondary | ICD-10-CM | POA: Diagnosis not present

## 2020-01-16 MED ORDER — VALSARTAN 40 MG PO TABS: 40.0000 mg | ORAL_TABLET | Freq: Every day | ORAL | 0 refills | Status: DC

## 2020-01-16 NOTE — Patient Instructions (Addendum)
It was nice meeting you today!  Your blood pressure goal is less than 130/80  We are going to start valsartan 40 mg once daily and check your lab work in 2 weeks  Please call with any questions or adverse effects  Laural Golden, PharmD, P[place]y [place], CDCES [facility] [address] [place], [place] [address] Phone: [phone]; Fax: [phone] 01/16/2020 3:45 PM

## 2020-01-16 NOTE — Progress Notes (Signed)
Patient ID: [name] Chen                 DOB: Feb 04, 1971                      MRN: [id number]     HPI: [name] is a 49 y.o. male referred by Dr. Allyson Sabal to HTN clinic. PMH is significant for HTN, chest pain, HLD, SOB, and tobacco use.  He has a history if [facility] spotted fever and Lyme Disease that has kept him out of work. Reports frequent chest pain, weight fluctuations, sweating, rashes, WBC elevations and pancreatic enzyme elevations.  Does not sleep well at night.  Seen by Dr Allyson Sabal on 8/6 and blood pressure was 134/94.  Was not currently on any antihypertensives.  Patient was instructed to keep a 30 day blood pressure log and bring to appointment today.   Patient presents today and reports he believes the majority of his health issues are due to hx of Lyme.  Current everyday smoker, does not drink alchohol.  Not currently on any blood pressure medications, reports adverse reactions to many meds he has tried in the past.  Had epidural this morning for neck/back pain.  BP log from past week 8/19: 126/90 8/18: 138/86 8/17: 134/90 8/16: 123/90 8/15: 134/85, 127/84 8/14: 135/86 8/13: 141/90, 136/89  Patient has appointment for echo after this appointment  Current HTN meds: n/a Previously tried: n/a BP goal: <130/80  Family History:   Social History: no EtOH, current every day smoker  Wt Readings from Last 3 Encounters:  01/03/20 234 lb 12.8 oz (106.5 kg)  01/01/20 234 lb 6.4 oz (106.3 kg)  11/25/19 232 lb 3.2 oz (105.3 kg)   BP Readings from Last 3 Encounters:  01/03/20 (!) 134/94  01/01/20 132/84  11/25/19 123/80   Pulse Readings from Last 3 Encounters:  01/03/20 80  01/01/20 87  11/25/19 82    Renal function: CrCl cannot be calculated (Patient's most recent lab result is older than the maximum 21 days allowed.).  Past Medical History:  Diagnosis Date  . Angio-edema   . Anxiety   . Headache   . Lyme disease   . [facility] spotted  fever   . Urticaria     Current Outpatient Medications on File Prior to Visit  Medication Sig Dispense Refill  . acetaminophen (TYLENOL) 325 MG tablet Take 325 mg by mouth daily as needed for mild pain.    Marland Kitchen ascorbic acid (VITAMIN C) 500 MG tablet Take 1,000 mg by mouth daily.    . diphenhydrAMINE (BENADRYL) 25 MG tablet Take 25 mg by mouth 2 (two) times daily as needed for itching or allergies.    . famotidine (PEPCID) 20 MG tablet Take 1 tablet (20 mg total) by mouth 2 (two) times daily. 10 tablet 0  . metoprolol tartrate (LOPRESSOR) 100 MG tablet Take ONE tablet TWO HOURS prior to CT test. 1 tablet 0  . rosuvastatin (CRESTOR) 20 MG tablet Take 1 tablet (20 mg total) by mouth daily. 90 tablet 3  . triamcinolone (NASACORT ALLERGY 24HR) 55 MCG/ACT AERO nasal inhaler Place 1 spray into the nose daily.    Marland Kitchen VITAMIN E PO Take 2 tablets by mouth daily.    . [DISCONTINUED] Cetirizine HCl 10 MG CAPS Take 10 mg by mouth 2 (two) times daily.     . [DISCONTINUED] mupirocin nasal ointment (BACTROBAN) 2 % Apply to rash (Patient not taking: Reported  on 10/05/2019) 100 g 0   No current facility-administered medications on file prior to visit.    Allergies  Allergen Reactions  . Pregabalin Hives, Shortness Of Breath and Other (See Comments)    Insomnia Mood alter  . Sulfa Antibiotics Anaphylaxis  . Gabapentin Hives, Nausea And Vomiting and Nausea Only    Sweating, Fever, Hives Other reaction(s): Abdominal pain, Fever, Insomnia     Assessment/Plan:  1. Hypertension - Patient BP in room today 144/86 which is above goal of <130/80.  Patient is willing to try an antihypertensive medication but start low and go slow due to patient's reported intolerances.  Renal function currently normal.  Due to sweating, will avoid thiazides first but patient is willing to try if he tolerates ACE/ARB. Will start ARB for BP control and check BMP in 2 weeks.  Due to Echo appointment, was not able to discuss lifestyle  changes at this visit.  Patient will follow up in 1 month to evaluate effectiveness, tolerability and discuss lifestyle changes.    Laural Golden, PharmD, BCACP, CDCES [facility] [address] [place], [place] [address] Phone: [phone]; Fax: [phone] 01/16/2020 4:08 PM

## 2020-01-17 LAB — ECHOCARDIOGRAM COMPLETE
Area-P 1/2: 3.42 cm2
S' Lateral: 3.1 cm

## 2020-01-19 ENCOUNTER — Encounter: Payer: Self-pay | Admitting: Student in an Organized Health Care Education/Training Program

## 2020-01-22 NOTE — Telephone Encounter (Signed)
Left message on machine for patient to schedule 1 month follow up

## 2020-01-23 ENCOUNTER — Other Ambulatory Visit: Payer: Self-pay | Admitting: Student in an Organized Health Care Education/Training Program

## 2020-01-23 DIAGNOSIS — R21 Rash and other nonspecific skin eruption: Secondary | ICD-10-CM

## 2020-01-23 NOTE — Progress Notes (Signed)
Patient had return of urticarial rash. Put in referral to allergy specialist as discussed with patient and preceptor at last visit.

## 2020-01-27 ENCOUNTER — Other Ambulatory Visit: Payer: Self-pay

## 2020-01-27 ENCOUNTER — Telehealth (HOSPITAL_COMMUNITY): Payer: Self-pay | Admitting: Emergency Medicine

## 2020-01-27 ENCOUNTER — Ambulatory Visit (INDEPENDENT_AMBULATORY_CARE_PROVIDER_SITE_OTHER): Payer: 59 | Admitting: Family Medicine

## 2020-01-27 VITALS — BP 118/82 | HR 93 | Wt 234.4 lb

## 2020-01-27 DIAGNOSIS — L509 Urticaria, unspecified: Secondary | ICD-10-CM | POA: Diagnosis not present

## 2020-01-27 MED ORDER — HYDROXYZINE HCL 25 MG PO TABS: 25.0000 mg | ORAL_TABLET | Freq: Three times a day (TID) | ORAL | 0 refills | Status: DC | PRN

## 2020-01-27 NOTE — Telephone Encounter (Signed)
Attempted to call patient regarding upcoming cardiac CT appointment. °Left message on voicemail with name and callback number °Bristyn Kulesza RN Navigator Cardiac Imaging °[facility] °[phone] Office °[phone] Cell ° °

## 2020-01-27 NOTE — Progress Notes (Signed)
    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 that appears mostly on his thighs, also worse on his right leg of which she wears a brace and stocking at all times Reports that it gets worse in the sun, burns, feels better with cold water and getting out of the sun He has been diagnosed with chronic Lyme and feels that this is something that flares up almost monthly Reports that he also has GI symptoms and headaches when this occurs Has been under a lot of stress recently and feels like stress also flares it up   PERTINENT  PMH / PSH: Hypertension, history of Lyme disease  OBJECTIVE:   BP 118/82   Pulse 93   Wt 234 lb 6.4 oz (106.3 kg)   SpO2 97%   BMI 31.79 kg/m    Physical Exam:  General: 49 y.o. 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  Also question if he urticaria has anything to do with this given that patient has worsening in sun and heat exposure, also appears to be in locations that are covered in hotter, including under his stocking on his right leg that he has a brace on.  Precepted with Dr. Leveda Anna.  Will discontinue Zyrtec, start hydroxyzine 25 mg 3 times daily as needed, might also help with some of patient's anxiety, continue famotidine.  Could consider doxepin use if no improvement as this is also been shown to be useful for chronic urticaria.  Allergy referral is already in place, patient advised to contact office in 2 days if he does not hear from anyone.  Also advised frequent follow-up with PCP, return to see Dr. Dareen Piano in 1 month.  Return to care if no improvement, advised to go to urgent care right away if he starts having  difficulty breathing.     Unknown Jim, DO [facility]

## 2020-01-27 NOTE — Patient Instructions (Signed)
Thank you for coming to see me today. It was a pleasure. Today we talked about:   Avoid heat and sun.  Stop using zyrtec.  Continue famotidine.  Start using hydroxyzine which you can use 3 times daily.  Avoid heat and sun exposure.  Your referral for Allergy is still pending.  If you don't hear in 2 days, call us.  Please follow-up with PCP in 1 month.  If you have any questions or concerns, please do not hesitate to call the office at [phone].  Best,   Luis Abed, DO

## 2020-01-27 NOTE — Assessment & Plan Note (Signed)
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  Also question if he urticaria has anything to do with this given that patient has worsening in sun and heat exposure, also appears to be in locations that are covered in hotter, including under his stocking on his right leg that he has a brace on.  Precepted with Dr. Leveda Anna.  Will discontinue Zyrtec, start hydroxyzine 25 mg 3 times daily as needed, might also help with some of patient's anxiety, continue famotidine.  Could consider doxepin use if no improvement as this is also been shown to be useful for chronic urticaria.  Allergy referral is already in place, patient advised to contact office in 2 days if he does not hear from anyone.  Also advised frequent follow-up with PCP, return to see Dr. Dareen Piano in 1 month.  Return to care if no improvement, advised to go to urgent care right away if he starts having difficulty breathing.

## 2020-01-28 ENCOUNTER — Telehealth (HOSPITAL_COMMUNITY): Payer: Self-pay | Admitting: Emergency Medicine

## 2020-01-28 ENCOUNTER — Ambulatory Visit (HOSPITAL_COMMUNITY)
Admission: RE | Admit: 2020-01-28 | Discharge: 2020-01-28 | Disposition: A | Discharge status: Home / Self Care | Payer: 59 | Source: Ambulatory Visit | Attending: Cardiovascular Disease | Admitting: Cardiovascular Disease

## 2020-01-28 DIAGNOSIS — Z720 Tobacco use: Secondary | ICD-10-CM | POA: Insufficient documentation

## 2020-01-28 DIAGNOSIS — Z8249 Family history of ischemic heart disease and other diseases of the circulatory system: Secondary | ICD-10-CM | POA: Insufficient documentation

## 2020-01-28 DIAGNOSIS — I10 Essential (primary) hypertension: Secondary | ICD-10-CM | POA: Diagnosis present

## 2020-01-28 DIAGNOSIS — E782 Mixed hyperlipidemia: Secondary | ICD-10-CM | POA: Insufficient documentation

## 2020-01-28 DIAGNOSIS — R5383 Other fatigue: Secondary | ICD-10-CM | POA: Insufficient documentation

## 2020-01-28 DIAGNOSIS — R0789 Other chest pain: Secondary | ICD-10-CM | POA: Insufficient documentation

## 2020-01-28 DIAGNOSIS — R072 Precordial pain: Secondary | ICD-10-CM | POA: Diagnosis not present

## 2020-01-28 DIAGNOSIS — R0602 Shortness of breath: Secondary | ICD-10-CM | POA: Diagnosis present

## 2020-01-28 MED ORDER — IOHEXOL 350 MG/ML SOLN
80.0000 mL | Freq: Once | INTRAVENOUS | Status: AC | PRN
  Administered 2020-01-28: 80 mL via INTRAVENOUS

## 2020-01-28 MED ORDER — NITROGLYCERIN 0.4 MG SL SUBL
SUBLINGUAL_TABLET | SUBLINGUAL | Status: AC
  Filled 2020-01-28: qty 2

## 2020-01-28 MED ORDER — NITROGLYCERIN 0.4 MG SL SUBL
SUBLINGUAL_TABLET | SUBLINGUAL | Status: AC
  Filled 2020-01-28: qty 1

## 2020-01-28 MED ORDER — NITROGLYCERIN 0.4 MG SL SUBL
0.8000 mg | SUBLINGUAL_TABLET | Freq: Once | SUBLINGUAL | Status: AC
  Administered 2020-01-28: 0.8 mg via SUBLINGUAL

## 2020-01-28 NOTE — Telephone Encounter (Signed)
Pt returning phone call regarding upcoming cardiac imaging study; pt verbalizes understanding of appt date/time, parking situation and where to check in, pre-test NPO status and medications ordered, and verified current allergies; name and call back number provided for further questions should they arise Garrett Alexandria RN Navigator Cardiac Imaging [facility] [phone] office [phone] cell  Pt states his resting HR is 90-110 bpm and I told him that the metoprolol was to help control the HR and if that doesn't work we will attempt with IV doses of medication for HR control however if that doesn't work he might be turned away and we will refer him back to Dr Allyson Sabal for further guidance.  Pt verbalized understanding

## 2020-01-29 ENCOUNTER — Telehealth: Payer: Self-pay

## 2020-01-29 ENCOUNTER — Telehealth: Payer: Self-pay | Admitting: Cardiovascular Disease

## 2020-01-29 NOTE — Telephone Encounter (Signed)
Operator called and tried  This started last night states that he does not feel well with stomach ache he was able to go to cleep but was awakened at 5am with the same feeling. Also, stomach feels upset but "went to the bathroom and it is not". he states that he "feels sweaty". His BP this morning was 141/96 but HR was 31. He has taken his valsartan. He states that his PCP will be fitting him in today. He will have them fax over his EKG when he is there. He will take it easy until PCP appt today. He states that if it gets worse before he goes to PCP he will go to the ER.  Any suggestions?

## 2020-01-29 NOTE — Telephone Encounter (Signed)
Cor CA Score was 0 without any ebvid of CAD. I suspect his symptoms are non cardiac.

## 2020-01-29 NOTE — Telephone Encounter (Signed)
STAT if HR is under 50 or over 120 (normal HR is 60-100 beats per minute)  1) What is your heart rate? 31  2) Do you have a log of your heart rate readings (document readings)? No   3) Do you have any other symptoms? Upper stomach is hurting right around rib cage, states he feels as if he is drunk. The stomach pain woke him up out of his sleep  BP 136/96

## 2020-01-29 NOTE — Telephone Encounter (Signed)
Appointment next week with Dr.Berry- PCP advised of the same.  To monitor and ED if worsening symptoms.

## 2020-01-29 NOTE — Telephone Encounter (Signed)
Patient calls nurse line reporting a heart rate of 32 first thing this morning and BP of 131/93. Patient reports he had a CT yesterday ordered by his cardiologist, and was given 1 Metoprolol and 2 nitroglycerins ~2pm. Patient reports he started feeling bad last night and went to bed. Patient reports he woke this morning feeling "faint" with abdominal pain and nausea which promoted him to check his vitals. Patient reports now his HR has increased to 58 and BP 140/91. Patient has spoken with his cardiologist and is being seen next week advised to rest and monitor. I precepted with Manson Passey who advised to see them and to be seen here if able. Earliest apt 9/3 on ATC, apt made. Patient called back to notify and strict ED precautions given.

## 2020-01-30 ENCOUNTER — Other Ambulatory Visit: Payer: 59

## 2020-01-31 ENCOUNTER — Other Ambulatory Visit: Payer: Self-pay

## 2020-01-31 ENCOUNTER — Ambulatory Visit (INDEPENDENT_AMBULATORY_CARE_PROVIDER_SITE_OTHER): Payer: 59 | Admitting: Family Medicine

## 2020-01-31 VITALS — BP 120/82 | HR 91 | Ht 72.0 in | Wt 232.0 lb

## 2020-01-31 DIAGNOSIS — R1013 Epigastric pain: Secondary | ICD-10-CM | POA: Diagnosis not present

## 2020-01-31 MED ORDER — PROMETHAZINE HCL 12.5 MG PO TABS: 12.5000 mg | ORAL_TABLET | Freq: Three times a day (TID) | ORAL | 0 refills | Status: DC | PRN

## 2020-01-31 MED ORDER — OXYCODONE HCL 5 MG PO TABS
5.0000 mg | ORAL_TABLET | Freq: Four times a day (QID) | ORAL | 0 refills | Status: AC | PRN
Start: 2020-01-31 — End: 2020-02-05

## 2020-01-31 NOTE — Progress Notes (Signed)
    SUBJECTIVE:   CHIEF COMPLAINT / HPI: epigastric pain and emesis   Patient reports waking up on Wednesday morning with severe epigastric pain.  He states that since then he has had several episodes of yellow-colored emesis and has not been able to tolerate much orally.  He reports that his epigastric pain feels like constant cramping and bloating.  He reports that he has had pancreatitis in 2020 after discovering that he was allergic to gabapentin.  He denies fevers but does sometimes feeling warm.  He states that he has not been able to eat since the pain started.  Patient also reports about 4 episodes of diarrhea per day.  Diarrhea does not appear to have bloody or abnormal color to it.  He states that he was able to tolerate a piece of toast.  Patient also states that he has pain between his shoulder blades.  Patient states that his triglycerides have been known to be elevated and is concerned that this could contribute to his pancreatitis.   PERTINENT  PMH / PSH:  Pancreatitis  OBJECTIVE:   BP 120/82   Pulse 91   Ht 6' (1.829 m)   Wt 232 lb (105.2 kg)   SpO2 97%   BMI 31.46 kg/m    General: male appearing stated age, holding abdomen, uncomfortable appearing unable to sit upright  Mouth: MMM  Cardio: Normal S1 and S2, no S3 or S4. Rhythm is regular. No murmurs or rubs.  Bilateral radial pulses palpable Pulm: Clear to auscultation bilaterally, no crackles, wheezing, or diminished breath sounds. Normal respiratory effort Abdomen: hyperactive bowel sounds, tenderness diffusely but more concentrated in epigastric region and in RUQ, bloating visualized    ASSESSMENT/PLAN:   Acute epigastric pain Epigastric pain radiating to the back between shoulder blades most consistent with acute pancreatitis that is likely due to elevated triglycerides.  Patient with history of pancreatitis also makes this more concerning on differential.  Could also consider duodenal ulcer.  -Encourage patient  to hydrate with plenty of oral fluids -Prescribed oxycodone 5 mg -Return precautions given, patient to go to the ED if pain worsens or if he is unable to hydrate orally, or worsening emesis.  Patient verbalized understanding -CBC, CMP and lipase collected today     Ronnald Ramp, MD [facility]

## 2020-01-31 NOTE — Patient Instructions (Signed)
Thank you for choosing [facility] for your care today!  I recommend that she continue to drink fluids to try to remain hydrated so that she will not need to go to the hospital. I have prescribed Phenergan for you to take to help with your nausea. I have also prescribed some pain medication to try to help your epigastric pain to decrease as you attempt to keep yourself hydrated.  If you continue to have severe or worsening pain on Sunday 9/5, I recommend that you go to the ED for further evaluation and imaging.  I will collect a metabolic panel, CBC and lipase level to see if you do indeed have pancreatitis or some other inflammation.  I will call you with any abnormal results.

## 2020-02-01 LAB — CBC WITH DIFFERENTIAL/PLATELET
Basophils Absolute: 0.1 10*3/uL (ref 0.0–0.2)
Basos: 0 %
EOS (ABSOLUTE): 0.2 10*3/uL (ref 0.0–0.4)
Eos: 1 %
Hematocrit: 45.4 % (ref 37.5–51.0)
Hemoglobin: 15 g/dL (ref 13.0–17.7)
Immature Grans (Abs): 0 10*3/uL (ref 0.0–0.1)
Immature Granulocytes: 0 %
Lymphocytes Absolute: 2.6 10*3/uL (ref 0.7–3.1)
Lymphs: 20 %
MCH: 30.2 pg (ref 26.6–33.0)
MCHC: 33 g/dL (ref 31.5–35.7)
MCV: 92 fL (ref 79–97)
Monocytes Absolute: 0.8 10*3/uL (ref 0.1–0.9)
Monocytes: 6 %
Neutrophils Absolute: 9.5 10*3/uL — ABNORMAL HIGH (ref 1.4–7.0)
Neutrophils: 73 %
Platelets: 226 10*3/uL (ref 150–450)
RBC: 4.96 x10E6/uL (ref 4.14–5.80)
RDW: 12.3 % (ref 11.6–15.4)
WBC: 13.1 10*3/uL — ABNORMAL HIGH (ref 3.4–10.8)

## 2020-02-01 LAB — COMPREHENSIVE METABOLIC PANEL
ALT: 20 IU/L (ref 0–44)
AST: 20 IU/L (ref 0–40)
Albumin/Globulin Ratio: 2 (ref 1.2–2.2)
Albumin: 4.6 g/dL (ref 4.0–5.0)
Alkaline Phosphatase: 74 IU/L (ref 48–121)
BUN/Creatinine Ratio: 7 — ABNORMAL LOW (ref 9–20)
BUN: 7 mg/dL (ref 6–24)
Bilirubin Total: 0.3 mg/dL (ref 0.0–1.2)
CO2: 21 mmol/L (ref 20–29)
Calcium: 9.7 mg/dL (ref 8.7–10.2)
Chloride: 102 mmol/L (ref 96–106)
Creatinine, Ser: 1.06 mg/dL (ref 0.76–1.27)
GFR calc Af Amer: 96 mL/min/{1.73_m2} (ref >59)
GFR calc non Af Amer: 83 mL/min/{1.73_m2} (ref >59)
Globulin, Total: 2.3 g/dL (ref 1.5–4.5)
Glucose: 117 mg/dL — ABNORMAL HIGH (ref 65–99)
Potassium: 3.8 mmol/L (ref 3.5–5.2)
Sodium: 144 mmol/L (ref 134–144)
Total Protein: 6.9 g/dL (ref 6.0–8.5)

## 2020-02-01 LAB — LIPASE: Lipase: 57 U/L (ref 13–78)

## 2020-02-02 DIAGNOSIS — R1013 Epigastric pain: Secondary | ICD-10-CM | POA: Insufficient documentation

## 2020-02-02 NOTE — Assessment & Plan Note (Addendum)
Epigastric pain radiating to the back between shoulder blades most consistent with acute pancreatitis that is likely due to elevated triglycerides.  Patient with history of pancreatitis also makes this more concerning on differential.  Could also consider duodenal ulcer.  -Encourage patient to hydrate with plenty of oral fluids -Prescribed oxycodone 5 mg -Return precautions given, patient to go to the ED if pain worsens or if he is unable to hydrate orally, or worsening emesis.  Patient verbalized understanding -CBC, CMP and lipase collected today

## 2020-02-06 ENCOUNTER — Other Ambulatory Visit: Payer: Self-pay

## 2020-02-06 ENCOUNTER — Ambulatory Visit (INDEPENDENT_AMBULATORY_CARE_PROVIDER_SITE_OTHER): Payer: 59 | Admitting: Allergy & Immunology

## 2020-02-06 ENCOUNTER — Encounter: Payer: Self-pay | Admitting: Allergy & Immunology

## 2020-02-06 VITALS — BP 142/88 | HR 91 | Temp 98.8°F | Resp 18 | Ht 72.0 in | Wt 230.8 lb

## 2020-02-06 DIAGNOSIS — J310 Chronic rhinitis: Secondary | ICD-10-CM

## 2020-02-06 DIAGNOSIS — L501 Idiopathic urticaria: Secondary | ICD-10-CM | POA: Diagnosis not present

## 2020-02-06 MED ORDER — FAMOTIDINE 20 MG PO TABS: ORAL_TABLET | ORAL | 5 refills | Status: DC

## 2020-02-06 MED ORDER — MONTELUKAST SODIUM 10 MG PO TABS: 10.0000 mg | ORAL_TABLET | Freq: Every day | ORAL | 5 refills | Status: DC

## 2020-02-06 MED ORDER — FEXOFENADINE HCL 180 MG PO TABS: ORAL_TABLET | ORAL | 5 refills | Status: DC

## 2020-02-06 NOTE — Progress Notes (Signed)
FOLLOW UP  Date of Service/Encounter:  02/06/20   Assessment:   Chronic idiopathic urticaria - unclear etiology  Diagnosis of chronic Lyme disease   [name] presents for reevaluation of his chronic idiopathic urticaria.  We last saw him in 2019, but he never got his labs at that time and never followed up.  Evidently, the antihistamines he was on were not working.  He was changed to hydroxyzine within the last week but has developed sedation from this.  He is open to additional labs and different medication regimen to see if this would help.  I am still unclear whether these hives are related to his underlying diagnosis of chronic Lyme disease, although he seems to want to blame most of his symptoms on a chronic Lyme disease.  It should be noted that I am not an expert of his.   Plan/Recommendations:   1. Chronic idiopathic urticaria - Your history is confusing with the history of the Lyme disease. - Your hives do not look classic to me, but we will see how they respond to the antihistamines.  - We will get some labs to rule out serious causes of hives: complete blood count, tryptase level, alpha gal panel, chronic urticaria panel, CMP, ESR, and CRP. - Chronic hives are often times a self limited process and will "burn themselves out" over 6-12 months, although this is not always the case.  - In the meantime, start suppressive dosing of antihistamines:     - Morning: Allegra (fexofenadine) 139m (one tablet) + Pepcid (famotidine) 211m   - Noon: Allegra (fexofenadine) 18056mone tablet) + Pepcid (famotidine) 81m35m - Evening: Allegra (fexofenadine) 180mg6me tablet) + Pepcid (famotidine) 81mg 27mngulair (montelukast) 10mg -2m can change this dosing at home, decreasing the dose as needed or increasing the dosing as needed.  - If you are not tolerating the medications or are tired of taking them every day, we can start treatment with a monthly injectable medication called Xolair.    2. Return in about 3 months (around 05/07/2020).   Subjective:   [name] a 48 y.o.57ale presenting today for follow up of  Chief Complaint  Patient presents with  . Follow-up  . Urticaria    [name] has a history of the following: Patient Active Problem List   Diagnosis Date Noted  . Acute epigastric pain 02/02/2020  . Rash of body 01/09/2020  . Essential hypertension 01/03/2020  . Atypical chest pain 01/03/2020  . Vitamin D deficiency 11/26/2019  . Fatigue 11/26/2019  . Hyperglycemia 11/26/2019  . Need for hepatitis C screening test 11/26/2019  . Pain, dental 10/26/2019  . Tobacco use 10/11/2019  . Lyme disease   . Hyperlipemia   . Urticaria 04/30/2018  . S/P lumbar fusion 11/15/2017    History obtained from: chart review and patient.  Garrett i[age]ark8 y.o.46ale presenting for a follow up visit.  Dr. BobbittVerlin Festerd a lot of labs, but it does not seem that these were ever collected.  He was started on alternating 2nd generation antihistamines.   Currently he is on hydroxyzine TID and Pepcid. He was changed to hydroxyzine a couple of days ago. He had a CT with contrast and he developed vomiting and low grade fever. This was thought to be secondary to pancreatitis. He has a history of Lyme and RMSF, diagnosed in 2018. He is being treated for this, often with month long courses  of antibiotics. 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   He reports joint pain. He has a rash over his entire body. Prednisone helps it completely resolve. It seems that these "hives" occurred AFTER his chronic Lyme disease was diagnosed. He personally thinks that the hives are related to the Lyme disease. He is very focused on the Lyme diagnosis today.  Otherwise, there have been no changes to his past medical history, surgical history, family history, or social history.    Review of Systems  Constitutional: Negative.  Negative for chills,  fever, malaise/fatigue and weight loss.  HENT: Negative for congestion, ear discharge, ear pain and sinus pain.   Eyes: Negative for pain, discharge and redness.  Respiratory: Negative for cough, sputum production, shortness of breath and wheezing.   Cardiovascular: Negative.  Negative for chest pain and palpitations.  Gastrointestinal: Negative for abdominal pain, constipation, diarrhea, heartburn, nausea and vomiting.  Skin: Positive for itching and rash.  Neurological: Negative for dizziness and headaches.  Endo/Heme/Allergies: Positive for environmental allergies. Does not bruise/bleed easily.       Objective:   Blood pressure (!) 142/88, pulse 91, temperature 98.8 F (37.1 C), temperature source Temporal, resp. rate 18, height 6' (1.829 m), weight 230 lb 12.8 oz (104.7 kg), SpO2 97 %. Body mass index is 31.3 kg/m.   Physical Exam:  Physical Exam Constitutional:      Appearance: He is well-developed.  HENT:     Head: Normocephalic and atraumatic.     Right Ear: Tympanic membrane, ear canal and external ear normal. No drainage, swelling or tenderness. Tympanic membrane is not injected, scarred, erythematous, retracted or bulging.     Left Ear: Tympanic membrane, ear canal and external ear normal. No drainage, swelling or tenderness. Tympanic membrane is not injected, scarred, erythematous, retracted or bulging.     Nose: No nasal deformity, septal deviation, mucosal edema or rhinorrhea.     Right Sinus: No maxillary sinus tenderness or frontal sinus tenderness.     Left Sinus: No maxillary sinus tenderness or frontal sinus tenderness.     Mouth/Throat:     Mouth: Mucous membranes are not pale and not dry.     Pharynx: Uvula midline.  Eyes:     General:        Right eye: No discharge.        Left eye: No discharge.     Conjunctiva/sclera: Conjunctivae normal.     Right eye: Right conjunctiva is not injected. No chemosis.    Left eye: Left conjunctiva is not injected. No  chemosis.    Pupils: Pupils are equal, round, and reactive to light.  Cardiovascular:     Rate and Rhythm: Normal rate and regular rhythm.     Heart sounds: Normal heart sounds.  Pulmonary:     Effort: Pulmonary effort is normal. No tachypnea, accessory muscle usage or respiratory distress.     Breath sounds: Normal breath sounds. No wheezing, rhonchi or rales.  Chest:     Chest wall: No tenderness.  Abdominal:     Tenderness: There is no abdominal tenderness. There is no guarding or rebound.  Lymphadenopathy:     Head:     Right side of head: No submandibular, tonsillar or occipital adenopathy.     Left side of head: No submandibular, tonsillar or occipital adenopathy.     Cervical: No cervical adenopathy.  Skin:    Coloration: Skin is not pale.     Findings:  No abrasion, erythema, petechiae or rash. Rash is not papular, urticarial or vesicular.     Comments: He has had some urticarial lesions on his bilateral legs as well as his abdomen.  These are flesh-colored, and not erythematous as would be expected.  There are a lot of excoriations present.  Neurological:     Mental Status: He is alert.      Diagnostic studies: labs sent instead     Salvatore Marvel, MD  [facility]

## 2020-02-06 NOTE — Patient Instructions (Addendum)
1. Chronic idiopathic urticaria - Your history is confusing with the history of the Lyme disease. - Your hives do not look classic to me, but we will see how they respond to the antihistamines.  - We will get some labs to rule out serious causes of hives: complete blood count, tryptase level, alpha gal panel, chronic urticaria panel, CMP, ESR, and CRP. - Chronic hives are often times a self limited process and will "burn themselves out" over 6-12 months, although this is not always the case.  - In the meantime, start suppressive dosing of antihistamines:     - Morning: Allegra (fexofenadine) 180mg  (one tablet) + Pepcid (famotidine) 20mg     - Noon: Allegra (fexofenadine) 180mg  (one tablet) + Pepcid (famotidine) 20mg     - Evening: Allegra (fexofenadine) 180mg  (one tablet) + Pepcid (famotidine) 20mg  + Singulair (montelukast) 10mg  - You can change this dosing at home, decreasing the dose as needed or increasing the dosing as needed.  - If you are not tolerating the medications or are tired of taking them every day, we can start treatment with a monthly injectable medication called Xolair.   2. Return in about 3 months (around 05/07/2020).    Please inform us of any Emergency Department visits, hospitalizations, or changes in symptoms. Call us before going to the ED for breathing or allergy symptoms since we might be able to fit you in for a sick visit. Feel free to contact us anytime with any questions, problems, or concerns.  It was a pleasure to meet you today!  Websites that have reliable patient information: 1. American Academy of Asthma, Allergy, and Immunology: www.aaaai.org 2. Food Allergy Research and Education (FARE): foodallergy.org 3. Mothers of Asthmatics: http://www.asthmacommunitynetwork.org 4. American College of Allergy, Asthma, and Immunology: www.acaai.org   COVID-19 Vaccine Information can be found at:  ShippingScam.co.uk For questions related to vaccine distribution or appointments, please email [email or web address] or call [phone].     "Like" [place] on Facebook and Instagram for our latest updates!        Make sure you are registered to vote! If you have moved or changed any of your contact information, you will need to get this updated before voting!  In some cases, you MAY be able to register to vote online: [email or web address]

## 2020-02-10 ENCOUNTER — Other Ambulatory Visit: Payer: Self-pay

## 2020-02-10 ENCOUNTER — Encounter: Payer: Self-pay | Admitting: Cardiovascular Disease

## 2020-02-10 ENCOUNTER — Ambulatory Visit (INDEPENDENT_AMBULATORY_CARE_PROVIDER_SITE_OTHER): Payer: 59 | Admitting: Cardiovascular Disease

## 2020-02-10 VITALS — BP 118/82 | HR 98 | Ht 72.0 in | Wt 233.0 lb

## 2020-02-10 DIAGNOSIS — R0789 Other chest pain: Secondary | ICD-10-CM | POA: Diagnosis not present

## 2020-02-10 NOTE — Progress Notes (Signed)
Mr. [name] returns today for follow-up of his outpatient noninvasive diagnostic test done in to evaluate atypical chest pain and dyspnea.  His 2D echo was normal and his coronary CTA showed a coronary calcium score of 0 without evidence of CAD.  I believe his symptoms are noncardiac and suggested his primary care physician evaluate other potential causes.  I will see him back as needed.  His lipid profile performed 01/03/2020 was excellent with a total cholesterol 132, LDL 69 HDL 27.  He did have a mildly elevated triglyceride level.  Runell Gess, M.D., FACP, [facility], [facility] [address] [place], [place]  [address]  [phone] 02/10/2020 10:11 AM

## 2020-02-10 NOTE — Patient Instructions (Signed)
Medication Instructions:  °Your physician recommends that you continue on your current medications as directed. Please refer to the Current Medication list given to you today. ° °*If you need a refill on your cardiac medications before your next appointment, please call your pharmacy* ° °Lab Work: °NONE ordered at this time of appointment  ° °If you have labs (blood work) drawn today and your tests are completely normal, you will receive your results only by: °MyChart Message (if you have MyChart) OR °A paper copy in the mail °If you have any lab test that is abnormal or we need to change your treatment, we will call you to review the results. ° °Testing/Procedures: °NONE ordered at this time of appointment  ° °Follow-Up: °At [facility], you and your health needs are our priority.  As part of our continuing mission to provide you with exceptional heart care, we have created designated Provider Care Teams.  These Care Teams include your primary Cardiologist (physician) and Advanced Practice Providers (APPs -  Physician Assistants and Nurse Practitioners) who all work together to provide you with the care you need, when you need it. ° °Your next appointment:   °As Needed  ° °The format for your next appointment:   °In Person ° °Provider:   °Jonathan Berry, MD   ° °Other Instructions ° ° °

## 2020-02-11 ENCOUNTER — Encounter: Payer: Self-pay | Admitting: Allergy & Immunology

## 2020-02-12 ENCOUNTER — Other Ambulatory Visit: Payer: Self-pay | Admitting: Cardiovascular Disease

## 2020-02-12 DIAGNOSIS — I10 Essential (primary) hypertension: Secondary | ICD-10-CM

## 2020-02-14 LAB — ALPHA-GAL PANEL
Alpha Gal IgE*: <0.1 kU/L (ref <0.10)
Beef (Bos spp) IgE: <0.1 kU/L (ref <0.35)
Class Interpretation: 0
Class Interpretation: 0
Class Interpretation: 0
Lamb/Mutton (Ovis spp) IgE: <0.1 kU/L (ref <0.35)
Pork (Sus spp) IgE: <0.1 kU/L (ref <0.35)

## 2020-02-14 LAB — CMP14+EGFR
ALT: 16 IU/L (ref 0–44)
AST: 21 IU/L (ref 0–40)
Albumin/Globulin Ratio: 2 (ref 1.2–2.2)
Albumin: 4.7 g/dL (ref 4.0–5.0)
Alkaline Phosphatase: 77 IU/L (ref 48–121)
BUN/Creatinine Ratio: 8 — ABNORMAL LOW (ref 9–20)
BUN: 8 mg/dL (ref 6–24)
Bilirubin Total: 0.3 mg/dL (ref 0.0–1.2)
CO2: 24 mmol/L (ref 20–29)
Calcium: 9.8 mg/dL (ref 8.7–10.2)
Chloride: 104 mmol/L (ref 96–106)
Creatinine, Ser: 1 mg/dL (ref 0.76–1.27)
GFR calc Af Amer: 103 mL/min/{1.73_m2} (ref >59)
GFR calc non Af Amer: 89 mL/min/{1.73_m2} (ref >59)
Globulin, Total: 2.3 g/dL (ref 1.5–4.5)
Glucose: 107 mg/dL — ABNORMAL HIGH (ref 65–99)
Potassium: 4 mmol/L (ref 3.5–5.2)
Sodium: 141 mmol/L (ref 134–144)
Total Protein: 7 g/dL (ref 6.0–8.5)

## 2020-02-14 LAB — CBC WITH DIFFERENTIAL
Basophils Absolute: 0 10*3/uL (ref 0.0–0.2)
Basos: 0 %
EOS (ABSOLUTE): 0.3 10*3/uL (ref 0.0–0.4)
Eos: 3 %
Hematocrit: 45 % (ref 37.5–51.0)
Hemoglobin: 14.9 g/dL (ref 13.0–17.7)
Immature Grans (Abs): 0 10*3/uL (ref 0.0–0.1)
Immature Granulocytes: 0 %
Lymphocytes Absolute: 2.5 10*3/uL (ref 0.7–3.1)
Lymphs: 28 %
MCH: 29.3 pg (ref 26.6–33.0)
MCHC: 33.1 g/dL (ref 31.5–35.7)
MCV: 88 fL (ref 79–97)
Monocytes Absolute: 0.6 10*3/uL (ref 0.1–0.9)
Monocytes: 7 %
Neutrophils Absolute: 5.5 10*3/uL (ref 1.4–7.0)
Neutrophils: 62 %
RBC: 5.09 x10E6/uL (ref 4.14–5.80)
RDW: 12.4 % (ref 11.6–15.4)
WBC: 8.9 10*3/uL (ref 3.4–10.8)

## 2020-02-14 LAB — TRYPTASE: Tryptase: 5.2 ug/L (ref 2.2–13.2)

## 2020-02-14 LAB — SEDIMENTATION RATE: Sed Rate: 47 mm/hr — ABNORMAL HIGH (ref 0–15)

## 2020-02-14 LAB — ANA W/REFLEX IF POSITIVE: Anti Nuclear Antibody (ANA): NEGATIVE

## 2020-02-14 LAB — C-REACTIVE PROTEIN: CRP: 13 mg/L — ABNORMAL HIGH (ref 0–10)

## 2020-02-14 LAB — CHRONIC URTICARIA: cu index: <2.9 (ref <10)

## 2020-02-27 ENCOUNTER — Encounter: Payer: Self-pay | Admitting: Allergy & Immunology

## 2020-02-27 ENCOUNTER — Encounter: Payer: Self-pay | Admitting: Student in an Organized Health Care Education/Training Program

## 2020-02-27 NOTE — Telephone Encounter (Signed)
H pylori IgG, IgA and IgM blood test. It should come up as all in one test. Xolair has been discussed previously. Please just revisit this idea and please let me know if he has any questions. Thank you

## 2020-02-27 NOTE — Telephone Encounter (Signed)
Can you please order h pylori IgG, IgA, and IgM for this patient? Also please review Xolair injections. Thank you

## 2020-03-05 ENCOUNTER — Other Ambulatory Visit: Payer: Self-pay | Admitting: *Deleted

## 2020-03-05 MED ORDER — FEXOFENADINE HCL 180 MG PO TABS
180.0000 mg | ORAL_TABLET | Freq: Three times a day (TID) | ORAL | 5 refills | Status: DC
Start: 2020-03-05 — End: 2021-01-07

## 2020-05-14 ENCOUNTER — Ambulatory Visit (INDEPENDENT_AMBULATORY_CARE_PROVIDER_SITE_OTHER): Payer: 59 | Admitting: Family Medicine

## 2020-05-14 ENCOUNTER — Other Ambulatory Visit: Payer: Self-pay

## 2020-05-14 ENCOUNTER — Ambulatory Visit
Admission: RE | Admit: 2020-05-14 | Discharge: 2020-05-14 | Disposition: A | Discharge status: Home / Self Care | Payer: 59 | Source: Ambulatory Visit | Attending: Family Medicine | Admitting: Family Medicine

## 2020-05-14 VITALS — BP 130/92 | HR 84 | Ht 72.0 in | Wt 237.4 lb

## 2020-05-14 DIAGNOSIS — M5412 Radiculopathy, cervical region: Secondary | ICD-10-CM

## 2020-05-14 MED ORDER — BACLOFEN 20 MG PO TABS: 20.0000 mg | ORAL_TABLET | Freq: Three times a day (TID) | ORAL | 0 refills | Status: DC

## 2020-05-14 MED ORDER — METHYLPREDNISOLONE 4 MG PO TBPK: ORAL_TABLET | ORAL | 0 refills | Status: DC

## 2020-05-14 NOTE — Progress Notes (Signed)
    SUBJECTIVE:   CHIEF COMPLAINT / HPI:   Neck pain: Several months of chronic low level neck pain with radiculopathy that suddenly worsened 5 days ago to the point where the patient cannot unclench his left fist.  Patient complaining of significant neck pain at the cervical region, pain under the armpit, pain going down his arm and inability to extend his fingers.  He does not recall injuring it or suffering any trauma to the area.  He states that 2 weeks ago he turned his neck while driving to look up at stoplight and felt a pop but the pain improved after that day.  He has a history of disc disease in the lumbar spine and recently had hip replaced by the orthopedist with [facility].  He describes the pain as a burning sensation.  The tips of his fingers on the left hand are numb.  When he extends his head backwards he gets tingling down both arms.  PERTINENT  PMH / PSH: Lumbar fusion, degenerative disc disease, hip replacement  OBJECTIVE:   BP (!) 130/92   Pulse 84   Ht 6' (1.829 m)   Wt 237 lb 6.4 oz (107.7 kg)   SpO2 98%   BMI 32.20 kg/m   General: Alert, oriented.  In significant amount of pain. MSK: Left arm is bent 90 degrees at the elbow and left hand is stuck in the flexed position.  Patient tends to extend his fingers but unable to move them more than a few centimeters.  Passive abduction of the left arm causes pain in the axilla.  Patient has tenderness to palpation in the cervical region near C6 and C7.  Limited range of motion with neck rotation and flexion/extension Neuro: Decreased sensation in the fingers of the left hand.  ASSESSMENT/PLAN:   Cervical radiculopathy Patient with a history of lumbar degenerative disc disease and fusion complaining with several months of chronic symptoms of cervical radiculopathy that suddenly worsened over the past 5 days to the point he can no longer on clench his fist or use his left arm, which is fixed in a semiflexed position in front  of his waist.  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  Called patient's orthopedist who did his lumbar fusion for an urgent referral and patient will be seen tomorrow morning at 9 AM.  Have also ordered x-rays and MRI.  Prescribed steroid burst pack and muscle relaxer for pain relief in the meantime.  Advised patient to follow-up with PCP in a few weeks.  Advised to call us if there is any trouble being seen by the orthopedist tomorrow.     Sandre Kitty, MD [facility]

## 2020-05-14 NOTE — Patient Instructions (Signed)
I have called and scheduled you an appointment at your orthopedist's office for tomorrow at 9am.    I have prescribed a steroid dose pack for you.  Take as directed on the box.    I have given you muscle relaxants.  Please don't drive after taking these medications.   You can go to the [place] imaging center at any time to get the x ray.    Have a great day,   Frederic Jericho, MD

## 2020-05-14 NOTE — Assessment & Plan Note (Signed)
Patient with a history of lumbar degenerative disc disease and fusion complaining with several months of chronic symptoms of cervical radiculopathy that suddenly worsened over the past 5 days to the point he can no longer on clench his fist or use his left arm, which is fixed in a semiflexed position in front of his waist.  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  Called patient's orthopedist who did his lumbar fusion for an urgent referral and patient will be seen tomorrow morning at 9 AM.  Have also ordered x-rays and MRI.  Prescribed steroid burst pack and muscle relaxer for pain relief in the meantime.  Advised patient to follow-up with PCP in a few weeks.  Advised to call us if there is any trouble being seen by the orthopedist tomorrow.

## 2020-05-21 DIAGNOSIS — K859 Acute pancreatitis without necrosis or infection, unspecified: Secondary | ICD-10-CM | POA: Insufficient documentation

## 2020-06-03 NOTE — Progress Notes (Signed)
    SUBJECTIVE:   CHIEF COMPLAINT / HPI: sinus pain   Sinus pain Patient reports he had 4 days of sinus pain.  Endorses fever 3 days ago 100.5-101 that has since resolved.  Reports some nausea without vomiting.  Also endorses headaches which she cannot distinguish if this is acute as he has a history of chronic headaches.  He has had some nasal discharge yellow initially that has turned to clear.  Denies any cough shortness of breath or chest pain.  Has not had any Covid vaccines.  Recently tested for Covid which was negative.  Reports that wife was recently seen in the ED and was given antibiotics for sinusitis.  He thinks he may have contracted sinusitis from her.  He reports having taken Tylenol 500 mg twice daily.    PERTINENT  PMH / PSH:  Disseminated Lyme disease Chronic pain Tobacco use  OBJECTIVE:   Pulse 98   Temp 98.2 F (36.8 C) (Oral)   SpO2 98%    General: Alert, no acute distress HEENT: TMs visible bilaterally, nasal turbinates normal, no nasal discharge noted.  No tenderness over maxillary or frontal sinuses. Cardio: Normal S1 and S2, RRR, no r/m/g Pulm: CTAB, normal work of breathing Abdomen: Bowel sounds normal. Abdomen soft and non-tender.  Extremities: No peripheral edema.    ASSESSMENT/PLAN:   Sinusitis Likely viral given symptoms seem to be resolving. We will try Flonase If symptoms worsen or fevers recur have advised patient to let me know and we can initiate antibiotics at that time. Follow-up with PCP as needed      Dana Allan, MD [facility]

## 2020-06-03 NOTE — Patient Instructions (Signed)
Thank you for coming to see me today. It was a pleasure.   Start Flonase nasal spray  Daily  If you develop fevers>100.5, shortness of breath, chest pain, palpitations, dizziness, abdominal pain, nausea, vomiting, diarrhea or cannot eat or drink then please go to the ER immediately.  Please follow-up with PCP as neede  If you have any questions or concerns, please do not hesitate to call the office at [phone].  Best,   Dana Allan, MD    Sinusitis, Adult Sinusitis is soreness and swelling (inflammation) of your sinuses. Sinuses are hollow spaces in the bones around your face. They are located:  Around your eyes.  In the middle of your forehead.  Behind your nose.  In your cheekbones. Your sinuses and nasal passages are lined with a fluid called mucus. Mucus drains out of your sinuses. Swelling can trap mucus in your sinuses. This lets germs (bacteria, virus, or fungus) grow, which leads to infection. Most of the time, this condition is caused by a virus. What are the causes? This condition is caused by:  Allergies.  Asthma.  Germs.  Things that block your nose or sinuses.  Growths in the nose (nasal polyps).  Chemicals or irritants in the air.  Fungus (rare). What increases the risk? You are more likely to develop this condition if:  You have a weak body defense system (immune system).  You do a lot of swimming or diving.  You use nasal sprays too much.  You smoke. What are the signs or symptoms? The main symptoms of this condition are pain and a feeling of pressure around the sinuses. Other symptoms include:  Stuffy nose (congestion).  Runny nose (drainage).  Swelling and warmth in the sinuses.  Headache.  Toothache.  A cough that may get worse at night.  Mucus that collects in the throat or the back of the nose (postnasal drip).  Being unable to smell and taste.  Being very tired (fatigue).  A fever.  Sore throat.  Bad breath. How  is this diagnosed? This condition is diagnosed based on:  Your symptoms.  Your medical history.  A physical exam.  Tests to find out if your condition is short-term (acute) or long-term (chronic). Your doctor may: ? Check your nose for growths (polyps). ? Check your sinuses using a tool that has a light (endoscope). ? Check for allergies or germs. ? Do imaging tests, such as an MRI or CT scan. How is this treated? Treatment for this condition depends on the cause and whether it is short-term or long-term.  If caused by a virus, your symptoms should go away on their own within 10 days. You may be given medicines to relieve symptoms. They include: ? Medicines that shrink swollen tissue in the nose. ? Medicines that treat allergies (antihistamines). ? A spray that treats swelling of the nostrils. ? Rinses that help get rid of thick mucus in your nose (nasal saline washes).  If caused by bacteria, your doctor may wait to see if you will get better without treatment. You may be given antibiotic medicine if you have: ? A very bad infection. ? A weak body defense system.  If caused by growths in the nose, you may need to have surgery. Follow these instructions at home: Medicines  Take, use, or apply over-the-counter and prescription medicines only as told by your doctor. These may include nasal sprays.  If you were prescribed an antibiotic medicine, take it as told by your doctor.  Do not stop taking the antibiotic even if you start to feel better. Hydrate and humidify   Drink enough water to keep your pee (urine) pale yellow.  Use a cool mist humidifier to keep the humidity level in your home above 50%.  Breathe in steam for 10-15 minutes, 3-4 times a day, or as told by your doctor. You can do this in the bathroom while a hot shower is running.  Try not to spend time in cool or dry air. Rest  Rest as much as you can.  Sleep with your head raised (elevated).  Make sure you  get enough sleep each night. General instructions   Put a warm, moist washcloth on your face 3-4 times a day, or as often as told by your doctor. This will help with discomfort.  Wash your hands often with soap and water. If there is no soap and water, use hand sanitizer.  Do not smoke. Avoid being around people who are smoking (secondhand smoke).  Keep all follow-up visits as told by your doctor. This is important. Contact a doctor if:  You have a fever.  Your symptoms get worse.  Your symptoms do not get better within 10 days. Get help right away if:  You have a very bad headache.  You cannot stop throwing up (vomiting).  You have very bad pain or swelling around your face or eyes.  You have trouble seeing.  You feel confused.  Your neck is stiff.  You have trouble breathing. Summary  Sinusitis is swelling of your sinuses. Sinuses are hollow spaces in the bones around your face.  This condition is caused by tissues in your nose that become inflamed or swollen. This traps germs. These can lead to infection.  If you were prescribed an antibiotic medicine, take it as told by your doctor. Do not stop taking it even if you start to feel better.  Keep all follow-up visits as told by your doctor. This is important. This information is not intended to replace advice given to you by your health care provider. Make sure you discuss any questions you have with your health care provider. Document Revised: 10/16/2017 Document Reviewed: 10/16/2017 Elsevier Patient Education  2020 [employer].

## 2020-06-04 ENCOUNTER — Other Ambulatory Visit: Payer: Self-pay

## 2020-06-04 ENCOUNTER — Ambulatory Visit (INDEPENDENT_AMBULATORY_CARE_PROVIDER_SITE_OTHER): Payer: 59 | Admitting: Family Medicine

## 2020-06-04 DIAGNOSIS — J019 Acute sinusitis, unspecified: Secondary | ICD-10-CM

## 2020-06-04 MED ORDER — FLUTICASONE PROPIONATE 50 MCG/ACT NA SUSP: 2.0000 | Freq: Every day | NASAL | 6 refills | Status: DC

## 2020-06-07 ENCOUNTER — Encounter (HOSPITAL_COMMUNITY): Payer: Self-pay | Admitting: Emergency Medicine

## 2020-06-07 ENCOUNTER — Emergency Department (HOSPITAL_COMMUNITY): Payer: 59

## 2020-06-07 ENCOUNTER — Other Ambulatory Visit: Payer: Self-pay | Admitting: Family Medicine

## 2020-06-07 ENCOUNTER — Other Ambulatory Visit: Payer: Self-pay

## 2020-06-07 ENCOUNTER — Emergency Department (HOSPITAL_COMMUNITY)
Admission: EM | Admit: 2020-06-07 | Discharge: 2020-06-07 | Disposition: A | Discharge status: Home / Self Care | Payer: 59 | Attending: Emergency Medicine | Admitting: Emergency Medicine

## 2020-06-07 ENCOUNTER — Encounter: Payer: Self-pay | Admitting: Family Medicine

## 2020-06-07 ENCOUNTER — Telehealth: Payer: Self-pay | Admitting: Family Medicine

## 2020-06-07 DIAGNOSIS — R197 Diarrhea, unspecified: Secondary | ICD-10-CM | POA: Insufficient documentation

## 2020-06-07 DIAGNOSIS — R112 Nausea with vomiting, unspecified: Secondary | ICD-10-CM | POA: Insufficient documentation

## 2020-06-07 DIAGNOSIS — Z20822 Contact with and (suspected) exposure to covid-19: Secondary | ICD-10-CM | POA: Diagnosis not present

## 2020-06-07 DIAGNOSIS — R0602 Shortness of breath: Secondary | ICD-10-CM | POA: Diagnosis not present

## 2020-06-07 DIAGNOSIS — R509 Fever, unspecified: Secondary | ICD-10-CM | POA: Diagnosis not present

## 2020-06-07 DIAGNOSIS — Z5321 Procedure and treatment not carried out due to patient leaving prior to being seen by health care provider: Secondary | ICD-10-CM | POA: Insufficient documentation

## 2020-06-07 DIAGNOSIS — J329 Chronic sinusitis, unspecified: Secondary | ICD-10-CM | POA: Insufficient documentation

## 2020-06-07 LAB — CBC
HCT: 42.7 % (ref 39.0–52.0)
Hemoglobin: 14.4 g/dL (ref 13.0–17.0)
MCH: 30.4 pg (ref 26.0–34.0)
MCHC: 33.7 g/dL (ref 30.0–36.0)
MCV: 90.1 fL (ref 80.0–100.0)
Platelets: 207 10*3/uL (ref 150–400)
RBC: 4.74 MIL/uL (ref 4.22–5.81)
RDW: 12.4 % (ref 11.5–15.5)
WBC: 9.1 10*3/uL (ref 4.0–10.5)
nRBC: 0 % (ref 0.0–0.2)

## 2020-06-07 LAB — COMPREHENSIVE METABOLIC PANEL
ALT: 21 U/L (ref 0–44)
AST: 27 U/L (ref 15–41)
Albumin: 4.1 g/dL (ref 3.5–5.0)
Alkaline Phosphatase: 54 U/L (ref 38–126)
Anion gap: 9 (ref 5–15)
BUN: 10 mg/dL (ref 6–20)
CO2: 25 mmol/L (ref 22–32)
Calcium: 9.3 mg/dL (ref 8.9–10.3)
Chloride: 107 mmol/L (ref 98–111)
Creatinine, Ser: 0.98 mg/dL (ref 0.61–1.24)
GFR, Estimated: >60 mL/min (ref >60)
Glucose, Bld: 110 mg/dL — ABNORMAL HIGH (ref 70–99)
Potassium: 3.7 mmol/L (ref 3.5–5.1)
Sodium: 141 mmol/L (ref 135–145)
Total Bilirubin: 0.7 mg/dL (ref 0.3–1.2)
Total Protein: 7 g/dL (ref 6.5–8.1)

## 2020-06-07 LAB — URINALYSIS, ROUTINE W REFLEX MICROSCOPIC
Bacteria, UA: NONE SEEN
Glucose, UA: NEGATIVE mg/dL
Ketones, ur: NEGATIVE mg/dL
Leukocytes,Ua: NEGATIVE
Nitrite: NEGATIVE
Protein, ur: NEGATIVE mg/dL
Specific Gravity, Urine: 1.031 — ABNORMAL HIGH (ref 1.005–1.030)
pH: 5 (ref 5.0–8.0)

## 2020-06-07 LAB — RESP PANEL BY RT-PCR (FLU A&B, COVID) ARPGX2
Influenza A by PCR: NEGATIVE
Influenza B by PCR: NEGATIVE
SARS Coronavirus 2 by RT PCR: NEGATIVE

## 2020-06-07 LAB — LIPASE, BLOOD: Lipase: 193 U/L — ABNORMAL HIGH (ref 11–51)

## 2020-06-07 NOTE — Telephone Encounter (Signed)
Patient calls OOH line because he is feeling worse since clinic visit on 06/04/20 with sweating, subjective fevers Tmax 99 and vomiting x 3 times since 4.30 am today. He was diagnosed with sinusitis at clinic visit and was recommended supportive care. He has had on going symptoms since before Christmas such as subjective fevers, sweating, headaches, facial soreness etc.  Tested negative for Covid a few weeks ago and she was negative.  Has been taking tylenol and flonase without much improvement in symptoms. He has oxycodone for neck pain which he took last night. Wife was recently diagnosed with sinusitis and treated with Amoxicillin. Has phenergan at home but has not taken this yet. Denies chest pain, dizziness and dyspnea. I explained that the sinusitis is most likely viral in eitiology and I am more concerned about his hydration status. Recommended him to take phenergan and to encourage PO intake. If he keeps vomiting despite phenergan would recommend going to the ER. Return precautions give. Arranged follow up in [facility] tomorrow at 2:20pm. Could consider starting antibiotics if no improvement in symptoms.  Will forward to Dr Clent Ridges who saw pt last week and PCP.  Towanda Octave MD PGY-2, Family Medicine

## 2020-06-07 NOTE — ED Triage Notes (Signed)
Pt reports vomiting since 4:30am.  Took Phenergan and now has dry heaves.  States he has been sick since Thanksgiving.  Seen by PCP on 1/6 and diagnosed with sinus infection.  States he didn't receive an antibiotic.  Reports intermittent fevers, nausea, vomiting, diarrhea, and SOB when lying.  History of lyme disease.  Negative COVID test 1 week ago.

## 2020-06-07 NOTE — ED Notes (Signed)
Pt stated he was leaving. 

## 2020-06-07 NOTE — Assessment & Plan Note (Signed)
Likely viral given symptoms seem to be resolving. We will try Flonase If symptoms worsen or fevers recur have advised patient to let me know and we can initiate antibiotics at that time. Follow-up with PCP as needed

## 2020-06-08 ENCOUNTER — Ambulatory Visit (INDEPENDENT_AMBULATORY_CARE_PROVIDER_SITE_OTHER): Payer: 59 | Admitting: Family Medicine

## 2020-06-08 VITALS — BP 124/78 | HR 117 | Ht 72.0 in

## 2020-06-08 DIAGNOSIS — R1011 Right upper quadrant pain: Secondary | ICD-10-CM

## 2020-06-08 MED ORDER — PROMETHAZINE HCL 12.5 MG PO TABS: 12.5000 mg | ORAL_TABLET | Freq: Three times a day (TID) | ORAL | 0 refills | Status: DC | PRN

## 2020-06-08 NOTE — Progress Notes (Signed)
    SUBJECTIVE:   CHIEF COMPLAINT / HPI:  The patient has been having vomiting the past few weeks.  He wen to the ED on 1/9 out of concern for dehydrtion due to vomiting.  He has a hx of pancreatitis. He initially thought his vomiting was due to his sinusitis but that improved and he still had vomiting.  He also complains of epigastric and ruq pain.  Pain is worse when he eats.  Sometimes it radiates to his back.  Nausea is worse when he lays on his back.  He has had 3-4 episodes of yellow, nonbloody vomit daily.    PERTINENT  PMH / PSH: pancreatitis  OBJECTIVE:   BP 124/78   Pulse (!) 117   Ht 6' (1.829 m)   SpO2 96%   BMI 31.19 kg/m   Gen: alert oriented.  Mild discomfort.   Heent: perrla.  Moist oral mucosa.  CV: rrr. No murmurs Pulm: lctab Gi: soft, normal bowel sounds.  TTP in the epigastric and RUQ   ASSESSMENT/PLAN:   RUQ pain Vomiting and RUQ/epigastric pain for past two weeks. Worse with eating.  Has had pancreatitis in the recent past. Symptoms and physical exam more consistent with biliary colic but pancreatitis also possible given his recent labs with elevated lipase.  Possibility that he is having gallstone pancreatitis.  Will check lft's and lipase and white count.  Will get RUQ u/s.  Refer to gen surg if necessary.  Will send in Rx for phenergan.      Sandre Kitty, MD [facility]

## 2020-06-08 NOTE — Patient Instructions (Signed)
It was nice to see you again today,  I think your pain is coming from gallstones.  Whether or not these gallstones are also causing pancreatitis, I cannot say for sure.  I am getting some blood work today and ordering a right upper quadrant ultrasound for you.  Someone will let you know when this is scheduled.  I will call you with the results of the blood tests when I get them.  If you start to get fever or feel like signs of infection, please call us or go to the emergency department as you can develop an infection from gallstones.  I have prescribed you Phenergan for nausea.  Please limit fatty foods until we figure out what is going on.  Have a great day,  Frederic Jericho, MD

## 2020-06-09 DIAGNOSIS — R1011 Right upper quadrant pain: Secondary | ICD-10-CM | POA: Insufficient documentation

## 2020-06-09 LAB — CBC
Hematocrit: 42.8 % (ref 37.5–51.0)
Hemoglobin: 14.2 g/dL (ref 13.0–17.7)
MCH: 29.5 pg (ref 26.6–33.0)
MCHC: 33.2 g/dL (ref 31.5–35.7)
MCV: 89 fL (ref 79–97)
Platelets: 218 10*3/uL (ref 150–450)
RBC: 4.81 x10E6/uL (ref 4.14–5.80)
RDW: 12.3 % (ref 11.6–15.4)
WBC: 9.6 10*3/uL (ref 3.4–10.8)

## 2020-06-09 LAB — COMPREHENSIVE METABOLIC PANEL
ALT: 17 IU/L (ref 0–44)
AST: 22 IU/L (ref 0–40)
Albumin/Globulin Ratio: 1.8 (ref 1.2–2.2)
Albumin: 4.4 g/dL (ref 4.0–5.0)
Alkaline Phosphatase: 62 IU/L (ref 44–121)
BUN/Creatinine Ratio: 12 (ref 9–20)
BUN: 11 mg/dL (ref 6–24)
Bilirubin Total: 0.3 mg/dL (ref 0.0–1.2)
CO2: 24 mmol/L (ref 20–29)
Calcium: 9 mg/dL (ref 8.7–10.2)
Chloride: 103 mmol/L (ref 96–106)
Creatinine, Ser: 0.93 mg/dL (ref 0.76–1.27)
GFR calc Af Amer: 112 mL/min/{1.73_m2} (ref >59)
GFR calc non Af Amer: 97 mL/min/{1.73_m2} (ref >59)
Globulin, Total: 2.5 g/dL (ref 1.5–4.5)
Glucose: 65 mg/dL (ref 65–99)
Potassium: 3.9 mmol/L (ref 3.5–5.2)
Sodium: 141 mmol/L (ref 134–144)
Total Protein: 6.9 g/dL (ref 6.0–8.5)

## 2020-06-09 LAB — LIPASE: Lipase: 17 U/L (ref 13–78)

## 2020-06-09 NOTE — Assessment & Plan Note (Addendum)
Vomiting and RUQ/epigastric pain for past two weeks. Worse with eating.  Has had pancreatitis in the recent past. Symptoms and physical exam more consistent with biliary colic but pancreatitis also possible given his recent labs with elevated lipase.  Possibility that he is having gallstone pancreatitis.  Will check lft's and lipase and white count.  Will get RUQ u/s.  Refer to gen surg if necessary.  Will send in Rx for phenergan.

## 2020-06-10 ENCOUNTER — Telehealth: Payer: Self-pay | Admitting: *Deleted

## 2020-06-10 NOTE — Telephone Encounter (Signed)
LVM for pt to call office just to verify that he is aware of his appointment for the [place] that was ordered. It is scheduled on 06/24/2020 @ [facility] the [address] location. Will mail a reminder with information. Khilee Hendricksen Zimmerman Rumple, CMA

## 2020-06-21 ENCOUNTER — Encounter (HOSPITAL_COMMUNITY): Payer: Self-pay | Admitting: *Deleted

## 2020-06-21 ENCOUNTER — Other Ambulatory Visit: Payer: Self-pay

## 2020-06-21 ENCOUNTER — Ambulatory Visit (HOSPITAL_COMMUNITY)
Admission: EM | Admit: 2020-06-21 | Discharge: 2020-06-21 | Disposition: A | Discharge status: Home / Self Care | Payer: 59

## 2020-06-21 DIAGNOSIS — M5412 Radiculopathy, cervical region: Secondary | ICD-10-CM

## 2020-06-21 DIAGNOSIS — M62838 Other muscle spasm: Secondary | ICD-10-CM | POA: Diagnosis not present

## 2020-06-21 HISTORY — DX: Lyme disease, unspecified: A69.20

## 2020-06-21 MED ORDER — METHYLPREDNISOLONE ACETATE 80 MG/ML IJ SUSP: 80.0000 mg | Freq: Once | INTRAMUSCULAR | Status: DC

## 2020-06-21 MED ORDER — TIZANIDINE HCL 2 MG PO TABS: 2.0000 mg | ORAL_TABLET | Freq: Three times a day (TID) | ORAL | 0 refills | Status: DC | PRN

## 2020-06-21 MED ORDER — ACETAMINOPHEN 500 MG PO TABS: 500.0000 mg | ORAL_TABLET | Freq: Four times a day (QID) | ORAL | 0 refills | Status: DC | PRN

## 2020-06-21 NOTE — Discharge Instructions (Signed)
Tizanidine as needed as a muscle relaxer, don't take at the same time as your oxycodone to prevent oversedation. If you tolerate this medication you can take up to 4mg  every 8 hours.  You can add in tylenol every 6 hours as well which can be helpful with pain, particularly if you are already taking narcotics.  Light activity as tolerated.  I did not pursue any xray today as you had an MRI two months ago and there was not a specific new traumatic injury or change which seems likely to present on xray.  Follow up with your orthopedist for recheck and long term management.

## 2020-06-21 NOTE — ED Triage Notes (Addendum)
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  States started with left lateral neck pain onset last night - states talked w/ ortho on-call - was told to get XR.  Pt states he normally has parasthesias in LUE.  LUE fingers warm, pink, with prompt cap refill.  Baclofen - states "way too strong".  Pt requesting guidance with his oxycodone dosing and possibly a muscle relaxer.

## 2020-06-21 NOTE — ED Provider Notes (Signed)
[facility]    CSN: [id number] Arrival date & time: 06/21/20  1059      History   Chief Complaint Chief Complaint  Patient presents with  . Neck Pain    HPI [name] is a 49 y.o. male.   [name] presents with complaints of acute on chronic left neck/ trapezius muscle pain and spasm. He cleaned his counter yesterday and noted increased discomfort following that activity. Woke this morning with significant pain, as well as spasm and clenching of left hand. He has extensive orthopedic history, and has had similar in the past with his neck and hand. He takes oxycodone, he took 10mg  of oxycodone this morning, which does help some but has not allowed for resolution of pain. He feels that he experiences too significant of side effects with oxycodone so would prefer not to have to take it so regularly. Has been provided baclofen in the past but he felt it too caused too much side effects. He had a neck MRI two months ago with his orthopedist. History of disseminated lyme disease and ddd.     ROS per HPI, negative if not otherwise mentioned.      Past Medical History:  Diagnosis Date  . Angio-edema   . Anxiety   . Disseminated Lyme disease   . Headache   . Lyme disease   . [facility] spotted fever   . Urticaria     Patient Active Problem List   Diagnosis Date Noted  . RUQ pain 06/09/2020  . Sinusitis 06/07/2020  . Cervical radiculopathy 05/14/2020  . Acute epigastric pain 02/02/2020  . Rash of body 01/09/2020  . Essential hypertension 01/03/2020  . Atypical chest pain 01/03/2020  . Vitamin D deficiency 11/26/2019  . Fatigue 11/26/2019  . Hyperglycemia 11/26/2019  . Need for hepatitis C screening test 11/26/2019  . Pain, dental 10/26/2019  . Tobacco use 10/11/2019  . Lyme disease   . Hyperlipemia   . Urticaria 04/30/2018  . S/P lumbar fusion 11/15/2017    Past Surgical History:  Procedure Laterality Date  . ADENOIDECTOMY    .  BACK SURGERY    . HIP SURGERY    . SACROILIAC JOINT FUSION Right 11/15/2017   Procedure: SACROILIAC JOINT FUSION;  Surgeon: 11/17/2017, MD;  Location: [facility] OR;  Service: Orthopedics;  Laterality: Right;  120 mins  . SACROILIAC JOINT FUSION Left 07/25/2018   Procedure: SACROILIAC JOINT FUSION;  Surgeon: 07/27/2018, MD;  Location: [facility] OR;  Service: Orthopedics;  Laterality: Left;  SACROILIAC JOINT FUSION  . TONSILLECTOMY     50 years old       Home Medications    Prior to Admission medications   Medication Sig Start Date End Date Taking? Authorizing Provider  acetaminophen (TYLENOL) 500 MG tablet Take 1 tablet (500 mg total) by mouth every 6 (six) hours as needed. 06/21/20  Yes Burky, 06/23/20 B, NP  ascorbic acid (VITAMIN C) 500 MG tablet Take 1,000 mg by mouth daily.   Yes [provider]  famotidine (PEPCID) 20 MG tablet Take 1 tablet 3 times daily. 02/06/20  Yes 04/07/20, MD  fexofenadine (ALLEGRA) 180 MG tablet Take 1 tablet (180 mg total) by mouth in the morning, at noon, and at bedtime. Take 1 tablet 3 times daily. 03/05/20  Yes 05/05/20, MD  fluticasone [facility]) 50 MCG/ACT nasal spray Place 2 sprays into both nostrils daily. 06/04/20  Yes 08/02/20, MD  montelukast (  SINGULAIR) 10 MG tablet Take 1 tablet (10 mg total) by mouth at bedtime. 02/06/20  Yes Alfonse Spruce, MD  Oxycodone HCl 10 MG TABS Take by mouth.   Yes [provider]  promethazine (PHENERGAN) 12.5 MG tablet Take 1 tablet (12.5 mg total) by mouth every 8 (eight) hours as needed for nausea or vomiting. 06/08/20  Yes Sandre Kitty, MD  rosuvastatin (CRESTOR) 20 MG tablet Take 1 tablet (20 mg total) by mouth daily. 11/25/19  Yes Anderson, Chelsey L, DO  tiZANidine (ZANAFLEX) 2 MG tablet Take 1-2 tablets (2-4 mg total) by mouth every 8 (eight) hours as needed for muscle spasms. 06/21/20  Yes Linus Mako B, NP  valsartan (DIOVAN) 40 MG tablet TAKE 1 TABLET(40 MG) BY MOUTH  DAILY 02/12/20  Yes Runell Gess, MD  baclofen (LIORESAL) 20 MG tablet Take 1 tablet (20 mg total) by mouth 3 (three) times daily. 05/14/20   Sandre Kitty, MD  diphenhydrAMINE (BENADRYL) 25 MG tablet Take 25 mg by mouth 2 (two) times daily as needed for itching or allergies.    [provider]  hydrOXYzine (ATARAX/VISTARIL) 25 MG tablet Take 1 tablet (25 mg total) by mouth 3 (three) times daily as needed for itching. 01/27/20   Meccariello, Solmon Ice, DO  methylPREDNISolone (MEDROL DOSEPAK) 4 MG TBPK tablet Please take as instructed on the box 05/14/20   Sandre Kitty, MD  oxycodone (OXY-IR) 5 MG capsule Take 5 mg by mouth daily.    [provider]  triamcinolone (NASACORT ALLERGY 24HR) 55 MCG/ACT AERO nasal inhaler Place 1 spray into the nose daily.    [provider]  VITAMIN E PO Take 2 tablets by mouth daily.    [provider]  Cetirizine HCl 10 MG CAPS Take 10 mg by mouth 2 (two) times daily.   10/05/19  [provider]  mupirocin nasal ointment (BACTROBAN) 2 % Apply to rash Patient not taking: Reported on 10/05/2019 11/11/18 10/05/19  Arlyn Dunning, PA-C    Family History Family History  Problem Relation Age of Onset  . Heart disease Mother   . Heart disease Father     Social History Social History   Tobacco Use  . Smoking status: Current Every Day Smoker    Packs/day: 0.50    Years: 30.00    Pack years: 15.00    Types: Cigarettes  . Smokeless tobacco: Never Used  Vaping Use  . Vaping Use: Never used  Substance Use Topics  . Alcohol use: Not Currently    Comment: stopped 2018  . Drug use: No     Allergies   Pregabalin, Sulfa antibiotics, and Gabapentin   Review of Systems Review of Systems   Physical Exam Triage Vital Signs ED Triage Vitals  Enc Vitals Group     BP 06/21/20 1129 134/82     Pulse Rate 06/21/20 1129 86     Resp 06/21/20 1129 20     Temp 06/21/20 1129 98 F (36.7 C)     Temp Source 06/21/20  1129 Oral     SpO2 06/21/20 1129 98 %     Weight --      Height --      Head Circumference --      Peak Flow --      Pain Score 06/21/20 1130 9     Pain Loc --      Pain Edu? --      Excl. in GC? --  No data found.  Updated Vital Signs BP 134/82   Pulse 86   Temp 98 F (36.7 C) (Oral)   Resp 20   SpO2 98%   Visual Acuity Right Eye Distance:   Left Eye Distance:   Bilateral Distance:    Right Eye Near:   Left Eye Near:    Bilateral Near:     Physical Exam Constitutional:      Appearance: He is well-developed.  Cardiovascular:     Rate and Rhythm: Normal rate.  Pulmonary:     Effort: Pulmonary effort is normal.  Musculoskeletal:     Cervical back: Tenderness present. No bony tenderness. Pain with movement present.       Back:     Comments: Left hand is clenched out of control of patient; pain and limitation to left shoulder related to spasm and pain to left trapezius; no new or worsening of neck spinous process pain or tenderness on palpation; rotating head without apparent difficulty but engagement of left trapezius causes pain. cap refill < 2 seconds to left hand  Skin:    General: Skin is warm and dry.  Neurological:     Mental Status: He is alert and oriented to person, place, and time.      UC Treatments / Results  Labs (all labs ordered are listed, but only abnormal results are displayed) Labs Reviewed - No data to display  EKG   Radiology No results found.  Procedures Procedures (including critical care time)  Medications Ordered in UC Medications  methylPREDNISolone acetate (DEPO-MEDROL) injection 80 mg (has no administration in time range)    Initial Impression / Assessment and Plan / UC Course  I have reviewed the triage vital signs and the nursing notes.  Pertinent labs & imaging results that were available during my care of the patient were reviewed by me and considered in my medical decision making (see chart for details).      On chart review and according to patient, this is not a new presentation, unfortunately, for him. Extensive history and is following with orthopedics. Significant muscle spasms noted today. im depomedrol provided as well as tizanidine provided. Deferred imaging at this time- no new neurological symptoms and no new traumatic injury. MRI only two months ago. Pain management discussed at length. Encouraged to follow with orthopedist next week. Final Clinical Impressions(s) / UC Diagnoses   Final diagnoses:  Trapezius muscle spasm  Cervical radiculopathy     Discharge Instructions     Tizanidine as needed as a muscle relaxer, don't take at the same time as your oxycodone to prevent oversedation. If you tolerate this medication you can take up to 4mg  every 8 hours.  You can add in tylenol every 6 hours as well which can be helpful with pain, particularly if you are already taking narcotics.  Light activity as tolerated.  I did not pursue any xray today as you had an MRI two months ago and there was not a specific new traumatic injury or change which seems likely to present on xray.  Follow up with your orthopedist for recheck and long term management.    ED Prescriptions    Medication Sig Dispense Auth. Provider   tiZANidine (ZANAFLEX) 2 MG tablet Take 1-2 tablets (2-4 mg total) by mouth every 8 (eight) hours as needed for muscle spasms. 20 tablet B, NP   acetaminophen (TYLENOL) 500 MG tablet Take 1 tablet (500 mg total) by mouth every 6 (six) hours as  needed. 30 tablet Georgetta HaberBurky, Natalie B, NP     PDMP not reviewed this encounter.   Georgetta HaberBurky, Natalie B, NP 06/21/20 [phone]

## 2020-06-24 ENCOUNTER — Ambulatory Visit
Admission: RE | Admit: 2020-06-24 | Discharge: 2020-06-24 | Disposition: A | Discharge status: Home / Self Care | Payer: 59 | Source: Ambulatory Visit | Attending: Family Medicine | Admitting: Family Medicine

## 2020-06-24 ENCOUNTER — Other Ambulatory Visit: Payer: Self-pay | Admitting: Family Medicine

## 2020-06-24 DIAGNOSIS — R1011 Right upper quadrant pain: Secondary | ICD-10-CM

## 2020-07-18 ENCOUNTER — Other Ambulatory Visit: Payer: Self-pay

## 2020-07-18 ENCOUNTER — Encounter (HOSPITAL_COMMUNITY): Payer: Self-pay

## 2020-07-18 ENCOUNTER — Ambulatory Visit (HOSPITAL_COMMUNITY)
Admission: EM | Admit: 2020-07-18 | Discharge: 2020-07-18 | Disposition: A | Discharge status: Home / Self Care | Payer: 59 | Attending: Internal Medicine | Admitting: Internal Medicine

## 2020-07-18 DIAGNOSIS — J029 Acute pharyngitis, unspecified: Secondary | ICD-10-CM | POA: Insufficient documentation

## 2020-07-18 DIAGNOSIS — Z20822 Contact with and (suspected) exposure to covid-19: Secondary | ICD-10-CM | POA: Diagnosis not present

## 2020-07-18 DIAGNOSIS — F17210 Nicotine dependence, cigarettes, uncomplicated: Secondary | ICD-10-CM | POA: Insufficient documentation

## 2020-07-18 LAB — POCT RAPID STREP A, ED / UC: Streptococcus, Group A Screen (Direct): NEGATIVE

## 2020-07-18 LAB — SARS CORONAVIRUS 2 (TAT 6-24 HRS): SARS Coronavirus 2: NEGATIVE

## 2020-07-18 MED ORDER — DIPHENHYDRAMINE HCL 12.5 MG/5ML PO LIQD
5.0000 mL | Freq: Four times a day (QID) | ORAL | 0 refills | Status: DC | PRN
Start: 2020-07-18 — End: 2020-07-20

## 2020-07-18 NOTE — ED Provider Notes (Signed)
[facility]    CSN: [id number] Arrival date & time: 07/18/20  1007      History   Chief Complaint Chief Complaint  Patient presents with  . Sore Throat    HPI [name] is a 50 y.o. male.   Pt complains of sore throat that started about three days ago.  Also complains of postnasal drip and congestion, subjective fever.  Denies cough, shortness of breath, n/v/d.  He has been using Flonase, allergy medication, and Mucinex with minimal relief.  He is not vaccinated against COVID.  He reports his dad has similar sx.      Past Medical History:  Diagnosis Date  . Angio-edema   . Anxiety   . Disseminated Lyme disease   . Headache   . Lyme disease   . [facility] spotted fever   . Urticaria     Patient Active Problem List   Diagnosis Date Noted  . RUQ pain 06/09/2020  . Sinusitis 06/07/2020  . Cervical radiculopathy 05/14/2020  . Acute epigastric pain 02/02/2020  . Rash of body 01/09/2020  . Essential hypertension 01/03/2020  . Atypical chest pain 01/03/2020  . Vitamin D deficiency 11/26/2019  . Fatigue 11/26/2019  . Hyperglycemia 11/26/2019  . Need for hepatitis C screening test 11/26/2019  . Pain, dental 10/26/2019  . Tobacco use 10/11/2019  . Lyme disease   . Hyperlipemia   . Urticaria 04/30/2018  . S/P lumbar fusion 11/15/2017    Past Surgical History:  Procedure Laterality Date  . ADENOIDECTOMY    . BACK SURGERY    . HIP SURGERY    . SACROILIAC JOINT FUSION Right 11/15/2017   Procedure: SACROILIAC JOINT FUSION;  Surgeon: Venita Lick, MD;  Location: [facility] OR;  Service: Orthopedics;  Laterality: Right;  120 mins  . SACROILIAC JOINT FUSION Left 07/25/2018   Procedure: SACROILIAC JOINT FUSION;  Surgeon: Venita Lick, MD;  Location: [facility] OR;  Service: Orthopedics;  Laterality: Left;  SACROILIAC JOINT FUSION  . TONSILLECTOMY     50 years old       Home Medications    Prior to Admission medications   Medication Sig Start Date End  Date Taking? Authorizing Provider  acetaminophen (TYLENOL) 500 MG tablet Take 1 tablet (500 mg total) by mouth every 6 (six) hours as needed. 06/21/20   Georgetta Haber, NP  ascorbic acid (VITAMIN C) 500 MG tablet Take 1,000 mg by mouth daily.    [provider]  baclofen (LIORESAL) 20 MG tablet Take 1 tablet (20 mg total) by mouth 3 (three) times daily. 05/14/20   Sandre Kitty, MD  diphenhydrAMINE (BENADRYL) 25 MG tablet Take 25 mg by mouth 2 (two) times daily as needed for itching or allergies.    [provider]  famotidine (PEPCID) 20 MG tablet Take 1 tablet 3 times daily. 02/06/20   Alfonse Spruce, MD  fexofenadine (ALLEGRA) 180 MG tablet Take 1 tablet (180 mg total) by mouth in the morning, at noon, and at bedtime. Take 1 tablet 3 times daily. 03/05/20   Alfonse Spruce, MD  fluticasone [facility]) 50 MCG/ACT nasal spray Place 2 sprays into both nostrils daily. 06/04/20   Dana Allan, MD  hydrOXYzine (ATARAX/VISTARIL) 25 MG tablet Take 1 tablet (25 mg total) by mouth 3 (three) times daily as needed for itching. 01/27/20   Meccariello, Solmon Ice, DO  methylPREDNISolone (MEDROL DOSEPAK) 4 MG TBPK tablet Please take as instructed on the box 05/14/20   Constance Goltz,  Mabeline Caras, MD  montelukast (SINGULAIR) 10 MG tablet Take 1 tablet (10 mg total) by mouth at bedtime. 02/06/20   Alfonse Spruce, MD  oxycodone (OXY-IR) 5 MG capsule Take 5 mg by mouth daily.    [provider]  Oxycodone HCl 10 MG TABS Take by mouth.    [provider]  promethazine (PHENERGAN) 12.5 MG tablet Take 1 tablet (12.5 mg total) by mouth every 8 (eight) hours as needed for nausea or vomiting. 06/08/20   Sandre Kitty, MD  rosuvastatin (CRESTOR) 20 MG tablet Take 1 tablet (20 mg total) by mouth daily. 11/25/19   Anderson, Chelsey L, DO  tiZANidine (ZANAFLEX) 2 MG tablet Take 1-2 tablets (2-4 mg total) by mouth every 8 (eight) hours as needed for muscle spasms. 06/21/20   Georgetta Haber,  NP  triamcinolone (NASACORT ALLERGY 24HR) 55 MCG/ACT AERO nasal inhaler Place 1 spray into the nose daily.    [provider]  valsartan (DIOVAN) 40 MG tablet TAKE 1 TABLET(40 MG) BY MOUTH DAILY 02/12/20   Runell Gess, MD  VITAMIN E PO Take 2 tablets by mouth daily.    [provider]  Cetirizine HCl 10 MG CAPS Take 10 mg by mouth 2 (two) times daily.   10/05/19  [provider]  mupirocin nasal ointment (BACTROBAN) 2 % Apply to rash Patient not taking: Reported on 10/05/2019 11/11/18 10/05/19  Arlyn Dunning, PA-C    Family History Family History  Problem Relation Age of Onset  . Heart disease Mother   . Heart disease Father     Social History Social History   Tobacco Use  . Smoking status: Current Every Day Smoker    Packs/day: 0.50    Years: 30.00    Pack years: 15.00    Types: Cigarettes  . Smokeless tobacco: Never Used  Vaping Use  . Vaping Use: Never used  Substance Use Topics  . Alcohol use: Not Currently    Comment: stopped 2018  . Drug use: No     Allergies   Pregabalin, Sulfa antibiotics, and Gabapentin   Review of Systems Review of Systems  Constitutional: Negative for chills and fever.  HENT: Positive for congestion, postnasal drip and sore throat. Negative for ear pain.   Eyes: Negative for pain and visual disturbance.  Respiratory: Negative for cough and shortness of breath.   Cardiovascular: Negative for chest pain and palpitations.  Gastrointestinal: Negative for abdominal pain and vomiting.  Genitourinary: Negative for dysuria and hematuria.  Musculoskeletal: Negative for arthralgias and back pain.  Skin: Negative for color change and rash.  Neurological: Negative for seizures and syncope.  All other systems reviewed and are negative.    Physical Exam Triage Vital Signs ED Triage Vitals  Enc Vitals Group     BP 07/18/20 1027 125/84     Pulse Rate 07/18/20 1027 74     Resp 07/18/20 1027 18     Temp 07/18/20 1027  97.9 F (36.6 C)     Temp Source 07/18/20 1027 Oral     SpO2 07/18/20 1027 100 %     Weight --      Height --      Head Circumference --      Peak Flow --      Pain Score 07/18/20 1026 6     Pain Loc --      Pain Edu? --      Excl. in GC? --    No data  found.  Updated Vital Signs BP 125/84 (BP Location: Right Arm)   Pulse 74   Temp 97.9 F (36.6 C) (Oral)   Resp 18   SpO2 100%   Visual Acuity Right Eye Distance:   Left Eye Distance:   Bilateral Distance:    Right Eye Near:   Left Eye Near:    Bilateral Near:     Physical Exam Vitals and nursing note reviewed.  Constitutional:      Appearance: He is well-developed and well-nourished.  HENT:     Head: Normocephalic and atraumatic.     Mouth/Throat:     Pharynx: Uvula midline. Posterior oropharyngeal erythema present. No pharyngeal swelling or oropharyngeal exudate.     Tonsils: No tonsillar exudate.  Eyes:     Conjunctiva/sclera: Conjunctivae normal.  Cardiovascular:     Rate and Rhythm: Normal rate and regular rhythm.     Heart sounds: No murmur heard.   Pulmonary:     Effort: Pulmonary effort is normal. No respiratory distress.     Breath sounds: Normal breath sounds.  Abdominal:     Palpations: Abdomen is soft.     Tenderness: There is no abdominal tenderness.  Musculoskeletal:        General: No edema.     Cervical back: Neck supple.  Lymphadenopathy:     Cervical: Cervical adenopathy present.  Skin:    General: Skin is warm and dry.  Neurological:     Mental Status: He is alert.  Psychiatric:        Mood and Affect: Mood and affect normal.      UC Treatments / Results  Labs (all labs ordered are listed, but only abnormal results are displayed) Labs Reviewed  SARS CORONAVIRUS 2 (TAT 6-24 HRS)  CULTURE, GROUP A STREP [facility])  POCT RAPID STREP A, ED / UC    EKG   Radiology No results found.  Procedures Procedures (including critical care time)  Medications Ordered in  UC Medications - No data to display  Initial Impression / Assessment and Plan / UC Course  I have reviewed the triage vital signs and the nursing notes.  Pertinent labs & imaging results that were available during my care of the patient were reviewed by me and considered in my medical decision making (see chart for details).    Viral pharyngitis.  COVID test pending.  Advised to continue with flonase, allery medication, and Mucinex.  Can do salt water gargles. Will send in lidocaine mouth wash.  Return precautions discussed.  Final Clinical Impressions(s) / UC Diagnoses   Final diagnoses:  None   Discharge Instructions   None    ED Prescriptions    None     PDMP not reviewed this encounter.   Jodell Cipro, PA-C 07/18/20 1058

## 2020-07-18 NOTE — Discharge Instructions (Signed)
If COVID test is positive self isolate 5 days from symptom onset and then wear a mask around others for 5 days.  Continue with Flonase, allergy medication, Mucinex Use magic mouth was as directed.  Can take ibuprofen or tylenol as needed for pain.

## 2020-07-18 NOTE — ED Triage Notes (Signed)
Pt present sore throat, symptoms started  3 days ago. Pt states his throat is in severe pain with difficulty swallowing.

## 2020-07-19 ENCOUNTER — Other Ambulatory Visit: Payer: Self-pay | Admitting: Allergy & Immunology

## 2020-07-20 ENCOUNTER — Telehealth (HOSPITAL_COMMUNITY): Payer: Self-pay | Admitting: Emergency Medicine

## 2020-07-20 LAB — CULTURE, GROUP A STREP (THRC)

## 2020-07-20 MED ORDER — NYSTATIN 100000 UNIT/ML MT SUSP: 5.0000 mL | Freq: Four times a day (QID) | OROMUCOSAL | 0 refills | Status: DC | PRN

## 2020-07-20 NOTE — Telephone Encounter (Signed)
Pharmacy states they never received prescription, unable to take verbal, resending now

## 2020-07-21 ENCOUNTER — Telehealth (HOSPITAL_COMMUNITY): Payer: Self-pay | Admitting: Emergency Medicine

## 2020-07-21 NOTE — Telephone Encounter (Signed)
Received another call from patient stating that the pharmacy still says they do not have the prescription.  Called pharmacy, and reviewed with pharmacist, provided information they state they cannot see, and they state they will prep medication now.  Called and updated patient by leaving voicemail

## 2020-07-28 ENCOUNTER — Encounter: Payer: Self-pay | Admitting: Family Medicine

## 2020-07-28 ENCOUNTER — Telehealth: Payer: 59 | Admitting: Family Medicine

## 2020-07-28 DIAGNOSIS — J0110 Acute frontal sinusitis, unspecified: Secondary | ICD-10-CM

## 2020-07-28 MED ORDER — AMOXICILLIN-POT CLAVULANATE 875-125 MG PO TABS: 1.0000 | ORAL_TABLET | Freq: Two times a day (BID) | ORAL | 0 refills | Status: AC

## 2020-07-28 NOTE — Progress Notes (Signed)
Mr. Garrett, Chen are scheduled for a virtual visit with your provider today.    Just as we do with appointments in the office, we must obtain your consent to participate.  Your consent will be active for this visit and any virtual visit you may have with one of our providers in the next 365 days.    If you have a MyChart account, I can also send a copy of this consent to you electronically.  All virtual visits are billed to your insurance company just like a traditional visit in the office.  As this is a virtual visit, video technology does not allow for your provider to perform a traditional examination.  This may limit your provider's ability to fully assess your condition.  If your provider identifies any concerns that need to be evaluated in person or the need to arrange testing such as labs, EKG, etc, we will make arrangements to do so.    Although advances in technology are sophisticated, we cannot ensure that it will always work on either your end or our end.  If the connection with a video visit is poor, we may have to switch to a telephone visit.  With either a video or telephone visit, we are not always able to ensure that we have a secure connection.   I need to obtain your verbal consent now.   Are you willing to proceed with your visit today?   [name] has provided verbal consent on 07/28/2020 for a virtual visit (video or telephone).   Garrett Finner, NP 07/28/2020  3:34 PM   Date:  07/28/2020   ID:  [name], DOB Oct 23, 1970, MRN [id number]  Patient Location: Home Provider Location: Home Office   Participants: Patient and Provider for Visit and Wrap up  Method of visit: Video  Location of Patient: Home Location of Provider: Home Office Consent was obtain for visit over the video. Services rendered by provider: Visit was performed via video  A video enabled telemedicine application was used and I verified that I am speaking with the correct person using two  identifiers.  PCP:  Garrett Bock, DO   Chief Complaint:  Sinus symptoms   History of Present Illness:    [name] is a 50 y.o. male with history as stated below. Presents video telehealth for an acute care visit secondary to on going sinus infection symptoms. Was seen at UC last week for virus infection. Fever of 101.5  Cough and throat is sore. Denies diarrhea or GI upset Reports one episode of nausea and vomiting. Denies shortness of breath, chest pain.  Past Medical, Surgical, Social History, Allergies, and Medications have been Reviewed.  Past Medical History:  Diagnosis Date  . Angio-edema   . Anxiety   . Disseminated Lyme disease   . Headache   . Lyme disease   . [facility] spotted fever   . Urticaria     Current Meds  Medication Sig  . amoxicillin-clavulanate (AUGMENTIN) 875-125 MG tablet Take 1 tablet by mouth 2 (two) times daily for 7 days.     Allergies:   Pregabalin, Sulfa antibiotics, and Gabapentin   ROS See HPI for history of present illness.  Physical Exam Constitutional:      Appearance: Normal appearance.  HENT:     Head: Normocephalic.     Right Ear: External ear normal.     Left Ear: External ear normal.     Nose: Nose normal.  Eyes:     Conjunctiva/sclera: Conjunctivae normal.  Pulmonary:     Comments: No shortness of breath Cough present  Musculoskeletal:        General: Normal range of motion.     Cervical back: Normal range of motion.  Neurological:     Mental Status: He is alert and oriented to person, place, and time.  Psychiatric:        Mood and Affect: Mood normal.        Behavior: Behavior normal.        Thought Content: Thought content normal.        Judgment: Judgment normal.               A&P  1. Acute non-recurrent frontal sinusitis -S&S consistent with sinus infection- especially given the duration of symptoms -encouraged COVID retesting since he is not vaccinated. -OTC measures  reviewed -continue all other medications as ordered -follow up if not improved post medication completion -pt verbalized understanding  - amoxicillin-clavulanate (AUGMENTIN) 875-125 MG tablet; Take 1 tablet by mouth 2 (two) times daily for 7 days.  Dispense: 14 tablet; Refill: 0  Time:   Today, I have spent 10  minutes with the patient with telehealth technology discussing the above problems, reviewing the chart, previous notes, medications and orders.    Tests Ordered: No orders of the defined types were placed in this encounter.   Medication Changes: Meds ordered this encounter  Medications  . amoxicillin-clavulanate (AUGMENTIN) 875-125 MG tablet    Sig: Take 1 tablet by mouth 2 (two) times daily for 7 days.    Dispense:  14 tablet    Refill:  0    Order Specific Question:   Supervising Provider    Answer:   [name] [[id number]]     Disposition:  Follow up as needed  Signed, Garrett Finner, NP  07/28/2020 3:34 PM

## 2020-07-28 NOTE — Patient Instructions (Addendum)
Sinusitis, Adult Sinusitis is inflammation of your sinuses. Sinuses are hollow spaces in the bones around your face. Your sinuses are located:  Around your eyes.  In the middle of your forehead.  Behind your nose.  In your cheekbones. Mucus normally drains out of your sinuses. When your nasal tissues become inflamed or swollen, mucus can become trapped or blocked. This allows bacteria, viruses, and fungi to grow, which leads to infection. Most infections of the sinuses are caused by a virus. Sinusitis can develop quickly. It can last for up to 4 weeks (acute) or for more than 12 weeks (chronic). Sinusitis often develops after a cold. What are the causes? This condition is caused by anything that creates swelling in the sinuses or stops mucus from draining. This includes:  Allergies.  Asthma.  Infection from bacteria or viruses.  Deformities or blockages in your nose or sinuses.  Abnormal growths in the nose (nasal polyps).  Pollutants, such as chemicals or irritants in the air.  Infection from fungi (rare). What increases the risk? You are more likely to develop this condition if you:  Have a weak body defense system (immune system).  Do a lot of swimming or diving.  Overuse nasal sprays.  Smoke. What are the signs or symptoms? The main symptoms of this condition are pain and a feeling of pressure around the affected sinuses. Other symptoms include:  Stuffy nose or congestion.  Thick drainage from your nose.  Swelling and warmth over the affected sinuses.  Headache.  Upper toothache.  A cough that may get worse at night.  Extra mucus that collects in the throat or the back of the nose (postnasal drip).  Decreased sense of smell and taste.  Fatigue.  A fever.  Sore throat.  Bad breath. How is this diagnosed? This condition is diagnosed based on:  Your symptoms.  Your medical history.  A physical exam.  Tests to find out if your condition is  acute or chronic. This may include: ? Checking your nose for nasal polyps. ? Viewing your sinuses using a device that has a light (endoscope). ? Testing for allergies or bacteria. ? Imaging tests, such as an MRI or CT scan. In rare cases, a bone biopsy may be done to rule out more serious types of fungal sinus disease. How is this treated? Treatment for sinusitis depends on the cause and whether your condition is chronic or acute.  If caused by a virus, your symptoms should go away on their own within 10 days. You may be given medicines to relieve symptoms. They include: ? Medicines that shrink swollen nasal passages (topical intranasal decongestants). ? Medicines that treat allergies (antihistamines). ? A spray that eases inflammation of the nostrils (topical intranasal corticosteroids). ? Rinses that help get rid of thick mucus in your nose (nasal saline washes).  If caused by bacteria, your health care provider may recommend waiting to see if your symptoms improve. Most bacterial infections will get better without antibiotic medicine. You may be given antibiotics if you have: ? A severe infection. ? A weak immune system.  If caused by narrow nasal passages or nasal polyps, you may need to have surgery. Follow these instructions at home: Medicines  Take, use, or apply over-the-counter and prescription medicines only as told by your health care provider. These may include nasal sprays.  If you were prescribed an antibiotic medicine, take it as told by your health care provider. Do not stop taking the antibiotic even if you start   to feel better. Hydrate and humidify  Drink enough fluid to keep your urine pale yellow. Staying hydrated will help to thin your mucus.  Use a cool mist humidifier to keep the humidity level in your home above 50%.  Inhale steam for 10-15 minutes, 3-4 times a day, or as told by your health care provider. You can do this in the bathroom while a hot shower is  running.  Limit your exposure to cool or dry air.   Rest  Rest as much as possible.  Sleep with your head raised (elevated).  Make sure you get enough sleep each night. General instructions  Apply a warm, moist washcloth to your face 3-4 times a day or as told by your health care provider. This will help with discomfort.  Wash your hands often with soap and water to reduce your exposure to germs. If soap and water are not available, use hand sanitizer.  Do not smoke. Avoid being around people who are smoking (secondhand smoke).  Keep all follow-up visits as told by your health care provider. This is important.   Contact a health care provider if:  You have a fever.  Your symptoms get worse.  Your symptoms do not improve within 10 days. Get help right away if:  You have a severe headache.  You have persistent vomiting.  You have severe pain or swelling around your face or eyes.  You have vision problems.  You develop confusion.  Your neck is stiff.  You have trouble breathing. Summary  Sinusitis is soreness and inflammation of your sinuses. Sinuses are hollow spaces in the bones around your face.  This condition is caused by nasal tissues that become inflamed or swollen. The swelling traps or blocks the flow of mucus. This allows bacteria, viruses, and fungi to grow, which leads to infection.  If you were prescribed an antibiotic medicine, take it as told by your health care provider. Do not stop taking the antibiotic even if you start to feel better.  Keep all follow-up visits as told by your health care provider. This is important. This information is not intended to replace advice given to you by your health care provider. Make sure you discuss any questions you have with your health care provider. Document Revised: 10/16/2017 Document Reviewed: 10/16/2017 Elsevier Patient Education  2021 [employer]  

## 2020-08-02 ENCOUNTER — Encounter (HOSPITAL_COMMUNITY): Payer: Self-pay

## 2020-08-02 ENCOUNTER — Other Ambulatory Visit: Payer: Self-pay

## 2020-08-02 ENCOUNTER — Emergency Department (HOSPITAL_COMMUNITY)
Admission: EM | Admit: 2020-08-02 | Discharge: 2020-08-02 | Disposition: A | Discharge status: Home / Self Care | Payer: 59 | Attending: Emergency Medicine | Admitting: Emergency Medicine

## 2020-08-02 DIAGNOSIS — I10 Essential (primary) hypertension: Secondary | ICD-10-CM | POA: Insufficient documentation

## 2020-08-02 DIAGNOSIS — F17210 Nicotine dependence, cigarettes, uncomplicated: Secondary | ICD-10-CM | POA: Diagnosis not present

## 2020-08-02 DIAGNOSIS — Z79899 Other long term (current) drug therapy: Secondary | ICD-10-CM | POA: Insufficient documentation

## 2020-08-02 DIAGNOSIS — M5412 Radiculopathy, cervical region: Secondary | ICD-10-CM | POA: Insufficient documentation

## 2020-08-02 DIAGNOSIS — G8929 Other chronic pain: Secondary | ICD-10-CM

## 2020-08-02 DIAGNOSIS — M542 Cervicalgia: Secondary | ICD-10-CM | POA: Diagnosis present

## 2020-08-02 MED ORDER — METHYLPREDNISOLONE 4 MG PO TBPK: ORAL_TABLET | ORAL | 0 refills | Status: DC

## 2020-08-02 MED ORDER — DIAZEPAM 2 MG PO TABS
2.0000 mg | ORAL_TABLET | Freq: Once | ORAL | Status: AC
  Administered 2020-08-02: 2 mg via ORAL
  Filled 2020-08-02: qty 1

## 2020-08-02 MED ORDER — OXYCODONE HCL 5 MG PO TABS
10.0000 mg | ORAL_TABLET | Freq: Once | ORAL | Status: AC
  Administered 2020-08-02: 10 mg via ORAL
  Filled 2020-08-02: qty 2

## 2020-08-02 MED ORDER — KETOROLAC TROMETHAMINE 60 MG/2ML IM SOLN
30.0000 mg | Freq: Once | INTRAMUSCULAR | Status: AC
  Administered 2020-08-02: 30 mg via INTRAMUSCULAR
  Filled 2020-08-02: qty 2

## 2020-08-02 MED ORDER — DEXAMETHASONE SODIUM PHOSPHATE 10 MG/ML IJ SOLN
10.0000 mg | Freq: Once | INTRAMUSCULAR | Status: AC
  Administered 2020-08-02: 10 mg via INTRAMUSCULAR
  Filled 2020-08-02: qty 1

## 2020-08-02 NOTE — Discharge Instructions (Addendum)
You were seen in the emergency department for worsening neck pain  Your symptoms improved after muscle relaxers, steroids and anti-inflammatories  I have prescribed you methylprednisone taper, take this as directed  Resume your prescribed oxycodone, baclofen  Some find relief in gentle stretching, heat  Go to your appointment with Dr. Ethelene Hal tomorrow.  Discussed your ongoing flares of neck pain with your neurosurgeon

## 2020-08-02 NOTE — ED Provider Notes (Signed)
[name] EMERGENCY DEPARTMENT Provider Note   CSN: [id number] Arrival date & time: 08/02/20  4332     History No chief complaint on file.   [name] is a 50 y.o. male with past medical history of cervical radiculopathy under care of Dr. Shon Baton and Dr. Modesta Messing and Raechel Chute presents to the ED for evaluation of sudden but gradually worsening pain in his neck that radiates to his shoulder, and left upper extremity, around his clavicles.  Also feels like there is something stuck in his throat.  Pain is described as severe.  He is holding his left arm close to his chest states that his arm is cramping up.  Has a slight left-sided headache at the base of his skull where it meets his neck.  He has decreased sensation in his left pinky.  Reports daily, chronic similar pain to a milder degree for a long time.  Including the throat sensation.  No sore throat.  This morning he woke up and he felt the pain worse than usual and now severe.  Attributes it to being in the car for prolonged period of time yesterday.  Pain is worse with any movement, palpation.  He took his prescribed oxycodone this morning a few hours prior to arrival but this did not improve so he presented to the ED.  Reports having an epidural injection 2 weeks ago by Dr. Modesta Messing which provided pain relief for about 4 days.  Has been on steroids before which has helped.  He is on a muscle relaxer at home as well.  Denies any head or neck injury, falls.  No associated chest pain or shortness of breath.  He vomited once and he attributes this to the severity of the pain but has no abdominal pain, ongoing nausea, vomiting.  He has scheduled neck surgery at the end of April.  No fevers, chills.  Reports not being able to move his neck due to the pain.  HPI     Past Medical History:  Diagnosis Date  . Angio-edema   . Anxiety   . Disseminated Lyme disease   . Headache   . Lyme disease   . [facility] spotted  fever   . Urticaria     Patient Active Problem List   Diagnosis Date Noted  . RUQ pain 06/09/2020  . Sinusitis 06/07/2020  . Cervical radiculopathy 05/14/2020  . Acute epigastric pain 02/02/2020  . Rash of body 01/09/2020  . Essential hypertension 01/03/2020  . Atypical chest pain 01/03/2020  . Vitamin D deficiency 11/26/2019  . Fatigue 11/26/2019  . Hyperglycemia 11/26/2019  . Need for hepatitis C screening test 11/26/2019  . Pain, dental 10/26/2019  . Tobacco use 10/11/2019  . Lyme disease   . Hyperlipemia   . Urticaria 04/30/2018  . S/P lumbar fusion 11/15/2017    Past Surgical History:  Procedure Laterality Date  . ADENOIDECTOMY    . BACK SURGERY    . HIP SURGERY    . SACROILIAC JOINT FUSION Right 11/15/2017   Procedure: SACROILIAC JOINT FUSION;  Surgeon: Venita Lick, MD;  Location: [facility] OR;  Service: Orthopedics;  Laterality: Right;  120 mins  . SACROILIAC JOINT FUSION Left 07/25/2018   Procedure: SACROILIAC JOINT FUSION;  Surgeon: Venita Lick, MD;  Location: [facility] OR;  Service: Orthopedics;  Laterality: Left;  SACROILIAC JOINT FUSION  . TONSILLECTOMY     50 years old       Family History  Problem Relation Age of  Onset  . Heart disease Mother   . Heart disease Father     Social History   Tobacco Use  . Smoking status: Current Every Day Smoker    Packs/day: 0.50    Years: 30.00    Pack years: 15.00    Types: Cigarettes  . Smokeless tobacco: Never Used  Vaping Use  . Vaping Use: Never used  Substance Use Topics  . Alcohol use: Not Currently    Comment: stopped 2018  . Drug use: No    Home Medications Prior to Admission medications   Medication Sig Start Date End Date Taking? Authorizing Provider  methylPREDNISolone (MEDROL DOSEPAK) 4 MG TBPK tablet Take methylprednisolone taper as prescribed 08/02/20  Yes Sharen HeckGibbons, Umberto Pavek J, PA-C  acetaminophen (TYLENOL) 500 MG tablet Take 1 tablet (500 mg total) by mouth every 6 (six) hours as needed. 06/21/20    Georgetta HaberBurky, Natalie B, NP  amoxicillin-clavulanate (AUGMENTIN) 875-125 MG tablet Take 1 tablet by mouth 2 (two) times daily for 7 days. 07/28/20 08/04/20  Freddy FinnerMills, Hannah M, NP  ascorbic acid (VITAMIN C) 500 MG tablet Take 1,000 mg by mouth daily.    [provider]  baclofen (LIORESAL) 20 MG tablet Take 1 tablet (20 mg total) by mouth 3 (three) times daily. 05/14/20   Sandre Kittylson, Daniel K, MD  diphenhydrAMINE (BENADRYL) 25 MG tablet Take 25 mg by mouth 2 (two) times daily as needed for itching or allergies.    [provider]  famotidine (PEPCID) 20 MG tablet Take 1 tablet 3 times daily. 02/06/20   Alfonse SpruceGallagher, Joel Louis, MD  fexofenadine (ALLEGRA) 180 MG tablet Take 1 tablet (180 mg total) by mouth in the morning, at noon, and at bedtime. Take 1 tablet 3 times daily. 03/05/20   Alfonse SpruceGallagher, Joel Louis, MD  fluticasone [facility](FLONASE) 50 MCG/ACT nasal spray Place 2 sprays into both nostrils daily. 06/04/20   Dana AllanWalsh, Tanya, MD  hydrOXYzine (ATARAX/VISTARIL) 25 MG tablet Take 1 tablet (25 mg total) by mouth 3 (three) times daily as needed for itching. 01/27/20   Meccariello, Solmon IceBailey J, DO  magic mouthwash (nystatin, lidocaine, diphenhydrAMINE, alum & mag hydroxide) suspension Swish and spit 5 mLs 4 (four) times daily as needed for mouth pain. 07/20/20   Jodell CiproZanetto, Jessica, PA-C  montelukast (SINGULAIR) 10 MG tablet TAKE 1 TABLET(10 MG) BY MOUTH AT BEDTIME 07/20/20   Alfonse SpruceGallagher, Joel Louis, MD  oxycodone (OXY-IR) 5 MG capsule Take 5 mg by mouth daily.    [provider]  Oxycodone HCl 10 MG TABS Take by mouth.    [provider]  promethazine (PHENERGAN) 12.5 MG tablet Take 1 tablet (12.5 mg total) by mouth every 8 (eight) hours as needed for nausea or vomiting. 06/08/20   Sandre Kittylson, Daniel K, MD  rosuvastatin (CRESTOR) 20 MG tablet Take 1 tablet (20 mg total) by mouth daily. 11/25/19   Anderson, Chelsey L, DO  tiZANidine (ZANAFLEX) 2 MG tablet Take 1-2 tablets (2-4 mg total) by mouth every 8 (eight) hours as  needed for muscle spasms. 06/21/20   Georgetta HaberBurky, Natalie B, NP  triamcinolone (NASACORT ALLERGY 24HR) 55 MCG/ACT AERO nasal inhaler Place 1 spray into the nose daily.    [provider]  valsartan (DIOVAN) 40 MG tablet TAKE 1 TABLET(40 MG) BY MOUTH DAILY 02/12/20   Runell GessBerry, Jonathan J, MD  VITAMIN E PO Take 2 tablets by mouth daily.    [provider]  Cetirizine HCl 10 MG CAPS Take 10 mg by mouth 2 (two) times daily.  10/05/19  [provider]  mupirocin nasal ointment (BACTROBAN) 2 % Apply to rash Patient not taking: Reported on 10/05/2019 11/11/18 10/05/19  Arlyn Dunning, PA-C    Allergies    Pregabalin, Sulfa antibiotics, and Gabapentin  Review of Systems   Review of Systems  HENT:       Globus sensation  Musculoskeletal: Positive for neck pain.  Neurological: Positive for numbness (Decreased sensation) and headaches.  All other systems reviewed and are negative.   Physical Exam Updated Vital Signs BP (!) 139/98 (BP Location: Right Arm)   Pulse 100   Temp (!) 97.5 F (36.4 C) (Oral)   Resp (!) 22   SpO2 98%   Physical Exam Vitals and nursing note reviewed.  Constitutional:      General: He is not in acute distress.    Appearance: He is well-developed and well-nourished.     Comments: NAD.  HENT:     Head: Normocephalic and atraumatic.     Right Ear: External ear normal.     Left Ear: External ear normal.     Nose: Nose normal.  Eyes:     General: No scleral icterus.    Extraocular Movements: EOM normal.     Conjunctiva/sclera: Conjunctivae normal.  Neck:     Comments: Diffuse midline cervical tenderness.  Bilateral paraspinal tenderness worse on the left side worse at left occiput.  Diffuse left trapezius tenderness.  Left shoulder held in shrug position questionable spasm.  Diffuse left arm tenderness.  Patient holding left elbow and wrist held up against arm questionable contracture.  Unable to test left shoulder range of motion due to reported  severity of pain and patient compliance. Pain with minimal movement.  No focal bony tenderness of scapula, clavicle, shoulder, elbow wrist. Compartments soft. Cardiovascular:     Rate and Rhythm: Normal rate and regular rhythm.     Pulses: Intact distal pulses.          Radial pulses are 1+ on the right side and 1+ on the left side.     Heart sounds: Normal heart sounds. No murmur heard.     Comments: No chest tenderness Pulmonary:     Effort: Pulmonary effort is normal.     Breath sounds: Normal breath sounds. No wheezing.  Abdominal:     General: Bowel sounds are normal.     Palpations: Abdomen is soft.     Tenderness: There is no abdominal tenderness.     Comments: NTND  Musculoskeletal:        General: No deformity. Normal range of motion.     Cervical back: Normal range of motion and neck supple. Pain with movement and muscular tenderness present.  Skin:    General: Skin is warm and dry.     Capillary Refill: Capillary refill takes less than 2 seconds.  Neurological:     Mental Status: He is alert and oriented to person, place, and time.     Comments: Pain and patient cooperation limits neurological exam. Limited left shoulder, elbow, wrist ROM. Patient hold left shoulder shrugged, arm close to chest. Decreased left thumb active movement. Decreased sensation left pinky. Decreased left hand squeeze. Able to shrug shoulder, equal strength bilaterally   Psychiatric:        Mood and Affect: Mood and affect normal.        Behavior: Behavior normal.        Thought Content: Thought content normal.        Judgment: Judgment  normal.     ED Results / Procedures / Treatments   Labs (all labs ordered are listed, but only abnormal results are displayed) Labs Reviewed - No data to display  EKG None  Radiology No results found.  Procedures Procedures   Medications Ordered in ED Medications  diazepam (VALIUM) tablet 2 mg (2 mg Oral Given 08/02/20 0825)  oxyCODONE (Oxy  IR/ROXICODONE) immediate release tablet 10 mg (10 mg Oral Given 08/02/20 0825)  dexamethasone (DECADRON) injection 10 mg (10 mg Intramuscular Given 08/02/20 0825)  ketorolac (TORADOL) injection 30 mg (30 mg Intramuscular Given 08/02/20 3220)    ED Course  I have reviewed the triage vital signs and the nursing notes.  Pertinent labs & imaging results that were available during my care of the patient were reviewed by me and considered in my medical decision making (see chart for details).  Clinical Course as of 08/02/20 0920  Wynelle Link Aug 02, 2020  0916 Patient reevaluated in appears much more comfortable.  Reports significant improvement in pain.  States his pain is now back at his baseline level of pain that he experiences chronically.  Left arm no longer being held in truck position [CG]    Clinical Course User Index [CG] Jerrell Mylar   MDM Rules/Calculators/A&P                          50 year old male with history of cervical radiculopathy followed by Dr. Shon Baton and pain doctor Dr. Ethelene Hal presents for acute on chronic neck pain.  Reports classic radiation of pain, symptoms.  Reports globus sensation in his throat but actually states this has happened in the past with his neck pain.  No trauma.  No red flags like trauma, falls, severe headache, neuro deficits or pulse deficits distally.  No chest pain or shortness of breath.  Throat exam unremarkable.  EMR, triage nursing notes reviewed to assist with MDM and obtain more history  He has prescribed oxycodone which was refilled 2/22 for a 30-day supply  History, exam is consistent with cervical radiculopathy.  Has subtle weakness noted but patient and pain and question effort.  Compartments are soft.  DDx includes acute on chronic radiculopathy.  Considered other etiologies like vertebral dissection, infectious process, compartment syndrome or rhabdomyolysis highly unlikely.  Will provide medicines and reassess.  Will defer imaging  given chronic symptoms, no previous trauma.  Medicines ordered-Toradol, dexamethasone, oral Valium, 10 mg oxycodone  0925: Patient reevaluated and has had significant improvement in his symptoms.  Reports pain level is now at his usual level.  Given improvement appropriate for discharge.  Will prescribe methylprednisone taper.  Has appointment with Dr. Alger Simons tomorrow and scheduled surgery for next month.  Return precautions discussed.  Patient in agreement with plan of care.  Final Clinical Impression(s) / ED Diagnoses Final diagnoses:  Cervical radiculopathy  Chronic neck pain    Rx / DC Orders ED Discharge Orders         Ordered    methylPREDNISolone (MEDROL DOSEPAK) 4 MG TBPK tablet        08/02/20 0918           Liberty Handy, PA-C 08/02/20 2542    Terald Sleeper, MD 08/02/20 1754

## 2020-08-02 NOTE — ED Triage Notes (Signed)
Pt is here today due to left shoulder and arm pain. Pt reports that he had trauma over a year ago and he usually takes oxycodone for pain and states it is not working.

## 2020-08-07 ENCOUNTER — Encounter (INDEPENDENT_AMBULATORY_CARE_PROVIDER_SITE_OTHER): Payer: Self-pay

## 2020-08-10 ENCOUNTER — Telehealth: Payer: Self-pay

## 2020-08-10 NOTE — Telephone Encounter (Signed)
**Note De-Identified [name] Obfuscation** Valsartan PA started through covermymeds. Key: [id number]

## 2020-08-12 NOTE — Progress Notes (Signed)
This patient was going to be evaluated by NP [facility] but I was able to see patient for clinic visit.

## 2020-08-13 ENCOUNTER — Ambulatory Visit (INDEPENDENT_AMBULATORY_CARE_PROVIDER_SITE_OTHER): Payer: 59 | Admitting: Gastroenterology

## 2020-08-13 ENCOUNTER — Encounter: Payer: Self-pay | Admitting: Nurse Practitioner

## 2020-08-13 ENCOUNTER — Other Ambulatory Visit (INDEPENDENT_AMBULATORY_CARE_PROVIDER_SITE_OTHER): Payer: 59

## 2020-08-13 VITALS — BP 120/84 | HR 84 | Ht 72.0 in | Wt 233.8 lb

## 2020-08-13 DIAGNOSIS — Z800 Family history of malignant neoplasm of digestive organs: Secondary | ICD-10-CM | POA: Diagnosis not present

## 2020-08-13 DIAGNOSIS — R1013 Epigastric pain: Secondary | ICD-10-CM

## 2020-08-13 DIAGNOSIS — G8929 Other chronic pain: Secondary | ICD-10-CM

## 2020-08-13 DIAGNOSIS — Z8719 Personal history of other diseases of the digestive system: Secondary | ICD-10-CM | POA: Diagnosis not present

## 2020-08-13 DIAGNOSIS — R634 Abnormal weight loss: Secondary | ICD-10-CM

## 2020-08-13 DIAGNOSIS — K5900 Constipation, unspecified: Secondary | ICD-10-CM | POA: Diagnosis not present

## 2020-08-13 DIAGNOSIS — K5909 Other constipation: Secondary | ICD-10-CM | POA: Diagnosis not present

## 2020-08-13 DIAGNOSIS — Z1211 Encounter for screening for malignant neoplasm of colon: Secondary | ICD-10-CM

## 2020-08-13 LAB — HIGH SENSITIVITY CRP: CRP, High Sensitivity: 7.46 mg/L — ABNORMAL HIGH (ref 0.000–5.000)

## 2020-08-13 LAB — TSH: TSH: 0.35 u[IU]/mL (ref 0.35–4.50)

## 2020-08-13 LAB — SEDIMENTATION RATE: Sed Rate: 19 mm/hr — ABNORMAL HIGH (ref 0–15)

## 2020-08-13 LAB — LIPASE: Lipase: 24 U/L (ref 11.0–59.0)

## 2020-08-13 LAB — AMYLASE: Amylase: 47 U/L (ref 27–131)

## 2020-08-13 MED ORDER — ESOMEPRAZOLE MAGNESIUM 40 MG PO CPDR: 40.0000 mg | DELAYED_RELEASE_CAPSULE | Freq: Every day | ORAL | 2 refills | Status: DC

## 2020-08-13 MED ORDER — PLENVU 140 G PO SOLR: 1.0000 | ORAL | 0 refills | Status: DC

## 2020-08-13 NOTE — Patient Instructions (Addendum)
Stop Pepcid.   START: Nexium $RemoveBefor'40mg'WZmAiqKavZye$ - Once daily.   START: Miralax: dissolve 1 capful in at least 8 ounces of water daily for 1 week prior to endoscopies.   We have sent the following medications to your pharmacy for you to pick up at your convenience: Nexium, Plenuv   If you are age 49 or younger, your body mass index should be between 19-25. Your Body mass index is 31.71 kg/m. If this is out of the aformentioned range listed, please consider follow up with your Primary Care Provider.   Your provider has requested that you go to the basement level for lab work before leaving today. Press "B" on the elevator. The lab is located at the first door on the left as you exit the elevator.  Due to recent changes in healthcare laws, you may see the results of your imaging and laboratory studies on MyChart before your provider has had a chance to review them.  We understand that in some cases there may be results that are confusing or concerning to you. Not all laboratory results come back in the same time frame and the provider may be waiting for multiple results in order to interpret others.  Please give [place] 48 hours in order for your provider to thoroughly review all the results before contacting the office for clarification of your results.    You have been scheduled for a CT scan of the abdomen and pelvis at [place] are scheduled on3/24/22 at 9:30am. You should arrive 15 minutes prior to your appointment time for registration. Please follow the written instructions below on the day of your exam:  WARNING: IF YOU ARE ALLERGIC TO IODINE/X-RAY DYE, PLEASE NOTIFY RADIOLOGY IMMEDIATELY AT [phone]! YOU WILL BE GIVEN A 13 HOUR PREMEDICATION PREP.  1) Do not eat or drink anything after 5:30an (4 hours prior to your test)  You may take any medications as prescribed with a small amount of water, if necessary. If you take any of the following medications: METFORMIN,  GLUCOPHAGE, GLUCOVANCE, AVANDAMET, RIOMET, FORTAMET, [place] MET, JANUMET, GLUMETZA or METAGLIP, you MAY be asked to HOLD this medication 48 hours AFTER the exam.  The purpose of you drinking the oral contrast is to aid in the visualization of your intestinal tract. The contrast solution may cause some diarrhea. Depending on your individual set of symptoms, you may also receive an intravenous injection of x-ray contrast/dye. Plan on being at [facility]  for 30 minutes or longer, depending on the type of exam you are having performed.  This test typically takes 30-45 minutes to complete.  If you have any questions regarding your exam or if you need to reschedule, you may call the CT department at [phone] between the hours of 8:00 am and 5:00 pm, Monday-Friday.  ________________________________________________________________________  Thank you for choosing me and [facility].  Dr. Rush Landmark

## 2020-08-14 ENCOUNTER — Other Ambulatory Visit: Payer: 59

## 2020-08-14 DIAGNOSIS — R1013 Epigastric pain: Secondary | ICD-10-CM

## 2020-08-14 DIAGNOSIS — Z800 Family history of malignant neoplasm of digestive organs: Secondary | ICD-10-CM

## 2020-08-14 DIAGNOSIS — K5900 Constipation, unspecified: Secondary | ICD-10-CM

## 2020-08-14 DIAGNOSIS — Z8719 Personal history of other diseases of the digestive system: Secondary | ICD-10-CM

## 2020-08-14 LAB — IGA: Immunoglobulin A: 170 mg/dL (ref 47–310)

## 2020-08-14 LAB — TISSUE TRANSGLUTAMINASE, IGA: (tTG) Ab, IgA: <1 U/mL

## 2020-08-18 ENCOUNTER — Encounter: Payer: Self-pay | Admitting: Gastroenterology

## 2020-08-18 DIAGNOSIS — K5909 Other constipation: Secondary | ICD-10-CM | POA: Insufficient documentation

## 2020-08-18 DIAGNOSIS — Z1211 Encounter for screening for malignant neoplasm of colon: Secondary | ICD-10-CM | POA: Insufficient documentation

## 2020-08-18 DIAGNOSIS — Z8719 Personal history of other diseases of the digestive system: Secondary | ICD-10-CM | POA: Insufficient documentation

## 2020-08-18 DIAGNOSIS — G8929 Other chronic pain: Secondary | ICD-10-CM | POA: Insufficient documentation

## 2020-08-18 DIAGNOSIS — Z800 Family history of malignant neoplasm of digestive organs: Secondary | ICD-10-CM | POA: Insufficient documentation

## 2020-08-18 DIAGNOSIS — R634 Abnormal weight loss: Secondary | ICD-10-CM | POA: Insufficient documentation

## 2020-08-18 NOTE — Progress Notes (Signed)
[place] VISIT   Primary Care Provider Ouida Sills, Chelsey L, DO [address] [place] [address] [phone]  Referring Provider Ouida Sills, Chelsey L, DO [address],  [place] [address] [phone]  Patient Profile: [name] is a 50 y.o. male with a pmh significant for disseminated Lyme disease, anxiety, allergies, family history colon cancer (father at age 27), hypertension, hyperlipidemia, prior pancreatitis.  The patient presents to the [facility] for an evaluation and management of problem(s) noted below:  Problem List 1. Abdominal pain, chronic, epigastric   2. Unintentional weight loss   3. Chronic constipation   4. History of pancreatitis   5. Colon cancer screening   6. Family history of colon cancer     History of Present Illness This is the patient's first visit to the outpatient [place] clinic.  The patient states that for years he has been experiencing issues of midepigastric and abdominal pain.  He notes a progressive dyspepsia with worsening of abdominal pain after eating.  The pain is sharp in intensity.  He has lost over 24 pounds over the course of the last few months.  He has episodes of decreased appetite and anorexia.  The pain episodes can last for 1 to 1-1/2 hours at a time.  He has not been on PPI therapy until recently.  The patient has been experiencing episodes of belching as well as bloating.  He has atypical chest pain.  He has had nausea with episodes of vomiting.  He has constipation daily and needs to take Ex-Lax for over a year.  He is experience issues of hemorrhoids.  He believes that his constipation and hemorrhoid issues are a result of his oxycodone use.  His diagnosis of disseminated Lyme disease occurred in 2019 and he follows with the [facility] infectious disease providers in [facility].  Patient states he has had issues with anesthesia in the past in regards  to slower wake up times with regard to prior procedures.  The patient's father was diagnosed with colon cancer at the age of 28.  He has had a possible diagnosis of pancreatitis in the past.  The etiology of pancreatitis was not completely defined but was felt to potentially be a result of his disseminated Lyme disease.  He has never had an upper or lower endoscopy or seen GI in the past.  He has not had any type of colon cancer screening.  He is interested in further work-up of this.  The patient tries not to take significant nonsteroidals or BC/Goody powders.  GI Review of Systems Positive as above Negative for dysphagia, odynophagia, melena, hematochezia  Review of Systems General: Denies fevers/chills HEENT: Positive for headaches; denies oral lesions/sore throat Cardiovascular: Denies chest pain/palpitations currently Pulmonary: Denies shortness of breath Gastroenterological: See HPI Genitourinary: Denies darkened urine Hematological: Denies easy bruising/bleeding Endocrine: Denies temperature intolerance Dermatological: Denies jaundice though has had skin rashes in the past Psychological: Mood is anxious to try and get an answer to his issues   Medications Current Outpatient Medications  Medication Sig Dispense Refill  . acetaminophen (TYLENOL) 500 MG tablet Take 1 tablet (500 mg total) by mouth every 6 (six) hours as needed. 30 tablet 0  . ascorbic acid (VITAMIN C) 500 MG tablet Take 1,000 mg by mouth daily.    . baclofen (LIORESAL) 20 MG tablet Take 1 tablet (20 mg total) by mouth 3 (three) times daily. 30 each 0  . Cetirizine HCl 10  MG CAPS Take 10 mg by mouth 2 (two) times daily. 30 capsule   . diphenhydrAMINE (BENADRYL) 25 MG tablet Take 25 mg by mouth 2 (two) times daily as needed for itching or allergies.    Marland Kitchen esomeprazole (NEXIUM) 40 MG capsule Take 1 capsule (40 mg total) by mouth daily. 30 capsule 2  . fexofenadine (ALLEGRA) 180 MG tablet Take 1 tablet (180 mg total) by  mouth in the morning, at noon, and at bedtime. Take 1 tablet 3 times daily. 90 tablet 5  . fluticasone (FLONASE) 50 MCG/ACT nasal spray Place 2 sprays into both nostrils daily. 16 g 6  . magic mouthwash (nystatin, lidocaine, diphenhydrAMINE, alum & mag hydroxide) suspension Swish and spit 5 mLs 4 (four) times daily as needed for mouth pain. 180 mL 0  . montelukast (SINGULAIR) 10 MG tablet TAKE 1 TABLET(10 MG) BY MOUTH AT BEDTIME 30 tablet 0  . Oxycodone HCl 10 MG TABS Take by mouth.    Marland Kitchen PEG-KCl-NaCl-NaSulf-Na Asc-C (PLENVU) 140 g SOLR Take 1 kit by mouth as directed. Use coupon: BIN: [id number] PNC: CNRX Group: [id number] ID: [id number] 1 each 0  . promethazine (PHENERGAN) 12.5 MG tablet Take 12.5 mg by mouth every 6 (six) hours as needed for nausea or vomiting.    . rosuvastatin (CRESTOR) 20 MG tablet Take 1 tablet (20 mg total) by mouth daily. 90 tablet 3  . valsartan (DIOVAN) 40 MG tablet TAKE 1 TABLET(40 MG) BY MOUTH DAILY 90 tablet 3  . VITAMIN E PO Take 2 tablets by mouth daily.     No current facility-administered medications for this visit.    Allergies Allergies  Allergen Reactions  . Pregabalin Hives, Shortness Of Breath and Other (See Comments)    Insomnia Mood alter  . Sulfa Antibiotics Anaphylaxis  . Gabapentin Hives, Nausea And Vomiting and Nausea Only    Sweating, Fever, Hives Other reaction(s): Abdominal pain, Fever, Insomnia    Histories Past Medical History:  Diagnosis Date  . Angio-edema   . Anxiety   . Disseminated Lyme disease   . Headache   . Lyme disease   . [facility] spotted fever   . Urticaria    Past Surgical History:  Procedure Laterality Date  . ADENOIDECTOMY    . BACK SURGERY    . SACROILIAC JOINT FUSION Right 11/15/2017   Procedure: SACROILIAC JOINT FUSION;  Surgeon: Melina Schools, MD;  Location: [facility];  Service: Orthopedics;  Laterality: Right;  120 mins  . SACROILIAC JOINT FUSION Left 07/25/2018   Procedure: SACROILIAC JOINT FUSION;   Surgeon: Melina Schools, MD;  Location: [facility];  Service: Orthopedics;  Laterality: Left;  SACROILIAC JOINT FUSION  . TONSILLECTOMY     50 years old   Social History   Socioeconomic History  . Marital status: Married    Spouse name: Not on file  . Number of children: Not on file  . Years of education: Not on file  . Highest education level: Not on file  Occupational History  . Not on file  Tobacco Use  . Smoking status: Current Every Day Smoker    Packs/day: 0.50    Years: 30.00    Pack years: 15.00    Types: Cigarettes  . Smokeless tobacco: Never Used  Vaping Use  . Vaping Use: Never used  Substance and Sexual Activity  . Alcohol use: Not Currently    Comment: stopped 2018  . Drug use: No  . Sexual activity: Not on file  Other Topics Concern  .  Not on file  Social History Narrative  . Not on file   Social Determinants of Health   Financial Resource Strain: Not on file  Food Insecurity: Not on file  Transportation Needs: Not on file  Physical Activity: Not on file  Stress: Not on file  Social Connections: Not on file  Intimate Partner Violence: Not on file   Family History  Problem Relation Age of Onset  . Heart disease Mother   . Heart disease Father   . Colon polyps Father   . Colon cancer Paternal Grandfather        dx at age 94  . Esophageal cancer Neg Hx   . Inflammatory bowel disease Neg Hx   . Liver disease Neg Hx   . Pancreatic cancer Neg Hx   . Stomach cancer Neg Hx    I have reviewed his medical, social, and family history in detail and updated the electronic medical record as necessary.    PHYSICAL EXAMINATION  BP 120/84   Pulse 84   Ht 6' (1.829 m)   Wt 233 lb 12.8 oz (106.1 kg)   BMI 31.71 kg/m  Wt Readings from Last 3 Encounters:  08/13/20 233 lb 12.8 oz (106.1 kg)  06/07/20 230 lb (104.3 kg)  05/14/20 237 lb 6.4 oz (107.7 kg)  GEN: NAD, appears stated age, doesn't appear chronically ill PSYCH: Cooperative, without pressured  speech EYE: Conjunctivae pink, sclerae anicteric ENT: MMM, without oral ulcers CV: RR without R/Gs  RESP: CTAB posteriorly, without wheezing GI: NABS, soft, tenderness to palpation throughout abdomen, volitional guarding present, no rebound, hepatosplenomegaly not appreciated  MSK/EXT: No lower extremity edema SKIN: No jaundice NEURO:  Alert & Oriented x 3, left upper extremity palsy noted   REVIEW OF DATA  I reviewed the following data at the time of this encounter:  GI Procedures and Studies  No relevant studies to review  Laboratory Studies  Reviewed those in epic  Imaging Studies  January 2022 abdominal ultrasound IMPRESSION: Hyperechogenicity of the hepatic parenchyma. This is a nonspecific finding, which may be seen in the setting of hepatic steatosis or other chronic hepatic parenchymal disease. Otherwise unremarkable right upper quadrant ultrasound, as described.   ASSESSMENT  Mr. Bognar is a 50 y.o. male with a pmh significant for disseminated Lyme disease, anxiety, allergies, family history colon cancer (father at age 13), hypertension, hyperlipidemia, prior pancreatitis.  The patient is seen today for evaluation and management of:  1. Abdominal pain, chronic, epigastric   2. Unintentional weight loss   3. Chronic constipation   4. History of pancreatitis   5. Colon cancer screening   6. Family history of colon cancer    The patient is hemodynamically stable.  Clinically however he has had significant issues over the course of the last few years.  The majority of his symptoms had been attributed to his diagnosis of disseminated Lyme's disease however it is not clear that this is absolutely the case.  The patient's progressive weight loss unintentionally as well as decreased appetite and anorexia make the possibility of an underlying GI disease process still possible.  Concerning would be for potential inflammatory bowel disorders.  It is important for [place] to consider  further work-up.  I also have a concern in the setting of the significant dyspepsia as well as pain postprandially whether the patient could have an underlying vasculitis or issues in regards to vascular involvement from his known disseminated Lyme disease such that he could be having pain  as result of decreased blood flow.  CT angiography will help guide further work-up and management and I think that will help [place] as we are moving forward with getting him set up for an upper and lower endoscopy as well.  His procedures will most safely be done in the hospital-based setting.  We will try to rule out EPI as well as SIBO in the future.  The risks and benefits of endoscopic evaluation were discussed with the patient; these include but are not limited to the risk of perforation, infection, bleeding, missed lesions, lack of diagnosis, severe illness requiring hospitalization, as well as anesthesia and sedation related illnesses.  The patient is agreeable to proceed.  All patient questions were answered to the best of my ability, and the patient agrees to the aforementioned plan of action with follow-up as indicated.   PLAN  Laboratories as outlined below Fecal elastase to rule out EPI Consider SIBO evaluation in future Diagnostic endoscopy to be performed Screening colonoscopy to be performed Initiate Nexium 40 mg daily May stop Pepcid for now unless symptoms change dramatically and then may restart CT abdomen pelvis angiography to be scheduled Initiate MiraLAX once daily as needed   Orders Placed This Encounter  Procedures  . Procedural/ Surgical Case Request: COLONOSCOPY WITH PROPOFOL, ESOPHAGOGASTRODUODENOSCOPY (EGD) WITH PROPOFOL  . CT ANGIO ABDOMEN W &/OR WO CONTRAST  . CT ANGIO PELVIS W OR WO CONTRAST  . Amylase  . Lipase  . TSH  . Sedimentation rate  . CRP High sensitivity  . Tissue transglutaminase, IgA  . Pancreatic elastase, fecal  . IgA  . Ambulatory referral to Gastroenterology     New Prescriptions   ESOMEPRAZOLE (NEXIUM) 40 MG CAPSULE    Take 1 capsule (40 mg total) by mouth daily.   PEG-KCL-NACL-NASULF-NA ASC-C (PLENVU) 140 G SOLR    Take 1 kit by mouth as directed. Use coupon: BIN: [id number] PNC: CNRX Group: [id number] ID: [id number]   Modified Medications   Modified Medication Previous Medication   CETIRIZINE HCL 10 MG CAPS Cetirizine HCl 10 MG CAPS      Take 10 mg by mouth 2 (two) times daily.    Take 10 mg by mouth 2 (two) times daily.     Planned Follow Up No follow-ups on file.   Total Time in Face-to-Face and in Coordination of Care for patient including independent/personal interpretation/review of prior testing, medical history, examination, medication adjustment, communicating results with the patient directly, and documentation with the EHR is 45 minutes.   Justice Britain, MD [facility] Office # [phone]

## 2020-08-19 LAB — PANCREATIC ELASTASE, FECAL: Pancreatic Elastase-1, Stool: >500 mcg/g

## 2020-08-20 ENCOUNTER — Ambulatory Visit (HOSPITAL_COMMUNITY): Payer: 59

## 2020-09-01 ENCOUNTER — Encounter: Payer: Self-pay | Admitting: Student in an Organized Health Care Education/Training Program

## 2020-09-01 ENCOUNTER — Ambulatory Visit (INDEPENDENT_AMBULATORY_CARE_PROVIDER_SITE_OTHER): Payer: 59 | Admitting: Neurology

## 2020-09-01 ENCOUNTER — Encounter: Payer: Self-pay | Admitting: Neurology

## 2020-09-01 VITALS — BP 122/82 | HR 82 | Ht 72.0 in | Wt 233.5 lb

## 2020-09-01 DIAGNOSIS — R531 Weakness: Secondary | ICD-10-CM

## 2020-09-01 DIAGNOSIS — Z8619 Personal history of other infectious and parasitic diseases: Secondary | ICD-10-CM

## 2020-09-01 MED ORDER — LAMOTRIGINE 25 MG PO TABS: ORAL_TABLET | ORAL | 0 refills | Status: DC

## 2020-09-01 MED ORDER — LAMOTRIGINE 100 MG PO TABS: 100.0000 mg | ORAL_TABLET | Freq: Two times a day (BID) | ORAL | 11 refills | Status: DC

## 2020-09-01 NOTE — Progress Notes (Signed)
Chief Complaint  Patient presents with  . 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 Hx of Lyme Disease in 2018. He is currently under the care of Dr. Nelva Bush for neck pain. He has pending cervical surgery with Dr. Rolena Infante (at the same practice). Hx of childhood seizures. No events since around age 51.      ASSESSMENT AND PLAN  [name] is a 50 y.o. male   Posturing of left hand, Reported history of seizure  Reported recurrent left hand muscle spasm, left foot ankle forceful plantarflexion, toes flexion  Potential localized to the right hemisphere,  Proceed with MRI of the brain with without contrast  Try lamotrigine titrating to 100 mg twice a day  Reported significant cervical degenerative changes  Bring MRI cervical spine revealed  Repeat EMG nerve conduction study   DIAGNOSTIC DATA (LABS, IMAGING, TESTING) - I reviewed patient records, labs, notes, testing and imaging myself where available.  MRI cervical wo in March 2021  Multilevel cervical spondylosis, as detailed above, contributes to multilevel moderate to advanced foraminal stenosis, greatest at C5-C6 on the right and bilaterally at C6-7 and C7-T1 (left greater than right). There is mild canal stenosis at C5-C6 and C6-C7.   Degenerative changes at T1-2 result in moderate to advanced right foraminal stenosis, incompletely evaluated.  MRI brain w/wo 1.No acute intracranial abnormality.  2.Moderate paranasal sinus mucosal thickening predominantly involving the frontal sinuses and ethmoid air cells. No air-fluid levels identified.  MRI of cervical spine from [facility] on May 15, 2020: Severe left, moderately severe right C7-T1 neuroforaminal narrowing, impinging upon exiting C8 nerve roots, more on the left side, severe bilateral C6-7 neuroforaminal narrowing, impinging upon bilateral C7 nerve roots, mild canal stenosis, with minimal deformity of the  cord, severe right, mild left C5-6 neural foraminal narrowing, impinging upon the right C6 nerve roots, mild canal stenosis with minimal deformity of the cord,  HISTORICAL  [name] is a 50 year old male, seen in request by his pain management doctor Suella Broad, MD for evaluation of left hand posturing, initial evaluation was on September 01, 2020, his primary care physician is Dr. Ouida Sills, Carlynn Herald, DO  I reviewed and summarized the referring note. PMHx.  He reported heavy object fell on him, suffered right lumbar disc herniation being 1999, presented with severe instant right low back pain, radiating pain to right lower extremity, right foot drop, had 3 lumbar decompression surgery from [place] 2007 by Dr. 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  "Jessica Priest, MD - 08/25/2017 infectious disease from [facility] of this note might be different from the original. I saw and evaluated the patient and reviewed the resident's note. I agree with the resident's findings and plan. I saw Mr. Warth during his  hospitalization last August. At that time, we had extensive discussions that I was clinically suspicious that he had Lyme Disease, but his serology was negative and I could  not be sure whether that was due to early treatment or an alternative diagnosis. We elected to aggressively treat him for possible Lyme Disease with 4 weeks of ceftriaxone (after he had already received doxycycline) to make sure that "active" Lyme Disease would be removed from the differential. I am disappointed that our strategy was not understood and there has been continued discussion (by patients and providers) of persistent/recurrent Lyme Disease. 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 While it is possible that some of his arthralgias are due to "post-Lyme" syndrome, I think they are more likely associated with his chronic, post-trauma joint problems. 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."  Reviewed recent laboratory evaluation March 2022, normal amylase, lipase, TSH, tissue transglutaminase, mild elevated ESR of 19, high-sensitivity C-reactive protein was elevated to 7.5, He fell while walking through a door in December 2019, hit himself on the door frame on the left side of his shoulder, fell to the concrete floor, had transient loss of consciousness, since then, he began to experience neck pain, radiating pain to left shoulder blades, left lateral arm, progressively getting worse, he began to notice intermittent left hand posturing since November 2021, gradually getting worse, now has painful muscle spasm posturing of left hand, open times also including forceful left ankle plantarflexion, toe flexion, drooling from left multiple nose, mild confusion, lasting for 10 to 15 minutes, multiple recurrent episode in a day, reported mild improvement after baclofen, but still have 2-3 spells a day,  He also reported that he has a history of seizure from 49months to 20 years old, 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 Dietrich Pates, per patient, MRI of cervical spine showed moderate degenerative changes from C5, 6, 7 T1 levels, is  planning up to have cervical fusion, but need to rule out other etiology of his left hand posturing before proceed with decompression surgery  EMG nerve conduction study by Dr. Nelva Bush on August 03, 2020 showed normal left median, ulnar, sensory and motor response,   REVIEW OF SYSTEMS: Full 14 system review of systems performed and notable only for as above All other review of systems were negative.  PHYSICAL EXAM   Vitals:   09/01/20 1521  BP: 122/82  Pulse: 82  Weight: 233 lb 8 oz (105.9 kg)  Height: 6' (1.829 m)   Not recorded     Body mass index is 31.67 kg/m.  PHYSICAL EXAMNIATION:  Gen: NAD, conversant, well nourised, well groomed                     Cardiovascular: Regular rate rhythm, no peripheral edema, warm, nontender. Eyes: Conjunctivae clear without exudates or hemorrhage Neck: Supple, no carotid bruits. Pulmonary: Clear to auscultation bilaterally   NEUROLOGICAL EXAM:  MENTAL STATUS: Speech:    Speech is normal; fluent and spontaneous with normal comprehension.  Cognition:     Orientation to time, place and person     Normal recent and remote memory     Normal Attention span and concentration     Normal Language, naming, repeating,spontaneous speech     Fund of knowledge   CRANIAL NERVES: CN II: Visual fields are full to confrontation. Pupils are round equal and briskly reactive to light. CN Chen, IV, VI: extraocular movement are normal. No ptosis. CN  V: Facial sensation is intact to light touch CN VII: Mild left lower face asymmetry, but quickly corrected with facial movement CN VIII: Hearing is normal to causal conversation. CN IX, X: Phonation is normal. CN XI: Head turning and shoulder shrug are intact  MOTOR: Fixation of left upper extremity upon rapid rotating movement, tendency for finger flexion, thumb in position, no significant weakness, wearing right AFO, moderate right ankle dorsiflexion weakness  REFLEXES: Reflexes are 1+ and symmetric at  the biceps, triceps, knees, and absent at ankles. Plantar responses are flexor.  SENSORY: Intact to light touch, pinprick and vibratory sensation are intact in fingers and toes.  COORDINATION: There is no trunk or limb dysmetria noted.  GAIT/STANCE: Need push-up to get up from seated position, cautious mildly unsteady  ALLERGIES: Allergies  Allergen Reactions  . Pregabalin Hives, Shortness Of Breath and Other (See Comments)    **ATTEMPTING TO TAKE THIS MEDICATION AGAIN** Insomnia Mood alter  . Sulfa Antibiotics Anaphylaxis  . Gabapentin Hives, Nausea And Vomiting and Nausea Only    Sweating, Fever, Hives Other reaction(s): Abdominal pain, Fever, Insomnia    HOME MEDICATIONS: Current Outpatient Medications  Medication Sig Dispense Refill  . acetaminophen (TYLENOL) 500 MG tablet Take 1 tablet (500 mg total) by mouth every 6 (six) hours as needed. (Patient taking differently: Take 500 mg by mouth every 6 (six) hours as needed (pain).) 30 tablet 0  . Ascorbic Acid (VITAMIN C) 1000 MG tablet Take 1,000 mg by mouth in the morning.    Marland Kitchen ascorbic acid (VITAMIN C) 500 MG tablet Take 1,000 mg by mouth daily.    . baclofen (LIORESAL) 20 MG tablet Take 1 tablet (20 mg total) by mouth 3 (three) times daily. 30 each 0  . Cetirizine HCl 10 MG CAPS Take 10 mg by mouth daily as needed (allergies). 30 capsule   . Cholecalciferol (D3-1000) 25 MCG (1000 UT) tablet Take 1,000 Units by mouth in the morning.    . diclofenac Sodium (VOLTAREN) 1 % GEL Apply 2-4 g topically 4 (four) times daily as needed (pain).    Marland Kitchen diphenhydrAMINE (BENADRYL) 25 mg capsule Take 25 mg by mouth every 6 (six) hours as needed for allergies.    Marland Kitchen esomeprazole (NEXIUM) 40 MG capsule Take 1 capsule (40 mg total) by mouth daily. 30 capsule 2  . famotidine (PEPCID) 20 MG tablet Take 20 mg by mouth in the morning, at noon, and at bedtime.    . fexofenadine (ALLEGRA) 180 MG tablet Take 1 tablet (180 mg total) by mouth in the  morning, at noon, and at bedtime. Take 1 tablet 3 times daily. (Patient taking differently: Take 180 mg by mouth in the morning, at noon, and at bedtime.) 90 tablet 5  . fluticasone (FLONASE) 50 MCG/ACT nasal spray Place 2 sprays into both nostrils daily. (Patient taking differently: Place 1-2 sprays into both nostrils in the morning, at noon, and at bedtime.) 16 g 6  . gabapentin (NEURONTIN) 300 MG capsule Take 300 mg by mouth at bedtime.    . magic mouthwash (nystatin, lidocaine, diphenhydrAMINE, alum & mag hydroxide) suspension Swish and spit 5 mLs 4 (four) times daily as needed for mouth pain. (Patient taking differently: Swish and spit 5 mLs 4 (four) times daily as needed (throat/mouth pain.).) 180 mL 0  . Oxycodone HCl 10 MG TABS Take 10 mg by mouth 3 (three) times daily as needed (pain).    Marland Kitchen PEG-KCl-NaCl-NaSulf-Na Asc-C (PLENVU) 140 g SOLR Take 1 kit by mouth  as directed. Use coupon: BIN: [id number] PNC: CNRX Group: [id number] ID: [id number] 1 each 0  . promethazine (PHENERGAN) 12.5 MG tablet Take 12.5 mg by mouth every 6 (six) hours as needed for nausea or vomiting.    . rosuvastatin (CRESTOR) 20 MG tablet Take 1 tablet (20 mg total) by mouth daily. (Patient taking differently: Take 20 mg by mouth in the morning.) 90 tablet 3  . valsartan (DIOVAN) 40 MG tablet TAKE 1 TABLET(40 MG) BY MOUTH DAILY (Patient taking differently: Take 40 mg by mouth in the morning.) 90 tablet 3   No current facility-administered medications for this visit.    PAST MEDICAL HISTORY: Past Medical History:  Diagnosis Date  . Angio-edema   . Anxiety   . Disseminated Lyme disease   . Dystonia   . Headache   . Lyme disease   . Neck pain   . [facility] spotted fever   . Urticaria     PAST SURGICAL HISTORY: Past Surgical History:  Procedure Laterality Date  . ADENOIDECTOMY    . BACK SURGERY    . SACROILIAC JOINT FUSION Right 11/15/2017   Procedure: SACROILIAC JOINT FUSION;  Surgeon: Melina Schools, MD;   Location: [place];  Service: Orthopedics;  Laterality: Right;  120 mins  . SACROILIAC JOINT FUSION Left 07/25/2018   Procedure: SACROILIAC JOINT FUSION;  Surgeon: Melina Schools, MD;  Location: [place];  Service: Orthopedics;  Laterality: Left;  SACROILIAC JOINT FUSION  . TONSILLECTOMY     50 years old    FAMILY HISTORY: Family History  Problem Relation Age of Onset  . Heart disease Mother   . Heart disease Father   . Colon polyps Father   . Colon cancer Paternal Grandfather        dx at age 27  . Esophageal cancer Neg Hx   . Inflammatory bowel disease Neg Hx   . Liver disease Neg Hx   . Pancreatic cancer Neg Hx   . Stomach cancer Neg Hx     SOCIAL HISTORY: Social History   Socioeconomic History  . Marital status: Married    Spouse name: Not on file  . Number of children: 2  . Years of education: college  . Highest education level: Bachelor's degree (e.g., BA, AB, BS)  Occupational History  . Occupation: currently seeking disability  Tobacco Use  . Smoking status: Current Every Day Smoker    Packs/day: 0.50    Years: 30.00    Pack years: 15.00    Types: Cigarettes  . Smokeless tobacco: Never Used  Vaping Use  . Vaping Use: Never used  Substance and Sexual Activity  . Alcohol use: Not Currently    Comment: stopped 2018  . Drug use: No  . Sexual activity: Not on file  Other Topics Concern  . Not on file  Social History Narrative   Lives at home with his wife.   Right-handed.   Caffeine use: two cans of [facility].   Social Determinants of Health   Financial Resource Strain: Not on file  Food Insecurity: Not on file  Transportation Needs: Not on file  Physical Activity: Not on file  Stress: Not on file  Social Connections: Not on file  Intimate Partner Violence: Not on file    Total time spent reviewing the chart, obtaining history, examined patient, ordering tests, documentation, consultations and family, care coordination was 92 minutes     Marcial Pacas,  M.D. Ph.D.  [facility] [address]  [place], [place] [address] Ph: [phone] Fax: [phone]  CC:  Suella Broad, MD [address] [place],  [place] [address]  Richarda Osmond, DO w

## 2020-09-02 ENCOUNTER — Telehealth: Payer: Self-pay | Admitting: Neurology

## 2020-09-02 NOTE — Telephone Encounter (Signed)
[facility]: [id number] (exp. 09/02/20 to 10/01/20) order sent to GI. They will reach out to the patient to schedule.

## 2020-09-03 ENCOUNTER — Ambulatory Visit (INDEPENDENT_AMBULATORY_CARE_PROVIDER_SITE_OTHER): Payer: 59 | Admitting: Family Medicine

## 2020-09-03 ENCOUNTER — Other Ambulatory Visit: Payer: Self-pay

## 2020-09-03 ENCOUNTER — Encounter: Payer: Self-pay | Admitting: Family Medicine

## 2020-09-03 DIAGNOSIS — I10 Essential (primary) hypertension: Secondary | ICD-10-CM

## 2020-09-03 NOTE — Progress Notes (Signed)
    SUBJECTIVE:   CHIEF COMPLAINT / HPI: BP check  Patient comes in with concerns regarding recent blood pressures he's checked at home.  Has graph on phone with number's with most days in 110's and 120's, but yesterday showed fluxuations from XBP 120's-150's and DBP's 70's-100's.  Indicates he feels this is due to many stressors in life, involving custody of his son.  Patient had PHQ of 14 with negative question 9.  Indicates has good support system in wife and is not interested in therapy at this time.  PERTINENT  PMH / XIH:[id number] Hypertension  OBJECTIVE:   BP 120/72   Pulse 87   Wt 232 lb 4.8 oz (105.4 kg)   SpO2 99%   BMI 31.51 kg/m    Physical Exam Constitutional:      General: He is not in acute distress.    Appearance: Normal appearance. He is not ill-appearing.  HENT:     Head: Normocephalic and atraumatic.     Mouth/Throat:     Mouth: Mucous membranes are moist.  Cardiovascular:     Rate and Rhythm: Normal rate and regular rhythm.  Pulmonary:     Effort: Pulmonary effort is normal.     Breath sounds: Normal breath sounds.  Neurological:     Mental Status: He is alert.     ASSESSMENT/PLAN:   Essential hypertension Patient BP well controlled today at 120/72.  Reassured patient this is a good number and it's common to have fluctuations throughout the day.   - Indicated to check BP when sitting down for >5 minutes and relaxed, given good control currently does not need to be checking multiple times a day - Continue Valsartan 40 mg daily and heart healthy diet - follow up with PCP as needed     Jovita Kussmaul, MD [facility]

## 2020-09-03 NOTE — Assessment & Plan Note (Signed)
Patient BP well controlled today at 120/72.  Reassured patient this is a good number and it's common to have fluctuations throughout the day.   - Indicated to check BP when sitting down for >5 minutes and relaxed, given good control currently does not need to be checking multiple times a day - Continue Valsartan 40 mg daily and heart healthy diet - follow up with PCP as needed

## 2020-09-03 NOTE — Patient Instructions (Signed)
It was good to see you today.  Thank you for coming in.  Your blood pressure looks very good today. At 120/72    As we discussed it is best to check your blood pressure when you've been sitting down for several minutes and relaxed and that in times of stress or movement it is common to fluctuate.  Please reach out if you ever feel overwhelmed with stressors in your life, we would be happy to help.  Please also follow-up with [place] as needed.  Be Well, Dr Pecola Leisure

## 2020-09-04 ENCOUNTER — Ambulatory Visit (HOSPITAL_COMMUNITY): Admission: RE | Admit: 2020-09-04 | Payer: 59 | Source: Ambulatory Visit

## 2020-09-04 ENCOUNTER — Ambulatory Visit (HOSPITAL_COMMUNITY): Payer: 59

## 2020-09-04 ENCOUNTER — Encounter (HOSPITAL_COMMUNITY): Payer: Self-pay

## 2020-09-05 ENCOUNTER — Other Ambulatory Visit: Payer: Self-pay

## 2020-09-05 ENCOUNTER — Ambulatory Visit
Admission: RE | Admit: 2020-09-05 | Discharge: 2020-09-05 | Disposition: A | Discharge status: Home / Self Care | Payer: 59 | Source: Ambulatory Visit | Attending: Neurology | Admitting: Neurology

## 2020-09-05 DIAGNOSIS — R531 Weakness: Secondary | ICD-10-CM

## 2020-09-05 MED ORDER — GADOBENATE DIMEGLUMINE 529 MG/ML IV SOLN
20.0000 mL | Freq: Once | INTRAVENOUS | Status: AC | PRN
  Administered 2020-09-05: 20 mL via INTRAVENOUS

## 2020-09-07 ENCOUNTER — Telehealth: Payer: Self-pay | Admitting: Gastroenterology

## 2020-09-07 ENCOUNTER — Telehealth: Payer: Self-pay | Admitting: Neurology

## 2020-09-07 NOTE — Telephone Encounter (Signed)
Returned pt call. Procedure has been cancelled due to cost. Pt would like to wait until he gets new insurance. 3 month recall placed. Pt advised to contact office sooner , if he receives new insurance before then. Case cancelled with [facility] in scheduling.

## 2020-09-07 NOTE — Telephone Encounter (Signed)
  IMPRESSION: This MRI of the brain with and without contrast shows the following: 1.   In the cerebral hemispheres, there are some scattered T2/FLAIR hyperintense foci in the subcortical and deep white matter.  None of these appear to be acute.  They do not enhance.  They are most consistent with mild chronic microvascular ischemic change. 2.   Chronic ethmoid and frontal sinusitis 3.   No acute findings.  Normal enhancement pattern.  Please call patient MRI of the brain showed mild small vessel disease, no acute abnormalities, none of the findings would explain his symptoms.  I will review films at his next follow-up in May 4th 2022

## 2020-09-07 NOTE — Telephone Encounter (Signed)
Left second message for a return call. 

## 2020-09-07 NOTE — Telephone Encounter (Signed)
Patient called and said he received a benefit letter in the mail and can not afford any medical cost at this time said he will wait until he gets his medicaid and cancel for now.

## 2020-09-07 NOTE — Telephone Encounter (Signed)
Left message for a return call

## 2020-09-07 NOTE — Telephone Encounter (Signed)
Patient left a voicemail this morning asking for a return call from [place].

## 2020-09-07 NOTE — Progress Notes (Signed)
Attempted to obtain medical history via telephone, unable to reach at this time. I left a voicemail to return pre surgical testing department's phone call.  

## 2020-09-07 NOTE — Telephone Encounter (Signed)
CMA scheduled please send accordingly. Thank you

## 2020-09-07 NOTE — Telephone Encounter (Signed)
I spoke to the patient and provided him the MRI brain results. He will keep his pending appts for EEG and EMG/NCV.

## 2020-09-08 NOTE — Telephone Encounter (Signed)
I am sorry to hear this.  If the issue is cough then hopefully he can get insurance sooner.  If the issue is the diagnostic endoscopy rather than just a screening colonoscopy, hopefully he can at least get the screening colonoscopy but he certainly has already spoken with his insurance at this point.  Agree with 3 months recall so that hopefully by then he has new insurance. FYI Dr. Dareen Piano. GM

## 2020-09-10 ENCOUNTER — Other Ambulatory Visit (HOSPITAL_COMMUNITY): Payer: 59

## 2020-09-14 ENCOUNTER — Encounter (HOSPITAL_COMMUNITY): Admission: RE | Payer: Self-pay | Source: Home / Self Care

## 2020-09-14 ENCOUNTER — Ambulatory Visit (HOSPITAL_COMMUNITY): Admission: RE | Admit: 2020-09-14 | Payer: 59 | Source: Home / Self Care | Admitting: Gastroenterology

## 2020-09-14 SURGERY — COLONOSCOPY WITH PROPOFOL
Anesthesia: Monitor Anesthesia Care

## 2020-09-28 ENCOUNTER — Ambulatory Visit (INDEPENDENT_AMBULATORY_CARE_PROVIDER_SITE_OTHER): Payer: 59 | Admitting: Neurology

## 2020-09-28 ENCOUNTER — Other Ambulatory Visit: Payer: Self-pay

## 2020-09-28 DIAGNOSIS — R252 Cramp and spasm: Secondary | ICD-10-CM | POA: Diagnosis not present

## 2020-09-28 DIAGNOSIS — R531 Weakness: Secondary | ICD-10-CM

## 2020-09-30 ENCOUNTER — Ambulatory Visit (INDEPENDENT_AMBULATORY_CARE_PROVIDER_SITE_OTHER): Payer: 59 | Admitting: Neurology

## 2020-09-30 ENCOUNTER — Other Ambulatory Visit: Payer: Self-pay

## 2020-09-30 ENCOUNTER — Encounter (INDEPENDENT_AMBULATORY_CARE_PROVIDER_SITE_OTHER): Payer: 59 | Admitting: Neurology

## 2020-09-30 DIAGNOSIS — M542 Cervicalgia: Secondary | ICD-10-CM

## 2020-09-30 DIAGNOSIS — R531 Weakness: Secondary | ICD-10-CM

## 2020-09-30 DIAGNOSIS — R269 Unspecified abnormalities of gait and mobility: Secondary | ICD-10-CM | POA: Diagnosis not present

## 2020-09-30 DIAGNOSIS — Z0289 Encounter for other administrative examinations: Secondary | ICD-10-CM

## 2020-09-30 MED ORDER — GABAPENTIN 300 MG PO CAPS: 300.0000 mg | ORAL_CAPSULE | Freq: Three times a day (TID) | ORAL | 11 refills | Status: DC

## 2020-09-30 NOTE — Progress Notes (Signed)
No chief complaint on file.     ASSESSMENT AND PLAN  [name] is a 50 y.o. male   Posturing of left hand, Reported history of seizure  Electrodiagnostic study on Oct 01, 2019 was normal, in specific, there is no evidence of bilateral lumbosacral radiculopathy, left cervical radiculopathy,  During examination, especially during distraction, he was noted to have normal left hand movement, no longer have left hand or arm posturing, he has mild atrophy of right distal leg, wearing right AFO, frequent right calf muscle cramping, but there was no significant abnormality on EMG examination other than frequent spontaneous activity of right calf muscles that correlate with visible muscle cramping, he has variable effort on examination, felt to there was no significant  right ankle dorsi flexion weakness, Right lower extremity muscle cramping  Will try higher dose of gabapentin 300 mg 3 times daily  DIAGNOSTIC DATA (LABS, IMAGING, TESTING) - I reviewed patient records, labs, notes, testing and imaging myself where available.  MRI cervical wo in March 2021  Multilevel cervical spondylosis, as detailed above, contributes to multilevel moderate to advanced foraminal stenosis, greatest at C5-C6 on the right and bilaterally at C6-7 and C7-T1 (left greater than right). There is mild canal stenosis at C5-C6 and C6-C7.   Degenerative changes at T1-2 result in moderate to advanced right foraminal stenosis, incompletely evaluated.  MRI brain w/wo 1.No acute intracranial abnormality.  2.Moderate paranasal sinus mucosal thickening predominantly involving the frontal sinuses and ethmoid air cells. No air-fluid levels identified.  MRI of cervical spine from [name] on May 15, 2020: Severe left, moderately severe right C7-T1 neuroforaminal narrowing, impinging upon exiting C8 nerve roots, more on the left side, severe bilateral C6-7 neuroforaminal narrowing, impinging upon bilateral C7  nerve roots, mild canal stenosis, with minimal deformity of the cord, severe right, mild left C5-6 neural foraminal narrowing, impinging upon the right C6 nerve roots, mild canal stenosis with minimal deformity of the cord,  HISTORICAL  [name] is a 50 year old male, seen in request by his pain management doctor Suella Broad, MD for evaluation of left hand posturing, initial evaluation was on September 01, 2020, his primary care physician is Dr. Ouida Sills, Carlynn Herald, DO  I reviewed and summarized the referring note. PMHx.  He reported heavy object fell on him, suffered right lumbar disc herniation being 1999, presented with severe instant right low back pain, radiating pain to right lower extremity, right foot drop, had 3 lumbar decompression surgery from [place]- 2007 by Dr. 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  "Jessica Priest, MD - 08/25/2017 infectious disease from [facility] of this note might be different from the original. I saw and evaluated the patient and reviewed the resident's note. I agree with the resident's findings and plan. I saw Mr. Frontera during his hospitalization  last August. At that time, we had extensive discussions that I was clinically suspicious that  he had Lyme Disease, but his serology was negative and I could not be sure whether that was due to early treatment or an alternative diagnosis. We elected to aggressively treat him for possible Lyme Disease with 4 weeks of ceftriaxone (after he had already received doxycycline) to make sure that "active" Lyme Disease would be removed from the differential. I am disappointed that our strategy was not understood and there has been continued discussion (by patients and providers) of persistent/recurrent Lyme Disease. 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 While it is possible that some of his arthralgias are due to "post-Lyme" syndrome, I think they are more likely associated with his chronic, post-trauma joint problems. 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."  Reviewed recent laboratory evaluation March 2022, normal amylase, lipase, TSH, tissue transglutaminase, mild elevated ESR of 19, high-sensitivity C-reactive protein was elevated to 7.5, He fell while walking through a door in December 2019, hit himself on the door frame on the left side of his shoulder, fell to the concrete floor, had transient loss of consciousness, since then, he began to experience neck pain, radiating pain to left shoulder blades, left lateral arm, progressively getting worse, he began to notice intermittent left hand posturing since November 2021, gradually getting worse, now has painful muscle spasm posturing of left hand, open times also including forceful left ankle plantarflexion, toe flexion, drooling from left multiple nose, mild confusion, lasting for 10 to 15 minutes, multiple recurrent episode in a day, reported mild improvement after baclofen, but still have 2-3 spells a day,  He also reported that he has a history of seizure from 59months to 35 years old, 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 Dietrich Pates, per patient, MRI of cervical spine  showed moderate degenerative changes from C5, 6, 7 T1 levels, is planning up to have cervical fusion, but need to rule out other etiology of his left hand posturing before proceed with decompression surgery  EMG nerve conduction study by Dr. Nelva Bush on August 03, 2020 showed normal left median, ulnar, sensory and motor response,   Update Sep 30, 2020 Patient return for EMG nerve conduction study today, which was essentially normal, there is no evidence of large fiber peripheral neuropathy, bilateral lumbosacral radiculopathy or left cervical radiculopathy.  REVIEW OF SYSTEMS: Full 14 system review of systems performed and notable only for as above All other review of systems were negative.  PHYSICAL EXAM   There were no vitals filed for this visit. Not recorded     There is no height or weight on file to calculate BMI.  PHYSICAL EXAMNIATION:  Gen: NAD, conversant, well nourised, well groomed         NEUROLOGICAL EXAM:  MENTAL STATUS: Speech/cognition: Awake, alert, oriented to history taking and casual conversation  CRANIAL NERVES: CN II: Visual fields are full to confrontation. Pupils are round equal and briskly reactive to light. CN Chen, IV, VI: extraocular movement are normal. No ptosis. CN V: Facial sensation is intact to light touch CN VII: Mild left lower face asymmetry, but quickly corrected with facial movement CN VIII: Hearing is normal to causal conversation. CN IX, X: Phonation is normal. CN XI: Head turning and shoulder shrug are intact  MOTOR: He has mild atrophy of right distal leg muscle, frequent right calf muscle cramping, with encouragement, I do not see any significant right lower extremity  proximal and distal muscle weakness, he has variable effort on examination, there was no significant right ankle dorsiflexion weakness, with distraction, there was no longer left hand posturing  REFLEXES: Reflexes are 1+ and symmetric at the biceps, triceps, knees, and trace  at ankles. Plantar responses are flexor.  SENSORY: Intact to light touch, pinprick and vibratory sensation are intact in fingers and toes.  COORDINATION: There is no trunk or limb dysmetria noted.  GAIT/STANCE: Need push-up to get up from seated position,dragging right foot across the floor,  ALLERGIES: Allergies  Allergen Reactions  . Pregabalin Hives, Shortness Of Breath and Other (See Comments)    **ATTEMPTING TO TAKE THIS MEDICATION AGAIN** Insomnia Mood alter  . Sulfa Antibiotics Anaphylaxis  . Gabapentin Hives, Nausea And Vomiting and Nausea Only    Sweating, Fever, Hives Other reaction(s): Abdominal pain, Fever, Insomnia    HOME MEDICATIONS: Current Outpatient Medications  Medication Sig Dispense Refill  . acetaminophen (TYLENOL) 500 MG tablet Take 1 tablet (500 mg total) by mouth every 6 (six) hours as needed. (Patient taking differently: Take 500 mg by mouth every 6 (six) hours as needed (pain).) 30 tablet 0  . Ascorbic Acid (VITAMIN C) 1000 MG tablet Take 1,000 mg by mouth in the morning.    Marland Kitchen ascorbic acid (VITAMIN C) 500 MG tablet Take 1,000 mg by mouth daily.    . baclofen (LIORESAL) 20 MG tablet Take 1 tablet (20 mg total) by mouth 3 (three) times daily. 30 each 0  . Cetirizine HCl 10 MG CAPS Take 10 mg by mouth daily as needed (allergies). 30 capsule   . Cholecalciferol (D3-1000) 25 MCG (1000 UT) tablet Take 1,000 Units by mouth in the morning.    . diclofenac Sodium (VOLTAREN) 1 % GEL Apply 2-4 g topically 4 (four) times daily as needed (pain).    Marland Kitchen diphenhydrAMINE (BENADRYL) 25 mg capsule Take 25 mg by mouth every 6 (six) hours as needed for allergies.    Marland Kitchen esomeprazole (NEXIUM) 40 MG capsule Take 1 capsule (40 mg total) by mouth daily. 30 capsule 2  . famotidine (PEPCID) 20 MG tablet Take 20 mg by mouth in the morning, at noon, and at bedtime.    . fexofenadine (ALLEGRA) 180 MG tablet Take 1 tablet (180 mg total) by mouth in the morning, at noon, and at bedtime.  Take 1 tablet 3 times daily. (Patient taking differently: Take 180 mg by mouth in the morning, at noon, and at bedtime.) 90 tablet 5  . fluticasone (FLONASE) 50 MCG/ACT nasal spray Place 2 sprays into both nostrils daily. (Patient taking differently: Place 1-2 sprays into both nostrils in the morning, at noon, and at bedtime.) 16 g 6  . gabapentin (NEURONTIN) 300 MG capsule Take 300 mg by mouth at bedtime.    . lamoTRIgine (LAMICTAL) 100 MG tablet Take 1 tablet (100 mg total) by mouth 2 (two) times daily. 60 tablet 11  . lamoTRIgine (LAMICTAL) 25 MG tablet 1 tab bid x one week 2 tab bid x 2nd week 3 tab bid x 3rd week 84 tablet 0  . magic mouthwash (nystatin, lidocaine, diphenhydrAMINE, alum & mag hydroxide) suspension Swish and spit 5 mLs 4 (four) times daily as needed for mouth pain. (Patient taking differently: Swish and spit 5 mLs 4 (four) times daily as needed (throat/mouth pain.).) 180 mL 0  . Oxycodone HCl 10 MG TABS Take 10 mg by mouth 3 (three) times daily as needed (pain).    Marland Kitchen PEG-KCl-NaCl-NaSulf-Na Asc-C (PLENVU) 140  g SOLR Take 1 kit by mouth as directed. Use coupon: BIN: 169678 PNC: CNRX Group: [id number] ID: [id number] 1 each 0  . promethazine (PHENERGAN) 12.5 MG tablet Take 12.5 mg by mouth every 6 (six) hours as needed for nausea or vomiting.    . rosuvastatin (CRESTOR) 20 MG tablet Take 1 tablet (20 mg total) by mouth daily. (Patient taking differently: Take 20 mg by mouth in the morning.) 90 tablet 3  . valsartan (DIOVAN) 40 MG tablet TAKE 1 TABLET(40 MG) BY MOUTH DAILY (Patient taking differently: Take 40 mg by mouth in the morning.) 90 tablet 3   No current facility-administered medications for this visit.    PAST MEDICAL HISTORY: Past Medical History:  Diagnosis Date  . Angio-edema   . Anxiety   . Disseminated Lyme disease   . Dystonia   . Headache   . Lyme disease   . Neck pain   . [facility] spotted fever   . Urticaria     PAST SURGICAL HISTORY: Past  Surgical History:  Procedure Laterality Date  . ADENOIDECTOMY    . BACK SURGERY    . SACROILIAC JOINT FUSION Right 11/15/2017   Procedure: SACROILIAC JOINT FUSION;  Surgeon: Melina Schools, MD;  Location: [facility];  Service: Orthopedics;  Laterality: Right;  120 mins  . SACROILIAC JOINT FUSION Left 07/25/2018   Procedure: SACROILIAC JOINT FUSION;  Surgeon: Melina Schools, MD;  Location: [facility];  Service: Orthopedics;  Laterality: Left;  SACROILIAC JOINT FUSION  . TONSILLECTOMY     50 years old    FAMILY HISTORY: Family History  Problem Relation Age of Onset  . Heart disease Mother   . Heart disease Father   . Colon polyps Father   . Colon cancer Paternal Grandfather        dx at age 36  . Esophageal cancer Neg Hx   . Inflammatory bowel disease Neg Hx   . Liver disease Neg Hx   . Pancreatic cancer Neg Hx   . Stomach cancer Neg Hx     SOCIAL HISTORY: Social History   Socioeconomic History  . Marital status: Married    Spouse name: Not on file  . Number of children: 2  . Years of education: college  . Highest education level: Bachelor's degree (e.g., BA, AB, BS)  Occupational History  . Occupation: currently seeking disability  Tobacco Use  . Smoking status: Current Every Day Smoker    Packs/day: 0.50    Years: 30.00    Pack years: 15.00    Types: Cigarettes  . Smokeless tobacco: Never Used  Vaping Use  . Vaping Use: Never used  Substance and Sexual Activity  . Alcohol use: Not Currently    Comment: stopped 2018  . Drug use: No  . Sexual activity: Not on file  Other Topics Concern  . Not on file  Social History Narrative   Lives at home with his wife.   Right-handed.   Caffeine use: two cans of [facility].   Social Determinants of Health   Financial Resource Strain: Not on file  Food Insecurity: Not on file  Transportation Needs: Not on file  Physical Activity: Not on file  Stress: Not on file  Social Connections: Not on file  Intimate Partner Violence: Not  on file       Marcial Pacas, M.D. Ph.D.  [facility] [address] [place], [place] [address] Ph: [phone] Fax: [phone]  CC:  Anderson, Chelsey L, DO  [place],  [place] [id number]  Richarda Osmond, DO w

## 2020-10-01 NOTE — Procedures (Signed)
Full Name: [name] Gender: Male MRN #: [id number] Date of Birth: 01/28/1971    Visit Date: 09/30/2020 09:32 Age: 50 Years Examining Physician: Levert Feinstein, MD  Referring Physician: Levert Feinstein, MD History: 50 year old male, complains of left hand posturing, low back pain, frequent right calf muscle cramping  Summary of the test:  Nerve conduction study: Left sural, superficial peroneal, left median, ulnar sensory responses were normal.  Left median and ulnar mixed responses were normal. Left median, ulnar, tibial, peroneal to EDB motor responses were normal.  Electromyography:  Selective needle examination of left upper extremity muscles, left cervical paraspinal muscles; bilateral lower extremity muscles, and bilateral lumbosacral paraspinal muscles were essentially normal, other than the frequent spontaneous activity at left medial gastrocnemius, which correlate with visible muscle fasciculation   Conclusion: This is a normal study.  There is no electrodiagnostic evidence of large fiber peripheral neuropathy, bilateral lumbosacral radiculopathy or left cervical radiculopathy.    ------------------------------- Levert Feinstein, M.D. PhD  [facility] [address] [place], [place] [address] Tel: [phone] Fax: [phone]  Verbal informed consent was obtained from the patient, patient was informed of potential risk of procedure, including bruising, bleeding, hematoma formation, infection, muscle weakness, muscle pain, numbness, among others.        MNC    Nerve / Sites Muscle Latency Ref. Amplitude Ref. Rel Amp Segments Distance Velocity Ref. Area    ms ms mV mV %  cm m/s m/s mVms  L Median - APB     Wrist APB 3.7 ?4.4 10.0 ?4.0 100 Wrist - APB 7   47.7     Upper arm APB 8.0  8.3  83.6 Upper arm - Wrist 23 53 ?49 38.0  L Ulnar - ADM     Wrist ADM 2.8 ?3.3 7.6 ?6.0 100 Wrist - ADM 7   22.4     B.Elbow ADM 6.7  6.8  89.5 B.Elbow - Wrist  20 52 ?49 18.4     A.Elbow ADM 8.6  6.6  96.8 A.Elbow - B.Elbow 10 52 ?49 18.2  L Peroneal - EDB     Ankle EDB 4.9 ?6.5 5.2 ?2.0 100 Ankle - EDB 9   19.2     Fib head EDB 11.1  4.5  87.2 Fib head - Ankle 28 45 ?44 19.0     Pop fossa EDB 13.3  4.6  103 Pop fossa - Fib head 10 44 ?44 18.5         Pop fossa - Ankle      L Tibial - AH     Ankle AH 3.6 ?5.8 12.0 ?4.0 100 Ankle - AH 9   33.4     Pop fossa AH 12.9  8.6  71.6 Pop fossa - Ankle 38 41 ?41 29.9             SNC    Nerve / Sites Rec. Site Peak Lat Ref.  Amp Ref. Segments Distance Peak Diff Ref.    ms ms V V  cm ms ms  L Sural - Ankle (Calf)     Calf Ankle 3.4 ?4.4 25 ?6 Calf - Ankle 14    L Superficial peroneal - Ankle     Lat leg Ankle 3.7 ?4.4 11 ?6 Lat leg - Ankle 14    L Median, Ulnar - Transcarpal comparison     Median Palm Wrist 1.8 ?2.2 72 ?35 Median Palm - Wrist 8  Ulnar Palm Wrist 1.9 ?2.2 18 ?12 Ulnar Palm - Wrist 8          Median Palm - Ulnar Palm  -0.1 ?0.4  L Median - Orthodromic (Dig II, Mid palm)     Dig II Wrist 2.7 ?3.4 20 ?10 Dig II - Wrist 13    L Ulnar - Orthodromic, (Dig V, Mid palm)     Dig V Wrist 2.8 ?3.1 6 ?5 Dig V - Wrist 76                 F  Wave    Nerve F Lat Ref.   ms ms  L Tibial - AH 54.1 ?56.0  L Ulnar - ADM 30.5 ?32.0         EMG Summary Table    Spontaneous MUAP Recruitment  Muscle IA Fib PSW Fasc Other Amp Dur. Poly Pattern  L. Tibialis anterior Normal None None None _______ Normal Normal Normal Normal  L. Tibialis posterior Normal None None None _______ Normal Normal Normal Normal  L. Peroneus longus Normal None None None _______ Normal Normal Normal Normal  L. Gastrocnemius (Medial head) Normal None None None _______ Normal Normal Normal Normal  L. Vastus lateralis Normal None None None _______ Normal Normal Normal Normal  R. Tibialis anterior Normal None None None _______ Normal Normal Normal Normal  R. Tibialis posterior Normal None None None _______ Normal Normal Normal  Normal  R. Peroneus longus Normal None None None _______ Normal Normal Normal Normal  R. Gastrocnemius (Medial head) Normal None None None _______ Normal Normal Normal Normal  R. Vastus lateralis Normal None None None _______ Normal Normal Normal Normal  R. Lumbar paraspinals (low) Normal None None None _______ Normal Normal Normal Normal  R. Lumbar paraspinals (mid) Normal None None None _______ Normal Normal Normal Normal  L. Lumbar paraspinals (low) Normal None None None _______ Normal Normal Normal Normal  L. Lumbar paraspinals (mid) Normal None None None _______ Normal Normal Normal Normal  L. First dorsal interosseous Normal None None None _______ Normal Normal Normal Normal  L. Pronator teres Normal None None None _______ Normal Normal Normal Normal  L. Biceps brachii Normal None None None _______ Normal Normal Normal Normal  L. Deltoid Normal None None None _______ Normal Normal Normal Normal  L. Triceps brachii Normal None None None _______ Normal Normal Normal Normal  L. Cervical paraspinals Normal None None None _______ Normal Normal Normal Normal

## 2020-10-05 ENCOUNTER — Telehealth: Payer: Self-pay

## 2020-10-05 NOTE — Progress Notes (Signed)
Please call and advise the patient that the recent EMG and nerve conduction velocity test, which is the electrical nerve and muscle test we we performed, was reported as within normal limits. We checked for abnormal electrical discharges in the muscles or nerves and the report suggested normal findings. He can FU with Dr. Terrace Arabia as planned.   Huston Foley, MD, PhD

## 2020-10-05 NOTE — Telephone Encounter (Addendum)
I called the pt and advised of results via voicemail. Pt advised to call back if he had any questions.

## 2020-10-05 NOTE — Telephone Encounter (Signed)
-----   Message from Huston Foley, MD sent at 10/05/2020 12:47 PM EDT ----- Please call and advise the patient that the recent EMG and nerve conduction velocity test, which is the electrical nerve and muscle test we we performed, was reported as within normal limits. We checked for abnormal electrical discharges in the muscles or nerves and the report suggested normal findings. He can FU with Dr. Terrace Arabia as planned.   Huston Foley, MD, PhD

## 2020-10-13 NOTE — Procedures (Signed)
   HISTORY:  TECHNIQUE:  This is a routine 16 channel EEG recording with one channel devoted to a limited EKG recording.  It was performed during wakefulness, drowsiness and asleep.  Hyperventilation and photic stimulation were performed as activating procedures.  There are minimum muscle and movement artifact noted.  Upon maximum arousal, posterior dominant waking rhythm consistent of rhythmic alpha range activity, with frequency of 9 hz. Activities are symmetric over the bilateral posterior derivations and attenuated with eye opening.  Hyperventilation produced mild/moderate buildup with higher amplitude and the slower activities noted.  Photic stimulation did not alter the tracing.  During EEG recording, patient developed drowsiness and no deeper stage of sleep was achieved  During EEG recording, there was no epileptiform discharge noted.  EKG demonstrate sinus rhythm, with heart rate of 80 bpm  CONCLUSION: This is a  normal awake EEG.  There is no electrodiagnostic evidence of epileptiform discharge.  Levert Feinstein, M.D. Ph.D.  [facility] [address] [place], [place] [address] Phone: [phone] Fax:      [phone]

## 2020-12-10 ENCOUNTER — Other Ambulatory Visit: Payer: Self-pay | Admitting: Student in an Organized Health Care Education/Training Program

## 2020-12-10 ENCOUNTER — Ambulatory Visit: Payer: Self-pay | Admitting: Orthopedic Surgery

## 2020-12-10 DIAGNOSIS — E7800 Pure hypercholesterolemia, unspecified: Secondary | ICD-10-CM

## 2020-12-21 ENCOUNTER — Telehealth: Payer: Self-pay | Admitting: Family Medicine

## 2020-12-21 NOTE — Telephone Encounter (Signed)
Pre-Operative clearance form dropped off for at front desk for completion.  Verified that patient section of form has been completed.  Last DOS/WCC with PCP was 09/03/20.  Placed form in team folder to be completed by clinical staff.  Vilinda Blanks

## 2020-12-21 NOTE — Telephone Encounter (Signed)
Reviewed form and placed in PCP's box for completion.  .Jameil Whitmoyer R Osiel Stick, CMA  

## 2020-12-21 NOTE — Progress Notes (Signed)
    SUBJECTIVE:   CHIEF COMPLAINT / HPI:   [name] is a 50 year old male who presents for the issue below.  Hand swelling Ongoing since being diagnosed with Lyme disease in 2018.  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  Intermittently having issues with pain and being able to hold his grip due to pain.  Uses Voltaren gel which gives symptom relief for an hour but when pain returns it is excruciating.  Recently saw Ortho for neck issue.  Nerve conduction studies performed 1 month prior and were normal.  Has improved over the week he is able to now wear his wedding band but still states today will increase in size over the course of the day.  Saw rheumatology 4 years prior when initially diagnosed with Lyme disease.  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  There are sometimes warm as well.  PERTINENT  PMH / PSH: Cervical radiculopathy (sx in 20 days) , history of pancreatitis  OBJECTIVE:   BP 109/80   Pulse 83   Ht 6' (1.829 m)   Wt 240 lb 3.2 oz (109 kg)   SpO2 97%   BMI 32.58 kg/m   General: Appears to be in pain, no acute distress. Age appropriate. Extremities: Hands are mildly edematous and erythematous bilaterally but without warmth.  Exquisitely painful to palpation over all joints but most notably first digit bilaterally.  Interosseous strength intact.  Wrist extension reduced.    ASSESSMENT/PLAN:   Swelling of both hands Intermittent and chronic.  History of Lyme disease and hand arthralgia likely sequela of this.  Has seen rheum in the past and could benefit from follow-up with rheumatology. Plans to have neck surgery in 20 days for cervical radiculopathy which could possibly improve symptoms as well.  C-spine imaging from 2021 reviewed foraminal narrowing C5-T1. Would not start steroids at this time but will do an oral NSAID for relief as well as continue topical  NSAID.  Patient can follow-up as needed. - Ambulatory referral to Rheumatology - naproxen (NAPROSYN) 500 MG tablet; Take 1 tablet (500 mg total) by mouth 2 (two) times daily with a meal for 10 days.  Dispense: 20 tablet; Refill: 0  Lavonda Jumbo, DO  [facility]

## 2020-12-22 ENCOUNTER — Ambulatory Visit (INDEPENDENT_AMBULATORY_CARE_PROVIDER_SITE_OTHER): Payer: 59 | Admitting: Family Medicine

## 2020-12-22 ENCOUNTER — Other Ambulatory Visit: Payer: Self-pay

## 2020-12-22 VITALS — BP 109/80 | HR 83 | Ht 72.0 in | Wt 240.2 lb

## 2020-12-22 DIAGNOSIS — M79641 Pain in right hand: Secondary | ICD-10-CM

## 2020-12-22 DIAGNOSIS — Z1211 Encounter for screening for malignant neoplasm of colon: Secondary | ICD-10-CM

## 2020-12-22 DIAGNOSIS — M79642 Pain in left hand: Secondary | ICD-10-CM

## 2020-12-22 DIAGNOSIS — M7989 Other specified soft tissue disorders: Secondary | ICD-10-CM | POA: Insufficient documentation

## 2020-12-22 DIAGNOSIS — Z800 Family history of malignant neoplasm of digestive organs: Secondary | ICD-10-CM

## 2020-12-22 MED ORDER — NAPROXEN 500 MG PO TABS
500.0000 mg | ORAL_TABLET | Freq: Two times a day (BID) | ORAL | 0 refills | Status: AC
Start: 2020-12-22 — End: 2021-01-01

## 2020-12-22 NOTE — Patient Instructions (Addendum)
Today we discussed the swelling in your hands.  It is likely precipitated by your history of Lyme disease.  Continue Voltaren gel on the skin as needed.  Start naproxen 500 mg twice daily for total of 10 days.  I have also referred you to rheumatology for further follow-up.  Dr. Salvadore Dom

## 2020-12-22 NOTE — Assessment & Plan Note (Addendum)
Intermittent.  Acute on chronic.  History of Lyme disease and hand arthralgia likely sequela of this.  Has seen rheum in the past and could benefit from follow-up with rheumatology. Plans to have neck surgery in 20 days for cervical radiculopathy which could possibly improve symptoms as well.  C-spine imaging from 2021 reviewed foraminal narrowing C5-T1. Would not start steroids at this time but will do an oral NSAID for relief as well as continue topical NSAID.  Patient can follow-up as needed.

## 2020-12-25 ENCOUNTER — Encounter: Payer: Self-pay | Admitting: Family Medicine

## 2020-12-28 NOTE — Telephone Encounter (Signed)
Form signed and placed in triage box.   Lavonda Jumbo, DO 12/28/2020, 12:36 PM PGY-3, [facility]

## 2021-01-07 ENCOUNTER — Other Ambulatory Visit: Payer: Self-pay

## 2021-01-07 ENCOUNTER — Encounter: Payer: Self-pay | Admitting: Family Medicine

## 2021-01-07 ENCOUNTER — Ambulatory Visit (INDEPENDENT_AMBULATORY_CARE_PROVIDER_SITE_OTHER): Payer: 59 | Admitting: Family Medicine

## 2021-01-07 ENCOUNTER — Encounter: Payer: Self-pay | Admitting: Allergy & Immunology

## 2021-01-07 VITALS — BP 120/89 | HR 72 | Ht 72.0 in | Wt 239.4 lb

## 2021-01-07 DIAGNOSIS — R21 Rash and other nonspecific skin eruption: Secondary | ICD-10-CM

## 2021-01-07 NOTE — Progress Notes (Signed)
    SUBJECTIVE:   CHIEF COMPLAINT / HPI:   Rash: 50 year old male with a rash on arms/chest/legs which started 4 days. It started under left armpit.  In late 2018 diagnosed with lyme disease ("late disseminated lyme) plus [place] spotted fever and stayed in [place] for 14 days with a PICC line. He states he had a similar rash at that time. He said about 10 months ago he had no rash and no breakout until 4 days ago. Denies mucous membrane involvement. He did take a naproxen about 4-5 days before the rash started but otherwise denies new medications.  Denies being out in the yard or working around plants.  Denies fevers.  Denies vomiting.  He did previously follow with allergy but has not in some time.  He is not currently taking his antihistamine medication.  It is not really itchy but does sometimes hurt when getting hot water from the shower on it.  PERTINENT  PMH / PSH: History of lyme disease.  OBJECTIVE:   BP 120/89   Pulse 72   Ht 6' (1.829 m)   Wt 239 lb 6.4 oz (108.6 kg)   SpO2 99%   BMI 32.47 kg/m    General: NAD, pleasant, able to participate in exam HEENT: No pharyngeal erythema, no lesions noted in the buccal mucosa. Respiratory: CTAB, normal effort Skin: Fine rash present on the left axilla, anterior chest, and in a few places on the legs.  The patient has a photo of his rash from a few days ago which looks more urticarial in appearance.  No mucous membrane involvement Psych: Normal affect and mood       ASSESSMENT/PLAN:   Rash Broad differential for patient's fine rash.  The fact that it is not currently pruritic suggest against allergy though his initial photos from a few days ago do suggest urticaria in appearance.  I did bring Dr. Deirdre Priest in to take a look at the rash as well.  No mucous membrane involvement, no concern for SJS at this time.  Low concern for medication induced, however the patient did take an NSAID 4 days before this rash started.   Recommended that he follow back up with his allergist.  Patient asked about restarting his antihistamine I think this is reasonable.  I did provide return precautions with patient returning if he develops any fevers, systemic symptoms, worsening rash, worsening pain, or mucous membrane involvement.   Jackelyn Poling, DO [facility]

## 2021-01-07 NOTE — Patient Instructions (Signed)
I think it would be reasonable to start back antihistamine.  If the rash starts to worsen, if it starts to become painful, if you start to get any lesions in your mouth or on your eyes, or if you start experiencing fever or other systemic symptoms please let us know immediately.  I do recommend that you reach out to your allergist as this may have a component of an allergy to it with how the initial pictures on her phone looked.  If we can help in any way please let us know.

## 2021-01-07 NOTE — Assessment & Plan Note (Signed)
Broad differential for patient's fine rash.  The fact that it is not currently pruritic suggest against allergy though his initial photos from a few days ago do suggest urticaria in appearance.  I did bring Dr. Deirdre Priest in to take a look at the rash as well.  No mucous membrane involvement, no concern for SJS at this time.  Low concern for medication induced, however the patient did take an NSAID 4 days before this rash started.  Recommended that he follow back up with his allergist.  Patient asked about restarting his antihistamine I think this is reasonable.  I did provide return precautions with patient returning if he develops any fevers, systemic symptoms, worsening rash, worsening pain, or mucous membrane involvement.

## 2021-01-11 NOTE — Progress Notes (Addendum)
Surgical Instructions    Your procedure is scheduled on Wednesday August 24th.  Report to [facility] Main Entrance "A" at 5:30 A.M., then check in with the Admitting office.  Call this number if you have problems the morning of surgery:  [phone]   If you have any questions prior to your surgery date call [phone]: Open Monday-Friday 8am-4pm    Remember:  Do not eat after midnight the night before your surgery  You may drink clear liquids until 4:30am the morning of your surgery.   Clear liquids allowed are: Water, Non-Citrus Juices (without pulp), Carbonated Beverages, Clear Tea, Black Coffee Only, and Gatorade    Take these medicines the morning of surgery with A SIP OF WATER  fluticasone (FLONASE) 50 MCG/ACT nasal spray gabapentin (NEURONTIN) 300 MG capsule rosuvastatin (CRESTOR) 20 MG tablet  IF NEEDED Oxycodone HCl 10 MG TABS   As of today, STOP taking any Aspirin (unless otherwise instructed by your surgeon) Voltarn gel, Aleve, Naproxen, Ibuprofen, Motrin, Advil, Goody's, BC's, all herbal medications, fish oil, and all vitamins.          Do not wear jewelry  Do not wear lotions, powders, colognes, or deodorant. Do not shave 48 hours prior to surgery.  Men may shave face and neck. Do not bring valuables to the hospital. DO Not wear nail polish, gel polish, artificial nails, or any other type of covering on  natural nails including finger and toenails. If patients have artificial nails, gel coating, etc. that need to be removed by a nail salon please have this removed prior to surgery or surgery may need to be canceled/delayed if the surgeon/ anesthesia feels like the patient is unable to be adequately monitored.             [facility] is not responsible for any belongings or valuables.  Do NOT Smoke (Tobacco/Vaping) or drink Alcohol 24 hours prior to your procedure If you use a CPAP at night, you may bring all equipment for your overnight stay.   Contacts,  glasses, dentures or bridgework may not be worn into surgery, please bring cases for these belongings   For patients admitted to the hospital, discharge time will be determined by your treatment team.   Patients discharged the day of surgery will not be allowed to drive home, and someone needs to stay with them for 24 hours.  ONLY 1 SUPPORT PERSON MAY BE PRESENT WHILE YOU ARE IN SURGERY. IF YOU ARE TO BE ADMITTED ONCE YOU ARE IN YOUR ROOM YOU WILL BE ALLOWED TWO (2) VISITORS.  Minor children may have two parents present. Special consideration for safety and communication needs will be reviewed on a case by case basis.  Special instructions:    Oral Hygiene is also important to reduce your risk of infection.  Remember - BRUSH YOUR TEETH THE MORNING OF SURGERY WITH YOUR REGULAR TOOTHPASTE   [place]- Preparing For Surgery  Before surgery, you can play an important role. Because skin is not sterile, your skin needs to be as free of germs as possible. You can reduce the number of germs on your skin by washing with CHG (chlorahexidine gluconate) Soap before surgery.  CHG is an antiseptic cleaner which kills germs and bonds with the skin to continue killing germs even after washing.     Please do not use if you have an allergy to CHG or antibacterial soaps. If your skin becomes reddened/irritated stop using the CHG.  Do not shave (including legs  and underarms) for at least 48 hours prior to first CHG shower. It is OK to shave your face.  Please follow these instructions carefully.     Shower the NIGHT BEFORE SURGERY and the MORNING OF SURGERY with CHG Soap.   If you chose to wash your hair, wash your hair first as usual with your normal shampoo. After you shampoo, rinse your hair and body thoroughly to remove the shampoo.  Then [employer] and genitals (private parts) with your normal soap and rinse thoroughly to remove soap.  After that Use CHG Soap as you would any other liquid soap. You can  apply CHG directly to the skin and wash gently with a scrungie or a clean washcloth.   Apply the CHG Soap to your body ONLY FROM THE NECK DOWN.  Do not use on open wounds or open sores. Avoid contact with your eyes, ears, mouth and genitals (private parts). Wash Face and genitals (private parts)  with your normal soap.   Wash thoroughly, paying special attention to the area where your surgery will be performed.  Thoroughly rinse your body with warm water from the neck down.  DO NOT shower/wash with your normal soap after using and rinsing off the CHG Soap.  Pat yourself dry with a CLEAN TOWEL.  Wear CLEAN PAJAMAS to bed the night before surgery  Place CLEAN SHEETS on your bed the night before your surgery  DO NOT SLEEP WITH PETS.   Day of Surgery:  Take a shower with CHG soap. Wear Clean/Comfortable clothing the morning of surgery Do not apply any deodorants/lotions.   Remember to brush your teeth WITH YOUR REGULAR TOOTHPASTE.   Please read over the following fact sheets that you were given.

## 2021-01-12 ENCOUNTER — Ambulatory Visit: Payer: Self-pay | Admitting: Orthopedic Surgery

## 2021-01-12 ENCOUNTER — Other Ambulatory Visit: Payer: Self-pay

## 2021-01-12 ENCOUNTER — Encounter (HOSPITAL_COMMUNITY): Payer: Self-pay

## 2021-01-12 ENCOUNTER — Encounter (HOSPITAL_COMMUNITY)
Admission: RE | Admit: 2021-01-12 | Discharge: 2021-01-12 | Disposition: A | Discharge status: Home / Self Care | Payer: 59 | Source: Ambulatory Visit | Attending: Orthopedic Surgery | Admitting: Orthopedic Surgery

## 2021-01-12 ENCOUNTER — Ambulatory Visit (HOSPITAL_COMMUNITY)
Admission: RE | Admit: 2021-01-12 | Discharge: 2021-01-12 | Disposition: A | Discharge status: Home / Self Care | Payer: 59 | Source: Ambulatory Visit | Attending: Orthopedic Surgery | Admitting: Orthopedic Surgery

## 2021-01-12 DIAGNOSIS — Z01818 Encounter for other preprocedural examination: Secondary | ICD-10-CM | POA: Diagnosis not present

## 2021-01-12 LAB — BASIC METABOLIC PANEL
Anion gap: 9 (ref 5–15)
BUN: 12 mg/dL (ref 6–20)
CO2: 24 mmol/L (ref 22–32)
Calcium: 9.3 mg/dL (ref 8.9–10.3)
Chloride: 106 mmol/L (ref 98–111)
Creatinine, Ser: 1.02 mg/dL (ref 0.61–1.24)
GFR, Estimated: >60 mL/min (ref >60)
Glucose, Bld: 132 mg/dL — ABNORMAL HIGH (ref 70–99)
Potassium: 3.5 mmol/L (ref 3.5–5.1)
Sodium: 139 mmol/L (ref 135–145)

## 2021-01-12 LAB — TYPE AND SCREEN
ABO/RH(D): A POS
Antibody Screen: NEGATIVE

## 2021-01-12 LAB — CBC
HCT: 43.8 % (ref 39.0–52.0)
Hemoglobin: 14.8 g/dL (ref 13.0–17.0)
MCH: 30.1 pg (ref 26.0–34.0)
MCHC: 33.8 g/dL (ref 30.0–36.0)
MCV: 89 fL (ref 80.0–100.0)
Platelets: 201 10*3/uL (ref 150–400)
RBC: 4.92 MIL/uL (ref 4.22–5.81)
RDW: 12.4 % (ref 11.5–15.5)
WBC: 9.8 10*3/uL (ref 4.0–10.5)
nRBC: 0 % (ref 0.0–0.2)

## 2021-01-12 LAB — URINALYSIS, ROUTINE W REFLEX MICROSCOPIC
Bacteria, UA: NONE SEEN
Bilirubin Urine: NEGATIVE
Glucose, UA: NEGATIVE mg/dL
Ketones, ur: NEGATIVE mg/dL
Leukocytes,Ua: NEGATIVE
Nitrite: NEGATIVE
Protein, ur: NEGATIVE mg/dL
Specific Gravity, Urine: 1.027 (ref 1.005–1.030)
pH: 5 (ref 5.0–8.0)

## 2021-01-12 LAB — APTT: aPTT: 41 seconds — ABNORMAL HIGH (ref 24–36)

## 2021-01-12 LAB — SURGICAL PCR SCREEN
MRSA, PCR: NEGATIVE
Staphylococcus aureus: POSITIVE — AB

## 2021-01-12 LAB — PROTIME-INR
INR: 1 (ref 0.8–1.2)
Prothrombin Time: 13.2 seconds (ref 11.4–15.2)

## 2021-01-12 NOTE — Progress Notes (Signed)
PCP - Randa Evens Autry-Lott DO Cardiologist - Dr. Jeri Cos  Chest x-ray - 01/12/21 EKG - 06/08/20 Stress Test - Denies ECHO - 01/16/20 Cardiac Cath - Denies  Sleep Study - Denies no OSA  DM - Denies  COVID TEST- 01/18/21   Anesthesia review: Yes per MD request  Patient denies shortness of breath, fever, cough and chest pain at PAT appointment   All instructions explained to the patient, with a verbal understanding of the material. Patient agrees to go over the instructions while at home for a better understanding. Patient also instructed to wear a mask while in public after being tested for COVID-19. The opportunity to ask questions was provided.

## 2021-01-12 NOTE — H&P (Signed)
Subjective:   [name] is a pleasant 50 year old male who presents today for preop H&P.  He is scheduled for ACDF C5-T1 On 01/20/21 At [facility] With Dr. Shon Baton.  Patient Active Problem List   Diagnosis Date Noted   Swelling of both hands 12/22/2020   Neck pain on left side 09/30/2020   Gait abnormality 09/30/2020   Left-sided weakness 09/01/2020   History of Lyme disease 09/01/2020   Abdominal pain, chronic, epigastric 08/18/2020   Unintentional weight loss 08/18/2020   Chronic constipation 08/18/2020   History of pancreatitis 08/18/2020   Family history of colon cancer 08/18/2020   Sinusitis 06/07/2020   Cervical radiculopathy 05/14/2020   Rash 01/09/2020   Essential hypertension 01/03/2020   Vitamin D deficiency 11/26/2019   Hyperglycemia 11/26/2019   Tobacco use 10/11/2019   Hyperlipemia    S/P lumbar fusion 11/15/2017   Past Medical History:  Diagnosis Date   Angio-edema    Anxiety    Disseminated Lyme disease    Dystonia    Headache    Lyme disease    Neck pain    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    Urticaria     Past Surgical History:  Procedure Laterality Date   ADENOIDECTOMY     BACK SURGERY     SACROILIAC JOINT FUSION Right 11/15/2017   Procedure: SACROILIAC JOINT FUSION;  Surgeon: Venita Lick, MD;  Location: [facility] OR;  Service: Orthopedics;  Laterality: Right;  120 mins   SACROILIAC JOINT FUSION Left 07/25/2018   Procedure: SACROILIAC JOINT FUSION;  Surgeon: Venita Lick, MD;  Location: [facility] OR;  Service: Orthopedics;  Laterality: Left;  SACROILIAC JOINT FUSION   TONSILLECTOMY     50 years old    Current Outpatient Medications  Medication Sig Dispense Refill Last Dose   diclofenac Sodium (VOLTAREN) 1 % GEL Apply 2-4 g topically 4 (four) times daily as needed (pain).      fluticasone (FLONASE) 50 MCG/ACT nasal spray Place 2 sprays into both nostrils daily. 16 g 6    gabapentin (NEURONTIN) 300 MG capsule Take 1 capsule (300 mg total) by mouth 3 (three) times daily. 90  capsule 11    Oxycodone HCl 10 MG TABS Take 10 mg by mouth 4 (four) times daily as needed (pain).      pseudoephedrine (SUDAFED) 120 MG 12 hr tablet Take 120 mg by mouth daily.      rosuvastatin (CRESTOR) 20 MG tablet TAKE 1 TABLET(20 MG) BY MOUTH DAILY 90 tablet 3    valsartan (DIOVAN) 40 MG tablet TAKE 1 TABLET(40 MG) BY MOUTH DAILY (Patient taking differently: Take 40 mg by mouth in the morning.) 90 tablet 3    No current facility-administered medications for this visit.   Allergies  Allergen Reactions   Pregabalin Hives, Shortness Of Breath and Other (See Comments)    **ATTEMPTING TO TAKE THIS MEDICATION AGAIN** Insomnia Mood alter   Sulfa Antibiotics Anaphylaxis   Gabapentin Hives, Nausea And Vomiting and Nausea Only    Sweating, Fever, Hives Other reaction(s): Abdominal pain, Fever, Insomnia    Social History   Tobacco Use   Smoking status: Every Day    Packs/day: 0.50    Years: 30.00    Pack years: 15.00    Types: Cigarettes   Smokeless tobacco: Never  Substance Use Topics   Alcohol use: Not Currently    Comment: stopped 2018    Family History  Problem Relation Age of Onset   Heart disease Mother    Heart disease Father  Colon polyps Father    Colon cancer Paternal Grandfather        dx at age 79   Esophageal cancer Neg Hx    Inflammatory bowel disease Neg Hx    Liver disease Neg Hx    Pancreatic cancer Neg Hx    Stomach cancer Neg Hx     Review of Systems Pertinent items noted in HPI and remainder of comprehensive ROS otherwise negative.  Objective:   Vitals: Ht: 5 ft 10 in 01/11/2021 11:06 am BP: 110/80 01/11/2021 11:08 am Pulse: 82 bpm 01/11/2021 11:07 am  General: A&Ox3, NAD  Heart: RRR, no rubs, murmers, or gallops.  Lungs: CTAB  Abdomen: BSx4, non-tender, non-distended, no rebound tenderness.   Neuro: [name] continues to have significant neck and left radicular arm pain. He describes numbness and dysesthesias in the C7 and C8 dermatome. He  notes some occasional right arm dysesthesias primarily in the C6 dermatome. 5/5 UE motor strength bilaterally. Negative Hoffman test, 1+ symmetrical deep and reflexes throughout. Normal gait pattern    Cervical MRI: completed on 12/01/20 was reviewed with the patient. [facility] was completed at [facility]; I have independently reviewed the images as well as the radiology report. No cord signal changes. Severe bilateral narrowing, severe left C7-T1 neural foraminal narrowing, severe right C5-6 neural foraminal narrowing. Mild to moderate degenerative changes in the remainder of the cervical spine.   Assessment:   [name] is a very pleasant 50 year old gentleman with persistent neck and radicular left arm pain. Patient had a C8 selective nerve root block on the left side and received about 2 weeks of significant improvement. 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 He also has right C6 neural foraminal narrowing which could account for the episodic right radicular arm pain. At this point time despite the injection therapy, and the cervical physical therapy had last year but his quality-of-life continues to deteriorate. He is expressed a desire to move forward with surgery.  Plan:   We have gone over the surgical procedure in great detail which would be a C6-T1 ACDF. All of his questions were encouraged and addressed.Risks and benefits of surgery were discussed with the patient. These include: Infection, bleeding, death, stroke, paralysis, ongoing or worse pain, need for additional surgery, nonunion, leak of spinal fluid, adjacent segment degeneration requiring additional fusion surgery. Pseudoarthrosis (nonunion)requiring supplemental posterior fixation. Throat pain, swallowing difficulties, hoarseness or change in voice.  We will obtain preoperative medical clearance from the patient's primary care provider.  I have reviewed patient's medication  list with him. He is only but that is. Not using any aspirin products. Not using any NSAIDs.  Unfortunately, patient missed his appointment with physical therapy prior to this appointment and was not fitted for an Aspen collar with PT we did provide him an Aspen collar at his H&P visit today.  We have also discussed the post-operative recovery period to include: bathing/showering restrictions, wound healing, activity (and driving) restrictions, medications/pain mangement.  We have also discussed post-operative redflags to include: signs and symptoms of postoperative infection, DVT/PE.  Discharge instructions reviewed with the patient. All of his questions were invited and answered  Follow-up: 2 weeks postop

## 2021-01-12 NOTE — H&P (Deleted)
  The note originally documented on this encounter has been moved the the encounter in which it belongs.  

## 2021-01-13 ENCOUNTER — Telehealth: Payer: Self-pay

## 2021-01-13 ENCOUNTER — Other Ambulatory Visit: Payer: Self-pay | Admitting: Orthopedic Surgery

## 2021-01-13 NOTE — Anesthesia Preprocedure Evaluation (Addendum)
Anesthesia Evaluation  Patient identified by MRN, date of birth, ID band Patient awake    Reviewed: Allergy & Precautions, NPO status , Patient's Chart, lab work & pertinent test results  Airway Mallampati: II  TM Distance: >3 FB Neck ROM: Full    Dental no notable dental hx. (+) Teeth Intact, Dental Advisory Given   Pulmonary Current Smoker and Patient abstained from smoking.,    Pulmonary exam normal breath sounds clear to auscultation       Cardiovascular hypertension, Pt. on medications Normal cardiovascular exam Rhythm:Regular Rate:Normal  CT Coronary 01/28/20: IMPRESSION: 1. Coronary calcium score of 0. This was 0 percentile for age and sex matched control. 2. Normal coronary origin with right dominance. 3. No evidence of CAD. 4. Consider non-atherosclerotic causes of chest pain.   Echo 01/16/20: IMPRESSIONS  1. Left ventricular ejection fraction, by estimation, is 55 to 60%. The  left ventricle has normal function. The left ventricle has no regional  wall motion abnormalities. Left ventricular diastolic parameters were  normal.  2. Right ventricular systolic function is normal. The right ventricular  size is normal. Tricuspid regurgitation signal is inadequate for assessing  PA pressure.  3. The mitral valve is normal in structure. No evidence of mitral valve  regurgitation. No evidence of mitral stenosis.  4. The aortic valve is grossly normal. Aortic valve regurgitation is not  visualized. No aortic stenosis is present.  5. The inferior vena cava is normal in size with greater than 50%  respiratory variability, suggesting right atrial pressure of 3 mmHg.  - Comparison(s): No prior Echocardiogram.  - Conclusion(s)/Recommendation(s): Normal biventricular function without  evidence of hemodynamically significant valvular heart disease.    Neuro/Psych  Headaches, Anxiety Lyme dz  Neuromuscular disease     GI/Hepatic negative GI ROS, Neg liver ROS,   Endo/Other  negative endocrine ROS  Renal/GU negative Renal ROS     Musculoskeletal negative musculoskeletal ROS (+)   Abdominal (+) + obese (BMI 34.58),   Peds  Hematology Lab Results      Component                Value               Date                      WBC                      9.8                 01/12/2021                HGB                      14.8                01/12/2021                HCT                      43.8                01/12/2021                MCV                      89.0  01/12/2021                PLT                      201                 01/12/2021              Anesthesia Other Findings   Reproductive/Obstetrics                           Anesthesia Physical Anesthesia Plan  ASA: 2  Anesthesia Plan: General   Post-op Pain Management:    Induction: Intravenous  PONV Risk Score and Plan: 2 and Treatment may vary due to age or medical condition, Midazolam and Ondansetron  Airway Management Planned: Video Laryngoscope Planned  Additional Equipment: None  Intra-op Plan:   Post-operative Plan: Extubation in OR  Informed Consent: I have reviewed the patients History and Physical, chart, labs and discussed the procedure including the risks, benefits and alternatives for the proposed anesthesia with the patient or authorized representative who has indicated his/her understanding and acceptance.     Dental advisory given  Plan Discussed with: CRNA and Anesthesiologist  Anesthesia Plan Comments: (PAT note written 01/13/2021 by Shonna Chock, PA-C. )      Anesthesia Quick Evaluation

## 2021-01-13 NOTE — Telephone Encounter (Signed)
Received phone call from [place], [place] at [facility] regarding patient's pre op labs. Reports elevated PTT at 41. Provider is inquiring if this will need additional work up prior to patient's procedure.   Please advise.   Veronda Prude, RN

## 2021-01-13 NOTE — Progress Notes (Signed)
Anesthesia Chart Review:  Case: [id number] Date/Time: 01/20/21 0715   Procedure: ANTERIOR CERVICAL DECOMPRESSION/DISCECTOMY FUSION 4 LEVELS C5-T1   Anesthesia type: General   Pre-op diagnosis: Cervical spondylotic radiculopathy   [facility]: [facility] ROOM 04 / [facility]   Surgeons: Venita Lick, MD       DISCUSSION: Patient is a 50 year old male scheduled for the above procedure.  History includes smoking, HTN, disseminated Lyme disease (2018), [facility] Spotted Fever (2018), idiopathic urticaria, angioedema, anxiety, seizures (childhood), foot drop (right 2000), dystonia (affecting LUE/LEE 08/2020), back surgery (right L5-S1 microdiscectomy 06/05/00, redo 07/26/06; L5-S1 ALIF 12/19/07; right SI joint fusion 11/15/17 & left 07/25/18). BMI is consistent with obesity.   Preoperative labs are unremarkable except for elevated aPTT of 41. INR normal at 13.2. Message left for [facility] at Dr. Shon Baton' office regarding results. He is not on anticoagulation therapy. He has undergone multiple spinal/orthopedic surgeries. Defer additional preoperative orders, if any, to surgeon. (I asked Cordelia Pen to let me know if Dr. Shon Baton desired any repeat labs.)  Preoperative COVID-19 testing is scheduled for 01/18/2021.  Anesthesia team to evaluate on the day of surgery.   VS: BP 116/78   Pulse 69   Temp 36.9 C (Oral)   Resp 18   Ht 6' (1.829 m)   Wt 107.2 kg   SpO2 100%   BMI 32.04 kg/m    PROVIDERS: Lavonda Jumbo, DO is PCP. Medical clearance note for surgery signed on 12/28/20 (Media tab). Sheran Luz, MD is Physiatrist/Pain Management Nanetta Batty, MD is cardiologist. As needed follow-up recommended at 02/10/20 visit to discuss reassuring echo and CCTA. Malachi Bonds, MD is Allergist [place], Vicente Serene, MD is GI Levert Feinstein, MD is neurologist - Referred to Rheumatologist Sheliah Hatch, MD on 12/22/20.    LABS: Preoperative labs noted. See DISCUSSION. (all labs ordered are listed, but only abnormal  results are displayed)  Labs Reviewed  SURGICAL PCR SCREEN - Abnormal; Notable for the following components:      Result Value   Staphylococcus aureus POSITIVE (*)    All other components within normal limits  BASIC METABOLIC PANEL - Abnormal; Notable for the following components:   Glucose, Bld 132 (*)    All other components within normal limits  APTT - Abnormal; Notable for the following components:   aPTT 41 (*)    All other components within normal limits  URINALYSIS, ROUTINE W REFLEX MICROSCOPIC - Abnormal; Notable for the following components:   Hgb urine dipstick SMALL (*)    All other components within normal limits  CBC  PROTIME-INR  TYPE AND SCREEN    OTHER: EEG 09/28/20: CONCLUSION: This is a  normal awake EEG.  There is no electrodiagnostic evidence of epileptiform discharge.   IMAGES: CXR 01/12/21: FINDINGS: The heart size and mediastinal contours are within normal limits. Both lungs are clear. The visualized skeletal structures are unremarkable. IMPRESSION: No active cardiopulmonary disease.  MRI Brain 09/05/20: IMPRESSION: 1.   In the cerebral hemispheres, there are some scattered T2/FLAIR hyperintense foci in the subcortical and deep white matter.  None of these appear to be acute.  They do not enhance.  They are most consistent with mild chronic microvascular ischemic change. 2.   Chronic ethmoid and frontal sinusitis 3.   No acute findings.  Normal enhancement pattern.  MRI C-spine 08/06/19 (CE): C1-C2:  No substantial canal or foraminal stenosis.  C2-C3: Facet degenerative changes. No substantial canal or foraminal stenosis.  C3-C4:  Small posterior disc osteophyte complex resulting in mild  canal stenosis. Uncovertebral joint and facet hypertrophy results in mild right foraminal narrowing.  C4-C5: Mild posterior disc bulge does not result in substantial spinal canal stenosis. Facet and uncovertebral joint hypertrophy result in mild left neural foraminal  narrowing.  C5-C6: Posterior disc osteophyte complex, uncovertebral joint hypertrophy, facet joint hypertrophy, and ligamentum flavum thickening result in mild canal stenosis and advanced right and moderate left foraminal stenosis.  C6-C7: Disc space narrowing with endplate edema related to degenerative change. Posterior disc osteophyte complex, uncovertebral joint hypertrophy, facet joint hypertrophy, and ligamentum flavum thickening result in mild to moderate canal stenosis and advanced left greater than right foraminal stenosis.  C7-T1: A combination of endplate osteophyte and lateral disc protrusion results in advanced left and moderate to advanced right neural foraminal stenosis. There is minimal canal stenosis.  IMPRESSION: - Multilevel cervical spondylosis, as detailed above, contributes to multilevel moderate to advanced foraminal stenosis, greatest at C5-C6 on the right and bilaterally at C6-7 and C7-T1 (left greater than right). There is mild canal stenosis at C5-C6 and C6-C7.  - Degenerative changes at T1-2 result in moderate to advanced right foraminal stenosis, incompletely evaluated.   EKG: 06/07/2020: Normal sinus rhythm.  Rightward axis.   CV: CT Coronary 01/28/20: IMPRESSION: 1. Coronary calcium score of 0. This was 0 percentile for age and sex matched control. 2. Normal coronary origin with right dominance. 3. No evidence of CAD. 4.  Consider non-atherosclerotic causes of chest pain.    Echo 01/16/20: IMPRESSIONS   1. Left ventricular ejection fraction, by estimation, is 55 to 60%. The  left ventricle has normal function. The left ventricle has no regional  wall motion abnormalities. Left ventricular diastolic parameters were  normal.   2. Right ventricular systolic function is normal. The right ventricular  size is normal. Tricuspid regurgitation signal is inadequate for assessing  PA pressure.   3. The mitral valve is normal in structure. No evidence of mitral valve   regurgitation. No evidence of mitral stenosis.   4. The aortic valve is grossly normal. Aortic valve regurgitation is not  visualized. No aortic stenosis is present.   5. The inferior vena cava is normal in size with greater than 50%  respiratory variability, suggesting right atrial pressure of 3 mmHg.  - Comparison(s): No prior Echocardiogram.  - Conclusion(s)/Recommendation(s): Normal biventricular function without  evidence of hemodynamically significant valvular heart disease.    Past Medical History:  Diagnosis Date   Angio-edema    Anxiety    Disseminated Lyme disease    Dystonia    Foot drop, right 2000   Headache    Hypertension 2019   Lyme disease    Neck pain    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    Urticaria     Past Surgical History:  Procedure Laterality Date   ADENOIDECTOMY     BACK SURGERY     SACROILIAC JOINT FUSION Right 11/15/2017   Procedure: SACROILIAC JOINT FUSION;  Surgeon: Venita Lick, MD;  Location: [facility] OR;  Service: Orthopedics;  Laterality: Right;  120 mins   SACROILIAC JOINT FUSION Left 07/25/2018   Procedure: SACROILIAC JOINT FUSION;  Surgeon: Venita Lick, MD;  Location: [facility] OR;  Service: Orthopedics;  Laterality: Left;  SACROILIAC JOINT FUSION   TONSILLECTOMY     51 years old    MEDICATIONS:  diclofenac Sodium (VOLTAREN) 1 % GEL   fluticasone (FLONASE) 50 MCG/ACT nasal spray   gabapentin (NEURONTIN) 300 MG capsule   Oxycodone HCl 10 MG TABS   pseudoephedrine (  SUDAFED) 120 MG 12 hr tablet   rosuvastatin (CRESTOR) 20 MG tablet   valsartan (DIOVAN) 40 MG tablet   No current facility-administered medications for this encounter.    Shonna Chock, PA-C Surgical Short Stay/Anesthesiology [facility] Phone [phone] [facility] Phone [phone] 01/13/2021 2:07 PM

## 2021-01-15 ENCOUNTER — Ambulatory Visit: Payer: Self-pay | Admitting: Orthopedic Surgery

## 2021-01-15 NOTE — Telephone Encounter (Signed)
Attempted to return call to [facility], no answer. Left VM requesting her to return call to office.   Veronda Prude, RN

## 2021-01-15 NOTE — Telephone Encounter (Signed)
[name] returns phone call to nurse line. [name] reports that they typically perform repeat studies the day of the procedure.  PA would also like to know if the procedure needs to be postponed. Procedure is currently scheduled for 8/24.  Please call provider back to discuss further at [phone].  Veronda Prude, RN

## 2021-01-18 ENCOUNTER — Other Ambulatory Visit: Payer: Self-pay | Admitting: Orthopedic Surgery

## 2021-01-18 LAB — SARS CORONAVIRUS 2 (TAT 6-24 HRS): SARS Coronavirus 2: NEGATIVE

## 2021-01-18 NOTE — Progress Notes (Signed)
Office Visit Note  Patient: [name]             Date of Birth: [birth year]           MRN: [id number]             PCP: Gerlene Fee, DO Referring: McDiarmid, Blane Ohara, MD Visit Date: 01/19/2021  Subjective:  New Patient (Initial Visit) (Patient complains of bilateral hand, wrist, and ankle pain. Patient complains of hand/wrist pain so severe he is unable to function properly.)   History of Present Illness: [name] is a 50 y.o. male here for chronic pain and swelling of bilateral hands. He has had joint pains and inflammation since at least 2018 when he was evaluated with [facility] healthcare system including rheumatology and infectious disease workups. These were broadly negative for numerous serology including RF, CCP, ANA, and B. Burgdorferi Abs. He completed treatments with ceftriaxone and doxycycline at the time and no ongoing infectious concerns described by ID clinic. Rheumatology discussion of possible trying conventional DMARD in additional to chronic NSAID use. He has had ongoing subsequent orthopedics care with numerous degenerative and postraumatic changes in his cervical and lumbar spine and shoulders and previous SI joint fusion.  He was scheduled for anterior cervical spine decompression surgery urgency planned for tomorrow but apparently being postponed due to abnormal clotting factor test. Currently his worst complaint is increasing pain swelling and stiffness affecting his bilateral hands and wrist severely limiting his activities.  Pain is worst on the thumb and around the MCP joints right hand worse than left.  He has trouble gripping objects tightly and when he uses his hands a lot during the day will experience worsening symptoms for up to 3 days afterwards.  Occasionally there is redness overlying the affected joints when they are in exacerbation but otherwise often appears normal.  He is also developed difficulty fully extending his fingers  especially the fourth and fifth fingers of his left hand.  His thumb catches in a fixed position on the right side requiring use of his other hand or an object to restore motion.  Labs reviewed 07/2020 ESR 19 CRP 7.46  01/2020 ANA neg ESR 47 CRP 13  Activities of Daily Living:  Patient reports morning stiffness for 24 hours.   Patient Reports nocturnal pain.  Difficulty dressing/grooming: Reports Difficulty climbing stairs: Reports Difficulty getting out of chair: Reports Difficulty using hands for taps, buttons, cutlery, and/or writing: Reports  Review of Systems  Constitutional:  Positive for fatigue.  HENT:  Positive for mouth dryness and nose dryness. Negative for mouth sores.   Eyes:  Positive for visual disturbance and dryness. Negative for pain and itching.  Respiratory:  Positive for cough. Negative for hemoptysis, shortness of breath and difficulty breathing.   Cardiovascular:  Negative for chest pain, palpitations and swelling in legs/feet.  Gastrointestinal:  Positive for abdominal pain, constipation and diarrhea. Negative for blood in stool.  Endocrine: Negative for increased urination.  Genitourinary:  Negative for painful urination.  Musculoskeletal:  Positive for joint pain, joint pain, joint swelling, myalgias, muscle weakness, morning stiffness, muscle tenderness and myalgias.  Skin:  Positive for rash. Negative for color change and redness.  Allergic/Immunologic: Negative for susceptible to infections.  Neurological:  Positive for dizziness, numbness and memory loss. Negative for headaches and weakness.  Hematological:  Negative for swollen glands.  Psychiatric/Behavioral:  Positive for sleep disturbance. Negative for confusion.    PMFS History:  Patient Active  Problem List   Diagnosis Date Noted   Low back pain 01/19/2021   Swelling of both hands 12/22/2020   Neck pain on left side 09/30/2020   Gait abnormality 09/30/2020   Left-sided weakness 09/01/2020    History of Lyme disease 09/01/2020   Abdominal pain, chronic, epigastric 08/18/2020   Unintentional weight loss 08/18/2020   Chronic constipation 08/18/2020   History of pancreatitis 08/18/2020   Family history of colon cancer 08/18/2020   Sinusitis 06/07/2020   Pancreatitis 05/21/2020   Cervical radiculopathy 05/14/2020   Rash 01/09/2020   Essential hypertension 01/03/2020   Vitamin D deficiency 11/26/2019   Hyperglycemia 11/26/2019   Tobacco use 10/11/2019   Hyperlipemia    Fatty liver 05/06/2019   S/P lumbar fusion 11/15/2017   Labral tear of shoulder, degenerative, left 02/21/2017   Elevated LFTs 01/09/2017   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    Past Medical History:  Diagnosis Date   Angio-edema    Anxiety    Disseminated Lyme disease    Dystonia    Foot drop, right 2000   Headache    Hypertension 2019   Lyme disease    Neck pain    Rocky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    Urticaria     Family History  Problem Relation Age of Onset   Heart disease Mother    Heart disease Father    Colon polyps Father    ALS Sister    Osteoarthritis Brother    Colon cancer Paternal Grandfather        dx at age 2   Healthy Son    Healthy Son    Esophageal cancer Neg Hx    Inflammatory bowel disease Neg Hx    Liver disease Neg Hx    Pancreatic cancer Neg Hx    Stomach cancer Neg Hx    Past Surgical History:  Procedure Laterality Date   ADENOIDECTOMY     BACK SURGERY     SACROILIAC JOINT FUSION Right 11/15/2017   Procedure: SACROILIAC JOINT FUSION;  Surgeon: Melina Schools, MD;  Location: [place];  Service: Orthopedics;  Laterality: Right;  120 mins   SACROILIAC JOINT FUSION Left 07/25/2018   Procedure: SACROILIAC JOINT FUSION;  Surgeon: Melina Schools, MD;  Location: [place];  Service: Orthopedics;  Laterality: Left;  SACROILIAC JOINT FUSION   TONSILLECTOMY     50 years old   Social History   Social History Narrative   Lives at home with his wife.   Right-handed.    Caffeine use: two cans of [facility].   Immunization History  Administered Date(s) Administered   Influenza Split 01/09/2017   Influenza,inj,quad, With Preservative 01/09/2017   Tdap 11/16/2004     Objective: Vital Signs: BP 124/85 (BP Location: Right Arm, Patient Position: Sitting, Cuff Size: Normal)   Pulse 73   Ht $R'5\' 10"'Mh$  (1.778 m)   Wt 241 lb (109.3 kg)   BMI 34.58 kg/m    Physical Exam Constitutional:      Appearance: He is obese.  HENT:     Mouth/Throat:     Mouth: Mucous membranes are moist.     Pharynx: Oropharynx is clear.  Cardiovascular:     Rate and Rhythm: Normal rate and regular rhythm.  Pulmonary:     Effort: Pulmonary effort is normal.     Breath sounds: Normal breath sounds.  Skin:    General: Skin is warm and dry.  Neurological:     Mental Status: He is alert.  Psychiatric:  Mood and Affect: Mood normal.     Musculoskeletal Exam:  Neck with decreased left lateral rotation and decreased flexion range of motion with tenderness worse on the right side cervical paraspinal muscles Shoulder range of motion is intact pain provoked with overhead abduction and reaching behind the back but with normal strength Elbow full range of motion bilaterally mild tenderness to pressure with no swelling Bilateral wrist pain to palpation dorsally worst toward the radial side of the joint and exacerbated with wrist passive or active flexion Tenderness to pressure over the MCP joints bilaterally with no palpable synovitis, finger extension range of motion is slightly reduced, more significantly reduced in the left fourth and fifth digit which are passively held in a flexed position at rest Knee range of motion intact bilaterally with crepitus no palpable effusions Some atrophy of right distal leg muscles with use of AFO right foot is in a flexed position with great toe flexed and laterally deviated, left foot appears normal  Investigation: No additional  findings.  Imaging: DG Chest 2 View  Result Date: 01/12/2021 CLINICAL DATA:  Preop for cervical surgery. EXAM: CHEST - 2 VIEW COMPARISON:  June 07, 2020. FINDINGS: The heart size and mediastinal contours are within normal limits. Both lungs are clear. The visualized skeletal structures are unremarkable. IMPRESSION: No active cardiopulmonary disease. Electronically Signed   By: Marijo Conception M.D.   On: 01/12/2021 14:01   XR Hand 2 View Left  Result Date: 01/19/2021 X-ray left hand 2 views Radiocarpal joint space appears normal.  Degenerative arthritis with bone spurring at first [facility] joint.  MCP and PIP joint spaces appear normal.  Some probable joint space narrowing at second DIP.  No other osteophytes and no erosions are seen.  Bone mineralization appears normal. 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  Mild degenerative change at first [facility] joint and early osteophyte at base of first phalanx.  Normal-appearing MCP and PIP joint spaces.  Second third DIPs in slightly flexed position but no visible lateral osteophytes or erosions.  Bone mineralization appears normal. 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  Procedures:  No procedures performed Allergies: Pregabalin, Sulfa  antibiotics, and Gabapentin   Assessment / Plan:     Visit Diagnoses: Swelling of both hands - Plan: XR Hand 2 View Right, XR Hand 2 View Left, Sedimentation rate, Serum protein electrophoresis with reflex, Rheumatoid factor, Cyclic citrul peptide antibody, IgG  Bilateral hand pain swelling and stiffness worsening over several years a pretty extensive work-up around 2018 only findings of the disseminated Lyme infection at that time.  Check bilateral hand x-rays for any erosive changes.  Checking sedimentation rate, SPEP, rheumatoid factor, and CCP antibodies and  symptoms now sound more consistent with a chronic inflammatory arthritis.  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  Currently there is no clear guideline recommendation for antirheumatic drug treatment of a post Lyme disease treatment syndrome although symptoms may also be consistent with a chronic idiopathic urticarial skin rashes and seronegative arthritis.  History of pancreatitis Fatty liver Elevated LFTs  Liver enzyme elevations with a considerable variation over time for the past few years. No clear cause except for some fatty liver changes identified so far. May affect risks of treatment medication options.  Cervical radiculopathy Left-sided weakness  Some deficits, especially the left hand 4th-5th digit deficits are suspicious for a higher level nerve problem possible from cervical spine impingement. No cubital tunnel impingement on NCS.  Chronic bilateral low back pain, unspecified whether sciatica present - Plan: HLA-B27 antigen  Symptoms could be consistent with a spondyloarthritis process, although he had prior SI joint fusions after major accident and injury years ago  confounding low back pain issues. Checking HLA-B27 allele today also.  Orders: Orders Placed This Encounter  Procedures   XR Hand 2 View Right   XR Hand 2 View Left   Sedimentation rate   HLA-B27 antigen   Serum protein electrophoresis with reflex   Rheumatoid factor   Cyclic citrul peptide antibody, IgG    No orders of the defined types were placed in this encounter.    Follow-Up Instructions: Return in about 2 weeks (around 02/02/2021) for New pt ?arthritis/LymeHx f/u 2wks.   Collier Salina, MD  Note - This record has been created using [employer].  Chart creation errors have been sought, but may not always  have been located. Such creation errors do not reflect on  the standard of medical care.

## 2021-01-18 NOTE — Telephone Encounter (Signed)
Called [place] PA to discuss possibly postponing surgery for further work up if desired. He is having spinal surgery and is a lower bleeding risk surgery. He is not on any anticoagulation or antiplatelet therapy. Has not prior history of hematological issues. PT and INR wnl. Has history of pancreatitis, PA attempted LFT add on but unsuccessful. Most recent AST/ALT wnl. She stated the surgeon has decided to repeat PTT day of surgery and if stable will proceed with surgery w/ precautions. Gupta score is 0.  Lavonda Jumbo, DO 01/18/2021, 9:34 AM PGY-3, [facility]

## 2021-01-18 NOTE — Telephone Encounter (Signed)
Patient calls nurse line requesting advice on how he should proceed prior to surgical procedure on 8/24.  Veronda Prude, RN

## 2021-01-19 ENCOUNTER — Ambulatory Visit: Payer: 59 | Admitting: Internal Medicine

## 2021-01-19 ENCOUNTER — Ambulatory Visit: Payer: Self-pay

## 2021-01-19 ENCOUNTER — Telehealth: Payer: Self-pay | Admitting: Family Medicine

## 2021-01-19 ENCOUNTER — Encounter: Payer: Self-pay | Admitting: Internal Medicine

## 2021-01-19 ENCOUNTER — Other Ambulatory Visit: Payer: Self-pay

## 2021-01-19 ENCOUNTER — Ambulatory Visit: Payer: Self-pay | Admitting: Orthopedic Surgery

## 2021-01-19 VITALS — BP 124/85 | HR 73 | Ht 70.0 in | Wt 241.0 lb

## 2021-01-19 DIAGNOSIS — M545 Low back pain, unspecified: Secondary | ICD-10-CM | POA: Insufficient documentation

## 2021-01-19 DIAGNOSIS — R6889 Other general symptoms and signs: Secondary | ICD-10-CM

## 2021-01-19 DIAGNOSIS — M7989 Other specified soft tissue disorders: Secondary | ICD-10-CM | POA: Diagnosis not present

## 2021-01-19 DIAGNOSIS — M25541 Pain in joints of right hand: Secondary | ICD-10-CM

## 2021-01-19 DIAGNOSIS — Z8719 Personal history of other diseases of the digestive system: Secondary | ICD-10-CM

## 2021-01-19 DIAGNOSIS — M25542 Pain in joints of left hand: Secondary | ICD-10-CM

## 2021-01-19 DIAGNOSIS — Z8619 Personal history of other infectious and parasitic diseases: Secondary | ICD-10-CM

## 2021-01-19 DIAGNOSIS — M5412 Radiculopathy, cervical region: Secondary | ICD-10-CM

## 2021-01-19 DIAGNOSIS — R7989 Other specified abnormal findings of blood chemistry: Secondary | ICD-10-CM

## 2021-01-19 DIAGNOSIS — K760 Fatty (change of) liver, not elsewhere classified: Secondary | ICD-10-CM

## 2021-01-19 DIAGNOSIS — R531 Weakness: Secondary | ICD-10-CM

## 2021-01-19 DIAGNOSIS — G8929 Other chronic pain: Secondary | ICD-10-CM

## 2021-01-19 NOTE — Telephone Encounter (Addendum)
Attempted to call [place] PA back. No answer. LVM asking for her to call back. I will attempt to speak with the patient.   Lavonda Jumbo, DO 01/19/2021, 4:33 PM PGY-3, [facility]

## 2021-01-19 NOTE — Telephone Encounter (Signed)
Marchelle Folks PA from [facility] LVM on nurse line requesting a call back from PCP today. Patients surgery is tomorrow and he spoke with her today stating "there have been some new developments." At this time Marchelle Folks is unsure if they are to proceed with scheduled procedure tomorrow.   [phone]

## 2021-01-19 NOTE — Telephone Encounter (Signed)
Spoke with Marchelle Folks PA from [facility]. She explained patient stated I wanted to postpone surgery for further work up due to his PTT. There was a miscommunication. I told the patient if he would like to postpone surgery for further workup he could. Plan is still to proceed with repeat PTT day of surgery, if stable proceed with precaution and/or TXA. If it is not stable they will re-evaluate and possibly postpone. She stated this was communicated with the patient. I will attempt to call him as well.   Lavonda Jumbo, DO 01/19/2021, 4:41 PM PGY-3, [facility]

## 2021-01-19 NOTE — Telephone Encounter (Signed)
Spoke with patient about surgery tomorrow. Reiterated what Marchelle Folks PA and I spoke about. (Appreciate previous phone note). He is ready to hurry up and get the surgery over with. He does not have any questions at this time.   Lavonda Jumbo, DO 01/19/2021, 4:45 PM PGY-3, [facility]

## 2021-01-20 ENCOUNTER — Encounter (HOSPITAL_COMMUNITY): Payer: Self-pay | Admitting: Orthopedic Surgery

## 2021-01-20 ENCOUNTER — Ambulatory Visit (HOSPITAL_COMMUNITY): Payer: 59 | Admitting: Anesthesiology

## 2021-01-20 ENCOUNTER — Ambulatory Visit (HOSPITAL_COMMUNITY): Payer: 59 | Admitting: Vascular Surgery

## 2021-01-20 ENCOUNTER — Other Ambulatory Visit: Payer: Self-pay

## 2021-01-20 ENCOUNTER — Ambulatory Visit (HOSPITAL_COMMUNITY): Payer: 59

## 2021-01-20 ENCOUNTER — Encounter (HOSPITAL_COMMUNITY)
Admission: RE | Disposition: A | Discharge status: Home / Self Care | Payer: Self-pay | Source: Home / Self Care | Attending: Orthopedic Surgery

## 2021-01-20 ENCOUNTER — Observation Stay (HOSPITAL_COMMUNITY)
Admission: RE | Admit: 2021-01-20 | Discharge: 2021-01-21 | Disposition: A | Discharge status: Home / Self Care | Payer: 59 | Attending: Orthopedic Surgery | Admitting: Orthopedic Surgery

## 2021-01-20 DIAGNOSIS — F17200 Nicotine dependence, unspecified, uncomplicated: Secondary | ICD-10-CM | POA: Diagnosis not present

## 2021-01-20 DIAGNOSIS — M5412 Radiculopathy, cervical region: Secondary | ICD-10-CM | POA: Diagnosis not present

## 2021-01-20 DIAGNOSIS — Z79899 Other long term (current) drug therapy: Secondary | ICD-10-CM | POA: Diagnosis not present

## 2021-01-20 DIAGNOSIS — Z419 Encounter for procedure for purposes other than remedying health state, unspecified: Secondary | ICD-10-CM

## 2021-01-20 DIAGNOSIS — Z7901 Long term (current) use of anticoagulants: Secondary | ICD-10-CM | POA: Insufficient documentation

## 2021-01-20 DIAGNOSIS — I10 Essential (primary) hypertension: Secondary | ICD-10-CM | POA: Diagnosis not present

## 2021-01-20 HISTORY — PX: ANTERIOR CERVICAL DECOMPRESSION/DISCECTOMY FUSION 4 LEVELS: SHX5556

## 2021-01-20 LAB — APTT: aPTT: 37 seconds — ABNORMAL HIGH (ref 24–36)

## 2021-01-20 SURGERY — ANTERIOR CERVICAL DECOMPRESSION/DISCECTOMY FUSION 4 LEVELS
Anesthesia: General

## 2021-01-20 MED ORDER — CEFAZOLIN SODIUM-DEXTROSE 2-4 GM/100ML-% IV SOLN
INTRAVENOUS | Status: AC
  Filled 2021-01-20: qty 100

## 2021-01-20 MED ORDER — ACETAMINOPHEN 10 MG/ML IV SOLN: 1000.0000 mg | Freq: Once | INTRAVENOUS | Status: DC | PRN

## 2021-01-20 MED ORDER — METHOCARBAMOL 500 MG PO TABS
500.0000 mg | ORAL_TABLET | Freq: Four times a day (QID) | ORAL | Status: DC | PRN
  Administered 2021-01-20 – 2021-01-21 (×2): 500 mg via ORAL
  Filled 2021-01-20 (×2): qty 1

## 2021-01-20 MED ORDER — MIDAZOLAM HCL 2 MG/2ML IJ SOLN
INTRAMUSCULAR | Status: AC
  Filled 2021-01-20: qty 2

## 2021-01-20 MED ORDER — HYDROMORPHONE HCL 1 MG/ML IJ SOLN
1.0000 mg | INTRAMUSCULAR | Status: DC | PRN
  Filled 2021-01-20: qty 1

## 2021-01-20 MED ORDER — OXYCODONE HCL 5 MG PO TABS: 5.0000 mg | ORAL_TABLET | Freq: Once | ORAL | Status: AC | PRN

## 2021-01-20 MED ORDER — FLEET ENEMA 7-19 GM/118ML RE ENEM: 1.0000 | ENEMA | Freq: Once | RECTAL | Status: DC | PRN

## 2021-01-20 MED ORDER — TRANEXAMIC ACID-NACL 1000-0.7 MG/100ML-% IV SOLN
INTRAVENOUS | Status: DC | PRN
  Administered 2021-01-20: 1000 mg via INTRAVENOUS

## 2021-01-20 MED ORDER — MENTHOL 3 MG MT LOZG: 1.0000 | LOZENGE | OROMUCOSAL | Status: DC | PRN

## 2021-01-20 MED ORDER — ACETAMINOPHEN 650 MG RE SUPP: 650.0000 mg | RECTAL | Status: DC | PRN

## 2021-01-20 MED ORDER — ORAL CARE MOUTH RINSE: 15.0000 mL | Freq: Once | OROMUCOSAL | Status: AC

## 2021-01-20 MED ORDER — SODIUM CHLORIDE 0.9% FLUSH: 3.0000 mL | Freq: Two times a day (BID) | INTRAVENOUS | Status: DC

## 2021-01-20 MED ORDER — THROMBIN 20000 UNITS EX SOLR
CUTANEOUS | Status: AC
  Filled 2021-01-20: qty 20000

## 2021-01-20 MED ORDER — LACTATED RINGERS IV SOLN: INTRAVENOUS | Status: DC | PRN

## 2021-01-20 MED ORDER — ALBUMIN HUMAN 5 % IV SOLN: INTRAVENOUS | Status: DC | PRN

## 2021-01-20 MED ORDER — AMISULPRIDE (ANTIEMETIC) 5 MG/2ML IV SOLN: 10.0000 mg | Freq: Once | INTRAVENOUS | Status: DC | PRN

## 2021-01-20 MED ORDER — OXYCODONE HCL 5 MG PO TABS
10.0000 mg | ORAL_TABLET | ORAL | Status: DC | PRN
  Administered 2021-01-20 – 2021-01-21 (×4): 10 mg via ORAL
  Filled 2021-01-20 (×4): qty 2

## 2021-01-20 MED ORDER — LIDOCAINE 2% (20 MG/ML) 5 ML SYRINGE
INTRAMUSCULAR | Status: AC
  Filled 2021-01-20: qty 5

## 2021-01-20 MED ORDER — CEFAZOLIN SODIUM-DEXTROSE 1-4 GM/50ML-% IV SOLN
1.0000 g | Freq: Three times a day (TID) | INTRAVENOUS | Status: AC
  Administered 2021-01-20 (×2): 1 g via INTRAVENOUS
  Filled 2021-01-20 (×2): qty 50

## 2021-01-20 MED ORDER — LACTATED RINGERS IV SOLN: INTRAVENOUS | Status: DC

## 2021-01-20 MED ORDER — FLUTICASONE PROPIONATE 50 MCG/ACT NA SUSP
2.0000 | Freq: Every day | NASAL | Status: DC
  Filled 2021-01-20: qty 16

## 2021-01-20 MED ORDER — OXYCODONE HCL 5 MG PO TABS
ORAL_TABLET | ORAL | Status: AC
  Administered 2021-01-20: 5 mg via ORAL
  Filled 2021-01-20: qty 1

## 2021-01-20 MED ORDER — CHLORHEXIDINE GLUCONATE 0.12 % MT SOLN
OROMUCOSAL | Status: AC
  Administered 2021-01-20: 15 mL via OROMUCOSAL
  Filled 2021-01-20: qty 15

## 2021-01-20 MED ORDER — ROCURONIUM BROMIDE 10 MG/ML (PF) SYRINGE
PREFILLED_SYRINGE | INTRAVENOUS | Status: DC | PRN
Start: 2021-01-20 — End: 2021-01-20
  Administered 2021-01-20: 20 mg via INTRAVENOUS
  Administered 2021-01-20: 30 mg via INTRAVENOUS
  Administered 2021-01-20: 80 mg via INTRAVENOUS
  Administered 2021-01-20: 30 mg via INTRAVENOUS

## 2021-01-20 MED ORDER — ONDANSETRON HCL 4 MG/2ML IJ SOLN: 4.0000 mg | Freq: Four times a day (QID) | INTRAMUSCULAR | Status: DC | PRN

## 2021-01-20 MED ORDER — ROCURONIUM BROMIDE 10 MG/ML (PF) SYRINGE
PREFILLED_SYRINGE | INTRAVENOUS | Status: AC
  Filled 2021-01-20: qty 20

## 2021-01-20 MED ORDER — LIDOCAINE 2% (20 MG/ML) 5 ML SYRINGE
INTRAMUSCULAR | Status: DC | PRN
  Administered 2021-01-20: 100 mg via INTRAVENOUS

## 2021-01-20 MED ORDER — METHOCARBAMOL 500 MG PO TABS
ORAL_TABLET | ORAL | Status: AC
  Administered 2021-01-20: 500 mg via ORAL
  Filled 2021-01-20: qty 1

## 2021-01-20 MED ORDER — PROPOFOL 10 MG/ML IV BOLUS
INTRAVENOUS | Status: AC
  Filled 2021-01-20: qty 20

## 2021-01-20 MED ORDER — THROMBIN 20000 UNITS EX SOLR: CUTANEOUS | Status: DC | PRN

## 2021-01-20 MED ORDER — ONDANSETRON HCL 4 MG PO TABS: 4.0000 mg | ORAL_TABLET | Freq: Three times a day (TID) | ORAL | 0 refills | Status: DC | PRN

## 2021-01-20 MED ORDER — DEXMEDETOMIDINE HCL IN NACL 200 MCG/50ML IV SOLN
INTRAVENOUS | Status: AC
  Filled 2021-01-20: qty 50

## 2021-01-20 MED ORDER — ONDANSETRON HCL 4 MG/2ML IJ SOLN: 4.0000 mg | Freq: Once | INTRAMUSCULAR | Status: DC | PRN

## 2021-01-20 MED ORDER — ONDANSETRON HCL 4 MG/2ML IJ SOLN
INTRAMUSCULAR | Status: AC
  Filled 2021-01-20: qty 2

## 2021-01-20 MED ORDER — SODIUM CHLORIDE 0.9 % IV SOLN: 250.0000 mL | INTRAVENOUS | Status: DC

## 2021-01-20 MED ORDER — OXYCODONE HCL 5 MG PO TABS
5.0000 mg | ORAL_TABLET | ORAL | Status: DC | PRN
Start: 2021-01-20 — End: 2021-01-21

## 2021-01-20 MED ORDER — CHLORHEXIDINE GLUCONATE 0.12 % MT SOLN: 15.0000 mL | Freq: Once | OROMUCOSAL | Status: AC

## 2021-01-20 MED ORDER — SODIUM CHLORIDE 0.9% FLUSH: 3.0000 mL | INTRAVENOUS | Status: DC | PRN

## 2021-01-20 MED ORDER — ACETAMINOPHEN 10 MG/ML IV SOLN
INTRAVENOUS | Status: AC
  Filled 2021-01-20: qty 100

## 2021-01-20 MED ORDER — ACETAMINOPHEN 10 MG/ML IV SOLN
INTRAVENOUS | Status: DC | PRN
  Administered 2021-01-20: 1000 mg via INTRAVENOUS

## 2021-01-20 MED ORDER — ACETAMINOPHEN 325 MG PO TABS
650.0000 mg | ORAL_TABLET | ORAL | Status: DC | PRN
  Administered 2021-01-20: 650 mg via ORAL
  Filled 2021-01-20: qty 2

## 2021-01-20 MED ORDER — METHOCARBAMOL 1000 MG/10ML IJ SOLN
500.0000 mg | Freq: Four times a day (QID) | INTRAVENOUS | Status: DC | PRN
  Filled 2021-01-20: qty 5

## 2021-01-20 MED ORDER — PHENOL 1.4 % MT LIQD: 1.0000 | OROMUCOSAL | Status: DC | PRN

## 2021-01-20 MED ORDER — TRANEXAMIC ACID-NACL 1000-0.7 MG/100ML-% IV SOLN
INTRAVENOUS | Status: AC
  Filled 2021-01-20: qty 100

## 2021-01-20 MED ORDER — 0.9 % SODIUM CHLORIDE (POUR BTL) OPTIME
TOPICAL | Status: DC | PRN
  Administered 2021-01-20: 1000 mL

## 2021-01-20 MED ORDER — OXYCODONE-ACETAMINOPHEN 10-325 MG PO TABS: 1.0000 | ORAL_TABLET | Freq: Four times a day (QID) | ORAL | 0 refills | Status: AC | PRN

## 2021-01-20 MED ORDER — METHOCARBAMOL 500 MG PO TABS: 500.0000 mg | ORAL_TABLET | Freq: Three times a day (TID) | ORAL | 0 refills | Status: AC | PRN

## 2021-01-20 MED ORDER — SCOPOLAMINE 1 MG/3DAYS TD PT72
MEDICATED_PATCH | TRANSDERMAL | Status: AC
  Filled 2021-01-20: qty 1

## 2021-01-20 MED ORDER — BUPIVACAINE-EPINEPHRINE 0.25% -1:200000 IJ SOLN
INTRAMUSCULAR | Status: DC | PRN
  Administered 2021-01-20: 8 mL

## 2021-01-20 MED ORDER — SCOPOLAMINE 1 MG/3DAYS TD PT72
MEDICATED_PATCH | TRANSDERMAL | Status: DC | PRN
  Administered 2021-01-20: 1 via TRANSDERMAL

## 2021-01-20 MED ORDER — HYDROMORPHONE HCL 1 MG/ML IJ SOLN
INTRAMUSCULAR | Status: AC
  Administered 2021-01-20: 0.5 mg via INTRAVENOUS
  Filled 2021-01-20: qty 1

## 2021-01-20 MED ORDER — OXYCODONE HCL 5 MG/5ML PO SOLN: 5.0000 mg | Freq: Once | ORAL | Status: AC | PRN

## 2021-01-20 MED ORDER — DEXMEDETOMIDINE HCL IN NACL 200 MCG/50ML IV SOLN
INTRAVENOUS | Status: DC | PRN
  Administered 2021-01-20: 20 ug via INTRAVENOUS
  Administered 2021-01-20 (×4): 8 ug via INTRAVENOUS
  Administered 2021-01-20: 12 ug via INTRAVENOUS

## 2021-01-20 MED ORDER — DEXAMETHASONE SODIUM PHOSPHATE 4 MG/ML IJ SOLN
4.0000 mg | Freq: Four times a day (QID) | INTRAMUSCULAR | Status: DC
  Administered 2021-01-20 – 2021-01-21 (×2): 4 mg via INTRAVENOUS
  Filled 2021-01-20 (×2): qty 1

## 2021-01-20 MED ORDER — HYDROMORPHONE HCL 1 MG/ML IJ SOLN: 0.2500 mg | INTRAMUSCULAR | Status: DC | PRN

## 2021-01-20 MED ORDER — PROPOFOL 10 MG/ML IV BOLUS
INTRAVENOUS | Status: DC | PRN
Start: 2021-01-20 — End: 2021-01-20
  Administered 2021-01-20: 50 mg via INTRAVENOUS
  Administered 2021-01-20: 200 mg via INTRAVENOUS

## 2021-01-20 MED ORDER — IRBESARTAN 150 MG PO TABS
75.0000 mg | ORAL_TABLET | Freq: Every day | ORAL | Status: DC
  Administered 2021-01-20: 75 mg via ORAL
  Filled 2021-01-20: qty 1

## 2021-01-20 MED ORDER — DEXAMETHASONE 4 MG PO TABS
4.0000 mg | ORAL_TABLET | Freq: Four times a day (QID) | ORAL | Status: DC
  Administered 2021-01-20: 4 mg via ORAL
  Filled 2021-01-20: qty 1

## 2021-01-20 MED ORDER — CEFAZOLIN SODIUM-DEXTROSE 2-4 GM/100ML-% IV SOLN
2.0000 g | INTRAVENOUS | Status: AC
  Administered 2021-01-20 (×2): 2 g via INTRAVENOUS

## 2021-01-20 MED ORDER — FENTANYL CITRATE (PF) 250 MCG/5ML IJ SOLN
INTRAMUSCULAR | Status: DC | PRN
  Administered 2021-01-20: 150 ug via INTRAVENOUS
  Administered 2021-01-20: 100 ug via INTRAVENOUS

## 2021-01-20 MED ORDER — SUGAMMADEX SODIUM 200 MG/2ML IV SOLN
INTRAVENOUS | Status: DC | PRN
  Administered 2021-01-20: 200 mg via INTRAVENOUS

## 2021-01-20 MED ORDER — BUPIVACAINE-EPINEPHRINE (PF) 0.25% -1:200000 IJ SOLN
INTRAMUSCULAR | Status: AC
  Filled 2021-01-20: qty 30

## 2021-01-20 MED ORDER — GABAPENTIN 300 MG PO CAPS
300.0000 mg | ORAL_CAPSULE | Freq: Three times a day (TID) | ORAL | Status: DC
  Administered 2021-01-20 (×2): 300 mg via ORAL
  Filled 2021-01-20 (×2): qty 1

## 2021-01-20 MED ORDER — POLYETHYLENE GLYCOL 3350 17 G PO PACK: 17.0000 g | PACK | Freq: Every day | ORAL | Status: DC | PRN

## 2021-01-20 MED ORDER — MIDAZOLAM HCL 2 MG/2ML IJ SOLN
INTRAMUSCULAR | Status: DC | PRN
  Administered 2021-01-20: 2 mg via INTRAVENOUS

## 2021-01-20 MED ORDER — PHENYLEPHRINE HCL-NACL 20-0.9 MG/250ML-% IV SOLN
INTRAVENOUS | Status: DC | PRN
  Administered 2021-01-20: 25 ug/min via INTRAVENOUS

## 2021-01-20 MED ORDER — ONDANSETRON HCL 4 MG PO TABS: 4.0000 mg | ORAL_TABLET | Freq: Four times a day (QID) | ORAL | Status: DC | PRN

## 2021-01-20 MED ORDER — ONDANSETRON HCL 4 MG/2ML IJ SOLN
INTRAMUSCULAR | Status: DC | PRN
  Administered 2021-01-20: 4 mg via INTRAVENOUS

## 2021-01-20 MED ORDER — DOCUSATE SODIUM 100 MG PO CAPS
100.0000 mg | ORAL_CAPSULE | Freq: Two times a day (BID) | ORAL | Status: DC
  Administered 2021-01-20 (×2): 100 mg via ORAL
  Filled 2021-01-20 (×2): qty 1

## 2021-01-20 MED ORDER — FENTANYL CITRATE (PF) 250 MCG/5ML IJ SOLN
INTRAMUSCULAR | Status: AC
  Filled 2021-01-20: qty 5

## 2021-01-20 SURGICAL SUPPLY — 77 items
ADH SKN CLS APL DERMABOND .7 (GAUZE/BANDAGES/DRESSINGS) ×1
AGENT HMST KT MTR STRL THRMB (HEMOSTASIS) ×2
BAG COUNTER SPONGE SURGICOUNT (BAG) ×2 IMPLANT
BAG SPNG CNTER NS LX DISP (BAG) ×1
BAND INSRT 18 STRL LF DISP RB (MISCELLANEOUS)
BAND RUBBER #18 3X1/16 STRL (MISCELLANEOUS) IMPLANT
BLADE CLIPPER SURG (BLADE) IMPLANT
BONE MATRIX OSTEOCEL PRO SM (Bone Implant) ×3 IMPLANT
BUR EGG ELITE 4.0 (BURR) IMPLANT
BUR MATCHSTICK NEURO 3.0 LAGG (BURR) IMPLANT
CABLE BIPOLOR RESECTION CORD (MISCELLANEOUS) ×2 IMPLANT
CANISTER SUCT 3000ML PPV (MISCELLANEOUS) ×2 IMPLANT
CLSR STERI-STRIP ANTIMIC 1/2X4 (GAUZE/BANDAGES/DRESSINGS) ×2 IMPLANT
COLLAR CERV LO CONTOUR FIRM DE (SOFTGOODS) IMPLANT
COVER MAYO STAND STRL (DRAPES) ×6 IMPLANT
COVER SURGICAL LIGHT HANDLE (MISCELLANEOUS) ×4 IMPLANT
DERMABOND ADVANCED (GAUZE/BANDAGES/DRESSINGS) ×1
DERMABOND ADVANCED .7 DNX12 (GAUZE/BANDAGES/DRESSINGS) ×1 IMPLANT
DEVICE EDNSKLTN TC NNLCK MED 8 (Cage) IMPLANT
DEVICE ENDSKLTN IMPL 16X14X7X6 (Cage) IMPLANT
DRAIN TLS ROUND 10FR (DRAIN) ×1 IMPLANT
DRAPE C-ARM 42X72 X-RAY (DRAPES) ×2 IMPLANT
DRAPE MICROSCOPE LEICA 46X105 (MISCELLANEOUS) IMPLANT
DRAPE POUCH INSTRU U-SHP 10X18 (DRAPES) ×2 IMPLANT
DRAPE SURG 17X23 STRL (DRAPES) ×2 IMPLANT
DRAPE U-SHAPE 47X51 STRL (DRAPES) ×2 IMPLANT
DRSG OPSITE POSTOP 4X6 (GAUZE/BANDAGES/DRESSINGS) ×2 IMPLANT
DURAPREP 26ML APPLICATOR (WOUND CARE) ×2 IMPLANT
ELECT COATED BLADE 2.86 ST (ELECTRODE) ×2 IMPLANT
ELECT PENCIL ROCKER SW 15FT (MISCELLANEOUS) ×2 IMPLANT
ELECT REM PT RETURN 9FT ADLT (ELECTROSURGICAL) ×2
ELECTRODE REM PT RTRN 9FT ADLT (ELECTROSURGICAL) ×1 IMPLANT
ENDOSKELETON IMPLANT 16X14X7X6 (Cage) ×4 IMPLANT
ENDOSKELTON IMPLANT TC MED 8MM (Cage) ×4 IMPLANT
GLOVE SURG ENC MOIS LTX SZ6.5 (GLOVE) ×2 IMPLANT
GLOVE SURG MICRO LTX SZ8.5 (GLOVE) ×2 IMPLANT
GLOVE SURG UNDER POLY LF SZ6.5 (GLOVE) ×2 IMPLANT
GLOVE SURG UNDER POLY LF SZ8.5 (GLOVE) ×2 IMPLANT
GOWN STRL REUS W/ TWL LRG LVL3 (GOWN DISPOSABLE) ×2 IMPLANT
GOWN STRL REUS W/TWL 2XL LVL3 (GOWN DISPOSABLE) ×4 IMPLANT
GOWN STRL REUS W/TWL LRG LVL3 (GOWN DISPOSABLE) ×4
KIT BASIN OR (CUSTOM PROCEDURE TRAY) ×2 IMPLANT
KIT TURNOVER KIT B (KITS) ×2 IMPLANT
NDL SPNL 18GX3.5 QUINCKE PK (NEEDLE) ×1 IMPLANT
NEEDLE HYPO 22GX1.5 SAFETY (NEEDLE) ×2 IMPLANT
NEEDLE SPNL 18GX3.5 QUINCKE PK (NEEDLE) ×2 IMPLANT
NS IRRIG 1000ML POUR BTL (IV SOLUTION) ×2 IMPLANT
PACK ORTHO CERVICAL (CUSTOM PROCEDURE TRAY) ×2 IMPLANT
PACK UNIVERSAL I (CUSTOM PROCEDURE TRAY) ×2 IMPLANT
PAD ARMBOARD 7.5X6 YLW CONV (MISCELLANEOUS) ×4 IMPLANT
PATTIES SURGICAL .25X.25 (GAUZE/BANDAGES/DRESSINGS) IMPLANT
PATTIES SURGICAL .5 X.5 (GAUZE/BANDAGES/DRESSINGS) IMPLANT
PIN ACP TEMP FIXATION (EXFIX) ×2 IMPLANT
PIN DISTRACTION MAXCESS-C 14 (PIN) ×3 IMPLANT
PLATE ACP 1.9X58 3LVL (Plate) ×1 IMPLANT
POSITIONER HEAD DONUT 9IN (MISCELLANEOUS) ×2 IMPLANT
SCREW ACP 3.5X17 S/D VARIA (Screw) ×2 IMPLANT
SCREW ACP VA SD 3.5X15 (Screw) ×6 IMPLANT
SPONGE INTESTINAL PEANUT (DISPOSABLE) ×2 IMPLANT
SPONGE SURGIFOAM ABS GEL 100 (HEMOSTASIS) ×2 IMPLANT
SPONGE T-LAP 4X18 ~~LOC~~+RFID (SPONGE) IMPLANT
SURGIFLO W/THROMBIN 8M KIT (HEMOSTASIS) ×2 IMPLANT
SUT BONE WAX W31G (SUTURE) ×2 IMPLANT
SUT MNCRL AB 3-0 PS2 27 (SUTURE) ×3 IMPLANT
SUT SILK 2 0 (SUTURE) ×2
SUT SILK 2-0 18XBRD TIE 12 (SUTURE) ×1 IMPLANT
SUT VIC AB 2-0 CT1 18 (SUTURE) ×2 IMPLANT
SUT VIC AB 3-0 54X BRD REEL (SUTURE) IMPLANT
SUT VIC AB 3-0 BRD 54 (SUTURE)
SYR BULB IRRIG 60ML STRL (SYRINGE) ×2 IMPLANT
SYR CONTROL 10ML LL (SYRINGE) ×2 IMPLANT
TAPE CLOTH 4X10 WHT NS (GAUZE/BANDAGES/DRESSINGS) ×2 IMPLANT
TAPE UMBILICAL COTTON 1/8X30 (MISCELLANEOUS) ×2 IMPLANT
TOWEL GREEN STERILE (TOWEL DISPOSABLE) ×2 IMPLANT
TOWEL GREEN STERILE FF (TOWEL DISPOSABLE) ×2 IMPLANT
TRAY FOLEY MTR SLVR 16FR STAT (SET/KITS/TRAYS/PACK) IMPLANT
WATER STERILE IRR 1000ML POUR (IV SOLUTION) ×2 IMPLANT

## 2021-01-20 NOTE — Discharge Instructions (Signed)

## 2021-01-20 NOTE — Anesthesia Postprocedure Evaluation (Signed)
Anesthesia Post Note  Patient: [name]  Procedure(s) Performed: ANTERIOR CERVICAL DECOMPRESSION/DISCECTOMY FUSION 3  LEVELS C5-T1     Patient location during evaluation: PACU Anesthesia Type: General Level of consciousness: awake and alert Pain management: pain level controlled Vital Signs Assessment: post-procedure vital signs reviewed and stable Respiratory status: spontaneous breathing, nonlabored ventilation, respiratory function stable and patient connected to nasal cannula oxygen Cardiovascular status: blood pressure returned to baseline and stable Postop Assessment: no apparent nausea or vomiting Anesthetic complications: no   No notable events documented.  Last Vitals:  Vitals:   01/20/21 1438 01/20/21 1438  BP: (!) 160/92   Pulse: 71 68  Resp:  20  Temp:  36.5 C  SpO2: 97% 98%    Last Pain:  Vitals:   01/20/21 1440  TempSrc:   PainSc: 3       LLE Sensation: Full sensation (01/20/21 1440)   RLE Sensation: Full sensation (01/20/21 1440)      Trevor Iha

## 2021-01-20 NOTE — H&P (Signed)
Addendum H&P  Patient continues to have significant neck and radicular left arm pain.  His clinical exam is unchanged from his last office visit of 01/12/2021.  Surgical plan is a 3 level ACDF C5-T1.  I have gone over the risks, benefits, and alternatives to surgery and the patient is expressed an understanding of these risks as well as a willingness to move forward with surgery.

## 2021-01-20 NOTE — Anesthesia Procedure Notes (Signed)
Procedure Name: Intubation Date/Time: 01/20/2021 7:53 AM Performed by: Michele Rockers, CRNA Pre-anesthesia Checklist: Patient identified, Patient being monitored, Timeout performed, Emergency Drugs available and Suction available Patient Re-evaluated:Patient Re-evaluated prior to induction Oxygen Delivery Method: Circle System Utilized Preoxygenation: Pre-oxygenation with 100% oxygen Induction Type: IV induction Ventilation: Mask ventilation without difficulty and Oral airway inserted - appropriate to patient size Laryngoscope Size: 2, Mac, 3 and Glidescope Grade View: Grade I Tube type: Oral Tube size: 8.0 mm Number of attempts: 1 Airway Equipment and Method: Stylet Placement Confirmation: ETT inserted through vocal cords under direct vision, positive ETCO2 and breath sounds checked- equal and bilateral Secured at: 24 cm Tube secured with: Tape Dental Injury: Teeth and Oropharynx as per pre-operative assessment

## 2021-01-20 NOTE — Brief Op Note (Signed)
01/20/2021  11:57 AM  PATIENT:  [name]  50 y.o. male  PRE-OPERATIVE DIAGNOSIS:  Cervical spondylotic radiculopathy  POST-OPERATIVE DIAGNOSIS:  Cervical spondylotic radiculopathy  PROCEDURE:  Procedure(s): ANTERIOR CERVICAL DECOMPRESSION/DISCECTOMY FUSION 3  LEVELS C5-T1 (N/A)  SURGEON:  Surgeon(s) and Role:    Venita Lick, MD - Primary  PHYSICIAN ASSISTANT:   ASSISTANTS: Voncille Lo, PA  ANESTHESIA:   general  EBL:  50 mL   BLOOD ADMINISTERED:none  DRAINS: (1) TLS Drain(s) to suction in the cervical spine    LOCAL MEDICATIONS USED:  MARCAINE     SPECIMEN:  No Specimen  DISPOSITION OF SPECIMEN:  N/A  COUNTS:  YES  TOURNIQUET:  * No tourniquets in log *  DICTATION: .Dragon Dictation  PLAN OF CARE: Admit for overnight observation  PATIENT DISPOSITION:  PACU - hemodynamically stable.

## 2021-01-20 NOTE — Op Note (Signed)
OPERATIVE REPORT  DATE OF SURGERY: 01/20/2021  PATIENT NAME:  [name] MRN: [id number] DOB: June 12, 1970  PCP: Lavonda Jumbo, DO  PRE-OPERATIVE DIAGNOSIS: Cervical spondylitic radiculopathy C5-T1  POST-OPERATIVE DIAGNOSIS: Same  PROCEDURE:   ACDF C5-T1  SURGEON:  Venita Lick, MD  PHYSICIAN ASSISTANT: Voncille Lo, PA  ANESTHESIA:   General  EBL: 50 ml   Complications: None  Implants: Titan intervertebral lordotic cage.  C5-6: 8 mm medium.  C5-6 through T1: 7 mm medium. NuVasive ACP cervical plate.  58 mm length.  52mm locking screws into the body of C5, 15 mm locking screws in the remainder of the levels.  Graft: osteocell  BRIEF HISTORY: [name] is a 50 y.o. male who resents to my office with complaints of significant neck and radicular left arm pain.  Patient's predominant pain was in the left C8 dermatome.  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  Attempts at conservative management had failed to alleviate his symptoms so he elected to move forward with surgery.  All appropriate risks, benefits, and alternatives were discussed with the patient and consent was obtained.  PROCEDURE DETAILS: Patient was brought into the operating room and was properly positioned on the operating room table, and teds and SCDs were applied.  After induction with general anesthesia the patient was endotracheally intubated.  A timeout was taken to confirm all important data: Including patient, procedure, and the level.  Anterior cervical spine was prepped and draped in a standard fashion.  Using fluoroscopy identified the C5 and T1 vertebral bodies to mark out my incision site.  I elected to use a longitudinal left-sided standard Smith-Robinson approach to the cervical spine.  I infiltrated the incision with quarter percent Marcaine with epinephrine and then made a longitudinal  incision in line with the sternocleidomastoid muscle.  Sharp dissection was carried out down to and through the platysma.  I then began sharply dissecting into the deep cervical fascia making sure to keep the trachea and esophagus to the right.  I ultimately identified and sacrificed the omohyoid muscle in order to improve visualization.  Leash of vessels inferiorly was identified isolated and resected in order to allow for visualization at the C7-T1 level.  At this point a needle was placed into the C5-6 disc space and an x-ray was taken confirming I was at the appropriate level.  This disc space was marked with the Bovie.  I then mobilized the longus coli muscle from the superior portion of C5 down to the inferior aspect of T1.  The trachea and esophagus were clearly identified and protected with hand-held retractors throughout this process.  Once I had the C5-T1 disc space exposed I placed the Caspar retracting blades to expose the C7-T1 level.  I deflated the endotracheal cuff and expanded the retractor to the appropriate width.  The anterior exostosis/bone spurs were removed to better visualize the disc space.  An anterior annulotomy was then performed with a 15 blade scalpel and I used pituitary rongeurs to remove the bulk of the disc material.  I then used a 2 mm, and 3 mm Kerrison rongeur to resect the overhanging osteophyte from the inferior aspect of the C7 vertebral body.  Caspar retracting pins were then placed into the body of see C7 and T1 and I distracted the intervertebral space with a lamina spreader and maintained the distraction with the pin distractor set.  I then used curettes  to remove the remaining disc material and expose the posterior annulus.  I made sure to remove all of the cartilaginous endplate so I had bleeding subchondral bone exposed.  Using a fine nerve hook I gently dissected through the posterior annulus and then resected this with a 1 mm Kerrison rongeur.  I now to resect the  osteophyte from the posterior aspect of the C7 and T1 vertebral bodies.  I then used my nerve hook to gently dissect through the posterior longitudinal ligament and then create the plane between the thecal sac and the PLL.  Using a 1 mm Kerrison rongeur I remove the PLL.  This allowed me to undercut under the uncovertebral joint to further decompress.  At this point using fluoroscopy I confirmed parallel endplate distraction as well as decompression wire to easily pass my nerve hook behind the vertebral body of C7 and T1 and under the uncovertebral joint.  With the discectomy/decompression complete I then used my trial implants to determine the best size.  The 7 mm medium lordotic cage provided the best overall fit.  At this point the cage was packed with the appropriate amount of bone graft and malleted to the appropriate depth.  The distraction pin from T1 was then removed and the resulting bleeding into the bone was sealed with bone wax.  I then repositioned my retractors to expose the C5-6 level.  The same technique that I used at C7-T1 I performed an annulotomy and removed all of the disc material at C5-6.  Distraction pins were then placed into the body of C5 and C6 and I gently distracted the space.  Using curettes and Kerrison rongeurs I removed all the remaining disc material.  I again used my 1 mm Kerrison rongeur to remove the posterior osteophyte from the vertebral bodies of C5 and C6.  I continued to dissect through the posterior annulus and remove this to adequately decompress the level.  I was able to undercut the uncovertebral joint.  At this point I was pleased with the overall decompression/discectomy.  I then trialed with the rasp intervertebral spacers and elected to use an 8 mm medium lordotic cage.  This provided the best overall fit.  The cage was obtained and packed with the allograft and then malleted to the appropriate depth.  Distraction pins were removed from the body of C5 and the  resulting hole was sealed with bone wax.  The C6-7 disc space was now visualized and using the same technique I performed a discectomy at this level.  Annulotomy was performed 15 blade scalpel and I remove the overhanging osteophyte from the inferior aspect of C6 with a Kerrison rongeur.  I removed all the disc material and continue to remove the posterior osteophyte with a 1 mm Kerrison rongeur.  I undercut the uncovertebral joints and I confirmed parallel endplate distraction with live fluoroscopy.  At this point with the discectomy at C6-7 completed I measured with the intervertebral trials and elected with a 7 mm lordotic spacer the cage was packed with allograft and malleted to the appropriate depth.  At this point time all distraction pins were removed and I irrigated the wound copiously with normal saline.  I then contoured a 58 mm length cervical plate so there was increased cervical lordosis.  I then secured it directly to the vertebral body of C5 with 17 mm screws and T1 with 15 mm length screws.  Care was taken to ensure the esophagus was not entrapped beneath the plate.  Once the caudal and cephalad fixation was complete I then secured the C6 and C7 vertebral bodies with 15 mm locking screws.  All screws were tightened and noted to have adequate purchase.  Imaging demonstrated satisfactory position of the plate and screws in the lateral plane.  I also was pleased with the restoration of his cervical lordosis.  At this point the locking mechanism was engaged per manufacture standards to prevent backout of the screws.  The wound was then copiously irrigated and I remove the retracting devices.  I then placed Floseal into the wound to aid in postoperative hemostasis.  Final images were taken which demonstrate satisfactory position of the plate and intervertebral cages in both the AP and lateral planes.  A drain was placed in the wound and taken out of a separate stab incision.  The wound was then  copiously irrigated again and hemostasis was noted.  I then returned the trachea esophagus to midline after 1 final check to ensure the esophagus was not entrapped beneath the plate.  I then closed the platysma with interrupted 2-0 Vicryl suture, and a 3-0 Monocryl for the skin.  Steri-Strips and a dry dressing were applied and the patient was ultimately extubated transfer the PACU without incident.  At the end of the case all needle and sponge counts were correct.  There were no adverse intraoperative events.    Venita Lick, MD 01/20/2021 11:42 AM

## 2021-01-20 NOTE — Transfer of Care (Signed)
Immediate Anesthesia Transfer of Care Note  Patient: [name]  Procedure(s) Performed: ANTERIOR CERVICAL DECOMPRESSION/DISCECTOMY FUSION 3  LEVELS C5-T1  Patient Location: PACU  Anesthesia Type:General  Level of Consciousness: drowsy, patient cooperative and responds to stimulation  Airway & Oxygen Therapy: Patient Spontanous Breathing and Patient connected to face mask oxygen  Post-op Assessment: Report given to RN, Post -op Vital signs reviewed and stable and Patient moving all extremities X 4  Post vital signs: Reviewed and stable  Last Vitals:  Vitals Value Taken Time  BP 121/81 01/20/21 1214  Temp    Pulse 88 01/20/21 1214  Resp 16 01/20/21 1214  SpO2 99 % 01/20/21 1214  Vitals shown include unvalidated device data.  Last Pain:  Vitals:   01/20/21 0638  TempSrc:   PainSc: 5       Patients Stated Pain Goal: 3 (01/20/21 9381)  Complications: No notable events documented.

## 2021-01-21 DIAGNOSIS — M5412 Radiculopathy, cervical region: Secondary | ICD-10-CM | POA: Diagnosis not present

## 2021-01-21 LAB — PROTEIN ELECTROPHORESIS, SERUM, WITH REFLEX
Albumin ELP: 4.3 g/dL (ref 3.8–4.8)
Alpha 1: 0.3 g/dL (ref 0.2–0.3)
Alpha 2: 0.7 g/dL (ref 0.5–0.9)
Beta 2: 0.3 g/dL (ref 0.2–0.5)
Beta Globulin: 0.4 g/dL (ref 0.4–0.6)
Gamma Globulin: 0.9 g/dL (ref 0.8–1.7)
Total Protein: 6.9 g/dL (ref 6.1–8.1)

## 2021-01-21 LAB — RHEUMATOID FACTOR: Rheumatoid fact SerPl-aCnc: <14 IU/mL (ref <14)

## 2021-01-21 LAB — SEDIMENTATION RATE: Sed Rate: 6 mm/h (ref 0–15)

## 2021-01-21 LAB — HLA-B27 ANTIGEN: HLA-B27 Antigen: NEGATIVE

## 2021-01-21 LAB — CYCLIC CITRUL PEPTIDE ANTIBODY, IGG: Cyclic Citrullin Peptide Ab: <16 UNITS

## 2021-01-21 NOTE — Plan of Care (Signed)
Pt doing well. Pt given D/C instructions with Rx's, verbal understanding was provided. Pt's incision dressing was changed prior to D/C. Pt's IV and drain were removed prior to D/C per MD order. Pt D/C'd home via wheelchair per MD order. Pt is stable @ D/C and has no other needs at this time. Rema Fendt, RN

## 2021-01-21 NOTE — Evaluation (Signed)
Occupational Therapy Evaluation Patient Details Name: [name] MRN: [id number] DOB: 1970/11/01 Today's Date: 01/21/2021    History of Present Illness [name] is a 50 yr old male who s/pACDF C5-T1.  PMH but not limited to swelling in B hnads, L side weakness, gait abnormalities, cervical radiculopathy, lumbar and sacral fusion   Clinical Impression   Pt presented sitting at EOB and agreed to session. Pt reports at PLOF they use a cane and wife would assist with donning AFO on RLE and IADLS in the home. Pt required min assist for LE ADLS due to following precautions and decrease in FM coordination of L hand. Pt was educated and voiced an understanding on how to increase in use/coordination of LUE. Pt currently with functional limitations due to the deficits listed below (see OT Problem List).  Pt will benefit from skilled OT to increase their safety and independence with ADL and functional mobility for ADL to facilitate discharge to venue listed below.      Follow Up Recommendations  Supervision/Assistance - 24 hour;Outpatient OT    Equipment Recommendations  None recommended by OT    Recommendations for Other Services       Precautions / Restrictions Precautions Precautions: Cervical Precaution Booklet Issued: Yes (comment) Precaution Comments: Pt required constant cues on following precautions Required Braces or Orthoses: Cervical Brace Cervical Brace: Hard collar Restrictions Weight Bearing Restrictions: No      Mobility Bed Mobility Overal bed mobility:  (presented sitting at EOB)                  Transfers Overall transfer level: Needs assistance   Transfers: Sit to/from Stand Sit to Stand: Supervision         General transfer comment: cues on pacing self as pt moves very quickly in session with transfers    Balance Overall balance assessment: Mild deficits observed, not formally tested                                          ADL either performed or assessed with clinical judgement   ADL Overall ADL's : Needs assistance/impaired Eating/Feeding: Set up;Sitting   Grooming: Wash/dry face;Wash/dry hands;Modified independent;Standing   Upper Body Bathing: Min guard;Cueing for safety;Cueing for sequencing;Sitting   Lower Body Bathing: Minimal assistance;Cueing for sequencing;Cueing for safety   Upper Body Dressing : Min guard;Cueing for safety;Cueing for sequencing   Lower Body Dressing: Minimal assistance;Cueing for safety;Cueing for sequencing;Sit to/from stand   Toilet Transfer: Supervision/safety;Ambulation;Grab bars   Toileting- Clothing Manipulation and Hygiene: Minimal assistance;Cueing for safety;Cueing for sequencing;Sit to/from stand   Tub/ Shower Transfer: Min guard;Cueing for safety;Cueing for sequencing   Functional mobility during ADLs: Min guard;Cueing for safety;Cueing for sequencing General ADL Comments: Pt did not have AFO donned at this time as at home     Vision         Perception     Praxis      Pertinent Vitals/Pain Pain Assessment: 0-10 Pain Score: 3  Pain Location: LUE Pain Descriptors / Indicators: Aching;Guarding Pain Intervention(s): Limited activity within patient's tolerance;Monitored during session     Hand Dominance Right   Extremity/Trunk Assessment Upper Extremity Assessment Upper Extremity Assessment: LUE deficits/detail LUE Deficits / Details: decrease in coordination/ROM LUE Coordination: decreased fine motor;decreased gross motor   Lower Extremity Assessment Lower Extremity Assessment: RLE deficits/detail RLE Deficits / Details: Pt uses an  AFO for ambulation   Cervical / Trunk Assessment Cervical / Trunk Assessment: Other exceptions (s/p sx)   Communication Communication Communication: No difficulties   Cognition Arousal/Alertness: Awake/alert Behavior During Therapy: WFL for tasks assessed/performed Overall Cognitive Status: Within Functional  Limits for tasks assessed                                     General Comments       Exercises     Shoulder Instructions      Home Living Family/patient expects to be discharged to:: Private residence Living Arrangements: Spouse/significant other Available Help at Discharge: Family Type of Home: House Home Access: Level entry     Home Layout: One level     Bathroom Shower/Tub: Producer, television/film/video: Standard Bathroom Accessibility: Yes   Home Equipment: Shower seat - built in;Grab bars - tub/shower;Cane - single point          Prior Functioning/Environment Level of Independence: Needs assistance  Gait / Transfers Assistance Needed: uses cane and AFO for RLE ADL's / Homemaking Assistance Needed: pt's wife completes IADLS and assists with donning AFO   Comments: using cane and uses AFO on RLE        OT Problem List: Decreased strength;Decreased range of motion;Decreased activity tolerance;Impaired balance (sitting and/or standing);Decreased coordination;Decreased safety awareness;Pain      OT Treatment/Interventions:      OT Goals(Current goals can be found in the care plan section) Acute Rehab OT Goals Patient Stated Goal: to go home later today OT Goal Formulation: With patient Time For Goal Achievement: 01/30/21 Potential to Achieve Goals: Good ADL Goals Pt Will Perform Eating: with modified independence;with adaptive utensils;sitting Pt Will Perform Lower Body Bathing: with modified independence;sit to/from stand Pt Will Perform Lower Body Dressing: with modified independence;sit to/from stand  OT Frequency: Min 2X/week   Barriers to D/C:            Co-evaluation              AM-PAC OT "6 Clicks" Daily Activity     Outcome Measure Help from another person eating meals?: A Little Help from another person taking care of personal grooming?: A Little Help from another person toileting, which includes using toliet,  bedpan, or urinal?: None Help from another person bathing (including washing, rinsing, drying)?: A Little Help from another person to put on and taking off regular upper body clothing?: A Little Help from another person to put on and taking off regular lower body clothing?: A Little 6 Click Score: 19   End of Session Equipment Utilized During Treatment: Gait belt;Cervical collar  Activity Tolerance: Patient tolerated treatment well Patient left: in bed;with call bell/phone within reach  OT Visit Diagnosis: Unsteadiness on feet (R26.81);Other abnormalities of gait and mobility (R26.89);Repeated falls (R29.6)                Time: 4709-6283 OT Time Calculation (min): 28 min Charges:  OT General Charges $OT Visit: 1 Visit OT Evaluation $OT Eval Low Complexity: 1 Low OT Treatments $Self Care/Home Management : 8-22 mins  Alphia Moh OTR/L  Acute Rehab Services  [phone] office number [phone] pager number   Alphia Moh 01/21/2021, 8:11 AM

## 2021-01-21 NOTE — Progress Notes (Signed)
Subjective: 1 Day Post-Op Procedure(s) (LRB): ANTERIOR CERVICAL DECOMPRESSION/DISCECTOMY FUSION 3  LEVELS C5-T1 (N/A) Patient reports pain as mild.   Radicular arm pain improved.  Tolerating PO without N/V. No significant swallowing issues. +ambulation +Void +flatus   Objective: Vital signs in last 24 hours: Temp:  [97.7 F (36.5 C)-98.9 F (37.2 C)] 97.8 F (36.6 C) (08/25 0730) Pulse Rate:  [62-88] 62 (08/25 0730) Resp:  [16-20] 17 (08/25 0730) BP: (107-160)/(70-92) 142/85 (08/25 0730) SpO2:  [92 %-100 %] 100 % (08/25 0730)  Intake/Output from previous day: 08/24 0701 - 08/25 0700 In: 2300 [I.V.:1600; IV Piggyback:700] Out: 836 [Urine:750; Drains:36; Blood:50] Intake/Output this shift: No intake/output data recorded.  No results for input(s): HGB in the last 72 hours. No results for input(s): WBC, RBC, HCT, PLT in the last 72 hours. No results for input(s): NA, K, CL, CO2, BUN, CREATININE, GLUCOSE, CALCIUM in the last 72 hours. No results for input(s): LABPT, INR in the last 72 hours.  Neurologically intact ABD soft Neurovascular intact Sensation intact distally Intact pulses distally Dorsiflexion/Plantar flexion intact Incision: moderate drainage Compartment soft 36 cc of serous aguinous output in drain total. Approx 5 cc over last 5 hours.   Assessment/Plan: 1 Day Post-Op Procedure(s) (LRB): ANTERIOR CERVICAL DECOMPRESSION/DISCECTOMY FUSION 3  LEVELS C5-T1 (N/A) Advance diet Up with therapy Encourage IS DVT Ppx: Teds, SCDs, ambulation Drainage appears to be slowing (<5 cc over last 5 hours). We will plan to pull drain this am and replace dressing.   D/C home today    Rhodia Albright 01/21/2021, 7:59 AM

## 2021-01-21 NOTE — Progress Notes (Signed)
PT Cancellation Note  Patient Details Name: [name] MRN: [id number] DOB: June 26, 1970   Cancelled Treatment:    Reason Eval/Treat Not Completed: PT screened, no needs identified, will sign off. Upon PT arrival pt observed ambulating back from bathroom independently. Pt also ambulating in halls independently at this time. Pt expresses good knowledge of neck precautions after recently completing OT session. Pt expresses no need for PT evaluation at this time and appears to be near mobility baseline. Acute PT signing off.   Arlyss Gandy 01/21/2021, 8:37 AM

## 2021-01-22 ENCOUNTER — Encounter (HOSPITAL_COMMUNITY): Payer: Self-pay | Admitting: Orthopedic Surgery

## 2021-01-22 NOTE — Discharge Summary (Signed)
Patient ID: [name] MRN: [id number] DOB/AGE: [id number] y.o.  Admit date: 01/20/2021 Discharge date: 01/22/2021  Admission Diagnoses:  Active Problems:   Cervical radiculopathy   Discharge Diagnoses:  Active Problems:   Cervical radiculopathy  status post Procedure(s): ANTERIOR CERVICAL DECOMPRESSION/DISCECTOMY FUSION 3  LEVELS C5-T1  Past Medical History:  Diagnosis Date   Angio-edema    Anxiety    Disseminated Lyme disease    Dystonia    Foot drop, right 2000   Headache    Hypertension 2019   Lyme disease    Neck pain    Rocky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    Urticaria     Surgeries: Procedure(s): ANTERIOR CERVICAL DECOMPRESSION/DISCECTOMY FUSION 3  LEVELS C5-T1 on 01/20/2021   Consultants:   Discharged Condition: Improved  Hospital Course: CYLUS DOUVILLE Chen is an 50 y.o. male who was admitted 01/20/2021 for operative treatment of Cervical spondylotic radiculopathy. Patient failed conservative treatments (please see the history and physical for the specifics) and had severe unremitting pain that affects sleep, daily activities and work/hobbies. After pre-op clearance, the patient was taken to the operating room on 01/20/2021 and underwent  Procedure(s): ANTERIOR CERVICAL DECOMPRESSION/DISCECTOMY FUSION 3  LEVELS C5-T1.    Patient was given perioperative antibiotics:  Anti-infectives (From admission, onward)    Start     Dose/Rate Route Frequency Ordered Stop   01/20/21 1530  ceFAZolin (ANCEF) IVPB 1 g/50 mL premix        1 g 100 mL/hr over 30 Minutes Intravenous Every 8 hours 01/20/21 1433 01/20/21 2335   01/20/21 0630  ceFAZolin (ANCEF) 2-4 GM/100ML-% IVPB       Note to Pharmacy: Clovis Cao   : cabinet override      01/20/21 0630 01/20/21 0800   01/20/21 0626  ceFAZolin (ANCEF) IVPB 2g/100 mL premix        2 g 200 mL/hr over 30 Minutes Intravenous 30 min pre-op 01/20/21 6010 01/20/21 1229        Patient was given sequential compression  devices and early ambulation to prevent DVT.   Patient benefited maximally from hospital stay and there were no complications. At the time of discharge, the patient was urinating/moving their bowels without difficulty, tolerating a regular diet, pain is controlled with oral pain medications and they have been cleared by PT/OT.   Recent vital signs: No data found.   Recent laboratory studies: No results for input(s): WBC, HGB, HCT, PLT, NA, K, CL, CO2, BUN, CREATININE, GLUCOSE, INR, CALCIUM in the last 72 hours.  Invalid input(s): PT, 2   Discharge Medications:   Allergies as of 01/21/2021       Reactions   Pregabalin Hives, Shortness Of Breath, Other (See Comments)   **ATTEMPTING TO TAKE THIS MEDICATION AGAIN** Insomnia Mood alter   Sulfa Antibiotics Anaphylaxis   Gabapentin Hives, Nausea And Vomiting, Nausea Only   Sweating, Fever, Hives Other reaction(s): Abdominal pain, Fever, Insomnia        Medication List     STOP taking these medications    diclofenac Sodium 1 % Gel Commonly known as: VOLTAREN   Oxycodone HCl 10 MG Tabs       TAKE these medications    famotidine 20 MG tablet Commonly known as: PEPCID Take by mouth as needed.   fluticasone 50 MCG/ACT nasal spray Commonly known as: FLONASE Place 2 sprays into both nostrils daily.   gabapentin 300 MG capsule Commonly known as: NEURONTIN Take 1 capsule (300 mg total) by  mouth 3 (three) times daily.   methocarbamol 500 MG tablet Commonly known as: Robaxin Take 1 tablet (500 mg total) by mouth every 8 (eight) hours as needed for up to 5 days for muscle spasms.   montelukast 10 MG tablet Commonly known as: SINGULAIR Take 10 mg by mouth daily.   ondansetron 4 MG tablet Commonly known as: Zofran Take 1 tablet (4 mg total) by mouth every 8 (eight) hours as needed for nausea or vomiting.   oxyCODONE-acetaminophen 10-325 MG tablet Commonly known as: Percocet Take 1 tablet by mouth every 6 (six) hours as  needed for up to 5 days for pain.   pseudoephedrine 120 MG 12 hr tablet Commonly known as: SUDAFED Take 120 mg by mouth daily.   rosuvastatin 20 MG tablet Commonly known as: CRESTOR TAKE 1 TABLET(20 MG) BY MOUTH DAILY   valsartan 40 MG tablet Commonly known as: DIOVAN TAKE 1 TABLET(40 MG) BY MOUTH DAILY What changed: See the new instructions.        Diagnostic Studies: DG Chest 2 View  Result Date: 01/12/2021 CLINICAL DATA:  Preop for cervical surgery. EXAM: CHEST - 2 VIEW COMPARISON:  June 07, 2020. FINDINGS: The heart size and mediastinal contours are within normal limits. Both lungs are clear. The visualized skeletal structures are unremarkable. IMPRESSION: No active cardiopulmonary disease. Electronically Signed   By: Lupita Raider M.D.   On: 01/12/2021 14:01   DG Cervical Spine 2 or 3 views  Result Date: 01/20/2021 CLINICAL DATA:  C5-T1 ACDF EXAM: CERVICAL SPINE - 2-3 VIEW COMPARISON:  05/14/2020 FLUOROSCOPY TIME:  1 minute 29 seconds Dose: 29.98 mGy Images: 5 FINDINGS: Anterior plate and screws identified at C5-T1. Disc prostheses at the intervening disc spaces. No fracture, subluxation, or bone destruction. IMPRESSION: Anterior fusion C5-T1. Electronically Signed   By: Ulyses Southward M.D.   On: 01/20/2021 14:30   DG C-Arm 1-60 Min-No Report  Result Date: 01/20/2021 Fluoroscopy was utilized by the requesting physician.  No radiographic interpretation.   XR Hand 2 View Left  Result Date: 01/19/2021 X-ray left hand 2 views Radiocarpal joint space appears normal.  Degenerative arthritis with bone spurring at first [facility] joint.  MCP and PIP joint spaces appear normal.  Some probable joint space narrowing at second DIP.  No other osteophytes and no erosions are seen.  Bone mineralization appears normal. Impression Osteoarthritis of first CMC joint otherwise overall normal  XR Hand 2 View Right  Result Date: 01/19/2021 X-ray right hand 2 views Radiocarpal joint space appears  normal.  Mild degenerative change at first [facility] joint and early osteophyte at base of first phalanx.  Normal-appearing MCP and PIP joint spaces.  Second third DIPs in slightly flexed position but no visible lateral osteophytes or erosions.  Bone mineralization appears normal. 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   Specialty: Orthopedic Surgery Why: If symptoms worsen, For suture removal, For wound re-check Contact information: [address] [place] [address] [phone]                 Discharge Plan:  discharge to home  Disposition: stable    Signed: Rhodia Albright for [facility] PA-C [facility] [phone] 01/22/2021, 3:59 PM

## 2021-02-01 ENCOUNTER — Encounter: Payer: Self-pay | Admitting: Family Medicine

## 2021-02-01 NOTE — Progress Notes (Signed)
Office Visit Note  Patient: [name]             Date of Birth: [birth year]           MRN: [id number]             PCP: Gerlene Fee, DO Referring: Gerlene Fee, DO Visit Date: 02/02/2021   Subjective:  Follow-up (Right thumb pain, left 2nd digit pain, patient has not had COVID vaccine)   History of Present Illness: Garrett PRESTIA Chen is a 50 y.o. 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 Since our visit he underwent the cervical spine decompression surgery and is still recovering from this with sutures and dermabond in place and c-collar. He notices a big improvement in strength and mobility of his hands, although still soreness and his thumb remains painful and limited in extension and grip strength on the right. He has had some ongoing urticaria symptoms along his left side and axillary area.   Previous HPI 01/19/21 Alessandra Grout Chen is a 50 y.o. male here for chronic pain and swelling of bilateral hands. He has had joint pains and inflammation since at least 2018 when he was evaluated with [facility] healthcare system including rheumatology and infectious disease workups. These were broadly negative for numerous serology including RF, CCP, ANA, and B. Burgdorferi Abs. He completed treatments with ceftriaxone and doxycycline at the time and no ongoing infectious concerns described by ID clinic. Rheumatology discussion of possible trying conventional DMARD in additional to chronic NSAID use. He has had ongoing subsequent orthopedics care with numerous degenerative and postraumatic changes in his cervical and lumbar spine and shoulders and previous SI joint fusion.  He was scheduled for anterior cervical spine decompression surgery urgency planned for tomorrow but apparently being postponed due to  abnormal clotting factor test. Currently his worst complaint is increasing pain swelling and stiffness affecting his bilateral hands and wrist severely limiting his activities.  Pain is worst on the thumb and around the MCP joints right hand worse than left.  He has trouble gripping objects tightly and when he uses his hands a lot during the day will experience worsening symptoms for up to 3 days afterwards.  Occasionally there is redness overlying the affected joints when they are in exacerbation but otherwise often appears normal.  He is also developed difficulty fully extending his fingers especially the fourth and fifth fingers of his left hand.  His thumb catches in a fixed position on the right side requiring use of his other hand or an object to restore motion.   Labs reviewed 07/2020 ESR 19 CRP 7.46   01/2020 ANA neg ESR 47 CRP 13     Review of Systems  Constitutional:  Positive for fatigue.  HENT:  Positive for mouth dryness.   Eyes:  Negative for dryness.  Respiratory:  Positive for shortness of breath.   Cardiovascular:  Negative for swelling in legs/feet.  Gastrointestinal:  Negative for constipation.  Endocrine: Positive for heat intolerance, excessive thirst and increased urination.  Genitourinary:  Positive for difficulty urinating and painful urination.  Musculoskeletal:  Positive for joint pain, gait problem, joint pain, joint swelling, muscle weakness, morning stiffness and muscle tenderness.  Skin:  Positive for rash.  Allergic/Immunologic: Positive for susceptible to infections.  Neurological:  Positive for numbness and weakness.  Hematological:  Positive for bruising/bleeding tendency.  Psychiatric/Behavioral:  Negative for sleep disturbance.    PMFS History:  Patient Active Problem List   Diagnosis Date Noted   Other spondylosis with myelopathy, cervical region 02/02/2021   Low back pain 01/19/2021   Swelling of both hands 12/22/2020   Neck pain on left side  09/30/2020   Gait abnormality 09/30/2020   Left-sided weakness 09/01/2020   History of Lyme disease 09/01/2020   Abdominal pain, chronic, epigastric 08/18/2020   Unintentional weight loss 08/18/2020   Chronic constipation 08/18/2020   History of pancreatitis 08/18/2020   Family history of colon cancer 08/18/2020   Sinusitis 06/07/2020   Pancreatitis 05/21/2020   Cervical radiculopathy 05/14/2020   Rash and other nonspecific skin eruption 01/09/2020   Essential hypertension 01/03/2020   Vitamin D deficiency 11/26/2019   Hyperglycemia 11/26/2019   Tobacco use 10/11/2019   Hyperlipemia    Fatty liver 05/06/2019   S/P lumbar fusion 11/15/2017   Labral tear of shoulder, degenerative, left 02/21/2017   Elevated LFTs 01/09/2017   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    Past Medical History:  Diagnosis Date   Angio-edema    Anxiety    Disseminated Lyme disease    Dystonia    Foot drop, right 2000   Headache    Hypertension 2019   Lyme disease    Neck pain    Rocky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    Urticaria     Family History  Problem Relation Age of Onset   Heart disease Mother    Heart disease Father    Colon polyps Father    ALS Sister    Osteoarthritis Brother    Colon cancer Paternal Grandfather        dx at age 82   Healthy Son    Healthy Son    Esophageal cancer Neg Hx    Inflammatory bowel disease Neg Hx    Liver disease Neg Hx    Pancreatic cancer Neg Hx    Stomach cancer Neg Hx    Past Surgical History:  Procedure Laterality Date   ADENOIDECTOMY     ANTERIOR CERVICAL DECOMPRESSION/DISCECTOMY FUSION 4 LEVELS N/A 01/20/2021   Procedure: ANTERIOR CERVICAL DECOMPRESSION/DISCECTOMY FUSION 3  LEVELS C5-T1;  Surgeon: Melina Schools, MD;  Location: [place];  Service: Orthopedics;  Laterality: N/A;   BACK SURGERY     SACROILIAC JOINT FUSION Right 11/15/2017   Procedure: SACROILIAC JOINT FUSION;  Surgeon: Melina Schools, MD;  Location: [place];  Service: Orthopedics;   Laterality: Right;  120 mins   SACROILIAC JOINT FUSION Left 07/25/2018   Procedure: SACROILIAC JOINT FUSION;  Surgeon: Melina Schools, MD;  Location: [place];  Service: Orthopedics;  Laterality: Left;  SACROILIAC JOINT FUSION   TONSILLECTOMY     50 years old   Social History   Social History Narrative   Lives at home with his wife.   Right-handed.   Caffeine use: two cans of [facility].   Immunization History  Administered Date(s) Administered   Influenza Split 01/09/2017   Influenza,inj,quad, With Preservative 01/09/2017   Tdap 11/16/2004     Objective: Vital Signs: BP (!) 152/71 (BP Location: Left Arm, Patient Position: Sitting, Cuff Size: Normal)   Pulse 86   Resp 15   Ht 5' 10.5" (1.791 m)   Wt 237 lb (107.5 kg)   BMI 33.53 kg/m    Physical Exam Eyes:     Conjunctiva/sclera: Conjunctivae normal.  Skin:    General: Skin is  warm and dry.     Findings: No rash.  Neurological:     General: No focal deficit present.     Mental Status: He is alert.  Psychiatric:        Mood and Affect: Mood normal.     Musculoskeletal Exam:  Patient in C-collar, surgical site in steri-strips Elbows full ROM no tenderness or swelling Wrists full ROM no tenderness or swelling Tenderness to pressure over right 1st CMC and MCP otherwise no swelling or tenderness, slightly decreased right thumb extension ROM else normal Knees full ROM no tenderness or swelling  Investigation: No additional findings.  Imaging: DG Chest 2 View  Result Date: 01/12/2021 CLINICAL DATA:  Preop for cervical surgery. EXAM: CHEST - 2 VIEW COMPARISON:  June 07, 2020. FINDINGS: The heart size and mediastinal contours are within normal limits. Both lungs are clear. The visualized skeletal structures are unremarkable. IMPRESSION: No active cardiopulmonary disease. Electronically Signed   By: Marijo Conception M.D.   On: 01/12/2021 14:01   DG Cervical Spine 2 or 3 views  Result Date: 01/20/2021 CLINICAL DATA:   C5-T1 ACDF EXAM: CERVICAL SPINE - 2-3 VIEW COMPARISON:  05/14/2020 FLUOROSCOPY TIME:  1 minute 29 seconds Dose: 29.98 mGy Images: 5 FINDINGS: Anterior plate and screws identified at C5-T1. Disc prostheses at the intervening disc spaces. No fracture, subluxation, or bone destruction. IMPRESSION: Anterior fusion C5-T1. Electronically Signed   By: Lavonia Dana M.D.   On: 01/20/2021 14:30   DG C-Arm 1-60 Min-No Report  Result Date: 01/20/2021 Fluoroscopy was utilized by the requesting physician.  No radiographic interpretation.   XR Hand 2 View Left  Result Date: 01/19/2021 X-ray left hand 2 views Radiocarpal joint space appears normal.  Degenerative arthritis with bone spurring at first [facility] joint.  MCP and PIP joint spaces appear normal.  Some probable joint space narrowing at second DIP.  No other osteophytes and no erosions are seen.  Bone mineralization appears normal. Impression Osteoarthritis of first [place] joint otherwise overall normal  XR Hand 2 View Right  Result Date: 01/19/2021 X-ray right hand 2 views Radiocarpal joint space appears normal.  Mild degenerative change at first [facility] joint and early osteophyte at base of first phalanx.  Normal-appearing MCP and PIP joint spaces.  Second third DIPs in slightly flexed position but no visible lateral osteophytes or erosions.  Bone mineralization appears normal. 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  Procedures:  No procedures performed Allergies: Pregabalin, Sulfa antibiotics, and Gabapentin   Assessment / Plan:     Visit Diagnoses: Swelling of both hands  He still has pain but no objective inflammation on exam today. Seems to be improving after surgery besides the thumb OA. I discussed conservative treatments including soft compressive gloves to stabilize the base of the thumb, as needed NSAID use  and topical NSAIDs, and he has upcoming therapy for postop recovery already planned. Discussed if symptoms worsen a lot we can take a look such as changing medications or local steroid injection. If inflammation develops more obviously could try DMARD treatment of suspected PTLS but low evidence and no strong indication today.  Other spondylosis with myelopathy, cervical region  Bilateral hand swelling numbness and pain are improving since his cervical spine decompression mostly only limited in strength and movement of the thumb now. Still in collar and has clinic f/u later today.  Rash and other nonspecific skin eruption  Reports some continued episodes or urticaria type rash on multiple antihistamines for this. None active today, could be CIU that can also have associated arthralgias.   Orders: No orders of the defined types were placed in this encounter.  No orders of the defined types were placed in this encounter.    Follow-Up Instructions: Return in about 5 months (around 07/05/2021) for OA ?PTLS postop f/u ~2mos.   Collier Salina, MD  Note - This record has been created using [employer].  Chart creation errors have been sought, but may not always  have been located. Such creation errors do not reflect on  the standard of medical care.

## 2021-02-02 ENCOUNTER — Other Ambulatory Visit: Payer: Self-pay

## 2021-02-02 ENCOUNTER — Ambulatory Visit: Payer: 59 | Admitting: Internal Medicine

## 2021-02-02 ENCOUNTER — Encounter: Payer: Self-pay | Admitting: Internal Medicine

## 2021-02-02 VITALS — BP 152/71 | HR 86 | Resp 15 | Ht 70.5 in | Wt 237.0 lb

## 2021-02-02 DIAGNOSIS — M7989 Other specified soft tissue disorders: Secondary | ICD-10-CM | POA: Diagnosis not present

## 2021-02-02 DIAGNOSIS — R21 Rash and other nonspecific skin eruption: Secondary | ICD-10-CM | POA: Diagnosis not present

## 2021-02-02 DIAGNOSIS — M4712 Other spondylosis with myelopathy, cervical region: Secondary | ICD-10-CM | POA: Insufficient documentation

## 2021-02-03 ENCOUNTER — Other Ambulatory Visit: Payer: Self-pay | Admitting: Family Medicine

## 2021-02-09 ENCOUNTER — Other Ambulatory Visit: Payer: Self-pay | Admitting: Allergy & Immunology

## 2021-02-09 NOTE — Telephone Encounter (Signed)
Last filled by Rice, Christopher W, MD 01/10/21. Patient needs an appointment for further refills. Last seen September of 2021 and was due back December of 2021.  

## 2021-02-09 NOTE — Telephone Encounter (Signed)
Last filled by Fuller Plan, MD 01/10/21. Patient needs an appointment for further refills. Last seen September of 2021 and was due back December of 2021.

## 2021-02-11 ENCOUNTER — Other Ambulatory Visit: Payer: Self-pay

## 2021-02-11 DIAGNOSIS — I10 Essential (primary) hypertension: Secondary | ICD-10-CM

## 2021-02-11 MED ORDER — VALSARTAN 40 MG PO TABS: 40.0000 mg | ORAL_TABLET | Freq: Every day | ORAL | 0 refills | Status: DC

## 2021-03-04 DIAGNOSIS — Z0271 Encounter for disability determination: Secondary | ICD-10-CM

## 2021-03-18 ENCOUNTER — Encounter: Payer: Self-pay | Admitting: Internal Medicine

## 2021-03-18 DIAGNOSIS — M4712 Other spondylosis with myelopathy, cervical region: Secondary | ICD-10-CM

## 2021-03-18 DIAGNOSIS — M180 Bilateral primary osteoarthritis of first carpometacarpal joints: Secondary | ICD-10-CM

## 2021-03-19 NOTE — Telephone Encounter (Signed)
I recommended trying a compressive glove for osteoarthritis of the hand and wrist. These can be obtained at pharmacy or medical supply store, we can provide a written order for such if he wants. I think he already has rehab ordered from after the spine surgery since occupational therapist would be the best evaluation for any other type of orthotics.

## 2021-03-26 NOTE — Telephone Encounter (Signed)
We can refer to whichever is available close to him-he has osteoarthritis of bilateral thumbs and possibly nerve damage from cervical spine disease. I don't have a specific occupational therapist or office to recommend for this versus others.

## 2021-03-31 ENCOUNTER — Ambulatory Visit: Payer: 59 | Attending: Internal Medicine | Admitting: Occupational Therapy

## 2021-03-31 ENCOUNTER — Other Ambulatory Visit: Payer: Self-pay

## 2021-03-31 DIAGNOSIS — M79641 Pain in right hand: Secondary | ICD-10-CM | POA: Insufficient documentation

## 2021-03-31 DIAGNOSIS — M79642 Pain in left hand: Secondary | ICD-10-CM | POA: Diagnosis present

## 2021-03-31 DIAGNOSIS — M25641 Stiffness of right hand, not elsewhere classified: Secondary | ICD-10-CM | POA: Insufficient documentation

## 2021-03-31 DIAGNOSIS — R278 Other lack of coordination: Secondary | ICD-10-CM | POA: Diagnosis present

## 2021-03-31 DIAGNOSIS — M6281 Muscle weakness (generalized): Secondary | ICD-10-CM | POA: Diagnosis present

## 2021-03-31 DIAGNOSIS — M25642 Stiffness of left hand, not elsewhere classified: Secondary | ICD-10-CM | POA: Diagnosis present

## 2021-03-31 NOTE — Therapy (Signed)
[facility] [address] [place], [place], [address] Phone: [phone]   Fax:  [phone]  Occupational Therapy Evaluation  Patient Details  Name: [name] MRN: [id number] Date of Birth: 07-14-1970 Referring Provider (OT): Dr. Sheliah Hatch   Encounter Date: 03/31/2021   OT End of Session - 03/31/21 1144     Visit Number 1    Number of Visits 20    Date for OT Re-Evaluation 06/12/21    Authorization Type [employer]    OT Start Time 0845    OT Stop Time 0930    OT Time Calculation (min) 45 min    Activity Tolerance Patient limited by pain    Behavior During Therapy [facility] for tasks assessed/performed             Past Medical History:  Diagnosis Date   Angio-edema    Anxiety    Disseminated Lyme disease    Dystonia    Foot drop, right 2000   Headache    Hypertension 2019   Lyme disease    Neck pain    Rocky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    Urticaria     Past Surgical History:  Procedure Laterality Date   ADENOIDECTOMY     ANTERIOR CERVICAL DECOMPRESSION/DISCECTOMY FUSION 4 LEVELS N/A 01/20/2021   Procedure: ANTERIOR CERVICAL DECOMPRESSION/DISCECTOMY FUSION 3  LEVELS C5-T1;  Surgeon: Venita Lick, MD;  Location: [facility] OR;  Service: Orthopedics;  Laterality: N/A;   BACK SURGERY     SACROILIAC JOINT FUSION Right 11/15/2017   Procedure: SACROILIAC JOINT FUSION;  Surgeon: Venita Lick, MD;  Location: [facility] OR;  Service: Orthopedics;  Laterality: Right;  120 mins   SACROILIAC JOINT FUSION Left 07/25/2018   Procedure: SACROILIAC JOINT FUSION;  Surgeon: Venita Lick, MD;  Location: [facility] OR;  Service: Orthopedics;  Laterality: Left;  SACROILIAC JOINT FUSION   TONSILLECTOMY     50 years old    There were no vitals filed for this visit.   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 Yes    Pain Score 8     Pain Location Wrist   bilaterally   Pain Orientation Right;Left    Pain Descriptors / Indicators Throbbing;Sharp;Sore   sore Lt, sharper Rt right now   Pain Type Chronic pain    Pain Onset More than a month ago    Pain Frequency Constant    Aggravating Factors  heat    Pain Relieving Factors nothing               [facility] OT Assessment - 03/31/21 0001       Assessment   Medical Diagnosis OA bilateral hands, cervical spondylosis w/ myelopathy   from Lyme's dz, recent h/o ACDF C5-T1   Referring Provider (OT) Dr. Sheliah Hatch    Onset Date/Surgical Date --   approx 2 years   Hand Dominance Right    Prior Therapy none      Precautions   Precaution Comments Rt AFO, multiple surgeries      Balance Screen   Has  the patient fallen in the past 6 months No    Has the patient had a decrease in activity level because of a fear of falling?  Yes      Home  Environment   Educational psychologist   built in seat and grab bars   Additional Comments Pt lives in 1 level home w/ level entry    Lives With Spouse      Prior Function   Level of Independence Needs assistance with ADLs    Vocation Unemployed   filing for disability     ADL   Eating/Feeding Needs assist with cutting food    Grooming Moderate assistance   dep for shaving, mostly brushes teeth - occasional help   Upper Body Bathing Maximal assistance    Lower Body Bathing Maximal assistance    Upper Body Dressing Maximal assistance    Lower Body Dressing Maximal assistance    Toilet Transfer Modified independent    Toileting - Clothing Manipulation --   needs assist buttoning pants and hooking belt   Toileting -  Hygiene Increase time   w/ difficulty   Tub/Shower  Transfer Modified independent   walk in shower     IADL   Shopping Completely unable to shop    Light Housekeeping Does not participate in any housekeeping tasks    Meal Prep Able to complete simple cold meal and snack prep;Able to complete simple warm meal prep    Community Mobility Drives own vehicle   short distances (lives 4 blocks from clinic)   Medication Management --   wife has to help open medicine bottles     Mobility   Mobility Status Comments walks w/ cane if not wearing AFO      Written Expression   Dominant Hand Right    Handwriting Increased time;50% legible   modified pen grip d/t limited mobility and pain     Vision - History   Baseline Vision No visual deficits      Cognition   Cognition Comments Affected by pain, brain fog has improved however      Observation/Other Assessments   Observations limited bilateral hand function (Lt worse than Rt), pt goes in finger flexion bilaterally and thumbs positioned in adduction (lateral pinch), pt became tearful during evaluation d/t pain Rt thumb however still has more function Rt hand than Lt hand      Sensation   Light Touch Impaired by gross assessment    Additional Comments Lt thumb numb and reports pain has actually improved somewhat Lt thumb      Coordination   Box and Blocks Rt = 17 (not using thumb latter 1/2), Lt = 16 (not using thumb at all)    Coordination can pick up pen Rt hand but limited in hand manipulation, cannot pick up pen Lt hand      Edema   Edema mild to moderate Rt hand      ROM / Strength   AROM / PROM / Strength AROM      AROM   Overall AROM Comments BUE shoulder ROM WNL's, elbow flexion, supinatio/pronation, and wrist ROM WFL's, limited end range elbow ext (last 10%). Bilateral hands in flexed position with approx 90% active extension Rt hand except index PIP joint, approx 40-50% active extension Lt hand, and bilateral thumbs rest in adduction w/ limited thumb movement d/t pain.      Hand  Function   Right Hand Grip (lbs) TBA    Left Hand  Grip (lbs) TBA                      OT Treatments/Exercises (OP) - 03/31/21 0001       Splinting   Splinting Pt fitted for neoprene CMC splints for bilateral thumbs for pain management and improved positioning (hand based Rt and forearm based Lt d/t limited sizes available in clinic). Educated in wear and care                      OT Short Term Goals - 03/31/21 1221       OT SHORT TERM GOAL #1   Title Independent with daytime and pm bilateral splints to improve positioning, function, and pain    Time 4    Period Weeks    Status New      OT SHORT TERM GOAL #2   Title Independent with initial HEP    Time 4    Period Weeks    Status New      OT SHORT TERM GOAL #3   Title Pt to verbalize understanding with A/E recommendations and task modifications to increase independence with ADLS (including cutting food, writing, dressing/buttons & belt, bathing, etc)    Time 4    Period Weeks    Status New               OT Long Term Goals - 03/31/21 1454       OT LONG TERM GOAL #1   Title Pt independent with updated HEP    Time 8    Period Weeks    Status New      OT LONG TERM GOAL #2   Title Pt to improve BUE hand function as evidenced by increasing Box & Blocks score by 6    Baseline Rt = 17, Lt = 16    Time 8    Period Weeks    Status New      OT LONG TERM GOAL #3   Title Pt overall mod assist or less for BADLS using A/E prn    Time 8    Period Weeks    Status New      OT LONG TERM GOAL #4   Title Pt to report pain 5/10 or under bilateral thumbs    Baseline 8/10    Time 8    Period Weeks    Status New      OT LONG TERM GOAL #5   Title Pt to demo 75% composite extension Lt hand in prep for functional tasks/grasping    Time 8    Period Weeks    Status New                   Plan - 03/31/21 1215     Clinical Impression Statement Pt is a 50 y.o. male who presents to OPOT  with OA of bilateral hands, cervical spondylosis w/ myelopathy (recent ACDF C5-T1 in Aug 2022) which all began approx 2 years ago from Lyme's disease. Pt presents with significant pain in bilateral thumbs (Rt now greater than Lt) and limited motion and function bilateral hands. Pt is limited in all ADLS and functional use of hands. Pt would benefit from O.T. to address splinting needs, A/E recommendations and task modifications, and HEP to hopefully improve ROM and function.    OT Occupational Profile and History Detailed Assessment- Review of Records and additional review of physical, cognitive, psychosocial history related to current  functional performance    Occupational performance deficits (Please refer to evaluation for details): ADL's;IADL's;Leisure;Social Participation;Work    Games developer / Function / Physical Skills ADL;Coordination;GMC;Endurance;UE functional use;Sensation;Body mechanics;Flexibility;IADL;Pain;FMC;Dexterity;Strength;Edema;Mobility;ROM    Rehab Potential Fair   time since onset, severity of pain   Clinical Decision Making Several treatment options, min-mod task modification necessary    Comorbidities Affecting Occupational Performance: Presence of comorbidities impacting occupational performance    Comorbidities impacting occupational performance description: Lymes disease    Modification or Assistance to Complete Evaluation  Min-Moderate modification of tasks or assist with assess necessary to complete eval    OT Frequency 2x / week    OT Duration --   10 weeks (may only need 8 weeks)   OT Treatment/Interventions Self-care/ADL training;Cryotherapy;Therapeutic exercise;DME and/or AE instruction;Functional Mobility Training;Cognitive remediation/compensation;Ultrasound;Manual Therapy;Splinting;Passive range of motion;Therapeutic activities;Patient/family education;Coping strategies training    Plan assess bilateral grip strength if able and update goals prn, begin fabrication  of resting hand splint for Rt hand    Consulted and Agree with Plan of Care Patient             Patient will benefit from skilled therapeutic intervention in order to improve the following deficits and impairments:   Body Structure / Function / Physical Skills: ADL, Coordination, GMC, Endurance, UE functional use, Sensation, Body mechanics, Flexibility, IADL, Pain, FMC, Dexterity, Strength, Edema, Mobility, ROM       Visit Diagnosis: Pain in right hand  Pain in left hand  Stiffness of left hand, not elsewhere classified  Stiffness of right hand, not elsewhere classified  Muscle weakness (generalized)  Other lack of coordination    Problem List Patient Active Problem List   Diagnosis Date Noted   Other spondylosis with myelopathy, cervical region 02/02/2021   Low back pain 01/19/2021   Swelling of both hands 12/22/2020   Neck pain on left side 09/30/2020   Gait abnormality 09/30/2020   Left-sided weakness 09/01/2020   History of Lyme disease 09/01/2020   Abdominal pain, chronic, epigastric 08/18/2020   Unintentional weight loss 08/18/2020   Chronic constipation 08/18/2020   History of pancreatitis 08/18/2020   Family history of colon cancer 08/18/2020   Sinusitis 06/07/2020   Pancreatitis 05/21/2020   Cervical radiculopathy 05/14/2020   Rash and other nonspecific skin eruption 01/09/2020   Essential hypertension 01/03/2020   Vitamin D deficiency 11/26/2019   Hyperglycemia 11/26/2019   Tobacco use 10/11/2019   Hyperlipemia    Fatty liver 05/06/2019   S/P lumbar fusion 11/15/2017   Labral tear of shoulder, degenerative, left 02/21/2017   Elevated LFTs 01/09/2017   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

## 2021-04-06 ENCOUNTER — Ambulatory Visit: Payer: 59 | Admitting: Occupational Therapy

## 2021-04-06 ENCOUNTER — Other Ambulatory Visit: Payer: Self-pay

## 2021-04-06 DIAGNOSIS — M79641 Pain in right hand: Secondary | ICD-10-CM | POA: Diagnosis not present

## 2021-04-06 DIAGNOSIS — M25641 Stiffness of right hand, not elsewhere classified: Secondary | ICD-10-CM

## 2021-04-06 DIAGNOSIS — M79642 Pain in left hand: Secondary | ICD-10-CM

## 2021-04-06 DIAGNOSIS — M25642 Stiffness of left hand, not elsewhere classified: Secondary | ICD-10-CM

## 2021-04-06 NOTE — Patient Instructions (Signed)
Palmar Adduction/Abduction (Active)    Move thumb down in front of palm. Move back to rest along palm. Repeat ___10_ times. Do __4__ sessions per day. Do with both hands  Opposition (Active)    Touch tip of thumb to nail tip of each finger in turn, making an "O" shape. Keep the "O" big. Only do this with Rt thumb Repeat _10___ times. Do __4__ sessions per day.

## 2021-04-06 NOTE — Therapy (Signed)
[facility] [address] [place], [place], [address] Phone: [phone]   Fax:  [phone]  Occupational Therapy Treatment  Patient Details  Name: [name] MRN: [id number] Date of Birth: 02/19/1971 Referring Provider (OT): Dr. Sheliah Hatch   Encounter Date: 04/06/2021   OT End of Session - 04/06/21 1413     Visit Number 2    Number of Visits 20    Date for OT Re-Evaluation 06/12/21    Authorization Type [employer]    OT Start Time 1015    OT Stop Time 1100    OT Time Calculation (min) 45 min    Activity Tolerance Patient limited by pain    Behavior During Therapy [facility] for tasks assessed/performed             Past Medical History:  Diagnosis Date   Angio-edema    Anxiety    Disseminated Lyme disease    Dystonia    Foot drop, right 2000   Headache    Hypertension 2019   Lyme disease    Neck pain    Rocky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    Urticaria     Past Surgical History:  Procedure Laterality Date   ADENOIDECTOMY     ANTERIOR CERVICAL DECOMPRESSION/DISCECTOMY FUSION 4 LEVELS N/A 01/20/2021   Procedure: ANTERIOR CERVICAL DECOMPRESSION/DISCECTOMY FUSION 3  LEVELS C5-T1;  Surgeon: Venita Lick, MD;  Location: [facility] OR;  Service: Orthopedics;  Laterality: N/A;   BACK SURGERY     SACROILIAC JOINT FUSION Right 11/15/2017   Procedure: SACROILIAC JOINT FUSION;  Surgeon: Venita Lick, MD;  Location: [facility] OR;  Service: Orthopedics;  Laterality: Right;  120 mins   SACROILIAC JOINT FUSION Left 07/25/2018   Procedure: SACROILIAC JOINT FUSION;  Surgeon: Venita Lick, MD;  Location: [facility] OR;  Service: Orthopedics;  Laterality: Left;  SACROILIAC JOINT FUSION   TONSILLECTOMY     50 years old    There were no vitals filed for this visit.   Subjective Assessment - 04/06/21 1020     Subjective  The braces are helping with the thumb positioning and pain, especially Lt hand    Pertinent History OA  Bilateral hands and cervical spondylosis w/ myelopathy (due to disseminated Lyme's dz) w/ ACDF 01/20/21 c5-t1, SI joint fusions Rt 2019, Lt 2020    Patient Stated Goals to use my thumbs and hands better with less pain    Currently in Pain? Yes    Pain Score 5     Pain Location --   Rt thumb and CMC joint   Pain Orientation Right    Pain Descriptors / Indicators Throbbing;Sharp;Sore    Pain Type Chronic pain    Pain Onset More than a month ago    Pain Frequency Constant    Aggravating Factors  heat    Pain Relieving Factors nothing                [facility] OT Assessment - 04/06/21 0001       Hand Function   Right Hand Grip (lbs) 18.7 lbs    Left Hand Grip (lbs) 14.3 lbs   assist to open fingers to get hand around and to release dynamometer                     OT Treatments/Exercises (OP) - 04/06/21 0001       Exercises   Exercises --   Issued thumb HEP - SEE pt instructions  Splinting   Splinting Fabricated and fitted resting hand splint for Rt hand and issued. Reviewed wear and care (gradually increasing tolerance over next few days to weeks until pt can wear a full night)                    OT Education - 04/06/21 1027     Education Details thumb HEP, splint wear and care    Person(s) Educated Patient    Methods Explanation;Demonstration;Handout;Verbal cues   on thumb HEP, verbal review of splint wear and care   Comprehension Verbalized understanding;Returned demonstration              OT Short Term Goals - 04/06/21 1414       OT SHORT TERM GOAL #1   Title Independent with daytime and pm bilateral splints to improve positioning, function, and pain    Time 4    Period Weeks    Status On-going      OT SHORT TERM GOAL #2   Title Independent with initial HEP    Time 4    Period Weeks    Status On-going      OT SHORT TERM GOAL #3   Title Pt to verbalize understanding with A/E recommendations and task modifications to increase  independence with ADLS (including cutting food, writing, dressing/buttons & belt, bathing, etc)    Time 4    Period Weeks    Status New               OT Long Term Goals - 03/31/21 1454       OT LONG TERM GOAL #1   Title Pt independent with updated HEP    Time 8    Period Weeks    Status New      OT LONG TERM GOAL #2   Title Pt to improve BUE hand function as evidenced by increasing Box & Blocks score by 6    Baseline Rt = 17, Lt = 16    Time 8    Period Weeks    Status New      OT LONG TERM GOAL #3   Title Pt overall mod assist or less for BADLS using A/E prn    Time 8    Period Weeks    Status New      OT LONG TERM GOAL #4   Title Pt to report pain 5/10 or under bilateral thumbs    Baseline 8/10    Time 8    Period Weeks    Status New      OT LONG TERM GOAL #5   Title Pt to demo 75% composite extension Lt hand in prep for functional tasks/grasping    Time 8    Period Weeks    Status New                   Plan - 04/06/21 1418     Clinical Impression Statement Pt returns today with decreased pain bilateral thumbs when wearing neoprene braces. However, pain increases when braces are off    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 ADL;Coordination;GMC;Endurance;UE functional use;Sensation;Body mechanics;Flexibility;IADL;Pain;FMC;Dexterity;Strength;Edema;Mobility;ROM    Rehab Potential Fair   time since onset, severity of pain   Clinical Decision Making  Several treatment options, min-mod task modification necessary    Comorbidities Affecting Occupational Performance: Presence of comorbidities impacting occupational performance    Comorbidities  impacting occupational performance description: Lymes disease    Modification or Assistance to Complete Evaluation  Min-Moderate modification of tasks or assist with assess necessary to complete eval    OT Frequency 2x / week    OT Duration --   10 weeks (may only need 8 weeks)   OT Treatment/Interventions Self-care/ADL training;Cryotherapy;Therapeutic exercise;DME and/or AE instruction;Functional Mobility Training;Cognitive remediation/compensation;Ultrasound;Manual Therapy;Splinting;Passive range of motion;Therapeutic activities;Patient/family education;Coping strategies training    Plan splint adjustments prn, begin fabrication of Lt resting hand splint    Consulted and Agree with Plan of Care Patient             Patient will benefit from skilled therapeutic intervention in order to improve the following deficits and impairments:   Body Structure / Function / Physical Skills: ADL, Coordination, GMC, Endurance, UE functional use, Sensation, Body mechanics, Flexibility, IADL, Pain, FMC, Dexterity, Strength, Edema, Mobility, ROM       Visit Diagnosis: Pain in right hand  Pain in left hand  Stiffness of left hand, not elsewhere classified  Stiffness of right hand, not elsewhere classified    Problem List Patient Active Problem List   Diagnosis Date Noted   Other spondylosis with myelopathy, cervical region 02/02/2021   Low back pain 01/19/2021   Swelling of both hands 12/22/2020   Neck pain on left side 09/30/2020   Gait abnormality 09/30/2020   Left-sided weakness 09/01/2020   History of Lyme disease 09/01/2020   Abdominal pain, chronic, epigastric 08/18/2020   Unintentional weight loss 08/18/2020   Chronic constipation 08/18/2020   History of pancreatitis 08/18/2020   Family history of colon cancer 08/18/2020   Sinusitis 06/07/2020   Pancreatitis 05/21/2020   Cervical radiculopathy 05/14/2020   Rash and other nonspecific skin eruption 01/09/2020   Essential  hypertension 01/03/2020   Vitamin D deficiency 11/26/2019   Hyperglycemia 11/26/2019   Tobacco use 10/11/2019   Hyperlipemia    Fatty liver 05/06/2019   S/P lumbar fusion 11/15/2017   Labral tear of shoulder, degenerative, left 02/21/2017   Elevated LFTs 01/09/2017   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

## 2021-04-08 ENCOUNTER — Other Ambulatory Visit: Payer: Self-pay

## 2021-04-08 ENCOUNTER — Ambulatory Visit: Payer: 59 | Admitting: Occupational Therapy

## 2021-04-08 ENCOUNTER — Encounter: Payer: Self-pay | Admitting: Occupational Therapy

## 2021-04-08 DIAGNOSIS — M79641 Pain in right hand: Secondary | ICD-10-CM | POA: Diagnosis not present

## 2021-04-08 DIAGNOSIS — M25641 Stiffness of right hand, not elsewhere classified: Secondary | ICD-10-CM

## 2021-04-08 DIAGNOSIS — M25642 Stiffness of left hand, not elsewhere classified: Secondary | ICD-10-CM

## 2021-04-08 DIAGNOSIS — M79642 Pain in left hand: Secondary | ICD-10-CM

## 2021-04-08 DIAGNOSIS — R278 Other lack of coordination: Secondary | ICD-10-CM

## 2021-04-08 DIAGNOSIS — M6281 Muscle weakness (generalized): Secondary | ICD-10-CM

## 2021-04-08 NOTE — Therapy (Signed)
[facility] [address] [place], [place], [address] Phone: [phone]   Fax:  [phone]  Occupational Therapy Treatment  Patient Details  Name: [name] MRN: [id number] Date of Birth: Feb 18, 1971 Referring Provider (OT): Dr. Sheliah Hatch   Encounter Date: 04/08/2021   OT End of Session - 04/08/21 0856     Visit Number 3    Number of Visits 20    Date for OT Re-Evaluation 06/12/21    Authorization Type [employer]    OT Start Time [phone]    OT Stop Time 0930    OT Time Calculation (min) 38 min    Activity Tolerance Patient limited by pain    Behavior During Therapy [facility] for tasks assessed/performed             Past Medical History:  Diagnosis Date   Angio-edema    Anxiety    Disseminated Lyme disease    Dystonia    Foot drop, right 2000   Headache    Hypertension 2019   Lyme disease    Neck pain    Rocky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    Urticaria     Past Surgical History:  Procedure Laterality Date   ADENOIDECTOMY     ANTERIOR CERVICAL DECOMPRESSION/DISCECTOMY FUSION 4 LEVELS N/A 01/20/2021   Procedure: ANTERIOR CERVICAL DECOMPRESSION/DISCECTOMY FUSION 3  LEVELS C5-T1;  Surgeon: Venita Lick, MD;  Location: [facility] OR;  Service: Orthopedics;  Laterality: N/A;   BACK SURGERY     SACROILIAC JOINT FUSION Right 11/15/2017   Procedure: SACROILIAC JOINT FUSION;  Surgeon: Venita Lick, MD;  Location: [facility] OR;  Service: Orthopedics;  Laterality: Right;  120 mins   SACROILIAC JOINT FUSION Left 07/25/2018   Procedure: SACROILIAC JOINT FUSION;  Surgeon: Venita Lick, MD;  Location: [facility] OR;  Service: Orthopedics;  Laterality: Left;  SACROILIAC JOINT FUSION   TONSILLECTOMY     50 years old    There were no vitals filed for this visit.   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 Yes    Pain Score 4     Pain Location Hand    Pain Orientation Right;Left    Pain Descriptors / Indicators Throbbing;Sharp;Sore    Pain Type Chronic pain    Pain Onset More than a month ago    Pain Frequency Constant    Aggravating Factors  use    Pain Relieving Factors ice for short periods, pain medications              Fabricated L resting hand splint for improved positioning (for eventual night wear); however pt will begin to wear only 2-3 hrs at a times as tolerated during the day and gradually incr as tolerated.  Pt able to verbalize wear schedule of R resting hand splint (currently wearing approx 2-3 hrs 3-4x/day), but hand throbs while wearing (also throbs at times when not wearing).            OT Education - 04/08/21 0930     Education Details Reviewed resting hand splint wear/care for bilateral hands and instructed  pt to bring R resting hand splint to next session to assess if adjustments are needed    Person(s) Educated Patient    Methods Explanation;Demonstration    Comprehension Verbalized understanding              OT Short Term Goals - 04/06/21 1414       OT SHORT TERM GOAL #1   Title Independent with daytime and pm bilateral splints to improve positioning, function, and pain    Time 4    Period Weeks    Status On-going      OT SHORT TERM GOAL #2   Title Independent with initial HEP    Time 4    Period Weeks    Status On-going      OT SHORT TERM GOAL #3   Title Pt to verbalize understanding with A/E recommendations and task modifications to increase independence with ADLS (including cutting food, writing, dressing/buttons & belt, bathing, etc)    Time 4    Period Weeks    Status New                OT Long Term Goals - 03/31/21 1454       OT LONG TERM GOAL #1   Title Pt independent with updated HEP    Time 8    Period Weeks    Status New      OT LONG TERM GOAL #2   Title Pt to improve BUE hand function as evidenced by increasing Box & Blocks score by 6    Baseline Rt = 17, Lt = 16    Time 8    Period Weeks    Status New      OT LONG TERM GOAL #3   Title Pt overall mod assist or less for BADLS using A/E prn    Time 8    Period Weeks    Status New      OT LONG TERM GOAL #4   Title Pt to report pain 5/10 or under bilateral thumbs    Baseline 8/10    Time 8    Period Weeks    Status New      OT LONG TERM GOAL #5   Title Pt to demo 75% composite extension Lt hand in prep for functional tasks/grasping    Time 8    Period Weeks    Status New                   Plan - 04/08/21 0857     Clinical Impression Statement Pt returns today with decreased pain bilateral thumbs when wearing neoprene braces. However, pain increases when braces are off.  Pt reports that R hand throbs when wearing R resting hand splint (but throbs some without).--pt instructed to bring it with him to next session to assess if adjustments are needed.  Pt verbalized understanding of L resting hand splint wear/care after splint was fabricated.    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 ADL;Coordination;GMC;Endurance;UE functional use;Sensation;Body mechanics;Flexibility;IADL;Pain;FMC;Dexterity;Strength;Edema;Mobility;ROM    Rehab Potential Fair   time since onset, severity of pain   Clinical Decision Making Several treatment options, min-mod task modification necessary    Comorbidities  Affecting Occupational Performance: Presence of comorbidities impacting occupational performance  Comorbidities impacting occupational performance description: Lymes disease    Modification or Assistance to Complete Evaluation  Min-Moderate modification of tasks or assist with assess necessary to complete eval    OT Frequency 2x / week    OT Duration --   10 weeks (may only need 8 weeks)   OT Treatment/Interventions Self-care/ADL training;Cryotherapy;Therapeutic exercise;DME and/or AE instruction;Functional Mobility Training;Cognitive remediation/compensation;Ultrasound;Manual Therapy;Splinting;Passive range of motion;Therapeutic activities;Patient/family education;Coping strategies training    Plan check splints and adjust prn, AE/strategies for ADLs    Consulted and Agree with Plan of Care Patient             Patient will benefit from skilled therapeutic intervention in order to improve the following deficits and impairments:   Body Structure / Function / Physical Skills: ADL, Coordination, GMC, Endurance, UE functional use, Sensation, Body mechanics, Flexibility, IADL, Pain, FMC, Dexterity, Strength, Edema, Mobility, ROM       Visit Diagnosis: Pain in right hand  Pain in left hand  Stiffness of left hand, not elsewhere classified  Stiffness of right hand, not elsewhere classified  Muscle weakness (generalized)  Other lack of coordination    Problem List Patient Active Problem List   Diagnosis Date Noted   Other spondylosis with myelopathy, cervical region 02/02/2021   Low back pain 01/19/2021   Swelling of both hands 12/22/2020   Neck pain on left side 09/30/2020   Gait abnormality 09/30/2020   Left-sided weakness 09/01/2020   History of Lyme disease 09/01/2020   Abdominal pain, chronic, epigastric 08/18/2020   Unintentional weight loss 08/18/2020   Chronic constipation 08/18/2020   History of pancreatitis 08/18/2020   Family history of colon cancer 08/18/2020    Sinusitis 06/07/2020   Pancreatitis 05/21/2020   Cervical radiculopathy 05/14/2020   Rash and other nonspecific skin eruption 01/09/2020   Essential hypertension 01/03/2020   Vitamin D deficiency 11/26/2019   Hyperglycemia 11/26/2019   Tobacco use 10/11/2019   Hyperlipemia    Fatty liver 05/06/2019   S/P lumbar fusion 11/15/2017   Labral tear of shoulder, degenerative, left 02/21/2017   Elevated LFTs 01/09/2017   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 [place], [place], [address] Phone: [phone]   Fax:  [phone]  Name: [name] MRN: [id number] Date of Birth: 1971/02/18  Willa Frater, OTR/L [facility] [address] [place], [place]  [address] [phone] phone [phone] 04/08/21 10:25 AM

## 2021-04-09 ENCOUNTER — Encounter: Payer: Self-pay | Admitting: Family Medicine

## 2021-04-09 NOTE — Telephone Encounter (Signed)
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 I would most likely recommend steroid injection at the thumb joint as my next step if he is interested we can schedule for that whenever.

## 2021-04-09 NOTE — Telephone Encounter (Signed)
Patient scheduled for 04/12/2021 at 3:40 pm.

## 2021-04-11 NOTE — Progress Notes (Signed)
Office Visit Note  Patient: [name]             Date of Birth: June 13, 1970           MRN: [id number]             PCP: Lavonda Jumbo, DO Referring: Lavonda Jumbo, DO Visit Date: 04/12/2021   Subjective:  Pain of the Right Thumb and Pain of the Left Thumb   History of Present Illness: [name] is a 50 y.o. male here for follow up due to increased pain and difficulty using his left thumb with frequent locking up and severely worse right thumb pain as well. He does not recall a specific starting incident with this joint pain but now has severe pain especially with pinching and gripping motions. He did not see significantly increased swelling.  Previous HPI 02/02/21 [name] is a 50 y.o. 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 Since our visit he underwent the cervical spine decompression surgery and is still recovering from this with sutures and dermabond in place and c-collar. He notices a big improvement in strength and mobility of his hands, although still soreness and his thumb remains painful and limited in extension and grip strength on the right. He has had some ongoing urticaria symptoms along his left side and axillary area.    Previous HPI 01/19/21 [name] is a 50 y.o. male here for chronic pain and swelling of bilateral hands. He has had joint pains and inflammation since at least 2018 when he was evaluated with [facility] including rheumatology and infectious disease workups. These were broadly negative for numerous serology including RF, CCP, ANA, and B. Burgdorferi Abs. He completed treatments with ceftriaxone and doxycycline at the time and no ongoing infectious concerns described by ID clinic. Rheumatology discussion of possible trying  conventional DMARD in additional to chronic NSAID use. He has had ongoing subsequent orthopedics care with numerous degenerative and postraumatic changes in his cervical and lumbar spine and shoulders and previous SI joint fusion.  He was scheduled for anterior cervical spine decompression surgery urgency planned for tomorrow but apparently being postponed due to abnormal clotting factor test. Currently his worst complaint is increasing pain swelling and stiffness affecting his bilateral hands and wrist severely limiting his activities.  Pain is worst on the thumb and around the MCP joints right hand worse than left.  He has trouble gripping objects tightly and when he uses his hands a lot during the day will experience worsening symptoms for up to 3 days afterwards.  Occasionally there is redness overlying the affected joints when they are in exacerbation but otherwise often appears normal.  He is also developed difficulty fully extending his fingers especially the fourth and fifth fingers of his left hand.  His thumb catches in a fixed position on the right side requiring use of his other hand or an object to restore motion.     Review of Systems  Constitutional:  Positive for fatigue.  HENT:  Positive for mouth dryness.   Eyes:  Positive for dryness.  Respiratory:  Negative for shortness of breath.   Cardiovascular:  Negative for swelling in legs/feet.  Gastrointestinal:  Positive for diarrhea.  Endocrine: Positive for excessive thirst and increased urination.  Genitourinary:  Negative  for difficulty urinating.  Musculoskeletal:  Positive for joint pain, gait problem, joint pain, joint swelling, muscle weakness, morning stiffness and muscle tenderness.  Skin:  Positive for rash.  Allergic/Immunologic: Positive for susceptible to infections.  Neurological:  Positive for numbness and weakness.  Hematological:  Positive for bruising/bleeding tendency.  Psychiatric/Behavioral:  Positive for sleep  disturbance.    PMFS History:  Patient Active Problem List   Diagnosis Date Noted   Pain of right thumb 04/12/2021   Other spondylosis with myelopathy, cervical region 02/02/2021   Low back pain 01/19/2021   Swelling of both hands 12/22/2020   Neck pain on left side 09/30/2020   Gait abnormality 09/30/2020   Left-sided weakness 09/01/2020   History of Lyme disease 09/01/2020   Abdominal pain, chronic, epigastric 08/18/2020   Unintentional weight loss 08/18/2020   Chronic constipation 08/18/2020   History of pancreatitis 08/18/2020   Family history of colon cancer 08/18/2020   Sinusitis 06/07/2020   Pancreatitis 05/21/2020   Cervical radiculopathy 05/14/2020   Rash and other nonspecific skin eruption 01/09/2020   Essential hypertension 01/03/2020   Vitamin D deficiency 11/26/2019   Hyperglycemia 11/26/2019   Tobacco use 10/11/2019   Hyperlipemia    Fatty liver 05/06/2019   S/P lumbar fusion 11/15/2017   Labral tear of shoulder, degenerative, left 02/21/2017   Elevated LFTs 01/09/2017   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    Past Medical History:  Diagnosis Date   Angio-edema    Anxiety    Disseminated Lyme disease    Dystonia    Foot drop, right 2000   Headache    Hypertension 2019   Lyme disease    Neck pain    Rocky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    Urticaria     Family History  Problem Relation Age of Onset   Heart disease Mother    Heart disease Father    Colon polyps Father    ALS Sister    Osteoarthritis Brother    Colon cancer Paternal Grandfather        dx at age 43   Healthy Son    Healthy Son    Esophageal cancer Neg Hx    Inflammatory bowel disease Neg Hx    Liver disease Neg Hx    Pancreatic cancer Neg Hx    Stomach cancer Neg Hx    Past Surgical History:  Procedure Laterality Date   ADENOIDECTOMY     ANTERIOR CERVICAL DECOMPRESSION/DISCECTOMY FUSION 4 LEVELS N/A 01/20/2021   Procedure: ANTERIOR CERVICAL DECOMPRESSION/DISCECTOMY FUSION  3  LEVELS C5-T1;  Surgeon: Venita Lick, MD;  Location: [facility] OR;  Service: Orthopedics;  Laterality: N/A;   BACK SURGERY     SACROILIAC JOINT FUSION Right 11/15/2017   Procedure: SACROILIAC JOINT FUSION;  Surgeon: Venita Lick, MD;  Location: [facility] OR;  Service: Orthopedics;  Laterality: Right;  120 mins   SACROILIAC JOINT FUSION Left 07/25/2018   Procedure: SACROILIAC JOINT FUSION;  Surgeon: Venita Lick, MD;  Location: [facility] OR;  Service: Orthopedics;  Laterality: Left;  SACROILIAC JOINT FUSION   TONSILLECTOMY     50 years old   Social History   Social History Narrative   Lives at home with his wife.   Right-handed.   Caffeine use: two cans of [facility].   Immunization History  Administered Date(s) Administered   Influenza Split 01/09/2017   Influenza,inj,quad, With Preservative 01/09/2017   Tdap 11/16/2004     Objective: Vital Signs: BP 136/77 (BP Location: Left Arm, Patient Position: Sitting,  Cuff Size: Normal)   Pulse 85   Resp 16   Ht 6' (1.829 m)   Wt 245 lb (111.1 kg)   BMI 33.23 kg/m    Physical Exam Constitutional:      Appearance: He is obese.  Skin:    General: Skin is warm and dry.     Findings: No rash.  Neurological:     Mental Status: He is alert.  Psychiatric:        Mood and Affect: Mood normal.     Musculoskeletal Exam:  Severe tenderness to palpation around right first [facility] joint slight extension to radial aspect of wrist and towards MCP joint, limited ultrasound inspection demonstrates joint swelling around the first [facility] joint and osteoarthritis changes no other significant inflammation or synovitis visualized   Investigation: No additional findings.  Imaging: No results found.  Recent Labs: Lab Results  Component Value Date   WBC 9.8 01/12/2021   HGB 14.8 01/12/2021   PLT 201 01/12/2021   NA 139 01/12/2021   K 3.5 01/12/2021   CL 106 01/12/2021   CO2 24 01/12/2021   GLUCOSE 132 (H) 01/12/2021   BUN 12 01/12/2021   CREATININE 1.02  01/12/2021   BILITOT 0.3 06/08/2020   ALKPHOS 62 06/08/2020   AST 22 06/08/2020   ALT 17 06/08/2020   PROT 6.9 01/19/2021   ALBUMIN 4.4 06/08/2020   CALCIUM 9.3 01/12/2021   GFRAA 112 06/08/2020    Speciality Comments: No specialty comments available.  Procedures:  Small Joint Inj: R thumb CMC on 04/12/2021 4:20 PM Indications: pain Details: 25 G needle, radial approach Medications: 0.5 mL lidocaine 1 %; 20 mg triamcinolone acetonide 40 MG/ML Procedure, treatment alternatives, risks and benefits explained, specific risks discussed. Consent was given by the patient. Immediately prior to procedure a time out was called to verify the correct patient, procedure, equipment, support staff and site/side marked as required. Patient was prepped and draped in the usual sterile fashion.    Allergies: Pregabalin, Sulfa antibiotics, and Gabapentin   Assessment / Plan:     Visit Diagnoses: Pain of right thumb  Thumb pain appears very localized to the [facility] joint based on exam and ultrasound findings probably related to the underlying osteoarthritis at this joint previously demonstrated on hand x-rays.  Local steroid injection performed in the base of the thumb today.  I recommended seeing how much improvement or not this gave his symptoms before considering trial of left thumb joint injection or repeating procedure on this after a few months.  Continuing OT treatments.  Orders: No orders of the defined types were placed in this encounter.  No orders of the defined types were placed in this encounter.    Follow-Up Instructions: No follow-ups on file.   Fuller Plan, MD  Note - This record has been created using [employer].  Chart creation errors have been sought, but may not always  have been located. Such creation errors do not reflect on  the standard of medical care.

## 2021-04-12 ENCOUNTER — Other Ambulatory Visit: Payer: Self-pay

## 2021-04-12 ENCOUNTER — Ambulatory Visit (INDEPENDENT_AMBULATORY_CARE_PROVIDER_SITE_OTHER): Payer: 59 | Admitting: Internal Medicine

## 2021-04-12 ENCOUNTER — Encounter: Payer: Self-pay | Admitting: Internal Medicine

## 2021-04-12 VITALS — BP 136/77 | HR 85 | Resp 16 | Ht 72.0 in | Wt 245.0 lb

## 2021-04-12 DIAGNOSIS — M79644 Pain in right finger(s): Secondary | ICD-10-CM | POA: Diagnosis not present

## 2021-04-12 DIAGNOSIS — M79645 Pain in left finger(s): Secondary | ICD-10-CM | POA: Diagnosis not present

## 2021-04-12 MED ORDER — LIDOCAINE HCL 1 % IJ SOLN
0.5000 mL | INTRAMUSCULAR | Status: AC | PRN
  Administered 2021-04-12: .5 mL

## 2021-04-12 MED ORDER — TRIAMCINOLONE ACETONIDE 40 MG/ML IJ SUSP
20.0000 mg | INTRAMUSCULAR | Status: AC | PRN
  Administered 2021-04-12: 20 mg via INTRA_ARTICULAR

## 2021-04-12 NOTE — Addendum Note (Signed)
Addended by: Fuller Plan on: 04/12/2021 10:36 PM   Modules accepted: Level of Service

## 2021-04-13 ENCOUNTER — Ambulatory Visit: Payer: 59 | Admitting: Occupational Therapy

## 2021-04-15 ENCOUNTER — Other Ambulatory Visit: Payer: Self-pay

## 2021-04-15 ENCOUNTER — Ambulatory Visit: Payer: 59 | Admitting: Occupational Therapy

## 2021-04-15 ENCOUNTER — Encounter: Payer: Self-pay | Admitting: Occupational Therapy

## 2021-04-15 DIAGNOSIS — M25641 Stiffness of right hand, not elsewhere classified: Secondary | ICD-10-CM

## 2021-04-15 DIAGNOSIS — R278 Other lack of coordination: Secondary | ICD-10-CM

## 2021-04-15 DIAGNOSIS — M79641 Pain in right hand: Secondary | ICD-10-CM

## 2021-04-15 DIAGNOSIS — M79642 Pain in left hand: Secondary | ICD-10-CM

## 2021-04-15 DIAGNOSIS — M25642 Stiffness of left hand, not elsewhere classified: Secondary | ICD-10-CM

## 2021-04-15 DIAGNOSIS — M6281 Muscle weakness (generalized): Secondary | ICD-10-CM

## 2021-04-15 NOTE — Therapy (Signed)
[facility] [address] [place], [place], [address] Phone: [phone]   Fax:  [phone]  Occupational Therapy Treatment  Patient Details  Name: [name] MRN: [id number] Date of Birth: 1971-03-09 Referring Provider (OT): Dr. Sheliah Hatch   Encounter Date: 04/15/2021   OT End of Session - 04/15/21 1329     Visit Number 4    Number of Visits 20    Date for OT Re-Evaluation 06/12/21    Authorization Type [employer]; covered 100%    OT Start Time 1230    OT Stop Time 1315    OT Time Calculation (min) 45 min    Activity Tolerance Patient limited by pain    Behavior During Therapy [facility] for tasks assessed/performed             Past Medical History:  Diagnosis Date   Angio-edema    Anxiety    Disseminated Lyme disease    Dystonia    Foot drop, right 2000   Headache    Hypertension 2019   Lyme disease    Neck pain    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    Urticaria     Past Surgical History:  Procedure Laterality Date   ADENOIDECTOMY     ANTERIOR CERVICAL DECOMPRESSION/DISCECTOMY FUSION 4 LEVELS N/A 01/20/2021   Procedure: ANTERIOR CERVICAL DECOMPRESSION/DISCECTOMY FUSION 3  LEVELS C5-T1;  Surgeon: Venita Lick, MD;  Location: [facility] OR;  Service: Orthopedics;  Laterality: N/A;   BACK SURGERY     SACROILIAC JOINT FUSION Right 11/15/2017   Procedure: SACROILIAC JOINT FUSION;  Surgeon: Venita Lick, MD;  Location: [facility] OR;  Service: Orthopedics;  Laterality: Right;  120 mins   SACROILIAC JOINT FUSION Left 07/25/2018   Procedure: SACROILIAC JOINT FUSION;  Surgeon: Venita Lick, MD;  Location: [facility] OR;  Service: Orthopedics;  Laterality: Left;  SACROILIAC JOINT FUSION   TONSILLECTOMY     50 years old    There were no vitals filed for this visit.   Subjective Assessment - 04/15/21 1235     Subjective  Pt reports that he had 3 injections in R thumb Monday and did 2 trigger point injections in neck  yesterday.  Pt reports that he is able to wear resting hand splints for 3/4 of the night, CMC splints help.  Pt reports he was diagnosed with trigger fingers L hand as well.    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 Yes    Pain Score 4     Pain Location Hand    Pain Orientation Right;Left    Pain Descriptors / Indicators Throbbing;Sore;Sharp    Pain Type Chronic pain    Pain Onset More than a month ago    Pain Frequency Constant    Aggravating Factors  use    Pain Relieving Factors pain medications             Ultrasound x3min to L forearm due to tightness and knots palpitated , 20% pulsed, 0.8wts/cm2 with no adverse reactions then x32min (same parameters) to L palm/hypothenar eminence.    Manual therapy:  soft tissue mobs (to L finger/wrist/thumb flexors), passive stretch (wrist ext, thumb abduction, supination, finger ext, palm), and gentle joint mobs (to wrist).  Pt shown how to perform gentle wt. Bearing on table with L hand over towel roll for stretch.            OT Education - 04/15/21 1332     Education Details Composite wrist/finger ext stretch--see pt instructions    Person(s) Educated Patient    Methods Explanation;Demonstration;Verbal cues;Handout    Comprehension Verbalized understanding;Returned demonstration              OT Short Term Goals - 04/06/21 1414       OT SHORT TERM GOAL #1   Title Independent with daytime and pm bilateral splints to improve positioning, function, and pain    Time 4    Period Weeks    Status On-going      OT SHORT TERM GOAL #2   Title Independent with initial HEP    Time 4    Period Weeks    Status On-going      OT SHORT TERM GOAL #3   Title Pt to verbalize understanding with A/E  recommendations and task modifications to increase independence with ADLS (including cutting food, writing, dressing/buttons & belt, bathing, etc)    Time 4    Period Weeks    Status New               OT Long Term Goals - 03/31/21 1454       OT LONG TERM GOAL #1   Title Pt independent with updated HEP    Time 8    Period Weeks    Status New      OT LONG TERM GOAL #2   Title Pt to improve BUE hand function as evidenced by increasing Box & Blocks score by 6    Baseline Rt = 17, Lt = 16    Time 8    Period Weeks    Status New      OT LONG TERM GOAL #3   Title Pt overall mod assist or less for BADLS using A/E prn    Time 8    Period Weeks    Status New      OT LONG TERM GOAL #4   Title Pt to report pain 5/10 or under bilateral thumbs    Baseline 8/10    Time 8    Period Weeks    Status New      OT LONG TERM GOAL #5   Title Pt to demo 75% composite extension Lt hand in prep for functional tasks/grasping    Time 8    Period Weeks    Status New                   Plan - 04/15/21 1330     Clinical Impression Statement Pt reports that splints help.  Pt reports recent injections R thumb.  Noted muscle tightness and knots in L forearm (flexors) and palm today.  However, pt demo incr ROM and decr tightness after ultrasound and manual therapy today.    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 ADL;Coordination;GMC;Endurance;UE functional use;Sensation;Body mechanics;Flexibility;IADL;Pain;FMC;Dexterity;Strength;Edema;Mobility;ROM    Rehab Potential Fair   time since onset, severity of pain   Clinical Decision Making Several treatment options, min-mod task  modification necessary    Comorbidities Affecting Occupational Performance:  Presence of comorbidities impacting occupational performance    Comorbidities impacting occupational performance description: Lymes disease    Modification or Assistance to Complete Evaluation  Min-Moderate modification of tasks or assist with assess necessary to complete eval    OT Frequency 2x / week    OT Duration --   10 weeks (may only need 8 weeks)   OT Treatment/Interventions Self-care/ADL training;Cryotherapy;Therapeutic exercise;DME and/or AE instruction;Functional Mobility Training;Cognitive remediation/compensation;Ultrasound;Manual Therapy;Splinting;Passive range of motion;Therapeutic activities;Patient/family education;Coping strategies training    Plan check splints and adjust prn, AE/strategies for ADLs, continue with ultrasound    Consulted and Agree with Plan of Care Patient             Patient will benefit from skilled therapeutic intervention in order to improve the following deficits and impairments:   Body Structure / Function / Physical Skills: ADL, Coordination, GMC, Endurance, UE functional use, Sensation, Body mechanics, Flexibility, IADL, Pain, FMC, Dexterity, Strength, Edema, Mobility, ROM       Visit Diagnosis: Pain in right hand  Pain in left hand  Stiffness of left hand, not elsewhere classified  Stiffness of right hand, not elsewhere classified  Muscle weakness (generalized)  Other lack of coordination    Problem List Patient Active Problem List   Diagnosis Date Noted   Pain of right thumb 04/12/2021   Other spondylosis with myelopathy, cervical region 02/02/2021   Low back pain 01/19/2021   Swelling of both hands 12/22/2020   Neck pain on left side 09/30/2020   Gait abnormality 09/30/2020   Left-sided weakness 09/01/2020   History of Lyme disease 09/01/2020   Abdominal pain, chronic, epigastric 08/18/2020   Unintentional weight loss 08/18/2020   Chronic  constipation 08/18/2020   History of pancreatitis 08/18/2020   Family history of colon cancer 08/18/2020   Sinusitis 06/07/2020   Pancreatitis 05/21/2020   Cervical radiculopathy 05/14/2020   Rash and other nonspecific skin eruption 01/09/2020   Essential hypertension 01/03/2020   Vitamin D deficiency 11/26/2019   Hyperglycemia 11/26/2019   Tobacco use 10/11/2019   Hyperlipemia    Fatty liver 05/06/2019   S/P lumbar fusion 11/15/2017   Labral tear of shoulder, degenerative, left 02/21/2017   Elevated LFTs 01/09/2017   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 [place], [place], [address] Phone: [phone]   Fax:  [phone]  Name: TAGGART PRASAD Chen MRN: [id number] Date of Birth: Apr 24, 1971  Willa Frater, OTR/L [facility] [address] [place], [place]  [address] [phone] phone [phone] 04/15/21 1:40 PM

## 2021-04-15 NOTE — Patient Instructions (Signed)
    Composite Extension (Passive Flexor Stretch)    Sitting with elbows on table and palms together, slowly lower wrists toward table until stretch is felt. Be sure to keep palms together throughout stretch. Hold 20 seconds. Relax. Repeat 5 times. Do 3 sessions per day.

## 2021-04-19 ENCOUNTER — Other Ambulatory Visit: Payer: Self-pay

## 2021-04-19 ENCOUNTER — Ambulatory Visit: Payer: 59 | Admitting: Occupational Therapy

## 2021-04-19 DIAGNOSIS — M79641 Pain in right hand: Secondary | ICD-10-CM

## 2021-04-19 DIAGNOSIS — M79642 Pain in left hand: Secondary | ICD-10-CM

## 2021-04-19 DIAGNOSIS — M6281 Muscle weakness (generalized): Secondary | ICD-10-CM

## 2021-04-19 DIAGNOSIS — M25641 Stiffness of right hand, not elsewhere classified: Secondary | ICD-10-CM

## 2021-04-19 DIAGNOSIS — M25642 Stiffness of left hand, not elsewhere classified: Secondary | ICD-10-CM

## 2021-04-19 NOTE — Therapy (Signed)
[facility] [address] [place], [place], [address] Phone: [phone]   Fax:  [phone]  Occupational Therapy Treatment  Patient Details  Name: [name] MRN: [id number] Date of Birth: 1971-02-01 Referring Provider (OT): Dr. Vernelle Emerald   Encounter Date: 04/19/2021   OT End of Session - 04/19/21 1417     Visit Number 5    Number of Visits 20    Date for OT Re-Evaluation 06/12/21    Authorization Type [employer]; covered 100%    OT Start Time 1230    OT Stop Time 1315    OT Time Calculation (min) 45 min    Activity Tolerance Patient limited by pain    Behavior During Therapy [facility] for tasks assessed/performed             Past Medical History:  Diagnosis Date   Angio-edema    Anxiety    Disseminated Lyme disease    Dystonia    Foot drop, right 2000   Headache    Hypertension 2019   Lyme disease    Neck pain    Rocky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    Urticaria     Past Surgical History:  Procedure Laterality Date   ADENOIDECTOMY     ANTERIOR CERVICAL DECOMPRESSION/DISCECTOMY FUSION 4 LEVELS N/A 01/20/2021   Procedure: ANTERIOR CERVICAL DECOMPRESSION/DISCECTOMY FUSION 3  LEVELS C5-T1;  Surgeon: Melina Schools, MD;  [facility];  Service: Orthopedics;  Laterality: N/A;   BACK SURGERY     SACROILIAC JOINT FUSION Right 11/15/2017   Procedure: SACROILIAC JOINT FUSION;  Surgeon: Melina Schools, MD;  Location: [place];  Service: Orthopedics;  Laterality: Right;  120 mins   SACROILIAC JOINT FUSION Left 07/25/2018   Procedure: SACROILIAC JOINT FUSION;  Surgeon: Melina Schools, MD;  Location: [place];  Service: Orthopedics;  Laterality: Left;  SACROILIAC JOINT FUSION   TONSILLECTOMY     50 years old    There were no vitals filed for this visit.   Subjective Assessment - 04/19/21 1237     Subjective  Pt reports that he had 3 injections in R thumb last Monday and did 2 trigger point injections in  neck. 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) w/ ACDF 01/20/21 c5-t1, SI joint fusions Rt 2019, Lt 2020    Patient Stated Goals to use my thumbs and hands better with less pain    Pain Score 4     Pain Location Hand    Pain Orientation Right;Left    Pain Descriptors / Indicators Aching    Pain Type Chronic pain    Pain Onset More than a month ago    Aggravating Factors  use    Pain Relieving Factors injections, neoprene braces               Ultrasound x 6 min volar palm and thenar eminence Lt hand due to tightness (20% pulsed, 3 Mhz, 0.8 wts/cm2) Followed by soft tissue mobs to Lt thumb and stretches to Lt thumb in radial and palmer abd, and passive stretch in finger extension.   Reviewed wt bearing over table  Pt issued neoprene brace for Rt thumb - forearm based for greater comfort, and reviewed wear and care.   Began discussing A/E needs including use of rocker knife or pizza cutter for cutting food, button hook  for hooking buttons, adaptive pen for writing (pt did best w/ Pen Again). Pt practiced all tasks w/ AE. Pt also has LH sponge for bathing.                      OT Short Term Goals - 04/19/21 1417       OT SHORT TERM GOAL #1   Title Independent with daytime and pm bilateral splints to improve positioning, function, and pain    Time 4    Period Weeks    Status Achieved      OT SHORT TERM GOAL #2   Title Independent with initial HEP    Time 4    Period Weeks    Status Achieved      OT SHORT TERM GOAL #3   Title Pt to verbalize understanding with A/E recommendations and task modifications to increase independence with ADLS (including cutting food, writing, dressing/buttons & belt, bathing, etc)    Time 4    Period Weeks    Status On-going               OT Long Term Goals - 03/31/21 1454        OT LONG TERM GOAL #1   Title Pt independent with updated HEP    Time 8    Period Weeks    Status New      OT LONG TERM GOAL #2   Title Pt to improve BUE hand function as evidenced by increasing Box & Blocks score by 6    Baseline Rt = 17, Lt = 16    Time 8    Period Weeks    Status New      OT LONG TERM GOAL #3   Title Pt overall mod assist or less for BADLS using A/E prn    Time 8    Period Weeks    Status New      OT LONG TERM GOAL #4   Title Pt to report pain 5/10 or under bilateral thumbs    Baseline 8/10    Time 8    Period Weeks    Status New      OT LONG TERM GOAL #5   Title Pt to demo 75% composite extension Lt hand in prep for functional tasks/grasping    Time 8    Period Weeks    Status New                   Plan - 04/19/21 1417     Clinical Impression Statement Pt has met first 2 STG's and progressing towards STG #3. Pt overall with less pain in thumbs - injections and braces/splints both helping and [place] helping Lt hand    OT Occupational Profile and History Detailed Assessment- Review of Records and additional review of physical, cognitive, psychosocial history related to current functional performance    Occupational performance deficits (Please refer to evaluation for details): ADL's;IADL's;Leisure;Social Participation;Work    Marketing executive / Function / Physical Skills ADL;Coordination;GMC;Endurance;UE functional use;Sensation;Body mechanics;Flexibility;IADL;Pain;FMC;Dexterity;Strength;Edema;Mobility;ROM    Rehab Potential Fair   time since onset, severity of pain   Clinical Decision Making Several treatment options, min-mod task modification necessary    Comorbidities Affecting Occupational Performance: Presence of comorbidities impacting occupational performance    Comorbidities impacting occupational performance description: Lymes disease    Modification or Assistance to Complete Evaluation  Min-Moderate modification of tasks or assist  with assess necessary to complete eval    OT  Frequency 2x / week    OT Duration --   10 weeks (may only need 8 weeks)   OT Treatment/Interventions Self-care/ADL training;Cryotherapy;Therapeutic exercise;DME and/or AE instruction;Functional Mobility Training;Cognitive remediation/compensation;Ultrasound;Manual Therapy;Splinting;Passive range of motion;Therapeutic activities;Patient/family education;Coping strategies training    Plan continue [place] for Lt palm, issue A/E handouts discussed today, continue A/E exploration prn, functional use of hands    Consulted and Agree with Plan of Care Patient             Patient will benefit from skilled therapeutic intervention in order to improve the following deficits and impairments:   Body Structure / Function / Physical Skills: ADL, Coordination, GMC, Endurance, UE functional use, Sensation, Body mechanics, Flexibility, IADL, Pain, FMC, Dexterity, Strength, Edema, Mobility, ROM       Visit Diagnosis: Pain in right hand  Pain in left hand  Stiffness of left hand, not elsewhere classified  Stiffness of right hand, not elsewhere classified  Muscle weakness (generalized)    Problem List Patient Active Problem List   Diagnosis Date Noted   Pain of right thumb 04/12/2021   Other spondylosis with myelopathy, cervical region 02/02/2021   Low back pain 01/19/2021   Swelling of both hands 12/22/2020   Neck pain on left side 09/30/2020   Gait abnormality 09/30/2020   Left-sided weakness 09/01/2020   History of Lyme disease 09/01/2020   Abdominal pain, chronic, epigastric 08/18/2020   Unintentional weight loss 08/18/2020   Chronic constipation 08/18/2020   History of pancreatitis 08/18/2020   Family history of colon cancer 08/18/2020   Sinusitis 06/07/2020   Pancreatitis 05/21/2020   Cervical radiculopathy 05/14/2020   Rash and other nonspecific skin eruption 01/09/2020   Essential hypertension 01/03/2020   Vitamin D deficiency  11/26/2019   Hyperglycemia 11/26/2019   Tobacco use 10/11/2019   Hyperlipemia    Fatty liver 05/06/2019   S/P lumbar fusion 11/15/2017   Labral tear of shoulder, degenerative, left 02/21/2017   Elevated LFTs 01/09/2017   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 [place], [place], [address] Phone: [phone]   Fax:  [phone]  Name: [name] MRN: [id number] Date of Birth: Nov 25, 1970

## 2021-04-20 ENCOUNTER — Ambulatory Visit: Payer: 59 | Admitting: Occupational Therapy

## 2021-04-20 DIAGNOSIS — M25642 Stiffness of left hand, not elsewhere classified: Secondary | ICD-10-CM

## 2021-04-20 DIAGNOSIS — M25641 Stiffness of right hand, not elsewhere classified: Secondary | ICD-10-CM

## 2021-04-20 DIAGNOSIS — M79642 Pain in left hand: Secondary | ICD-10-CM

## 2021-04-20 DIAGNOSIS — M79641 Pain in right hand: Secondary | ICD-10-CM | POA: Diagnosis not present

## 2021-04-20 NOTE — Therapy (Signed)
[facility] [address] [place], [place], [address] Phone: [phone]   Fax:  [phone]  Occupational Therapy Treatment  Patient Details  Name: [name] MRN: [id number] Date of Birth: 1970-11-06 Referring Provider (OT): Dr. Vernelle Emerald   Encounter Date: 04/20/2021   OT End of Session - 04/20/21 1411     Visit Number 6    Number of Visits 20    Date for OT Re-Evaluation 06/12/21    Authorization Type [employer]; covered 100%    OT Start Time 1230    OT Stop Time 1315    OT Time Calculation (min) 45 min    Activity Tolerance Patient limited by pain    Behavior During Therapy [facility] for tasks assessed/performed             Past Medical History:  Diagnosis Date   Angio-edema    Anxiety    Disseminated Lyme disease    Dystonia    Foot drop, right 2000   Headache    Hypertension 2019   Lyme disease    Neck pain    Rocky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    Urticaria     Past Surgical History:  Procedure Laterality Date   ADENOIDECTOMY     ANTERIOR CERVICAL DECOMPRESSION/DISCECTOMY FUSION 4 LEVELS N/A 01/20/2021   Procedure: ANTERIOR CERVICAL DECOMPRESSION/DISCECTOMY FUSION 3  LEVELS C5-T1;  Surgeon: Melina Schools, MD;  Location: [facility];  Service: Orthopedics;  Laterality: N/A;   BACK SURGERY     SACROILIAC JOINT FUSION Right 11/15/2017   Procedure: SACROILIAC JOINT FUSION;  Surgeon: Melina Schools, MD;  Location: [facility];  Service: Orthopedics;  Laterality: Right;  120 mins   SACROILIAC JOINT FUSION Left 07/25/2018   Procedure: SACROILIAC JOINT FUSION;  Surgeon: Melina Schools, MD;  Location: [facility];  Service: Orthopedics;  Laterality: Left;  SACROILIAC JOINT FUSION   TONSILLECTOMY     50 years old    There were no vitals filed for this visit.   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 Yes    Pain Score 5     Pain Location Hand   Lt thumb > Rt   Pain Orientation Right;Left    Pain Descriptors / Indicators Aching    Pain Type Chronic pain    Pain Onset More than a month ago    Pain Frequency Constant    Aggravating Factors  use    Pain Relieving Factors injections, neoprene braces             Ultrasound x 8 min Lt volar forearm (20% pulsed, 3 Mhz, 1.0 wts/cm2), followed by 5 min in Lt palm same parameters except 0.8 wts/cm2 = 13 min total  NMES x 10 min LUE for wrist/finger extension and thumb abduction.   Functional use Rt hand - picking up and stacking cones, cups, and picking up checkers and placing into Connect 4 slots.   Issued A/E handouts discussed last session                       OT Short Term Goals - 04/19/21 1417       OT SHORT TERM GOAL #  1   Title Independent with daytime and pm bilateral splints to improve positioning, function, and pain    Time 4    Period Weeks    Status Achieved      OT SHORT TERM GOAL #2   Title Independent with initial HEP    Time 4    Period Weeks    Status Achieved      OT SHORT TERM GOAL #3   Title Pt to verbalize understanding with A/E recommendations and task modifications to increase independence with ADLS (including cutting food, writing, dressing/buttons & belt, bathing, etc)    Time 4    Period Weeks    Status On-going               OT Long Term Goals - 03/31/21 1454       OT LONG TERM GOAL #1   Title Pt independent with updated HEP    Time 8    Period Weeks    Status New      OT LONG TERM GOAL #2   Title Pt to improve BUE hand function as evidenced by increasing Box & Blocks score by 6    Baseline Rt = 17, Lt = 16    Time 8    Period Weeks    Status New      OT LONG TERM GOAL #3   Title Pt overall mod assist or  less for BADLS using A/E prn    Time 8    Period Weeks    Status New      OT LONG TERM GOAL #4   Title Pt to report pain 5/10 or under bilateral thumbs    Baseline 8/10    Time 8    Period Weeks    Status New      OT LONG TERM GOAL #5   Title Pt to demo 75% composite extension Lt hand in prep for functional tasks/grasping    Time 8    Period Weeks    Status New                   Plan - 04/20/21 1411     Clinical Impression Statement Pt has met first 2 STG's and progressing towards STG #3. Pt overall with less pain in thumbs - injections and braces/splints both helping and [place] helping Lt hand    OT Occupational Profile and History Detailed Assessment- Review of Records and additional review of physical, cognitive, psychosocial history related to current functional performance    Occupational performance deficits (Please refer to evaluation for details): ADL's;IADL's;Leisure;Social Participation;Work    Marketing executive / Function / Physical Skills ADL;Coordination;GMC;Endurance;UE functional use;Sensation;Body mechanics;Flexibility;IADL;Pain;FMC;Dexterity;Strength;Edema;Mobility;ROM    Rehab Potential Fair   time since onset, severity of pain   Clinical Decision Making Several treatment options, min-mod task modification necessary    Comorbidities Affecting Occupational Performance: Presence of comorbidities impacting occupational performance    Comorbidities impacting occupational performance description: Lymes disease    Modification or Assistance to Complete Evaluation  Min-Moderate modification of tasks or assist with assess necessary to complete eval    OT Frequency 2x / week    OT Duration --   10 weeks (may only need 8 weeks)   OT Treatment/Interventions Self-care/ADL training;Cryotherapy;Therapeutic exercise;DME and/or AE instruction;Functional Mobility Training;Cognitive remediation/compensation;Ultrasound;Manual Therapy;Splinting;Passive range of motion;Therapeutic  activities;Patient/family education;Coping strategies training    Plan continue [place] for Lt palm and estim for wrist and finger ext, continue A/E exploration prn, functional use of Rt  hand    Consulted and Agree with Plan of Care Patient             Patient will benefit from skilled therapeutic intervention in order to improve the following deficits and impairments:   Body Structure / Function / Physical Skills: ADL, Coordination, GMC, Endurance, UE functional use, Sensation, Body mechanics, Flexibility, IADL, Pain, FMC, Dexterity, Strength, Edema, Mobility, ROM       Visit Diagnosis: Pain in right hand  Pain in left hand  Stiffness of left hand, not elsewhere classified  Stiffness of right hand, not elsewhere classified    Problem List Patient Active Problem List   Diagnosis Date Noted   Pain of right thumb 04/12/2021   Other spondylosis with myelopathy, cervical region 02/02/2021   Low back pain 01/19/2021   Swelling of both hands 12/22/2020   Neck pain on left side 09/30/2020   Gait abnormality 09/30/2020   Left-sided weakness 09/01/2020   History of Lyme disease 09/01/2020   Abdominal pain, chronic, epigastric 08/18/2020   Unintentional weight loss 08/18/2020   Chronic constipation 08/18/2020   History of pancreatitis 08/18/2020   Family history of colon cancer 08/18/2020   Sinusitis 06/07/2020   Pancreatitis 05/21/2020   Cervical radiculopathy 05/14/2020   Rash and other nonspecific skin eruption 01/09/2020   Essential hypertension 01/03/2020   Vitamin D deficiency 11/26/2019   Hyperglycemia 11/26/2019   Tobacco use 10/11/2019   Hyperlipemia    Fatty liver 05/06/2019   S/P lumbar fusion 11/15/2017   Labral tear of shoulder, degenerative, left 02/21/2017   Elevated LFTs 01/09/2017   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 [place], [place], [address] Phone: [phone]   Fax:  [phone]  Name: [name] MRN: [id number] Date of Birth: 1971/05/12

## 2021-04-27 ENCOUNTER — Other Ambulatory Visit: Payer: Self-pay

## 2021-04-27 ENCOUNTER — Ambulatory Visit: Payer: 59 | Admitting: Occupational Therapy

## 2021-04-27 DIAGNOSIS — M6281 Muscle weakness (generalized): Secondary | ICD-10-CM

## 2021-04-27 DIAGNOSIS — M79641 Pain in right hand: Secondary | ICD-10-CM

## 2021-04-27 DIAGNOSIS — M25641 Stiffness of right hand, not elsewhere classified: Secondary | ICD-10-CM

## 2021-04-27 DIAGNOSIS — M25642 Stiffness of left hand, not elsewhere classified: Secondary | ICD-10-CM

## 2021-04-27 DIAGNOSIS — M79642 Pain in left hand: Secondary | ICD-10-CM

## 2021-04-27 NOTE — Therapy (Signed)
[facility] [address] [place], [place], [address] Phone: [phone]   Fax:  [phone]  Occupational Therapy Treatment  Patient Details  Name: [name] MRN: [id number] Date of Birth: [birth year] Referring Provider (OT): Dr. Vernelle Emerald   Encounter Date: 04/27/2021   OT End of Session - 04/27/21 1340     Visit Number 7    Number of Visits 20    Date for OT Re-Evaluation 06/12/21    Authorization Type [employer]; covered 100%    OT Start Time 7035   arrived late - thought it was 1:30   OT Stop Time 1405    OT Time Calculation (min) 30 min    Activity Tolerance Patient limited by pain    Behavior During Therapy [facility] for tasks assessed/performed             Past Medical History:  Diagnosis Date   Angio-edema    Anxiety    Disseminated Lyme disease    Dystonia    Foot drop, right 2000   Headache    Hypertension 2019   Lyme disease    Neck pain    Rocky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    Urticaria     Past Surgical History:  Procedure Laterality Date   ADENOIDECTOMY     ANTERIOR CERVICAL DECOMPRESSION/DISCECTOMY FUSION 4 LEVELS N/A 01/20/2021   Procedure: ANTERIOR CERVICAL DECOMPRESSION/DISCECTOMY FUSION 3  LEVELS C5-T1;  Surgeon: Melina Schools, MD;  Location: [facility];  Service: Orthopedics;  Laterality: N/A;   BACK SURGERY     SACROILIAC JOINT FUSION Right 11/15/2017   Procedure: SACROILIAC JOINT FUSION;  Surgeon: Melina Schools, MD;  Location: [facility];  Service: Orthopedics;  Laterality: Right;  120 mins   SACROILIAC JOINT FUSION Left 07/25/2018   Procedure: SACROILIAC JOINT FUSION;  Surgeon: Melina Schools, MD;  Location: [facility];  Service: Orthopedics;  Laterality: Left;  SACROILIAC JOINT FUSION   TONSILLECTOMY     50 years old    There were no vitals filed for this visit.   Subjective Assessment - 04/27/21 1338     Subjective  Rt thumb is getting better after the injection. 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 Yes    Pain Score 5     Pain Location Hand    Pain Orientation Right;Left    Pain Descriptors / Indicators Aching    Pain Type Chronic pain    Pain Onset More than a month ago    Pain Frequency Constant    Aggravating Factors  use    Pain Relieving Factors injections, neoprene braces              NMES to wrist/finger extensors (previous parameters), Int = 19-21 while simultaneously reviewing A/E needs. Pt shown options for tying shoes and demo use of shoe buttons. Pt likes this option best and told where/how to purchase. Also discussed questions to ask MD re: medical management of pain (muscle relaxer vs. Botox to thumb adducters, and/or possible referral for acupuncture)                      OT Short  Term Goals - 04/19/21 1417       OT SHORT TERM GOAL #1   Title Independent with daytime and pm bilateral splints to improve positioning, function, and pain    Time 4    Period Weeks    Status Achieved      OT SHORT TERM GOAL #2   Title Independent with initial HEP    Time 4    Period Weeks    Status Achieved      OT SHORT TERM GOAL #3   Title Pt to verbalize understanding with A/E recommendations and task modifications to increase independence with ADLS (including cutting food, writing, dressing/buttons & belt, bathing, etc)    Time 4    Period Weeks    Status On-going               OT Long Term Goals - 03/31/21 1454       OT LONG TERM GOAL #1   Title Pt independent with updated HEP    Time 8    Period Weeks    Status New      OT LONG TERM GOAL #2   Title Pt to improve BUE hand function as evidenced by increasing Box & Blocks score by 6     Baseline Rt = 17, Lt = 16    Time 8    Period Weeks    Status New      OT LONG TERM GOAL #3   Title Pt overall mod assist or less for BADLS using A/E prn    Time 8    Period Weeks    Status New      OT LONG TERM GOAL #4   Title Pt to report pain 5/10 or under bilateral thumbs    Baseline 8/10    Time 8    Period Weeks    Status New      OT LONG TERM GOAL #5   Title Pt to demo 75% composite extension Lt hand in prep for functional tasks/grasping    Time 8    Period Weeks    Status New                   Plan - 04/27/21 1416     Clinical Impression Statement Pt has met first 2 STG's and progressing towards STG #3. Pt overall with less pain in thumbs - injections and braces/splints both helping and [place] helping Lt hand. However increased pain Lt thumb today and last few days    OT Occupational Profile and History Detailed Assessment- Review of Records and additional review of physical, cognitive, psychosocial history related to current functional performance    Occupational performance deficits (Please refer to evaluation for details): ADL's;IADL's;Leisure;Social Participation;Work    Marketing executive / Function / Physical Skills ADL;Coordination;GMC;Endurance;UE functional use;Sensation;Body mechanics;Flexibility;IADL;Pain;FMC;Dexterity;Strength;Edema;Mobility;ROM    Rehab Potential Fair   time since onset, severity of pain   Clinical Decision Making Several treatment options, min-mod task modification necessary    Comorbidities Affecting Occupational Performance: Presence of comorbidities impacting occupational performance    Comorbidities impacting occupational performance description: Lymes disease    Modification or Assistance to Complete Evaluation  Min-Moderate modification of tasks or assist with assess necessary to complete eval    OT Frequency 2x / week    OT Duration --   10 weeks (may only need 8 weeks)   OT Treatment/Interventions Self-care/ADL  training;Cryotherapy;Therapeutic exercise;DME and/or AE instruction;Functional Mobility Training;Cognitive remediation/compensation;Ultrasound;Manual Therapy;Splinting;Passive range of motion;Therapeutic activities;Patient/family education;Coping strategies  training    Plan continue [place] for Lt palm and estim for wrist and finger ext, continue A/E exploration prn, functional use of Rt hand    Consulted and Agree with Plan of Care Patient             Patient will benefit from skilled therapeutic intervention in order to improve the following deficits and impairments:   Body Structure / Function / Physical Skills: ADL, Coordination, GMC, Endurance, UE functional use, Sensation, Body mechanics, Flexibility, IADL, Pain, FMC, Dexterity, Strength, Edema, Mobility, ROM       Visit Diagnosis: Pain in right hand  Pain in left hand  Stiffness of left hand, not elsewhere classified  Stiffness of right hand, not elsewhere classified  Muscle weakness (generalized)    Problem List Patient Active Problem List   Diagnosis Date Noted   Pain of right thumb 04/12/2021   Other spondylosis with myelopathy, cervical region 02/02/2021   Low back pain 01/19/2021   Swelling of both hands 12/22/2020   Neck pain on left side 09/30/2020   Gait abnormality 09/30/2020   Left-sided weakness 09/01/2020   History of Lyme disease 09/01/2020   Abdominal pain, chronic, epigastric 08/18/2020   Unintentional weight loss 08/18/2020   Chronic constipation 08/18/2020   History of pancreatitis 08/18/2020   Family history of colon cancer 08/18/2020   Sinusitis 06/07/2020   Pancreatitis 05/21/2020   Cervical radiculopathy 05/14/2020   Rash and other nonspecific skin eruption 01/09/2020   Essential hypertension 01/03/2020   Vitamin D deficiency 11/26/2019   Hyperglycemia 11/26/2019   Tobacco use 10/11/2019   Hyperlipemia    Fatty liver 05/06/2019   S/P lumbar fusion 11/15/2017   Labral tear of shoulder,  degenerative, left 02/21/2017   Elevated LFTs 01/09/2017   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

## 2021-04-29 ENCOUNTER — Ambulatory Visit: Payer: 59 | Admitting: Occupational Therapy

## 2021-05-03 ENCOUNTER — Other Ambulatory Visit: Payer: Self-pay

## 2021-05-03 ENCOUNTER — Ambulatory Visit: Payer: 59 | Attending: Internal Medicine | Admitting: Occupational Therapy

## 2021-05-03 ENCOUNTER — Encounter: Payer: Self-pay | Admitting: Occupational Therapy

## 2021-05-03 DIAGNOSIS — R278 Other lack of coordination: Secondary | ICD-10-CM

## 2021-05-03 DIAGNOSIS — M25641 Stiffness of right hand, not elsewhere classified: Secondary | ICD-10-CM | POA: Diagnosis present

## 2021-05-03 DIAGNOSIS — M25642 Stiffness of left hand, not elsewhere classified: Secondary | ICD-10-CM | POA: Diagnosis present

## 2021-05-03 DIAGNOSIS — M6281 Muscle weakness (generalized): Secondary | ICD-10-CM

## 2021-05-03 DIAGNOSIS — M79642 Pain in left hand: Secondary | ICD-10-CM

## 2021-05-03 DIAGNOSIS — M79641 Pain in right hand: Secondary | ICD-10-CM | POA: Diagnosis present

## 2021-05-03 NOTE — Therapy (Signed)
[facility] [address] [place], [place], [address] Phone: [phone]   Fax:  [phone]  Occupational Therapy Treatment  Patient Details  Name: [name] MRN: [id number] Date of Birth: 05-Feb-1971 Referring Provider (OT): Dr. Sheliah Hatch   Encounter Date: 05/03/2021   OT End of Session - 05/03/21 1237     Visit Number 8    Number of Visits 20    Date for OT Re-Evaluation 06/12/21    Authorization Type [employer]; covered 100%    OT Start Time 1233    OT Stop Time 1315    OT Time Calculation (min) 42 min    Activity Tolerance Patient limited by pain    Behavior During Therapy [facility] for tasks assessed/performed             Past Medical History:  Diagnosis Date   Angio-edema    Anxiety    Disseminated Lyme disease    Dystonia    Foot drop, right 2000   Headache    Hypertension 2019   Lyme disease    Neck pain    Rocky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    Urticaria     Past Surgical History:  Procedure Laterality Date   ADENOIDECTOMY     ANTERIOR CERVICAL DECOMPRESSION/DISCECTOMY FUSION 4 LEVELS N/A 01/20/2021   Procedure: ANTERIOR CERVICAL DECOMPRESSION/DISCECTOMY FUSION 3  LEVELS C5-T1;  Surgeon: Venita Lick, MD;  Location[facility];  Service: Orthopedics;  Laterality: N/A;   BACK SURGERY     SACROILIAC JOINT FUSION Right 11/15/2017   Procedure: SACROILIAC JOINT FUSION;  Surgeon: Venita Lick, MD;  Location: [facility] OR;  Service: Orthopedics;  Laterality: Right;  120 mins   SACROILIAC JOINT FUSION Left 07/25/2018   Procedure: SACROILIAC JOINT FUSION;  Surgeon: Venita Lick, MD;  Location: [facility];  Service: Orthopedics;  Laterality: Left;  SACROILIAC JOINT FUSION   TONSILLECTOMY     50 years old    There were no vitals filed for this visit.   Subjective Assessment - 05/03/21 1234     Subjective  Pt reports that the R hand is getting worse again.    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 Yes    Pain Score --   4/10 in R, 5/10 in L   Pain Location Hand    Pain Orientation Right;Left    Pain Descriptors / Indicators Aching    Pain Type Chronic pain    Pain Onset More than a month ago    Pain Frequency Constant    Aggravating Factors  use    Pain Relieving Factors injections, braces                Ultrasound x 5 min Lt volar forearm (20% pulsed, 3 Mhz, 0.8 wts/cm2), followed by 5 min in Lt palm same parameters = 10 min total with no adverse reactions  NMES x 10 min LUE for wrist/finger extension and thumb abduction 50pps, 250 pulse width, 10sec cycle, intensity=25 with no adverse reactions.   Gentle PROM to R thumb in ext, palmar/radial abduction and to L thumb in palmar and radial abduction and ext as well as wrist ext as able.  Discussed possible TENS unit for L hand pain with/without NMES.  Pt reports that he already has TENS unit at home for neck.  Recommended trying for L hand and discussed possible electrode placement.  Requested pt bring in unit to further explore.          OT Short Term Goals - 04/19/21 1417       OT SHORT TERM GOAL #1   Title Independent with daytime and pm bilateral splints to improve positioning, function, and pain    Time 4    Period Weeks    Status Achieved      OT SHORT TERM GOAL #2   Title Independent with initial HEP    Time 4    Period Weeks    Status Achieved      OT SHORT TERM GOAL #3   Title Pt to verbalize understanding with A/E recommendations and task modifications to increase independence with ADLS (including cutting food, writing, dressing/buttons & belt, bathing, etc)    Time 4    Period Weeks    Status On-going               OT Long Term Goals - 03/31/21 1454       OT LONG TERM  GOAL #1   Title Pt independent with updated HEP    Time 8    Period Weeks    Status New      OT LONG TERM GOAL #2   Title Pt to improve BUE hand function as evidenced by increasing Box & Blocks score by 6    Baseline Rt = 17, Lt = 16    Time 8    Period Weeks    Status New      OT LONG TERM GOAL #3   Title Pt overall mod assist or less for BADLS using A/E prn    Time 8    Period Weeks    Status New      OT LONG TERM GOAL #4   Title Pt to report pain 5/10 or under bilateral thumbs    Baseline 8/10    Time 8    Period Weeks    Status New      OT LONG TERM GOAL #5   Title Pt to demo 75% composite extension Lt hand in prep for functional tasks/grasping    Time 8    Period Weeks    Status New                   Plan - 05/03/21 1237     Clinical Impression Statement Pt reports using his hand a lot yesterday to put up Christmas tree and L hand with incr spasms today.  Discussed possible TENS use for L hand as pt has unit at home for neck.    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 ADL;Coordination;GMC;Endurance;UE functional use;Sensation;Body mechanics;Flexibility;IADL;Pain;FMC;Dexterity;Strength;Edema;Mobility;ROM    Rehab Potential Fair   time since onset, severity of pain   Clinical Decision Making Several treatment options, min-mod task modification necessary    Comorbidities Affecting Occupational Performance: Presence of comorbidities impacting occupational performance    Comorbidities impacting occupational performance description: Lymes disease    Modification or Assistance to Complete Evaluation  Min-Moderate modification of tasks or assist with  assess necessary to complete eval  OT Frequency 2x / week    OT Duration --   10 weeks (may only need 8 weeks)   OT Treatment/Interventions Self-care/ADL training;Cryotherapy;Therapeutic exercise;DME and/or AE instruction;Functional Mobility Training;Cognitive remediation/compensation;Ultrasound;Manual Therapy;Splinting;Passive range of motion;Therapeutic activities;Patient/family education;Coping strategies training    Plan check if pt tried TENS for L hand/adjust settings if pt brings in; continue [place] for Lt palm and estim for wrist and finger ext, continue A/E exploration prn, functional use of Rt hand    Consulted and Agree with Plan of Care Patient             Patient will benefit from skilled therapeutic intervention in order to improve the following deficits and impairments:   Body Structure / Function / Physical Skills: ADL, Coordination, GMC, Endurance, UE functional use, Sensation, Body mechanics, Flexibility, IADL, Pain, FMC, Dexterity, Strength, Edema, Mobility, ROM       Visit Diagnosis: Pain in right hand  Pain in left hand  Stiffness of left hand, not elsewhere classified  Stiffness of right hand, not elsewhere classified  Muscle weakness (generalized)  Other lack of coordination    Problem List Patient Active Problem List   Diagnosis Date Noted   Pain of right thumb 04/12/2021   Other spondylosis with myelopathy, cervical region 02/02/2021   Low back pain 01/19/2021   Swelling of both hands 12/22/2020   Neck pain on left side 09/30/2020   Gait abnormality 09/30/2020   Left-sided weakness 09/01/2020   History of Lyme disease 09/01/2020   Abdominal pain, chronic, epigastric 08/18/2020   Unintentional weight loss 08/18/2020   Chronic constipation 08/18/2020   History of pancreatitis 08/18/2020   Family history of colon cancer 08/18/2020   Sinusitis 06/07/2020   Pancreatitis 05/21/2020   Cervical radiculopathy 05/14/2020   Rash and other nonspecific  skin eruption 01/09/2020   Essential hypertension 01/03/2020   Vitamin D deficiency 11/26/2019   Hyperglycemia 11/26/2019   Tobacco use 10/11/2019   Hyperlipemia    Fatty liver 05/06/2019   S/P lumbar fusion 11/15/2017   Labral tear of shoulder, degenerative, left 02/21/2017   Elevated LFTs 01/09/2017   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 [place], [place]  [address] [phone] phone [phone] 05/03/21 1:50 PM

## 2021-05-05 ENCOUNTER — Other Ambulatory Visit: Payer: Self-pay

## 2021-05-05 ENCOUNTER — Ambulatory Visit: Payer: 59 | Admitting: Occupational Therapy

## 2021-05-05 ENCOUNTER — Telehealth: Payer: Self-pay | Admitting: *Deleted

## 2021-05-05 DIAGNOSIS — M79641 Pain in right hand: Secondary | ICD-10-CM | POA: Diagnosis not present

## 2021-05-05 DIAGNOSIS — M25641 Stiffness of right hand, not elsewhere classified: Secondary | ICD-10-CM

## 2021-05-05 DIAGNOSIS — M25642 Stiffness of left hand, not elsewhere classified: Secondary | ICD-10-CM

## 2021-05-05 DIAGNOSIS — M79642 Pain in left hand: Secondary | ICD-10-CM

## 2021-05-05 NOTE — Therapy (Signed)
[facility] [address] [place], [place], [address] Phone: [phone]   Fax:  [phone]  Occupational Therapy Treatment  Patient Details  Name: [name] MRN: [id number] Date of Birth: 1970/06/26 Referring Provider (OT): Dr. Sheliah Hatch   Encounter Date: 05/05/2021   OT End of Session - 05/05/21 1419     Visit Number 9    Number of Visits 20    Date for OT Re-Evaluation 06/12/21    Authorization Type [employer]; covered 100%    OT Start Time 1230    OT Stop Time 1315    OT Time Calculation (min) 45 min    Activity Tolerance Patient limited by pain    Behavior During Therapy [facility] for tasks assessed/performed             Past Medical History:  Diagnosis Date   Angio-edema    Anxiety    Disseminated Lyme disease    Dystonia    Foot drop, right 2000   Headache    Hypertension 2019   Lyme disease    Neck pain    Rocky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    Urticaria     Past Surgical History:  Procedure Laterality Date   ADENOIDECTOMY     ANTERIOR CERVICAL DECOMPRESSION/DISCECTOMY FUSION 4 LEVELS N/A 01/20/2021   Procedure: ANTERIOR CERVICAL DECOMPRESSION/DISCECTOMY FUSION 3  LEVELS C5-T1;  Surgeon: Venita Lick, MD;  Location: [facility] OR;  Service: Orthopedics;  Laterality: N/A;   BACK SURGERY     SACROILIAC JOINT FUSION Right 11/15/2017   Procedure: SACROILIAC JOINT FUSION;  Surgeon: Venita Lick, MD;  [facility];  Service: Orthopedics;  Laterality: Right;  120 mins   SACROILIAC JOINT FUSION Left 07/25/2018   Procedure: SACROILIAC JOINT FUSION;  Surgeon: Venita Lick, MD;  Location: [facility] OR;  Service: Orthopedics;  Laterality: Left;  SACROILIAC JOINT FUSION   TONSILLECTOMY     50 years old    There were no vitals filed for this visit.   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 Yes    Pain Score --   3-4/10 Rt thumb, 6/10 Lt thumb   Pain Location --   BILATERAL THUMBS   Pain Orientation Right;Left    Pain Descriptors / Indicators Throbbing;Sharp   Lt thumb   Pain Type Chronic pain    Pain Onset More than a month ago    Pain Frequency Constant    Aggravating Factors  use    Pain Relieving Factors injections, braces                          OT Treatments/Exercises (OP) - 05/05/21 0001       ADLs   ADL Comments Pt asked to monitor hands after fluidotherapy for up to an hour to make sure heat did not affect Lyme's (systemic response) but instructed it may help manage pain and will try it today.  Discussed benefit of NMES/TENS combo unit for Lt hand as TENS would help w/ pain, however NMES would stimulate muscle and get hand/thumb opened up more. Pt shown  options online. Pt forgot TENS unit today - will bring next session      Functional Reaching Activities   Low Level Reaching RUE to grasp/release cup for increased palmer abduction Rt hand (while pt receiving estim to Lt hand/forearm)      Modalities   Modalities Fluidotherapy;Geologist, engineering Location dorsal forearm Lt    Electrical Stimulation Action full composite extension, 50 pps, 250 pw, 10 sec on/off cycle x 10 min.    Electrical Stimulation Parameters see above    Electrical Stimulation Goals Pain;Neuromuscular facilitation   ROM     RUE Fluidotherapy   Number Minutes Fluidotherapy 10 Minutes    RUE Fluidotherapy Location Hand;Wrist    Comments at beginning of session to decrease pain      LUE Fluidotherapy   Number Minutes Fluidotherapy 10 Minutes    LUE Fluidotherapy Location Hand;Wrist    Comments simultaneously w/ RUE                     OT Education - 05/05/21 1407     Education Details TENS/NMES Combo unit    Person(s) Educated Patient    Methods Explanation;Handout    Comprehension Verbalized understanding              OT Short Term Goals - 04/19/21 1417       OT SHORT TERM GOAL #1   Title Independent with daytime and pm bilateral splints to improve positioning, function, and pain    Time 4    Period Weeks    Status Achieved      OT SHORT TERM GOAL #2   Title Independent with initial HEP    Time 4    Period Weeks    Status Achieved      OT SHORT TERM GOAL #3   Title Pt to verbalize understanding with A/E recommendations and task modifications to increase independence with ADLS (including cutting food, writing, dressing/buttons & belt, bathing, etc)    Time 4    Period Weeks    Status On-going               OT Long Term Goals - 03/31/21 1454       OT LONG TERM GOAL #1   Title Pt independent with updated HEP    Time 8    Period Weeks    Status New      OT LONG TERM GOAL #2   Title Pt to improve BUE hand function as evidenced by increasing Box & Blocks score by 6    Baseline Rt = 17, Lt = 16    Time 8    Period Weeks    Status New      OT LONG TERM GOAL #3   Title Pt overall mod assist or less for BADLS using A/E prn    Time 8    Period Weeks    Status New      OT LONG TERM GOAL #4   Title Pt to report pain 5/10 or under bilateral thumbs    Baseline 8/10    Time 8    Period Weeks    Status New      OT LONG TERM GOAL #5   Title Pt to demo 75% composite extension Lt hand in prep for functional tasks/grasping    Time 8    Period Weeks    Status New  Plan - 05/05/21 1420     Clinical Impression Statement Pain overall better Rt hand/thumb today but continues to be worse Lt thumb/hand.    OT Occupational Profile and History Detailed Assessment- Review of Records and additional review of physical, cognitive, psychosocial  history related to current functional performance    Occupational performance deficits (Please refer to evaluation for details): ADL's;IADL's;Leisure;Social Participation;Work    Games developer / Function / Physical Skills ADL;Coordination;GMC;Endurance;UE functional use;Sensation;Body mechanics;Flexibility;IADL;Pain;FMC;Dexterity;Strength;Edema;Mobility;ROM    Rehab Potential Fair   time since onset, severity of pain   Clinical Decision Making Several treatment options, min-mod task modification necessary    Comorbidities Affecting Occupational Performance: Presence of comorbidities impacting occupational performance    Comorbidities impacting occupational performance description: Lymes disease    Modification or Assistance to Complete Evaluation  Min-Moderate modification of tasks or assist with assess necessary to complete eval    OT Frequency 2x / week    OT Duration --   10 weeks (may only need 8 weeks)   OT Treatment/Interventions Self-care/ADL training;Cryotherapy;Therapeutic exercise;DME and/or AE instruction;Functional Mobility Training;Cognitive remediation/compensation;Ultrasound;Manual Therapy;Splinting;Passive range of motion;Therapeutic activities;Patient/family education;Coping strategies training    Plan bring TENS unit in for Lt hand set up; continue [place] for Lt palm and estim for wrist and finger ext, continue A/E exploration prn, functional use of Rt hand    Consulted and Agree with Plan of Care Patient             Patient will benefit from skilled therapeutic intervention in order to improve the following deficits and impairments:   Body Structure / Function / Physical Skills: ADL, Coordination, GMC, Endurance, UE functional use, Sensation, Body mechanics, Flexibility, IADL, Pain, FMC, Dexterity, Strength, Edema, Mobility, ROM       Visit Diagnosis: Pain in right hand  Pain in left hand  Stiffness of left hand, not elsewhere classified  Stiffness of right hand, not  elsewhere classified    Problem List Patient Active Problem List   Diagnosis Date Noted   Pain of right thumb 04/12/2021   Other spondylosis with myelopathy, cervical region 02/02/2021   Low back pain 01/19/2021   Swelling of both hands 12/22/2020   Neck pain on left side 09/30/2020   Gait abnormality 09/30/2020   Left-sided weakness 09/01/2020   History of Lyme disease 09/01/2020   Abdominal pain, chronic, epigastric 08/18/2020   Unintentional weight loss 08/18/2020   Chronic constipation 08/18/2020   History of pancreatitis 08/18/2020   Family history of colon cancer 08/18/2020   Sinusitis 06/07/2020   Pancreatitis 05/21/2020   Cervical radiculopathy 05/14/2020   Rash and other nonspecific skin eruption 01/09/2020   Essential hypertension 01/03/2020   Vitamin D deficiency 11/26/2019   Hyperglycemia 11/26/2019   Tobacco use 10/11/2019   Hyperlipemia    Fatty liver 05/06/2019   S/P lumbar fusion 11/15/2017   Labral tear of shoulder, degenerative, left 02/21/2017   Elevated LFTs 01/09/2017   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 [place], [place], [address] Phone: [phone]   Fax:  [phone]  Name: [name] MRN: [id number] Date of Birth: Dec 03, 1970

## 2021-05-05 NOTE — Telephone Encounter (Signed)
-----   Message from Lavonda Jumbo, DO sent at 05/05/2021 10:42 AM EST ----- Can we please call this patient to be seen in [facility] clinic tomorrow? Sorry for the tardiness. Thank you for your help.

## 2021-05-05 NOTE — Telephone Encounter (Signed)
Pt scheduled. Please call him back with appt time. [name] Bruna Potter, CMA

## 2021-05-06 NOTE — Telephone Encounter (Signed)
*  Late documentation* Call made 12/7 @ 7:30 PM. Patient did not answer. LVM inviting patient to [facility] clinic.   Ravyn Nikkel Autry-Lott, DO 05/06/2021, 8:15 AM PGY-3, [facility]

## 2021-05-07 ENCOUNTER — Encounter: Payer: Self-pay | Admitting: Family Medicine

## 2021-05-07 ENCOUNTER — Ambulatory Visit (INDEPENDENT_AMBULATORY_CARE_PROVIDER_SITE_OTHER): Payer: 59 | Admitting: Family Medicine

## 2021-05-07 ENCOUNTER — Other Ambulatory Visit: Payer: Self-pay | Admitting: Allergy & Immunology

## 2021-05-07 ENCOUNTER — Other Ambulatory Visit: Payer: Self-pay

## 2021-05-07 VITALS — BP 128/82 | HR 80 | Ht 72.0 in | Wt 243.0 lb

## 2021-05-07 DIAGNOSIS — M79642 Pain in left hand: Secondary | ICD-10-CM

## 2021-05-07 DIAGNOSIS — Z8619 Personal history of other infectious and parasitic diseases: Secondary | ICD-10-CM

## 2021-05-07 DIAGNOSIS — R4589 Other symptoms and signs involving emotional state: Secondary | ICD-10-CM | POA: Diagnosis not present

## 2021-05-07 DIAGNOSIS — M79641 Pain in right hand: Secondary | ICD-10-CM | POA: Diagnosis not present

## 2021-05-07 MED ORDER — GABAPENTIN 300 MG PO CAPS: 600.0000 mg | ORAL_CAPSULE | Freq: Two times a day (BID) | ORAL | 11 refills | Status: DC

## 2021-05-07 NOTE — Patient Instructions (Addendum)
Thank you for coming in today and talking with [place]. We discussed increasing your gabapentin for nerve pain to 600 mg twice daily. We also discussed how this affected your mood and understandably so. Our plan is to seek help with [email or web address] and follow up in 2 weeks to assess for improvement. Please feel free to reach out if needed prior to the next appointment time.  I would encourage you to keep all follow up appointments with your specialist.   Dr. Salvadore Dom

## 2021-05-07 NOTE — Progress Notes (Signed)
SUBJECTIVE:   CHIEF COMPLAINT / HPI:   Mr. [name] is a 50 yo M who presents to [facility] clinic. Below were the topics discussed.   Bilateral hand pain and weakness, Hx of Lyme disease Initially began in 2018 and worsening until now.  He has had multiple surgeries on his neck and SI joint spanning from 2019-2022.  Most recently anterior cervical fusion of C4-T2 in which she noticed worsening function in left hand.  This has become very distressing to him as he once worked with his hands.  He also wants to be physically well for his family.  He is also prior [employer] working 50 hours a week and now he is not able to work at all.  He is planning to receive disability.  He has been able to survive off of the money saved prior to present illness.  He has recently seen the orthopedic doctor and rheumatologist for hand pain and weakness.  There has been discussion of injections or surgery in the near future if desired.  He goes to hand occupational therapy 2 times a week which seems to be helping some.  He is taking ibuprofen, Tylenol, gabapentin 300 mg twice daily, and antihistamine for pain.  Antihistamine seems to be the most helpful at this time.  The pain does not allow him to sleep adequately.  He does have a follow-up scheduled with his pain doctor Dr. Ethelene Hal but was recently taken off of opioids and has no desire to restart them at this time.  His reasoning for not wanting to restart them is because he wants to know where the pain is coming from and states that he was unable to feel anything with taking opioids.  Depressed mood No prior history. Worsening since 2018 when he became ill with Lyme disease.  He is most worried about progression of declining function.  He is also very worried about uncertainty and having to go into the ICU again. States his condition makes him very sad, not angry. In 2014 he states that his sister also died from Lyme's disease.  He also does not go around his parents.  Mother  specifically seems to get upset when she sees the declining function of his hands because this is a similar symptom of his sister's illness.  He denies active and passive SI.  Protective factors are wife, children and granddaughter, church, talking to others.  He is open to therapy at this time.  PERTINENT  PMH / PSH: HTN, pancreatitis  OBJECTIVE:   BP 128/82   Pulse 80   Ht 6' (1.829 m)   Wt 243 lb (110.2 kg)   SpO2 96%   BMI 32.96 kg/m   [employer] Visit from 05/07/2021 in [facility]  PHQ-9 Total Score 20      GAD 7 : Generalized Anxiety Score 05/08/2021  Nervous, Anxious, on Edge 3  Control/stop worrying 2  Worry too much - different things 3  Trouble relaxing 3  Restless 2  Easily annoyed or irritable 1  Afraid - awful might happen 3  Total GAD 7 Score 17     General: Appears well but tired, no acute distress. Age appropriate. Cardiac: RRR, normal heart sounds, no murmurs Respiratory: CTAB, normal effort Abdomen: soft, nontender, nondistended Extremities: Bilateral wrist braces worn by patient.  Decreased ROM in both hands. Left markedly worse than the right. Left is exquisitely tender to palpation. Left hand in closed fist position, right hand with increased  resting tone.  Skin: Warm and dry, no rashes noted Neuro: alert and oriented.  Psych: flat affect, depressed mood. Tearful when discussing medical condition declining  ASSESSMENT/PLAN:   Pain in both hands, History of Lyme disease, s/p fusion of C4-T2 Chronic for right hand since diagnosis of Lyme disease in 2018.  Function of left hand as well as pain increase since cervical fusion in August 2022.  Likely neuropathic pain.  Pain is affecting sleep which is most distressing to the patient.  Taking over-the-counter analgesics as well as gabapentin 300 mg twice daily.  Does not desire opioid use; has history, tapered off in the past and pain doctor appointment soon.  We will plan to  increase gabapentin to help with ongoing nerve pain.  Can continue over-the-counter medications as needed for pain relief.  Encouraged continued follow-up with hand occupational therapy as well as orthopedic doctor, rheumatology Dr., Pain doctor.  Follow-up in 2 weeks to assess for improvement.  Discussed with Dr. Deirdre Priest to consider work-up for other causes of hand pain outside of history of Lyme disease such as other autoimmune etiologies.  Consider neurology follow-up if patient is not already connected. - gabapentin (NEURONTIN) 300 MG capsule; Take 2 capsules (600 mg total) by mouth 2 (two) times daily.  Dispense: 90 capsule; Refill: 11  Depressed mood No prior history.  Increasing since 2018 according to patient.  Denies active or passive SI.  Protective factors are family and church community.  Discussed extensively starting therapy.  Also discussed possibility of improving pain which could improve sleep which could ultimately improve mood.  Resource of [email or web address] given, website explained.  Plan to follow-up in 2 weeks and assess initiation of therapy and reassess mood.    Lavonda Jumbo, DO [facility]

## 2021-05-08 DIAGNOSIS — M79642 Pain in left hand: Secondary | ICD-10-CM | POA: Insufficient documentation

## 2021-05-08 DIAGNOSIS — R4589 Other symptoms and signs involving emotional state: Secondary | ICD-10-CM | POA: Insufficient documentation

## 2021-05-08 DIAGNOSIS — M79641 Pain in right hand: Secondary | ICD-10-CM | POA: Insufficient documentation

## 2021-05-09 ENCOUNTER — Other Ambulatory Visit: Payer: Self-pay | Admitting: Cardiovascular Disease

## 2021-05-09 DIAGNOSIS — I10 Essential (primary) hypertension: Secondary | ICD-10-CM

## 2021-05-10 ENCOUNTER — Encounter: Payer: Self-pay | Admitting: Family Medicine

## 2021-05-10 ENCOUNTER — Encounter: Payer: Self-pay | Admitting: Occupational Therapy

## 2021-05-10 ENCOUNTER — Ambulatory Visit: Payer: 59 | Admitting: Occupational Therapy

## 2021-05-10 ENCOUNTER — Other Ambulatory Visit: Payer: Self-pay

## 2021-05-10 DIAGNOSIS — M25642 Stiffness of left hand, not elsewhere classified: Secondary | ICD-10-CM

## 2021-05-10 DIAGNOSIS — M79641 Pain in right hand: Secondary | ICD-10-CM

## 2021-05-10 DIAGNOSIS — M6281 Muscle weakness (generalized): Secondary | ICD-10-CM

## 2021-05-10 DIAGNOSIS — M79642 Pain in left hand: Secondary | ICD-10-CM

## 2021-05-10 DIAGNOSIS — M25641 Stiffness of right hand, not elsewhere classified: Secondary | ICD-10-CM

## 2021-05-10 DIAGNOSIS — R278 Other lack of coordination: Secondary | ICD-10-CM

## 2021-05-10 NOTE — Therapy (Addendum)
[facility] [address] [place], [place], [address] Phone: [phone]   Fax:  [phone]  Occupational Therapy Treatment  Patient Details  Name: [name] MRN: [id number] Date of Birth: [birth year] Referring Provider (OT): Dr. Sheliah Hatch   Encounter Date: 05/10/2021   OT End of Session - 05/10/21 1421     Visit Number 10    Number of Visits 20    Date for OT Re-Evaluation 06/12/21    Authorization Type [employer]; covered 100%    OT Start Time 1318    OT Stop Time 1410    OT Time Calculation (min) 52 min    Activity Tolerance Patient limited by pain    Behavior During Therapy [facility] for tasks assessed/performed             Past Medical History:  Diagnosis Date   Angio-edema    Anxiety    Disseminated Lyme disease    Dystonia    Foot drop, right 2000   Headache    Hypertension 2019   Lyme disease    Neck pain    Rocky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    Urticaria     Past Surgical History:  Procedure Laterality Date   ADENOIDECTOMY     ANTERIOR CERVICAL DECOMPRESSION/DISCECTOMY FUSION 4 LEVELS N/A 01/20/2021   Procedure: ANTERIOR CERVICAL DECOMPRESSION/DISCECTOMY FUSION 3  LEVELS C5-T1;  Surgeon: Venita Lick, MD;  Location: [facility] OR;  Service: Orthopedics;  Laterality: N/A;   BACK SURGERY     SACROILIAC JOINT FUSION Right 11/15/2017   Procedure: SACROILIAC JOINT FUSION;  Surgeon: Venita Lick, MD;  Location: [facility] OR;  Service: Orthopedics;  Laterality: Right;  120 mins   SACROILIAC JOINT FUSION Left 07/25/2018   Procedure: SACROILIAC JOINT FUSION;  Surgeon: Venita Lick, MD;  Location: [facility] OR;  Service: Orthopedics;  Laterality: Left;  SACROILIAC JOINT FUSION   TONSILLECTOMY     50 years old    There were no vitals filed for this visit.   Subjective Assessment - 05/10/21 1325     Subjective  I forgot to bring the TENS unit, but pt reports using 2x but unsure if it helped.  Pt reports  that hands "are a lot better than they were when I started"    Pertinent History OA Bilateral hands and cervical spondylosis w/ myelopathy (due to disseminated Lyme's dz) w/ ACDF 01/20/21 c5-t1, SI joint fusions Rt 2019, Lt 2020    Patient Stated Goals to use my thumbs and hands better with less pain    Currently in Pain? Yes    Pain Score 3     Pain Location Wrist    Pain Orientation Right;Left    Pain Descriptors / Indicators Pressure    Pain Type Chronic pain    Pain Onset More than a month ago    Pain Frequency Constant    Aggravating Factors  use    Pain Relieving Factors medications               Ultrasound x 7 min Lt volar forearm (50% pulsed, 3 Mhz, 0.8 wts/cm2), followed by 5 min in Lt palm same parameters = 12 min total with no adverse reactions  NMES x 10 min LUE for wrist/finger extension and thumb abduction 50pps, 250 pulse width, 10sec cycle, intensity=25 with no adverse reactions.   Gentle PROM to L thumb in palmar and radial abduction and ext as well as wrist ext as able.  Recommended using TENS unit  2x/day regularly for L hand pain and reviewed electrode placement.  Requested pt bring in unit to further explore as pt forgot today.    Pt reports that button hook comes in this week (he ordered) and that his wife is exploring NMES home unit  Wt. Bearing through L hand on table for thumb/wrist ext stretch.  Grasp/release cylinder object with L hand with splint with facilitation for thumb ext for release.            OT Short Term Goals - 04/19/21 1417       OT SHORT TERM GOAL #1   Title Independent with daytime and pm bilateral splints to improve positioning, function, and pain    Time 4    Period Weeks    Status Achieved      OT SHORT TERM GOAL #2   Title Independent with initial HEP    Time 4    Period Weeks    Status Achieved      OT SHORT TERM GOAL #3   Title Pt to verbalize understanding with A/E recommendations and task modifications to  increase independence with ADLS (including cutting food, writing, dressing/buttons & belt, bathing, etc)    Time 4    Period Weeks    Status On-going               OT Long Term Goals - 03/31/21 1454       OT LONG TERM GOAL #1   Title Pt independent with updated HEP    Time 8    Period Weeks    Status New      OT LONG TERM GOAL #2   Title Pt to improve BUE hand function as evidenced by increasing Box & Blocks score by 6    Baseline Rt = 17, Lt = 16    Time 8    Period Weeks    Status New      OT LONG TERM GOAL #3   Title Pt overall mod assist or less for BADLS using A/E prn    Time 8    Period Weeks    Status New      OT LONG TERM GOAL #4   Title Pt to report pain 5/10 or under bilateral thumbs    Baseline 8/10    Time 8    Period Weeks    Status New      OT LONG TERM GOAL #5   Title Pt to demo 75% composite extension Lt hand in prep for functional tasks/grasping    Time 8    Period Weeks    Status New                   Plan - 05/10/21 1422     Clinical Impression Statement Pt reports overall decr pain and not as sharp today (neurontin was increased).   Pt reports improvement overall in bilateral hands but that he is still having trouble with L thumb adducting.    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 ADL;Coordination;GMC;Endurance;UE functional use;Sensation;Body mechanics;Flexibility;IADL;Pain;FMC;Dexterity;Strength;Edema;Mobility;ROM    Rehab Potential Fair   time since onset, severity of pain   Clinical Decision Making Several treatment options, min-mod task modification necessary    Comorbidities Affecting Occupational Performance:  Presence of comorbidities impacting occupational performance    Comorbidities impacting occupational performance description: Lymes disease    Modification or Assistance to Complete Evaluation  Min-Moderate modification of tasks or assist with assess necessary to complete eval    OT Frequency 2x / week    OT Duration --   10 weeks (may only need 8 weeks)   OT Treatment/Interventions Self-care/ADL training;Cryotherapy;Therapeutic exercise;DME and/or AE instruction;Functional Mobility Training;Cognitive remediation/compensation;Ultrasound;Manual Therapy;Splinting;Passive range of motion;Therapeutic activities;Patient/family education;Coping strategies training    Plan bring TENS unit in for Lt hand set up; continue [place] for Lt palm and estim for wrist and finger ext, continue A/E exploration prn, functional use of hands    Consulted and Agree with Plan of Care Patient             Patient will benefit from skilled therapeutic intervention in order to improve the following deficits and impairments:   Body Structure / Function / Physical Skills: ADL, Coordination, GMC, Endurance, UE functional use, Sensation, Body mechanics, Flexibility, IADL, Pain, FMC, Dexterity, Strength, Edema, Mobility, ROM       Visit Diagnosis: Pain in right hand  Pain in left hand  Stiffness of left hand, not elsewhere classified  Stiffness of right hand, not elsewhere classified  Muscle weakness (generalized)  Other lack of coordination    Problem List Patient Active Problem List   Diagnosis Date Noted   Depressed mood 05/08/2021   Pain in both hands 05/08/2021   Pain of right thumb 04/12/2021   Other spondylosis with myelopathy, cervical region 02/02/2021   Low back pain 01/19/2021   Swelling of both hands 12/22/2020   Neck pain on left side 09/30/2020   Gait abnormality 09/30/2020   Left-sided weakness 09/01/2020   History of Lyme disease 09/01/2020   Abdominal pain, chronic, epigastric 08/18/2020    Unintentional weight loss 08/18/2020   Chronic constipation 08/18/2020   History of pancreatitis 08/18/2020   Family history of colon cancer 08/18/2020   Sinusitis 06/07/2020   Pancreatitis 05/21/2020   Cervical radiculopathy 05/14/2020   Rash and other nonspecific skin eruption 01/09/2020   Essential hypertension 01/03/2020   Vitamin D deficiency 11/26/2019   Hyperglycemia 11/26/2019   Tobacco use 10/11/2019   Hyperlipemia    Fatty liver 05/06/2019   S/P lumbar fusion 11/15/2017   Labral tear of shoulder, degenerative, left 02/21/2017   Elevated LFTs 01/09/2017   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 [place], [place], [address] Phone: [phone]   Fax:  [phone]  Name: [name] MRN: [id number] Date of Birth: 1971-03-27   Willa Frater, OTR/L [facility] [address] [place], [place]  [address] [phone] phone [phone] 05/10/21 2:29 PM

## 2021-05-11 MED ORDER — FEXOFENADINE HCL 180 MG PO TABS: 180.0000 mg | ORAL_TABLET | Freq: Every day | ORAL | 0 refills | Status: DC

## 2021-05-11 MED ORDER — FAMOTIDINE 20 MG PO TABS: 20.0000 mg | ORAL_TABLET | ORAL | 0 refills | Status: DC | PRN

## 2021-05-13 ENCOUNTER — Ambulatory Visit (INDEPENDENT_AMBULATORY_CARE_PROVIDER_SITE_OTHER): Payer: 59 | Admitting: Family Medicine

## 2021-05-13 ENCOUNTER — Encounter: Payer: Self-pay | Admitting: Occupational Therapy

## 2021-05-13 ENCOUNTER — Other Ambulatory Visit: Payer: Self-pay

## 2021-05-13 ENCOUNTER — Ambulatory Visit: Payer: 59 | Admitting: Occupational Therapy

## 2021-05-13 VITALS — BP 123/93 | HR 89 | Ht 72.0 in | Wt 239.0 lb

## 2021-05-13 DIAGNOSIS — M6281 Muscle weakness (generalized): Secondary | ICD-10-CM

## 2021-05-13 DIAGNOSIS — M79641 Pain in right hand: Secondary | ICD-10-CM | POA: Diagnosis not present

## 2021-05-13 DIAGNOSIS — M79642 Pain in left hand: Secondary | ICD-10-CM

## 2021-05-13 DIAGNOSIS — R1012 Left upper quadrant pain: Secondary | ICD-10-CM | POA: Diagnosis not present

## 2021-05-13 DIAGNOSIS — R109 Unspecified abdominal pain: Secondary | ICD-10-CM | POA: Diagnosis not present

## 2021-05-13 DIAGNOSIS — M25642 Stiffness of left hand, not elsewhere classified: Secondary | ICD-10-CM

## 2021-05-13 DIAGNOSIS — R278 Other lack of coordination: Secondary | ICD-10-CM

## 2021-05-13 DIAGNOSIS — M25641 Stiffness of right hand, not elsewhere classified: Secondary | ICD-10-CM

## 2021-05-13 MED ORDER — KETOROLAC TROMETHAMINE 30 MG/ML IJ SOLN
30.0000 mg | Freq: Once | INTRAMUSCULAR | Status: AC
  Administered 2021-05-13: 30 mg via INTRAMUSCULAR

## 2021-05-13 MED ORDER — KETOROLAC TROMETHAMINE 30 MG/ML IJ SOLN: 30.0000 mg | Freq: Once | INTRAMUSCULAR | 0 refills | Status: DC

## 2021-05-13 NOTE — Patient Instructions (Signed)
You were given a medication that will interact with ibuprofen/aleve/advil if taken today. Avoid these over the counter medications until tomorrow.    I will send you a MyChart message about your lab work. I will call you regarding anything that is urgent.    If you would like to space out your Gabapentin throughout the day, that is okay. Try to keep at least 6 hours between taking the tablets.   Take Care,   Dr. Rachael Darby

## 2021-05-13 NOTE — Progress Notes (Signed)
° °  SUBJECTIVE:   CHIEF COMPLAINT / HPI:   Chief Complaint  Patient presents with   Back Pain    Rib area     [name] is a 50 y.o. male here for left rib pain. Reports left lower ribcage pain for the past 4 days. Reprots similar sx in the past with episodes of pancreatitis.  He is notes his gabapentin dose was recently increased and he wonders if this may have set off his pancreatitis. Feels "like someone punched me under my ribcage."  He has hx of gallstones and systemic Lyme disease which he reports is the cause of his pancreatitis.  Endorses decreased appetite and nausea. He altered his diet as he fears this is another episode of pancreatitis.    PERTINENT  PMH / PSH: reviewed and updated as appropriate   OBJECTIVE:   BP (!) 123/93    Pulse 89    Ht 6' (1.829 m)    Wt 239 lb (108.4 kg)    SpO2 98%    BMI 32.41 kg/m    GEN: pleasant uncomfortable appearing male CV: regular rate and rhythm RESP: no increased work of breathing, clear to ascultation bilaterally, good air movement  ABD: Bowel sounds present. Soft, non-distended, LUQ tenderness, no CVA tenderness  MSK: normal ROM of left shoulder  SKIN: warm, dry, no overlying skin changes     ASSESSMENT/PLAN:   Flank and Abdominal Pain  Reviewed previous CT ABD/Pelvis and there was no pancreatitis at that time. Reports hx of pancreatitis in the past. He is s/p cholecystectomy. Obtain CBC, CMP and lipase. Doubt PTX given location of pain. Possibly kidney stone. Not likely increased gabapentin is contributing. Advised CLD and advance diet as tolerated. Offered zofran for nausea but pt declined. ED precautions given and pt voiced understanding.   Katha Cabal, DO PGY-3, [facility] 05/13/2021

## 2021-05-13 NOTE — Therapy (Signed)
[name] [address] [place], [place], [address] Phone: [phone]   Fax:  [phone]  Occupational Therapy Treatment  Patient Details  Name: [name] MRN: [id number] Date of Birth: April 22, 1971 Referring Provider (OT): Dr. Sheliah Hatch   Encounter Date: 05/13/2021   OT End of Session - 05/13/21 1410     Visit Number 11    Number of Visits 20    Date for OT Re-Evaluation 06/12/21    Authorization Type [employer]; covered 100%    OT Start Time 1319    OT Stop Time 1403    OT Time Calculation (min) 44 min    Activity Tolerance Patient limited by pain    Behavior During Therapy [facility] for tasks assessed/performed             Past Medical History:  Diagnosis Date   Angio-edema    Anxiety    Disseminated Lyme disease    Dystonia    Foot drop, right 2000   Headache    Hypertension 2019   Lyme disease    Neck pain    Rocky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    Urticaria     Past Surgical History:  Procedure Laterality Date   ADENOIDECTOMY     ANTERIOR CERVICAL DECOMPRESSION/DISCECTOMY FUSION 4 LEVELS N/A 01/20/2021   Procedure: ANTERIOR CERVICAL DECOMPRESSION/DISCECTOMY FUSION 3  LEVELS C5-T1;  Surgeon: Venita Lick, MD;  Location: [facility] OR;  Service: Orthopedics;  Laterality: N/A;   BACK SURGERY     SACROILIAC JOINT FUSION Right 11/15/2017   Procedure: SACROILIAC JOINT FUSION;  Surgeon: Venita Lick, MD;  Location: [facility] OR;  Service: Orthopedics;  Laterality: Right;  120 mins   SACROILIAC JOINT FUSION Left 07/25/2018   Procedure: SACROILIAC JOINT FUSION;  Surgeon: Venita Lick, MD;  Location: [facility] OR;  Service: Orthopedics;  Laterality: Left;  SACROILIAC JOINT FUSION   TONSILLECTOMY     50 years old    There were no vitals filed for this visit.   Subjective Assessment - 05/13/21 1340     Subjective  Pt reports that he has been using TENS regularly for hand and neck.  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 Yes    Pain Score 3     Pain Location Wrist   thumb   Pain Orientation Right;Left    Pain Descriptors / Indicators Pressure;Sharp    Pain Type Chronic pain    Pain Onset More than a month ago    Pain Frequency Constant    Aggravating Factors  use    Pain Relieving Factors medications, stretching               Ultrasound x 6 min Lt volar forearm (20% pulsed, 3 Mhz, 0.8 wts/cm2), followed by 4 min in Lt palm same parameters = 10 min total with no adverse reactions  NMES x 10 min LUE for wrist/finger extension and thumb abduction 50pps, 250 pulse width, 10sec cycle, intensity=29 with no adverse reactions and improved finger/thumb ext today.   PROM to L thumb in palmar and radial abduction and ext as well as wrist ext as able.  Reviewed  wt. Bearing through L hand on table for full composite wrist/finger and thumb ext. stretch  L thumb active opposition and abduction/ext as able with splint on.          OT Education - 05/13/21 1413     Education Details Recommended pt try to perform light thumb opposition to 2-3rd digits only with L hand and then try to actively extend.  Continued with recommendations for AE/strategies for ADLs including:  use of shelf liner to help stabilize objects/pick up objects//build up ojects/open items, zipper pull vs. use of paperclip for zipper, trial of use of rubber band for pants button so he wouldn't have to carry button hook around, but pt did better with button hook (just received one for home).    Person(s) Educated Patient    Methods Explanation;Verbal cues;Demonstration    Comprehension Verbalized understanding;Returned demonstration               OT Short Term Goals - 05/13/21 1414       OT SHORT TERM GOAL #1   Title Independent with daytime and pm bilateral splints to improve positioning, function, and pain    Time 4    Period Weeks    Status Achieved      OT SHORT TERM GOAL #2   Title Independent with initial HEP    Time 4    Period Weeks    Status Achieved      OT SHORT TERM GOAL #3   Title Pt to verbalize understanding with A/E recommendations and task modifications to increase independence with ADLS (including cutting food, writing, dressing/buttons & belt, bathing, etc)    Time 4    Period Weeks    Status On-going               OT Long Term Goals - 03/31/21 1454       OT LONG TERM GOAL #1   Title Pt independent with updated HEP    Time 8    Period Weeks    Status New      OT LONG TERM GOAL #2   Title Pt to improve BUE hand function as evidenced by increasing Box & Blocks score by 6    Baseline Rt = 17, Lt = 16    Time 8    Period Weeks    Status New      OT LONG TERM GOAL #3   Title Pt overall mod assist or less for BADLS using A/E prn    Time 8    Period Weeks    Status New      OT LONG TERM GOAL #4   Title Pt to report pain 5/10 or under bilateral thumbs    Baseline 8/10    Time 8    Period Weeks    Status New      OT LONG TERM GOAL #5   Title Pt to demo 75% composite extension Lt hand in prep for functional tasks/grasping    Time 8    Period Weeks    Status New                   Plan - 05/13/21 1410     Clinical Impression Statement Pt reports overall decr pain and not as sharp today (neurontin was increased).   Pt reports improvement overall in bilateral hands and demo incr ability to extend L hand, but inconsistent ability to thumb abduction due to muscle spasms.    OT Occupational  Profile and History Detailed Assessment- Review of Records and additional review of physical, cognitive, psychosocial history related to current functional performance     Occupational performance deficits (Please refer to evaluation for details): ADL's;IADL's;Leisure;Social Participation;Work    Games developer / Function / Physical Skills ADL;Coordination;GMC;Endurance;UE functional use;Sensation;Body mechanics;Flexibility;IADL;Pain;FMC;Dexterity;Strength;Edema;Mobility;ROM    Rehab Potential Fair   time since onset, severity of pain   Clinical Decision Making Several treatment options, min-mod task modification necessary    Comorbidities Affecting Occupational Performance: Presence of comorbidities impacting occupational performance    Comorbidities impacting occupational performance description: Lymes disease    Modification or Assistance to Complete Evaluation  Min-Moderate modification of tasks or assist with assess necessary to complete eval    OT Frequency 2x / week    OT Duration --   10 weeks (may only need 8 weeks)   OT Treatment/Interventions Self-care/ADL training;Cryotherapy;Therapeutic exercise;DME and/or AE instruction;Functional Mobility Training;Cognitive remediation/compensation;Ultrasound;Manual Therapy;Splinting;Passive range of motion;Therapeutic activities;Patient/family education;Coping strategies training    Plan continue [place] for Lt palm/forearm and NMES for wrist and finger ext, functional use of hands, ?strategy for belt/explore other objects (and check goal), ?check box and blocks    Consulted and Agree with Plan of Care Patient             Patient will benefit from skilled therapeutic intervention in order to improve the following deficits and impairments:   Body Structure / Function / Physical Skills: ADL, Coordination, GMC, Endurance, UE functional use, Sensation, Body mechanics, Flexibility, IADL, Pain, FMC, Dexterity, Strength, Edema, Mobility, ROM       Visit Diagnosis: Pain in left hand  Stiffness of left hand, not elsewhere classified  Stiffness of right hand, not elsewhere classified  Muscle weakness  (generalized)  Other lack of coordination  Pain in right hand    Problem List Patient Active Problem List   Diagnosis Date Noted   Depressed mood 05/08/2021   Pain in both hands 05/08/2021   Pain of right thumb 04/12/2021   Other spondylosis with myelopathy, cervical region 02/02/2021   Low back pain 01/19/2021   Swelling of both hands 12/22/2020   Neck pain on left side 09/30/2020   Gait abnormality 09/30/2020   Left-sided weakness 09/01/2020   History of Lyme disease 09/01/2020   Abdominal pain, chronic, epigastric 08/18/2020   Unintentional weight loss 08/18/2020   Chronic constipation 08/18/2020   History of pancreatitis 08/18/2020   Family history of colon cancer 08/18/2020   Sinusitis 06/07/2020   Pancreatitis 05/21/2020   Cervical radiculopathy 05/14/2020   Rash and other nonspecific skin eruption 01/09/2020   Essential hypertension 01/03/2020   Vitamin D deficiency 11/26/2019   Hyperglycemia 11/26/2019   Tobacco use 10/11/2019   Hyperlipemia    Fatty liver 05/06/2019   S/P lumbar fusion 11/15/2017   Labral tear of shoulder, degenerative, left 02/21/2017   Elevated LFTs 01/09/2017   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 [place], [place]  [address] [phone] phone [phone] 05/13/21 2:31 PM

## 2021-05-14 ENCOUNTER — Encounter: Payer: Self-pay | Admitting: Family Medicine

## 2021-05-14 LAB — COMPREHENSIVE METABOLIC PANEL
ALT: 23 IU/L (ref 0–44)
AST: 20 IU/L (ref 0–40)
Albumin/Globulin Ratio: 2.2 (ref 1.2–2.2)
Albumin: 5.1 g/dL — ABNORMAL HIGH (ref 4.0–5.0)
Alkaline Phosphatase: 89 IU/L (ref 44–121)
BUN/Creatinine Ratio: 9 (ref 9–20)
BUN: 10 mg/dL (ref 6–24)
Bilirubin Total: 0.4 mg/dL (ref 0.0–1.2)
CO2: 23 mmol/L (ref 20–29)
Calcium: 10.4 mg/dL — ABNORMAL HIGH (ref 8.7–10.2)
Chloride: 103 mmol/L (ref 96–106)
Creatinine, Ser: 1.1 mg/dL (ref 0.76–1.27)
Globulin, Total: 2.3 g/dL (ref 1.5–4.5)
Glucose: 80 mg/dL (ref 70–99)
Potassium: 5.2 mmol/L (ref 3.5–5.2)
Sodium: 146 mmol/L — ABNORMAL HIGH (ref 134–144)
Total Protein: 7.4 g/dL (ref 6.0–8.5)
eGFR: 82 mL/min/{1.73_m2} (ref >59)

## 2021-05-14 LAB — CBC
Hematocrit: 46.4 % (ref 37.5–51.0)
Hemoglobin: 15.5 g/dL (ref 13.0–17.7)
MCH: 29.8 pg (ref 26.6–33.0)
MCHC: 33.4 g/dL (ref 31.5–35.7)
MCV: 89 fL (ref 79–97)
Platelets: 227 10*3/uL (ref 150–450)
RBC: 5.21 x10E6/uL (ref 4.14–5.80)
RDW: 12.8 % (ref 11.6–15.4)
WBC: 9.9 10*3/uL (ref 3.4–10.8)

## 2021-05-14 LAB — LIPASE: Lipase: 17 U/L (ref 13–78)

## 2021-05-17 ENCOUNTER — Ambulatory Visit: Payer: 59 | Admitting: Occupational Therapy

## 2021-05-19 ENCOUNTER — Ambulatory Visit: Payer: 59 | Admitting: Occupational Therapy

## 2021-05-20 ENCOUNTER — Emergency Department (HOSPITAL_COMMUNITY)
Admission: EM | Admit: 2021-05-20 | Discharge: 2021-05-21 | Disposition: A | Discharge status: Home / Self Care | Payer: 59 | Attending: Emergency Medicine | Admitting: Emergency Medicine

## 2021-05-20 ENCOUNTER — Other Ambulatory Visit: Payer: Self-pay

## 2021-05-20 DIAGNOSIS — Z79899 Other long term (current) drug therapy: Secondary | ICD-10-CM | POA: Insufficient documentation

## 2021-05-20 DIAGNOSIS — M25559 Pain in unspecified hip: Secondary | ICD-10-CM | POA: Insufficient documentation

## 2021-05-20 DIAGNOSIS — I10 Essential (primary) hypertension: Secondary | ICD-10-CM | POA: Insufficient documentation

## 2021-05-20 DIAGNOSIS — R509 Fever, unspecified: Secondary | ICD-10-CM | POA: Diagnosis present

## 2021-05-20 DIAGNOSIS — F17210 Nicotine dependence, cigarettes, uncomplicated: Secondary | ICD-10-CM | POA: Insufficient documentation

## 2021-05-20 DIAGNOSIS — U071 COVID-19: Secondary | ICD-10-CM | POA: Diagnosis not present

## 2021-05-20 DIAGNOSIS — R197 Diarrhea, unspecified: Secondary | ICD-10-CM | POA: Insufficient documentation

## 2021-05-20 DIAGNOSIS — R7401 Elevation of levels of liver transaminase levels: Secondary | ICD-10-CM | POA: Insufficient documentation

## 2021-05-20 LAB — COMPREHENSIVE METABOLIC PANEL
ALT: 47 U/L — ABNORMAL HIGH (ref 0–44)
AST: 57 U/L — ABNORMAL HIGH (ref 15–41)
Albumin: 4.4 g/dL (ref 3.5–5.0)
Alkaline Phosphatase: 69 U/L (ref 38–126)
Anion gap: 8 (ref 5–15)
BUN: 10 mg/dL (ref 6–20)
CO2: 26 mmol/L (ref 22–32)
Calcium: 9.6 mg/dL (ref 8.9–10.3)
Chloride: 104 mmol/L (ref 98–111)
Creatinine, Ser: 1.15 mg/dL (ref 0.61–1.24)
GFR, Estimated: >60 mL/min (ref >60)
Glucose, Bld: 97 mg/dL (ref 70–99)
Potassium: 4.1 mmol/L (ref 3.5–5.1)
Sodium: 138 mmol/L (ref 135–145)
Total Bilirubin: 0.6 mg/dL (ref 0.3–1.2)
Total Protein: 7.5 g/dL (ref 6.5–8.1)

## 2021-05-20 LAB — CBC WITH DIFFERENTIAL/PLATELET
Abs Immature Granulocytes: 0.02 10*3/uL (ref 0.00–0.07)
Basophils Absolute: 0 10*3/uL (ref 0.0–0.1)
Basophils Relative: 1 %
Eosinophils Absolute: 0 10*3/uL (ref 0.0–0.5)
Eosinophils Relative: 0 %
HCT: 46.8 % (ref 39.0–52.0)
Hemoglobin: 15.1 g/dL (ref 13.0–17.0)
Immature Granulocytes: 0 %
Lymphocytes Relative: 25 %
Lymphs Abs: 1.6 10*3/uL (ref 0.7–4.0)
MCH: 29.5 pg (ref 26.0–34.0)
MCHC: 32.3 g/dL (ref 30.0–36.0)
MCV: 91.4 fL (ref 80.0–100.0)
Monocytes Absolute: 1.1 10*3/uL — ABNORMAL HIGH (ref 0.1–1.0)
Monocytes Relative: 17 %
Neutro Abs: 3.7 10*3/uL (ref 1.7–7.7)
Neutrophils Relative %: 57 %
Platelets: 192 10*3/uL (ref 150–400)
RBC: 5.12 MIL/uL (ref 4.22–5.81)
RDW: 13.3 % (ref 11.5–15.5)
WBC: 6.5 10*3/uL (ref 4.0–10.5)
nRBC: 0 % (ref 0.0–0.2)

## 2021-05-20 MED ORDER — OXYCODONE-ACETAMINOPHEN 5-325 MG PO TABS
1.0000 | ORAL_TABLET | Freq: Once | ORAL | Status: AC
  Administered 2021-05-20: 22:00:00 1 via ORAL
  Filled 2021-05-20: qty 1

## 2021-05-20 NOTE — ED Triage Notes (Addendum)
Pt c/o fever, hip pain and neck pain. Pt reports he tested positive for covid 2 days ago.

## 2021-05-20 NOTE — ED Provider Notes (Signed)
Emergency Medicine Provider Triage Evaluation Note  CLINE DRAHEIM III , a 50 y.o. male  was evaluated in triage.  Pt complains of fever, chills, generalized myalgia, bilateral hip pain, and neck pain.  States that he tested positive for COVID-19 on a home test 2 days prior.  Patient has been having fever, chills, generalized myalgias.  States that he has developed pain to bilateral hips and his neck.  Denies any recent falls or injuries.  States that he has had surgeries to both hips and his cervical neck.  Cervical neck surgery was 4 weeks prior..  Review of Systems  Positive: Neck pain, bilateral hip pain, fever, chills, generalized myalgia, cough Negative: Numbness, weakness, facial asymmetry, dysarthria  Physical Exam  BP (!) 127/94 (BP Location: Right Arm)    Pulse 91    Temp 98.2 F (36.8 C) (Oral)    Resp 20    Ht 6' (1.829 m)    Wt 108.4 kg    SpO2 99%    BMI 32.41 kg/m  Gen:   Awake, no distress   Resp:  Normal effort, lungs clear to auscultation bilaterally MSK:   Moves extremities without difficulty  Other:  Tenderness to bilateral cervical paraspinous muscles.  Decreased range of motion to cervical neck secondary to complaints of pain.  Medical Decision Making  Medically screening exam initiated at 9:07 PM.  Appropriate orders placed.  Dola Argyle Grainger III was informed that the remainder of the evaluation will be completed by another provider, this initial triage assessment does not replace that evaluation, and the importance of remaining in the ED until their evaluation is complete.  Will obtain basic lab work and COVID-19/influenza testing.   Berneice Heinrich 05/20/21 2109    Glendora Score, MD 05/20/21 [phone]

## 2021-05-21 LAB — RESP PANEL BY RT-PCR (FLU A&B, COVID) ARPGX2
Influenza A by PCR: NEGATIVE
Influenza B by PCR: NEGATIVE
SARS Coronavirus 2 by RT PCR: POSITIVE — AB

## 2021-05-21 MED ORDER — PAXLOVID (300/100) 20 X 150 MG & 10 X 100MG PO TBPK: ORAL_TABLET | ORAL | 0 refills | Status: DC

## 2021-05-21 NOTE — ED Provider Notes (Signed)
[facility] EMERGENCY DEPARTMENT Provider Note   CSN: [id number] Arrival date & time: 05/20/21  2025     History Chief Complaint  Patient presents with   Fever   Hip Pain   Neck Pain    [name] is a 50 y.o. male.  The history is provided by the patient.  Fever Hip Pain  Neck Pain Associated symptoms: fever   He has history of hypertension, hyperlipidemia, disseminated Lyme disease and comes in for 3-day history of fever, chills, body aches.  Fever has been as high as 104.1.  Pain has been most severe in areas where he has had surgery which includes his neck, hips.  He has had sick contacts with documented COVID-19.  He has had some clear rhinorrhea and a cough productive of clear sputum.  There has been a sore throat.  He denies dyspnea.  There has been some nausea but no vomiting.  He has had diarrhea.   Past Medical History:  Diagnosis Date   Angio-edema    Anxiety    Disseminated Lyme disease    Dystonia    Foot drop, right 2000   Headache    Hypertension 2019   Lyme disease    Neck pain    Rocky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    Urticaria     Patient Active Problem List   Diagnosis Date Noted   Depressed mood 05/08/2021   Pain in both hands 05/08/2021   Pain of right thumb 04/12/2021   Other spondylosis with myelopathy, cervical region 02/02/2021   Low back pain 01/19/2021   Swelling of both hands 12/22/2020   Neck pain on left side 09/30/2020   Gait abnormality 09/30/2020   Left-sided weakness 09/01/2020   History of Lyme disease 09/01/2020   Abdominal pain, chronic, epigastric 08/18/2020   Unintentional weight loss 08/18/2020   Chronic constipation 08/18/2020   History of pancreatitis 08/18/2020   Family history of colon cancer 08/18/2020   Sinusitis 06/07/2020   Pancreatitis 05/21/2020   Cervical radiculopathy 05/14/2020   Rash and other nonspecific skin eruption 01/09/2020   Essential hypertension 01/03/2020   Vitamin D  deficiency 11/26/2019   Hyperglycemia 11/26/2019   Tobacco use 10/11/2019   Hyperlipemia    Fatty liver 05/06/2019   S/P lumbar fusion 11/15/2017   Labral tear of shoulder, degenerative, left 02/21/2017   Elevated LFTs 01/09/2017   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    Past Surgical History:  Procedure Laterality Date   ADENOIDECTOMY     ANTERIOR CERVICAL DECOMPRESSION/DISCECTOMY FUSION 4 LEVELS N/A 01/20/2021   Procedure: ANTERIOR CERVICAL DECOMPRESSION/DISCECTOMY FUSION 3  LEVELS C5-T1;  Surgeon: Venita Lick, MD;  Location: [facility] OR;  Service: Orthopedics;  Laterality: N/A;   BACK SURGERY     SACROILIAC JOINT FUSION Right 11/15/2017   Procedure: SACROILIAC JOINT FUSION;  Surgeon: Venita Lick, MD;  Location: [facility] OR;  Service: Orthopedics;  Laterality: Right;  120 mins   SACROILIAC JOINT FUSION Left 07/25/2018   Procedure: SACROILIAC JOINT FUSION;  Surgeon: Venita Lick, MD;  Location: [facility] OR;  Service: Orthopedics;  Laterality: Left;  SACROILIAC JOINT FUSION   TONSILLECTOMY     50 years old       Family History  Problem Relation Age of Onset   Heart disease Mother    Heart disease Father    Colon polyps Father    ALS Sister    Osteoarthritis Brother    Colon cancer Paternal Grandfather  dx at age 58   Healthy Son    Healthy Son    Esophageal cancer Neg Hx    Inflammatory bowel disease Neg Hx    Liver disease Neg Hx    Pancreatic cancer Neg Hx    Stomach cancer Neg Hx     Social History   Tobacco Use   Smoking status: Every Day    Packs/day: 0.50    Years: 30.00    Pack years: 15.00    Types: Cigarettes   Smokeless tobacco: Never  Vaping Use   Vaping Use: Never used  Substance Use Topics   Alcohol use: Not Currently    Comment: stopped 2018   Drug use: No    Home Medications Prior to Admission medications   Medication Sig Start Date End Date Taking? Authorizing Provider  famotidine (PEPCID) 20 MG tablet Take 1 tablet (20 mg total) by  mouth as needed. 05/11/21   Autry-Lott, Randa Evens, DO  fexofenadine (ALLEGRA ALLERGY) 180 MG tablet Take 1 tablet (180 mg total) by mouth daily. 05/11/21   Autry-Lott, Randa Evens, DO  fluticasone (FLONASE) 50 MCG/ACT nasal spray SHAKE LIQUID AND USE 2 SPRAYS IN EACH NOSTRIL DAILY 02/04/21   Autry-Lott, Randa Evens, DO  gabapentin (NEURONTIN) 300 MG capsule Take 2 capsules (600 mg total) by mouth 2 (two) times daily. 05/07/21   Autry-Lott, Randa Evens, DO  montelukast (SINGULAIR) 10 MG tablet Take 10 mg by mouth daily. 01/10/21   [provider]  ondansetron (ZOFRAN) 4 MG tablet Take 1 tablet (4 mg total) by mouth every 8 (eight) hours as needed for nausea or vomiting. 01/20/21   Venita Lick, MD  pseudoephedrine (SUDAFED) 120 MG 12 hr tablet Take 120 mg by mouth daily.    [provider]  rosuvastatin (CRESTOR) 20 MG tablet TAKE 1 TABLET(20 MG) BY MOUTH DAILY 12/11/20   Autry-Lott, Randa Evens, DO  valsartan (DIOVAN) 40 MG tablet Take 1 tablet (40 mg total) by mouth daily. Patient need a appointment for future refills. 05/10/21   Runell Gess, MD    Allergies    Pregabalin, Sulfa antibiotics, and Gabapentin  Review of Systems   Review of Systems  Constitutional:  Positive for fever.  Musculoskeletal:  Positive for neck pain.  All other systems reviewed and are negative.  Physical Exam Updated Vital Signs BP 123/85    Pulse 80    Temp 97.9 F (36.6 C) (Oral)    Resp 17    Ht 6' (1.829 m)    Wt 108.4 kg    SpO2 100%    BMI 32.41 kg/m   Physical Exam Vitals and nursing note reviewed.  50 year old male, resting comfortably and in no acute distress. Vital signs are normal. Oxygen saturation is 100%, which is normal. Head is normocephalic and atraumatic. PERRLA, EOMI. Oropharynx is clear. Neck is nontender and supple without adenopathy or JVD. Back is nontender and there is no CVA tenderness. Lungs are clear without rales, wheezes, or rhonchi. Chest is nontender. Heart has regular rate and  rhythm without murmur. Abdomen is soft, flat, nontender. Extremities have no cyanosis or edema, full range of motion is present. Skin is warm and dry without rash. Neurologic: Mental status is normal, cranial nerves are intact, moves all extremities equally.  ED Results / Procedures / Treatments   Labs (all labs ordered are listed, but only abnormal results are displayed) Labs Reviewed  RESP PANEL BY RT-PCR (FLU A&B, COVID) ARPGX2 - Abnormal; Notable for the following components:  Result Value   SARS Coronavirus 2 by RT PCR POSITIVE (*)    All other components within normal limits  COMPREHENSIVE METABOLIC PANEL - Abnormal; Notable for the following components:   AST 57 (*)    ALT 47 (*)    All other components within normal limits  CBC WITH DIFFERENTIAL/PLATELET - Abnormal; Notable for the following components:   Monocytes Absolute 1.1 (*)    All other components within normal limits   Procedures Procedures   Medications Ordered in ED Medications  oxyCODONE-acetaminophen (PERCOCET/ROXICET) 5-325 MG per tablet 1 tablet (1 tablet Oral Given 05/20/21 2132)    ED Course  I have reviewed the triage vital signs and the nursing notes.  Pertinent lab results that were available during my care of the patient were reviewed by me and considered in my medical decision making (see chart for details).   MDM Rules/Calculators/A&P                         Flulike illness and patient with exposure to COVID-19.  Respiratory pathogen panel is positive for COVID-19.  Labs are significant only for mild elevation of transaminases of uncertain clinical significance, probably related to COVID infection.  WBC is normal.  Patient is nontoxic in appearance.  However, with history of disseminated Lyme disease, he is at risk for more severe disease, so is discharged with prescription for Paxlovid.  Old records are reviewed, and he has no relevant past visits.  Final Clinical Impression(s) / ED  Diagnoses Final diagnoses:  COVID-19 virus infection  Elevated transaminase measurement    Rx / DC Orders ED Discharge Orders          Ordered    nirmatrelvir & ritonavir (PAXLOVID, 300/100,) 20 x 150 MG & 10 x 100MG  TBPK        05/21/21 0245             05/23/21, MD 05/21/21 [phone]

## 2021-05-21 NOTE — Discharge Instructions (Addendum)
Drink plenty of fluids.  Take ibuprofen or naproxen as needed for fever or pain.  To get additional fever or pain relief, add acetaminophen.  Return if you develop difficulty breathing or any other new or concerning symptoms.

## 2021-05-21 NOTE — ED Notes (Signed)
Patient in appropriate seating area 

## 2021-05-22 ENCOUNTER — Encounter: Payer: Self-pay | Admitting: Family Medicine

## 2021-05-24 ENCOUNTER — Encounter: Payer: Self-pay | Admitting: Occupational Therapy

## 2021-05-25 ENCOUNTER — Ambulatory Visit: Payer: 59 | Admitting: Occupational Therapy

## 2021-05-27 ENCOUNTER — Ambulatory Visit: Payer: 59 | Admitting: Occupational Therapy

## 2021-06-02 ENCOUNTER — Ambulatory Visit: Payer: 59 | Admitting: Occupational Therapy

## 2021-06-11 ENCOUNTER — Other Ambulatory Visit: Payer: Self-pay

## 2021-06-11 ENCOUNTER — Encounter: Payer: Self-pay | Admitting: Family Medicine

## 2021-06-11 ENCOUNTER — Ambulatory Visit (INDEPENDENT_AMBULATORY_CARE_PROVIDER_SITE_OTHER): Payer: Managed Care, Other (non HMO) | Admitting: Family Medicine

## 2021-06-11 VITALS — BP 110/60 | HR 94 | Ht 72.0 in | Wt 244.1 lb

## 2021-06-11 DIAGNOSIS — R300 Dysuria: Secondary | ICD-10-CM

## 2021-06-11 DIAGNOSIS — R7401 Elevation of levels of liver transaminase levels: Secondary | ICD-10-CM

## 2021-06-11 NOTE — Progress Notes (Signed)
° ° °  SUBJECTIVE:   CHIEF COMPLAINT / HPI:   He is concerned about his kidney function and liver enzymes.  He had mild elevation in his liver enzymes when he was in the ED last month for COVID infection.  He saw his pain medicine doctor yesterday who has him on pain medications.  He reports intermittent dysuria.  He states he passed a kidney stone on the left side about a month ago.  He thinks he may be passing 1 on his right side.  Denies hematuria.  He reports history of disseminated Lyme disease requiring hospitalization.  This was in 2018.  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  He is under a lot of stress and anxiety from his medical issues stemming from Lyme disease.  He does see a pain psychologist which has been helpful.  PERTINENT  PMH / PSH: Lyme disease  OBJECTIVE:   BP 110/60    Pulse 94    Ht 6' (1.829 m)    Wt 244 lb 2 oz (110.7 kg)    SpO2 97%    BMI 33.11 kg/m   General: Alert, NAD CV: RRR, no murmurs Pulm: CTAB, no wheezes or rales Abdomen: Soft, nontender  Depression screen PHQ 2/9 06/11/2021  Decreased Interest 1  Down, Depressed, Hopeless 2  PHQ - 2 Score 3  Altered sleeping 3  Tired, decreased energy 3  Change in appetite 3  Feeling bad or failure about yourself  0  Trouble concentrating 2  Moving slowly or fidgety/restless 0  Suicidal thoughts 0  PHQ-9 Score 14  Difficult doing work/chores Extremely dIfficult  Some recent data might be hidden     ASSESSMENT/PLAN:     Prentis was seen today for blood work.  Diagnoses and all orders for this visit:  Transaminitis -     Comprehensive metabolic panel to ensure resolution  Dysuria Possible kidney stone, he is already on pain medications. Check for UTI. -     POCT URINALYSIS DIP (CLINITEK)  Unfortunately order was not placed at the time urine was collected, will reach out to patient to re-collect if still symptomatic.     Zola Button, MD [place]

## 2021-06-11 NOTE — Patient Instructions (Addendum)
It was nice seeing you today!  Blood tests and urine today.  Stay well, Garrett Deeds, MD [facility] [phone]  --  Make sure to check out at the front desk before you leave today.  Please arrive at least 15 minutes prior to your scheduled appointments.  If you had blood work today, I will send you a MyChart message or a letter if results are normal. Otherwise, I will give you a call.  If you had a referral placed, they will call you to set up an appointment. Please give [place] a call if you don't hear back in the next 2 weeks.  If you need additional refills before your next appointment, please call your pharmacy first.

## 2021-06-12 LAB — COMPREHENSIVE METABOLIC PANEL
ALT: 24 IU/L (ref 0–44)
AST: 24 IU/L (ref 0–40)
Albumin/Globulin Ratio: 2 (ref 1.2–2.2)
Albumin: 5.1 g/dL — ABNORMAL HIGH (ref 4.0–5.0)
Alkaline Phosphatase: 80 IU/L (ref 44–121)
BUN/Creatinine Ratio: 11 (ref 9–20)
BUN: 12 mg/dL (ref 6–24)
Bilirubin Total: 0.5 mg/dL (ref 0.0–1.2)
CO2: 17 mmol/L — ABNORMAL LOW (ref 20–29)
Calcium: 9.7 mg/dL (ref 8.7–10.2)
Chloride: 102 mmol/L (ref 96–106)
Creatinine, Ser: 1.09 mg/dL (ref 0.76–1.27)
Globulin, Total: 2.6 g/dL (ref 1.5–4.5)
Glucose: 75 mg/dL (ref 70–99)
Potassium: 4.3 mmol/L (ref 3.5–5.2)
Sodium: 139 mmol/L (ref 134–144)
Total Protein: 7.7 g/dL (ref 6.0–8.5)
eGFR: 83 mL/min/{1.73_m2} (ref >59)

## 2021-06-14 ENCOUNTER — Encounter: Payer: Self-pay | Admitting: Family Medicine

## 2021-07-05 ENCOUNTER — Other Ambulatory Visit: Payer: Self-pay | Admitting: Cardiovascular Disease

## 2021-07-05 DIAGNOSIS — I10 Essential (primary) hypertension: Secondary | ICD-10-CM

## 2021-07-05 NOTE — Progress Notes (Deleted)
Office Visit Note  Patient: [name]             Date of Birth: 1970-07-11           MRN: [id number]             PCP: Gerlene Fee, DO Referring: Gerlene Fee, DO Visit Date: 07/06/2021   Subjective:  No chief complaint on file.   History of Present Illness: [name] is a 51 y.o. male here for follow up for joint pain with OA and history of disseminated lyme disease after last visit trial of right thumb steroid injection and continuing OT treatment. He was ill with COVID infection. He also has cervical radiculopathy and ulnar neuropathy symptoms and saw [facility] for follow up.***   Previous HPI 04/12/21 [name] is a 51 y.o. male here for follow up due to increased pain and difficulty using his left thumb with frequent locking up and severely worse right thumb pain as well. He does not recall a specific starting incident with this joint pain but now has severe pain especially with pinching and gripping motions. He did not see significantly increased swelling.   Previous HPI 01/19/21 [name] is a 51 y.o. male here for chronic pain and swelling of bilateral hands. He has had joint pains and inflammation since at least 2018 when he was evaluated with [facility] healthcare system including rheumatology and infectious disease workups. These were broadly negative for numerous serology including RF, CCP, ANA, and B. Burgdorferi Abs. He completed treatments with ceftriaxone and doxycycline at the time and no ongoing infectious concerns described by ID clinic. Rheumatology discussion of possible trying conventional DMARD in additional to chronic NSAID use. He has had ongoing subsequent orthopedics care with numerous degenerative and postraumatic changes in his cervical and lumbar spine and shoulders and previous SI joint fusion.  He was scheduled for anterior cervical spine decompression surgery urgency planned for tomorrow but  apparently being postponed due to abnormal clotting factor test. Currently his worst complaint is increasing pain swelling and stiffness affecting his bilateral hands and wrist severely limiting his activities.  Pain is worst on the thumb and around the MCP joints right hand worse than left.  He has trouble gripping objects tightly and when he uses his hands a lot during the day will experience worsening symptoms for up to 3 days afterwards.  Occasionally there is redness overlying the affected joints when they are in exacerbation but otherwise often appears normal.  He is also developed difficulty fully extending his fingers especially the fourth and fifth fingers of his left hand.  His thumb catches in a fixed position on the right side requiring use of his other hand or an object to restore motion.   No Rheumatology ROS completed.   PMFS History:  Patient Active Problem List   Diagnosis Date Noted   Depressed mood 05/08/2021   Pain in both hands 05/08/2021   Pain of right thumb 04/12/2021   Other spondylosis with myelopathy, cervical region 02/02/2021   Low back pain 01/19/2021   Swelling of both hands 12/22/2020   Neck pain on left side 09/30/2020   Gait abnormality 09/30/2020   Left-sided weakness 09/01/2020   History of Lyme disease 09/01/2020   Abdominal pain, chronic, epigastric 08/18/2020   Unintentional weight loss 08/18/2020   Chronic constipation 08/18/2020   History of pancreatitis 08/18/2020   Family history of colon cancer 08/18/2020  Sinusitis 06/07/2020   Pancreatitis 05/21/2020   Cervical radiculopathy 05/14/2020   Rash and other nonspecific skin eruption 01/09/2020   Essential hypertension 01/03/2020   Vitamin D deficiency 11/26/2019   Hyperglycemia 11/26/2019   Tobacco use 10/11/2019   Hyperlipemia    Fatty liver 05/06/2019   S/P lumbar fusion 11/15/2017   Labral tear of shoulder, degenerative, left 02/21/2017   Elevated LFTs 01/09/2017   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 01/05/2017    Past Medical History:  Diagnosis Date   Angio-edema    Anxiety    Disseminated Lyme disease    Dystonia    Foot drop, right 2000   Headache    Hypertension 2019   Lyme disease    Neck pain    Rocky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    Urticaria     Family History  Problem Relation Age of Onset   Heart disease Mother    Heart disease Father    Colon polyps Father    ALS Sister    Osteoarthritis Brother    Colon cancer Paternal Grandfather        dx at age 37   Healthy Son    Healthy Son    Esophageal cancer Neg Hx    Inflammatory bowel disease Neg Hx    Liver disease Neg Hx    Pancreatic cancer Neg Hx    Stomach cancer Neg Hx    Past Surgical History:  Procedure Laterality Date   ADENOIDECTOMY     ANTERIOR CERVICAL DECOMPRESSION/DISCECTOMY FUSION 4 LEVELS N/A 01/20/2021   Procedure: ANTERIOR CERVICAL DECOMPRESSION/DISCECTOMY FUSION 3  LEVELS C5-T1;  Surgeon: Melina Schools, MD;  Location: [place];  Service: Orthopedics;  Laterality: N/A;   BACK SURGERY     SACROILIAC JOINT FUSION Right 11/15/2017   Procedure: SACROILIAC JOINT FUSION;  Surgeon: Melina Schools, MD;  Location: [place];  Service: Orthopedics;  Laterality: Right;  120 mins   SACROILIAC JOINT FUSION Left 07/25/2018   Procedure: SACROILIAC JOINT FUSION;  Surgeon: Melina Schools, MD;  Location: [place];  Service: Orthopedics;  Laterality: Left;  SACROILIAC JOINT FUSION   TONSILLECTOMY     51 years old   Social History   Social History Narrative   Lives at home with his wife.   Right-handed.   Caffeine use: two cans of [facility].   Immunization History  Administered Date(s) Administered   Influenza Split 01/09/2017   Influenza,inj,quad, With Preservative 01/09/2017   Tdap 11/16/2004     Objective: Vital Signs: There were no vitals taken for this visit.   Physical Exam   Musculoskeletal Exam: ***  CDAI Exam: CDAI Score: -- Patient Global: --; Provider Global: -- Swollen: --;  Tender: -- Joint Exam 07/06/2021   No joint exam has been documented for this visit   There is currently no information documented on the homunculus. Go to the Rheumatology activity and complete the homunculus joint exam.  Investigation: No additional findings.  Imaging: No results found.  Recent Labs: Lab Results  Component Value Date   WBC 6.5 05/20/2021   HGB 15.1 05/20/2021   PLT 192 05/20/2021   NA 139 06/11/2021   K 4.3 06/11/2021   CL 102 06/11/2021   CO2 17 (L) 06/11/2021   GLUCOSE 75 06/11/2021   BUN 12 06/11/2021   CREATININE 1.09 06/11/2021   BILITOT 0.5 06/11/2021   ALKPHOS 80 06/11/2021   AST 24 06/11/2021   ALT 24 06/11/2021   PROT 7.7 06/11/2021   ALBUMIN 5.1 (H) 06/11/2021  CALCIUM 9.7 06/11/2021   GFRAA 112 06/08/2020    Speciality Comments: No specialty comments available.  Procedures:  No procedures performed Allergies: Pregabalin, Sulfa antibiotics, and Gabapentin   Assessment / Plan:     Visit Diagnoses: No diagnosis found.  ***  Orders: No orders of the defined types were placed in this encounter.  No orders of the defined types were placed in this encounter.    Follow-Up Instructions: No follow-ups on file.   Collier Salina, MD  Note - This record has been created using [employer].  Chart creation errors have been sought, but may not always  have been located. Such creation errors do not reflect on  the standard of medical care.

## 2021-07-06 ENCOUNTER — Ambulatory Visit: Payer: 59 | Admitting: Internal Medicine

## 2021-07-06 NOTE — Progress Notes (Deleted)
Office Visit Note  Patient: [name]             Date of Birth: [birth year]           MRN: [id number]             PCP: Gerlene Fee, DO Referring: Gerlene Fee, DO Visit Date: 07/07/2021   Subjective:  No chief complaint on file.   History of Present Illness: [name] is a 51 y.o. male here for follow up ***   Previous HPI    No Rheumatology ROS completed.   PMFS History:  Patient Active Problem List   Diagnosis Date Noted   Depressed mood 05/08/2021   Pain in both hands 05/08/2021   Pain of right thumb 04/12/2021   Other spondylosis with myelopathy, cervical region 02/02/2021   Low back pain 01/19/2021   Swelling of both hands 12/22/2020   Neck pain on left side 09/30/2020   Gait abnormality 09/30/2020   Left-sided weakness 09/01/2020   History of Lyme disease 09/01/2020   Abdominal pain, chronic, epigastric 08/18/2020   Unintentional weight loss 08/18/2020   Chronic constipation 08/18/2020   History of pancreatitis 08/18/2020   Family history of colon cancer 08/18/2020   Sinusitis 06/07/2020   Pancreatitis 05/21/2020   Cervical radiculopathy 05/14/2020   Rash and other nonspecific skin eruption 01/09/2020   Essential hypertension 01/03/2020   Vitamin D deficiency 11/26/2019   Hyperglycemia 11/26/2019   Tobacco use 10/11/2019   Hyperlipemia    Fatty liver 05/06/2019   S/P lumbar fusion 11/15/2017   Labral tear of shoulder, degenerative, left 02/21/2017   Elevated LFTs 01/09/2017   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    Past Medical History:  Diagnosis Date   Angio-edema    Anxiety    Disseminated Lyme disease    Dystonia    Foot drop, right 2000   Headache    Hypertension 2019   Lyme disease    Neck pain    Rocky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    Urticaria     Family History  Problem Relation Age of Onset   Heart disease Mother    Heart disease Father    Colon polyps Father    ALS Sister     Osteoarthritis Brother    Colon cancer Paternal Grandfather        dx at age 61   Healthy Son    Healthy Son    Esophageal cancer Neg Hx    Inflammatory bowel disease Neg Hx    Liver disease Neg Hx    Pancreatic cancer Neg Hx    Stomach cancer Neg Hx    Past Surgical History:  Procedure Laterality Date   ADENOIDECTOMY     ANTERIOR CERVICAL DECOMPRESSION/DISCECTOMY FUSION 4 LEVELS N/A 01/20/2021   Procedure: ANTERIOR CERVICAL DECOMPRESSION/DISCECTOMY FUSION 3  LEVELS C5-T1;  Surgeon: Melina Schools, MD;  Location: [place];  Service: Orthopedics;  Laterality: N/A;   BACK SURGERY     SACROILIAC JOINT FUSION Right 11/15/2017   Procedure: SACROILIAC JOINT FUSION;  Surgeon: Melina Schools, MD;  Location: [place];  Service: Orthopedics;  Laterality: Right;  120 mins   SACROILIAC JOINT FUSION Left 07/25/2018   Procedure: SACROILIAC JOINT FUSION;  Surgeon: Melina Schools, MD;  Location: [place];  Service: Orthopedics;  Laterality: Left;  SACROILIAC JOINT FUSION   TONSILLECTOMY     51 years old   Social History   Social History Narrative   Lives at home with  his wife.   Right-handed.   Caffeine use: two cans of [facility].   Immunization History  Administered Date(s) Administered   Influenza Split 01/09/2017   Influenza,inj,quad, With Preservative 01/09/2017   Tdap 11/16/2004     Objective: Vital Signs: There were no vitals taken for this visit.   Physical Exam   Musculoskeletal Exam: ***  CDAI Exam: CDAI Score: -- Patient Global: --; Provider Global: -- Swollen: --; Tender: -- Joint Exam 07/07/2021   No joint exam has been documented for this visit   There is currently no information documented on the homunculus. Go to the Rheumatology activity and complete the homunculus joint exam.  Investigation: No additional findings.  Imaging: No results found.  Recent Labs: Lab Results  Component Value Date   WBC 6.5 05/20/2021   HGB 15.1 05/20/2021   PLT 192 05/20/2021   NA  139 06/11/2021   K 4.3 06/11/2021   CL 102 06/11/2021   CO2 17 (L) 06/11/2021   GLUCOSE 75 06/11/2021   BUN 12 06/11/2021   CREATININE 1.09 06/11/2021   BILITOT 0.5 06/11/2021   ALKPHOS 80 06/11/2021   AST 24 06/11/2021   ALT 24 06/11/2021   PROT 7.7 06/11/2021   ALBUMIN 5.1 (H) 06/11/2021   CALCIUM 9.7 06/11/2021   GFRAA 112 06/08/2020    Speciality Comments: No specialty comments available.  Procedures:  No procedures performed Allergies: Pregabalin, Sulfa antibiotics, and Gabapentin   Assessment / Plan:     Visit Diagnoses: No diagnosis found.  ***  Orders: No orders of the defined types were placed in this encounter.  No orders of the defined types were placed in this encounter.    Follow-Up Instructions: No follow-ups on file.   Collier Salina, MD  Note - This record has been created using [employer].  Chart creation errors have been sought, but may not always  have been located. Such creation errors do not reflect on  the standard of medical care.

## 2021-07-07 ENCOUNTER — Ambulatory Visit: Payer: Self-pay | Admitting: Internal Medicine

## 2021-07-14 ENCOUNTER — Encounter: Payer: Self-pay | Admitting: Internal Medicine

## 2021-08-03 ENCOUNTER — Other Ambulatory Visit: Payer: Self-pay | Admitting: *Deleted

## 2021-08-03 DIAGNOSIS — M79642 Pain in left hand: Secondary | ICD-10-CM

## 2021-08-03 DIAGNOSIS — E7800 Pure hypercholesterolemia, unspecified: Secondary | ICD-10-CM

## 2021-08-04 ENCOUNTER — Other Ambulatory Visit: Payer: Self-pay

## 2021-08-04 DIAGNOSIS — I10 Essential (primary) hypertension: Secondary | ICD-10-CM

## 2021-08-04 MED ORDER — ROSUVASTATIN CALCIUM 20 MG PO TABS: ORAL_TABLET | ORAL | 3 refills | Status: DC

## 2021-08-04 MED ORDER — FLUTICASONE PROPIONATE 50 MCG/ACT NA SUSP: NASAL | 6 refills | Status: DC

## 2021-08-04 MED ORDER — GABAPENTIN 300 MG PO CAPS: 600.0000 mg | ORAL_CAPSULE | Freq: Two times a day (BID) | ORAL | 11 refills | Status: DC

## 2021-08-04 MED ORDER — VALSARTAN 40 MG PO TABS: 40.0000 mg | ORAL_TABLET | Freq: Every day | ORAL | 3 refills | Status: DC

## 2021-08-04 NOTE — Addendum Note (Signed)
Addended by: Alonna Buckler on: 08/04/2021 08:32 AM ? ? Modules accepted: Orders ? ?

## 2021-08-09 ENCOUNTER — Telehealth: Payer: Self-pay | Admitting: Cardiovascular Disease

## 2021-08-09 ENCOUNTER — Other Ambulatory Visit: Payer: Self-pay | Admitting: *Deleted

## 2021-08-09 DIAGNOSIS — I10 Essential (primary) hypertension: Secondary | ICD-10-CM

## 2021-08-09 MED ORDER — VALSARTAN 40 MG PO TABS: 40.0000 mg | ORAL_TABLET | Freq: Every day | ORAL | 2 refills | Status: DC

## 2021-08-09 NOTE — Telephone Encounter (Signed)
Refills has been sent to the pharmacy. 

## 2021-08-09 NOTE — Telephone Encounter (Signed)
?*  STAT* If patient is at the pharmacy, call can be transferred to refill team. ? ? ?1. Which medications need to be refilled? (please list name of each medication and dose if known)  ? ?valsartan (DIOVAN) 40 MG tablet ? ? ?2. Which pharmacy/location (including street and city if local pharmacy) is medication to be sent to? ?[id number] - [place], [place] - [address] AT [facility] OF [address] ? ?3. Do they need a 30 day or 90 day supply?  ?30 day supply ? ?

## 2021-08-10 ENCOUNTER — Other Ambulatory Visit: Payer: Self-pay | Admitting: *Deleted

## 2021-08-10 DIAGNOSIS — E7800 Pure hypercholesterolemia, unspecified: Secondary | ICD-10-CM

## 2021-08-10 DIAGNOSIS — M79641 Pain in right hand: Secondary | ICD-10-CM

## 2021-08-12 ENCOUNTER — Encounter: Payer: Self-pay | Admitting: Occupational Therapy

## 2021-08-12 NOTE — Therapy (Unsigned)
[place] [facility] ?[place], [place], [address] ?Phone: [phone]   Fax:  [phone] ? ?Patient Details  ?Name: [name] ?MRN: [id number] ?Date of Birth: Jan 27, 1971 ?Referring Provider:  No ref. provider found ? ?Encounter Date: 08/12/2021 ? ? ?OCCUPATIONAL THERAPY DISCHARGE SUMMARY ? ?Visits from Start of Care: 11 ? ?Current functional level related to goals / functional outcomes: ? OT Short Term Goals - 05/13/21 1414   ? ?  ? OT SHORT TERM GOAL #1  ? Title Independent with daytime and pm bilateral splints to improve positioning, function, and pain   ? Time 4   ? Period Weeks   ? Status Achieved   ?  ? OT SHORT TERM GOAL #2  ? Title Independent with initial HEP   ? Time 4   ? Period Weeks   ? Status Achieved   ?  ? OT SHORT TERM GOAL #3  ? Title Pt to verbalize understanding with A/E recommendations and task modifications to increase independence with ADLS (including cutting food, writing, dressing/buttons & belt, bathing, etc)   ? Time 4   ? Period Weeks   ? Status Partially met.  ? ?  ?  ? ?  ? ? ? OT Long Term Goals - 03/31/21 1454   ? ?  ? OT LONG TERM GOAL #1  ? Title Pt independent with updated HEP   ? Time 8   ? Period Weeks   ? Status Not fully met/unable to fully reassess due to pt not returning to OT  ?  ? OT LONG TERM GOAL #2  ? Title Pt to improve BUE hand function as evidenced by increasing Box & Blocks score by 6   ? Baseline Rt = 17, Lt = 16   ? Time 8   ? Period Weeks   ? Status Not fully met/unable to fully reassess due to pt not returning to OT  ?  ? OT LONG TERM GOAL #3  ? Title Pt overall mod assist or less for BADLS using A/E prn   ? Time 8   ? Period Weeks   ? Status Not fully met/unable to fully reassess due to pt not returning to OT  ?  ? OT LONG TERM GOAL #4  ? Title Pt to report pain 5/10 or under bilateral thumbs   ? Baseline 8/10   ? Time 8   ? Period Weeks   ? Status Not fully met/unable to fully reassess  due to pt not returning to OT  ?  ? OT LONG TERM GOAL #5  ? Title Pt to demo 75% composite extension Lt hand in prep for functional tasks/grasping   ? Time 8   ? Period Weeks   ? Status Not fully met/unable to fully reassess due to pt not returning to OT  ? ?  ?  ? ?  ? ? ? ?  ?Remaining deficits: ?Decr strength, decr ROM, decr coordination, pain ?  ?Education / Equipment: ?Pt educated in strategies/AE to assist with ADLs/IADLs,  HEP.  However, education not fully completed due to pt not returning to OT. ? ?Patient agrees to discharge. Patient goals were partially met. Patient is being discharged due to a change in medical status. (Received message that pt had Covid, and pt did not return). ? ? ? ? Vianne Bulls, [place] ?08/12/2021, 12:56 PM ? ?[name] [facility] ?[place], [place], [address] ?Phone: [phone]  Fax:  [phone] ? ?Vianne Bulls, OTR/L ?[place] ?[place], [place]  [address] ?[phone] phone ?[phone] ?08/12/21 12:56 PM ? ? ?

## 2021-08-13 ENCOUNTER — Telehealth: Payer: Self-pay | Admitting: Pharmacist

## 2021-08-13 NOTE — Telephone Encounter (Signed)
[facility] pharmacy called, reported that patient has requested a 90 day supply of valsartan plus 3 refills. Looked and saw last [facility] sent to [facility] was for a 30 day supply and said patient must keep upcoming appointment. Denied request from [facility]. ?

## 2021-09-09 ENCOUNTER — Encounter: Payer: Self-pay | Admitting: Family Medicine

## 2021-09-10 DIAGNOSIS — M7711 Lateral epicondylitis, right elbow: Secondary | ICD-10-CM | POA: Insufficient documentation

## 2021-09-13 ENCOUNTER — Ambulatory Visit (INDEPENDENT_AMBULATORY_CARE_PROVIDER_SITE_OTHER): Payer: Managed Care, Other (non HMO) | Admitting: Family Medicine

## 2021-09-13 VITALS — BP 126/70 | Wt 242.0 lb

## 2021-09-13 DIAGNOSIS — L237 Allergic contact dermatitis due to plants, except food: Secondary | ICD-10-CM

## 2021-09-13 DIAGNOSIS — L255 Unspecified contact dermatitis due to plants, except food: Secondary | ICD-10-CM | POA: Diagnosis not present

## 2021-09-13 MED ORDER — HYDROCORTISONE 2.5 % EX OINT: TOPICAL_OINTMENT | Freq: Two times a day (BID) | CUTANEOUS | 1 refills | Status: DC

## 2021-09-13 MED ORDER — TRIAMCINOLONE ACETONIDE 0.1 % EX OINT: 1.0000 "application " | TOPICAL_OINTMENT | Freq: Two times a day (BID) | CUTANEOUS | 1 refills | Status: DC

## 2021-09-13 MED ORDER — FEXOFENADINE HCL 180 MG PO TABS: 180.0000 mg | ORAL_TABLET | Freq: Every day | ORAL | 3 refills | Status: DC

## 2021-09-13 NOTE — Patient Instructions (Addendum)
It was a pleasure to see you today! ? ?For poison sumac on FACE: use hydrocortisone ointment twice a day on the itchy, red spots until clear or up to 2 weeks (whichever is shorter). It is important that you do not use the steroid cream for longer without break as there are risks of hypopigmentation (skin lightening) and skin thinning. ?For poison sumac on BODY: use triamcinolone ointment twice a day on itchy, red spots until clear or up to 2 weeks (whichever shorter).  ?Also start allegra once daily for allergies and this will help with itching and swelling form poison sumac ?If you have worsening of your rash, eye swelling, fever, you are incredibly uncomfortable despite ointment and allegra, please call us back and let us know as you will likely need oral steroids if that occurs ?Follow up as needed ? ? ?Be Well, ? ?Dr. Leary Roca ? ?

## 2021-09-13 NOTE — Progress Notes (Signed)
? ? ?  SUBJECTIVE:  ? ?CHIEF COMPLAINT / HPI: rash ? ?Contact dermatitis: patient was working in his yard this weekend and came into contact with poison sumac (confirmed with picture). He first had itchy, red vesicles start on his hands, then they were present on his face. He has had reactions to poison sumac and ivy before. He has been using calomine lotion with some mild relief. No fevers, no pustulant drainage. ? ?PERTINENT  PMH / PSH: non-contributory ? ?OBJECTIVE:  ? ?BP 126/70   Wt 242 lb (109.8 kg)   BMI 32.82 kg/m?   ?Nursing note and vitals reviewed ?GEN: age-appropriate, WM resting comfortably in chair, NAD, class I obesity ?HEENT: NCAT. PERRLA. Sclera without injection or icterus. MMM.  ?Neck: Supple. No LAD. ?Skin: linear crop of erythema with vesicles on b/l hands, erythematous patch on right ear lobe, right eye brow, left medial nose ?Cardiac: Regular rate and rhythm. Normal S1/S2. No murmurs, rubs, or gallops appreciated. 2+ radial pulses. ?Neuro: AOx3  ?Ext: no edema ?Psych: Pleasant and appropriate  ? ? ? ?ASSESSMENT/PLAN:  ? ?Contact dermatitis due to plant ?Contact dermatitis due to poison sumac. Patient with expected rash. Will treat face with hydrocortisone 2.5% oint, treat body with triamcinolone 1% oint, also refill allegra. Patient reasonably comfortable, swelling limited. If worsens despite treatment, would likely need oral steroid. Today is day 3 of symptoms, hopefully will not worsen. Discussed symptom tx and return precautions (fever >100.4*F, pustulant drainage, spreading erythema/swelling, unable to tolerate discomfort despite tx). ?  ? ? ?Shirlean Mylar, MD ?[facility]  ? ?

## 2021-09-13 NOTE — Assessment & Plan Note (Signed)
Contact dermatitis due to poison sumac. Patient with expected rash. Will treat face with hydrocortisone 2.5% oint, treat body with triamcinolone 1% oint, also refill allegra. Patient reasonably comfortable, swelling limited. If worsens despite treatment, would likely need oral steroid. Today is day 3 of symptoms, hopefully will not worsen. Discussed symptom tx and return precautions (fever >100.4*F, pustulant drainage, spreading erythema/swelling, unable to tolerate discomfort despite tx). ?

## 2021-09-16 ENCOUNTER — Encounter: Payer: Self-pay | Admitting: Family Medicine

## 2021-09-17 ENCOUNTER — Ambulatory Visit (HOSPITAL_COMMUNITY)
Admission: EM | Admit: 2021-09-17 | Discharge: 2021-09-17 | Disposition: A | Discharge status: Home / Self Care | Payer: Managed Care, Other (non HMO) | Attending: Nurse Practitioner | Admitting: Nurse Practitioner

## 2021-09-17 ENCOUNTER — Encounter: Payer: Self-pay | Admitting: Family Medicine

## 2021-09-17 ENCOUNTER — Other Ambulatory Visit: Payer: Self-pay | Admitting: Family Medicine

## 2021-09-17 DIAGNOSIS — L237 Allergic contact dermatitis due to plants, except food: Secondary | ICD-10-CM | POA: Diagnosis not present

## 2021-09-17 MED ORDER — METHYLPREDNISOLONE SODIUM SUCC 125 MG IJ SOLR
80.0000 mg | Freq: Once | INTRAMUSCULAR | Status: AC
  Administered 2021-09-17: 80 mg via INTRAMUSCULAR

## 2021-09-17 MED ORDER — DIPHENHYDRAMINE HCL 25 MG PO TABS: 25.0000 mg | ORAL_TABLET | Freq: Four times a day (QID) | ORAL | 0 refills | Status: DC | PRN

## 2021-09-17 MED ORDER — METHYLPREDNISOLONE SODIUM SUCC 125 MG IJ SOLR
INTRAMUSCULAR | Status: AC
  Filled 2021-09-17: qty 2

## 2021-09-17 MED ORDER — PREDNISONE 50 MG PO TABS: 50.0000 mg | ORAL_TABLET | Freq: Every day | ORAL | 0 refills | Status: DC

## 2021-09-17 NOTE — ED Triage Notes (Signed)
C/o rash all over body x 8 days. He was seen by PCP and given a cream but this has not help. ?

## 2021-09-17 NOTE — Discharge Instructions (Addendum)
-   We have given you a shot of Solu-Medrol (steroid) to help with the rash ?- Please pick up the Benadryl and prednisone that was prescribed by your PCP and start on these medications. Start the prednisone tomorrow. ?

## 2021-09-17 NOTE — Progress Notes (Signed)
Patient with contact dermatitis due to poison sumac, not improving despe mid-potency topical steroids, H2 blocker, and antihistamine. Will treat with H1 blocker and short course of oral steroid. ? ?Gladys Damme, MD ?[facility], PGY-3 ? ?

## 2021-09-17 NOTE — ED Provider Notes (Signed)
?[facility] ? ? ? ?CSN: [id number] ?Arrival date & time: 09/17/21  1725 ? ? ?  ? ?History   ?Chief Complaint ?Chief Complaint  ?Patient presents with  ? Rash  ? ? ?HPI ?[name] is a 51 y.o. male.  ? ?Patient presents for 8 days of full body rash that red and itchy and spreading.  He reports he thinks he was exposed to poison sumac.  He denies any throat or tongue swelling.  Denies shortness of breath or wheezing.  No fevers. ? ?He saw his primary care provider a few days ago-they prescribed topical ointments, daily antihistamine, and H2 blocker. ? ? ?Past Medical History:  ?Diagnosis Date  ? Angio-edema   ? Anxiety   ? Disseminated Lyme disease   ? Dystonia   ? Foot drop, right 2000  ? Headache   ? Hypertension 2019  ? Lyme disease   ? Neck pain   ? [facility] spotted fever   ? Urticaria   ? ? ?Patient Active Problem List  ? Diagnosis Date Noted  ? Contact dermatitis due to plant 09/13/2021  ? Depressed mood 05/08/2021  ? Pain in both hands 05/08/2021  ? Pain of right thumb 04/12/2021  ? Other spondylosis with myelopathy, cervical region 02/02/2021  ? Low back pain 01/19/2021  ? Swelling of both hands 12/22/2020  ? Neck pain on left side 09/30/2020  ? Gait abnormality 09/30/2020  ? Left-sided weakness 09/01/2020  ? History of Lyme disease 09/01/2020  ? Abdominal pain, chronic, epigastric 08/18/2020  ? Unintentional weight loss 08/18/2020  ? Chronic constipation 08/18/2020  ? History of pancreatitis 08/18/2020  ? Family history of colon cancer 08/18/2020  ? Sinusitis 06/07/2020  ? Pancreatitis 05/21/2020  ? Cervical radiculopathy 05/14/2020  ? Rash and other nonspecific skin eruption 01/09/2020  ? Essential hypertension 01/03/2020  ? Vitamin D deficiency 11/26/2019  ? Hyperglycemia 11/26/2019  ? Tobacco use 10/11/2019  ? Hyperlipemia   ? Fatty liver 05/06/2019  ? S/P lumbar fusion 11/15/2017  ? Labral tear of shoulder, degenerative, left 02/21/2017  ? Elevated LFTs 01/09/2017  ? 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 ? ?Past Surgical History:  ?Procedure Laterality Date  ? ADENOIDECTOMY    ? ANTERIOR CERVICAL DECOMPRESSION/DISCECTOMY FUSION 4 LEVELS N/A 01/20/2021  ? Procedure: ANTERIOR CERVICAL DECOMPRESSION/DISCECTOMY FUSION 3  LEVELS C5-T1;  Surgeon: Venita Lick, MD;  Location: [facility];  Service: Orthopedics;  Laterality: N/A;  ? BACK SURGERY    ? SACROILIAC JOINT FUSION Right 11/15/2017  ? Procedure: SACROILIAC JOINT FUSION;  Surgeon: Venita Lick, MD;  Location: [facility] OR;  Service: Orthopedics;  Laterality: Right;  120 mins  ? SACROILIAC JOINT FUSION Left 07/25/2018  ? Procedure: SACROILIAC JOINT FUSION;  Surgeon: Venita Lick, MD;  Location: [facility] OR;  Service: Orthopedics;  Laterality: Left;  SACROILIAC JOINT FUSION  ? TONSILLECTOMY    ? 51 years old  ? ? ? ? ? ?Home Medications   ? ?Prior to Admission medications   ?Medication Sig Start Date End Date Taking? Authorizing Provider  ?diphenhydrAMINE (BENADRYL ALLERGY) 25 MG tablet Take 1 tablet (25 mg total) by mouth every 6 (six) hours as needed for itching. 09/17/21   Shirlean Mylar, MD  ?famotidine (PEPCID) 20 MG tablet Take 1 tablet (20 mg total) by mouth as needed. 05/11/21   Autry-Lott, Randa Evens, DO  ?fexofenadine (ALLEGRA ALLERGY) 180 MG tablet Take 1 tablet (180 mg total) by mouth daily. 09/13/21   Shirlean Mylar, MD  ?  fluticasone (FLONASE) 50 MCG/ACT nasal spray SHAKE LIQUID AND USE 2 SPRAYS IN EACH NOSTRIL DAILY Strength: 50 MCG/ACT 08/04/21   Autry-Lott, Randa EvensSimone, DO  ?gabapentin (NEURONTIN) 300 MG capsule Take 2 capsules (600 mg total) by mouth 2 (two) times daily. 08/04/21   Autry-Lott, Randa EvensSimone, DO  ?hydrocortisone 2.5 % ointment Apply topically 2 (two) times daily. For poison sumac, use twice a day on face. 09/13/21   Shirlean MylarMahoney, Caitlin, MD  ?montelukast (SINGULAIR) 10 MG tablet Take 10 mg by mouth daily. 01/10/21   [provider]  ?ondansetron (ZOFRAN) 4 MG tablet Take 1 tablet (4 mg total) by mouth every 8 (eight) hours as  needed for nausea or vomiting. 01/20/21   Venita LickBrooks, Dahari, MD  ?predniSONE (DELTASONE) 50 MG tablet Take 1 tablet (50 mg total) by mouth daily with breakfast. 09/17/21   Shirlean MylarMahoney, Caitlin, MD  ?pseudoephedrine (SUDAFED) 120 MG 12 hr tablet Take 120 mg by mouth daily.    [provider]  ?rosuvastatin (CRESTOR) 20 MG tablet TAKE 1 TABLET(20 MG) BY MOUTH DAILY Strength: 20 mg 08/04/21   Autry-Lott, Randa EvensSimone, DO  ?triamcinolone ointment (KENALOG) 0.1 % Apply 1 application. topically 2 (two) times daily. 09/13/21   Shirlean MylarMahoney, Caitlin, MD  ?valsartan (DIOVAN) 40 MG tablet Take 1 tablet (40 mg total) by mouth daily. KEEP OV. 08/09/21   Runell GessBerry, Jonathan J, MD  ? ? ?Family History ?Family History  ?Problem Relation Age of Onset  ? Heart disease Mother   ? Heart disease Father   ? Colon polyps Father   ? ALS Sister   ? Osteoarthritis Brother   ? Colon cancer Paternal Grandfather   ?     dx at age 50  ? Healthy Son   ? Healthy Son   ? Esophageal cancer Neg Hx   ? Inflammatory bowel disease Neg Hx   ? Liver disease Neg Hx   ? Pancreatic cancer Neg Hx   ? Stomach cancer Neg Hx   ? ? ?Social History ?Social History  ? ?Tobacco Use  ? Smoking status: Every Day  ?  Packs/day: 0.50  ?  Years: 30.00  ?  Pack years: 15.00  ?  Types: Cigarettes  ? Smokeless tobacco: Never  ?Vaping Use  ? Vaping Use: Never used  ?Substance Use Topics  ? Alcohol use: Not Currently  ?  Comment: stopped 2018  ? Drug use: No  ? ? ? ?Allergies   ?Pregabalin, Sulfa antibiotics, and Gabapentin ? ? ?Review of Systems ?Review of Systems ?Per HPI ? ?Physical Exam ?Triage Vital Signs ?ED Triage Vitals [09/17/21 1756]  ?Enc Vitals Group  ?   BP 136/85  ?   Pulse Rate 87  ?   Resp 18  ?   Temp 98.3 ?F (36.8 ?C)  ?   Temp Source Oral  ?   SpO2 100 %  ?   Weight   ?   Height   ?   Head Circumference   ?   Peak Flow   ?   Pain Score   ?   Pain Loc   ?   Pain Edu?   ?   Excl. in GC?   ? ?No data found. ? ?Updated Vital Signs ?BP 136/85 (BP Location: Left Arm)   Pulse 87    Temp 98.3 ?F (36.8 ?C) (Oral)   Resp 18   SpO2 100%  ? ?Visual Acuity ?Right Eye Distance:   ?Left Eye Distance:   ?Bilateral Distance:   ? ?Right  Eye Near:   ?Left Eye Near:    ?Bilateral Near:    ? ?Physical Exam ?Vitals and nursing note reviewed.  ?Constitutional:   ?   General: He is not in acute distress. ?   Appearance: Normal appearance. He is not toxic-appearing.  ?HENT:  ?   Mouth/Throat:  ?   Mouth: Mucous membranes are moist.  ?   Pharynx: Oropharynx is clear.  ?Pulmonary:  ?   Effort: Pulmonary effort is normal. No respiratory distress.  ?Skin: ?   General: Skin is warm.  ?   Capillary Refill: Capillary refill takes less than 2 seconds.  ?   Findings: Erythema and rash present. Rash is papular and urticarial.  ?   Comments: Widespread, papular, erythematous, urticarial rash to face, ears, abdomen, right hand, right leg  ?Neurological:  ?   Mental Status: He is alert and oriented to person, place, and time.  ?   Motor: No weakness.  ?   Gait: Gait normal.  ?Psychiatric:     ?   Behavior: Behavior is cooperative.  ? ? ? ?UC Treatments / Results  ?Labs ?(all labs ordered are listed, but only abnormal results are displayed) ?Labs Reviewed - No data to display ? ?EKG ? ? ?Radiology ?No results found. ? ?Procedures ?Procedures (including critical care time) ? ?Medications Ordered in UC ?Medications  ?methylPREDNISolone sodium succinate (SOLU-MEDROL) 125 mg/2 mL injection 80 mg (80 mg Intramuscular Given 09/17/21 1826)  ? ? ?Initial Impression / Assessment and Plan / UC Course  ?I have reviewed the triage vital signs and the nursing notes. ? ?Pertinent labs & imaging results that were available during my care of the patient were reviewed by me and considered in my medical decision making (see chart for details). ? ?  ?Treat contact dermatitis with Solu-Medrol 80 mg IM in urgent care today.  After review of chart, it looks like the patient reached out to his primary care office and they ordered a burst of  steroid for him today.  Encouraged him to pick this up and start tomorrow morning.  Continue daily oral antihistamine and start Benadryl at night time to help with itch.   ?Final Clinical Impressions(s) / UC D

## 2021-10-13 ENCOUNTER — Ambulatory Visit (INDEPENDENT_AMBULATORY_CARE_PROVIDER_SITE_OTHER): Payer: Commercial Managed Care - HMO | Admitting: Family Medicine

## 2021-10-13 ENCOUNTER — Ambulatory Visit (INDEPENDENT_AMBULATORY_CARE_PROVIDER_SITE_OTHER): Payer: Commercial Managed Care - HMO | Admitting: Cardiovascular Disease

## 2021-10-13 ENCOUNTER — Encounter: Payer: Self-pay | Admitting: Family Medicine

## 2021-10-13 ENCOUNTER — Encounter: Payer: Self-pay | Admitting: Cardiovascular Disease

## 2021-10-13 VITALS — BP 118/70 | HR 79 | Ht 72.0 in | Wt 239.0 lb

## 2021-10-13 DIAGNOSIS — M25561 Pain in right knee: Secondary | ICD-10-CM

## 2021-10-13 DIAGNOSIS — R6889 Other general symptoms and signs: Secondary | ICD-10-CM

## 2021-10-13 DIAGNOSIS — Z8619 Personal history of other infectious and parasitic diseases: Secondary | ICD-10-CM | POA: Diagnosis not present

## 2021-10-13 DIAGNOSIS — E782 Mixed hyperlipidemia: Secondary | ICD-10-CM | POA: Diagnosis not present

## 2021-10-13 DIAGNOSIS — R4589 Other symptoms and signs involving emotional state: Secondary | ICD-10-CM | POA: Diagnosis not present

## 2021-10-13 DIAGNOSIS — M25562 Pain in left knee: Secondary | ICD-10-CM

## 2021-10-13 DIAGNOSIS — I10 Essential (primary) hypertension: Secondary | ICD-10-CM | POA: Diagnosis not present

## 2021-10-13 MED ORDER — VALSARTAN 40 MG PO TABS: 40.0000 mg | ORAL_TABLET | Freq: Every day | ORAL | 3 refills | Status: DC

## 2021-10-13 MED ORDER — ACETAMINOPHEN 325 MG PO CAPS: 1.0000 | ORAL_CAPSULE | ORAL | 0 refills | Status: DC | PRN

## 2021-10-13 NOTE — Assessment & Plan Note (Signed)
History of essential hypertension blood pressure measured today at 118/70.  He is on valsartan. ?

## 2021-10-13 NOTE — Patient Instructions (Signed)
It was wonderful to see you today. ? ?Please bring ALL of your medications with you to every visit.  ? ?Today we talked about: ? ?Joint pain- I have prescribed tylenol to take with your oxycodone.  ?I will call you when labs are available if abnormal if normal I will communicate via MyChart. ?I will encourage you to follow-up with Ortho. ? ?Please be sure to schedule follow up at the front  desk before you leave today. ? ?Please call the clinic at [phone] if your symptoms worsen or you have any concerns. It was our pleasure to serve you. ? ?Dr. Salvadore Dom ? ?

## 2021-10-13 NOTE — Assessment & Plan Note (Signed)
History of hyperlipidemia on statin therapy.  We will recheck a lipid liver profile 

## 2021-10-13 NOTE — Patient Instructions (Signed)
Medication Instructions:  ?Your physician recommends that you continue on your current medications as directed. Please refer to the Current Medication list given to you today. ? ?*If you need a refill on your cardiac medications before your next appointment, please call your pharmacy* ? ? ?Lab Work: ?Your physician recommends that you return for lab work in: the next week or 2 for FASTING lipid/liver profile. ? ?If you have labs (blood work) drawn today and your tests are completely normal, you will receive your results only by: ?MyChart Message (if you have MyChart) OR ?A paper copy in the mail ?If you have any lab test that is abnormal or we need to change your treatment, we will call you to review the results. ? ? ?Follow-Up: ?At [facility], you and your health needs are our priority.  As part of our continuing mission to provide you with exceptional heart care, we have created designated Provider Care Teams.  These Care Teams include your primary Cardiologist (physician) and Advanced Practice Providers (APPs -  Physician Assistants and Nurse Practitioners) who all work together to provide you with the care you need, when you need it. ? ?We recommend signing up for the patient portal called "MyChart".  Sign up information is provided on this After Visit Summary.  MyChart is used to connect with patients for Virtual Visits (Telemedicine).  Patients are able to view lab/test results, encounter notes, upcoming appointments, etc.  Non-urgent messages can be sent to your provider as well.   ?To learn more about what you can do with MyChart, go to [email or web address].   ? ?Your next appointment:   ?We will see you on an as needed basis. ? ?Provider:   ?Nanetta Batty, MD ?

## 2021-10-13 NOTE — Progress Notes (Signed)
? ? ? ?10/13/2021 ?PETE SCHNITZER Chen   ?Nov 14, 1970  ?[id number] ? ?Primary Physician Autry-Lott, Randa Evens, DO ?Primary Cardiologist: Runell Gess MD Nicholes Calamity, [place] ? ?HPI:  [name] is a 50 y.o.  moderately overweight married Caucasian male father of 4 children, grandfather 1 grandchild who has not worked since 2018 after he contracted [employer] spotted fever and Lyme disease with multiple sequela after that.  He was referred by Karsten Fells, DO for cardiovascular valuation because of excessive fatigue and atypical chest pain.  I last saw him in the office 02/10/2020.  His risk factors include 15-pack-year tobacco abuse currently smoking 1/2 pack/day.  He has family history of heart disease with both mother and father who have CAD.  He has treated hyperlipidemia and untreated hypertension.  He apparently was bitten by a tick back in 2018 and had [facility] spotted fever fever and Lyme disease with significant sequela.  He gets chest pain several times a day and some dyspnea as well. ? ?Since I saw him 2 years ago I did subsequently get an echo which was normal and a coronary calcium score which was 0.  His major complaints have been fatigue.  He denies chest pain.  He has however had sequela of Lyme disease requiring multiple orthopedic procedures including bilateral hip replacements, and neck fusion. ? ? ?Current Meds  ?Medication Sig  ? fexofenadine (ALLEGRA ALLERGY) 180 MG tablet Take 1 tablet (180 mg total) by mouth daily.  ? fluticasone (FLONASE) 50 MCG/ACT nasal spray SHAKE LIQUID AND USE 2 SPRAYS IN EACH NOSTRIL DAILY Strength: 50 MCG/ACT  ? gabapentin (NEURONTIN) 300 MG capsule Take 2 capsules (600 mg total) by mouth 2 (two) times daily.  ? rosuvastatin (CRESTOR) 20 MG tablet TAKE 1 TABLET(20 MG) BY MOUTH DAILY Strength: 20 mg  ? valsartan (DIOVAN) 40 MG tablet Take 1 tablet (40 mg total) by mouth daily. KEEP OV.  ?  ? ?Allergies  ?Allergen Reactions  ? Pregabalin Hives, Shortness  Of Breath and Other (See Comments)  ?  **ATTEMPTING TO TAKE THIS MEDICATION AGAIN** ?Insomnia ?Mood alter  ? Sulfa Antibiotics Anaphylaxis  ? Gabapentin Hives, Nausea And Vomiting and Nausea Only  ?  Sweating, Fever, Hives ?Other reaction(s): Abdominal pain, Fever, Insomnia  ? ? ?Social History  ? ?Socioeconomic History  ? Marital status: Married  ?  Spouse name: Not on file  ? Number of children: 2  ? Years of education: college  ? Highest education level: Bachelor's degree (e.g., BA, AB, BS)  ?Occupational History  ? Occupation: currently seeking disability  ?Tobacco Use  ? Smoking status: Every Day  ?  Packs/day: 0.50  ?  Years: 30.00  ?  Pack years: 15.00  ?  Types: Cigarettes  ? Smokeless tobacco: Never  ?Vaping Use  ? Vaping Use: Never used  ?Substance and Sexual Activity  ? Alcohol use: Not Currently  ?  Comment: stopped 2018  ? Drug use: No  ? Sexual activity: Yes  ?Other Topics Concern  ? Not on file  ?Social History Narrative  ? Lives at home with his wife.  ? Right-handed.  ? Caffeine use: two cans of [facility].  ? ?Social Determinants of Health  ? ?Financial Resource Strain: Not on file  ?Food Insecurity: Not on file  ?Transportation Needs: Not on file  ?Physical Activity: Not on file  ?Stress: Not on file  ?Social Connections: Not on file  ?Intimate Partner Violence: Not on file  ?  ? ?  Review of Systems: ?General: negative for chills, fever, night sweats or weight changes.  ?Cardiovascular: negative for chest pain, dyspnea on exertion, edema, orthopnea, palpitations, paroxysmal nocturnal dyspnea or shortness of breath ?Dermatological: negative for rash ?Respiratory: negative for cough or wheezing ?Urologic: negative for hematuria ?Abdominal: negative for nausea, vomiting, diarrhea, bright red blood per rectum, melena, or hematemesis ?Neurologic: negative for visual changes, syncope, or dizziness ?All other systems reviewed and are otherwise negative except as noted above. ? ? ? ?Blood pressure  118/70, pulse 85, height 6' (1.829 m), weight 237 lb 6.4 oz (107.7 kg), SpO2 96 %.  ?General appearance: alert and no distress ?Neck: no adenopathy, no carotid bruit, no JVD, supple, symmetrical, trachea midline, and thyroid not enlarged, symmetric, no tenderness/mass/nodules ?Lungs: clear to auscultation bilaterally ?Heart: regular rate and rhythm, S1, S2 normal, no murmur, click, rub or gallop ?Extremities: extremities normal, atraumatic, no cyanosis or edema ?Pulses: 2+ and symmetric ?Skin: Skin color, texture, turgor normal. No rashes or lesions ?Neurologic: Grossly normal ? ?EKG sinus rhythm at 85 without ST or T wave changes.  Personally reviewed this EKG. ? ?ASSESSMENT AND PLAN:  ? ?Hyperlipemia ?History of hyperlipidemia on statin therapy.  We will recheck a lipid liver profile. ? ?Essential hypertension ?History of essential hypertension blood pressure measured today at 118/70.  He is on valsartan. ? ? ? ? ?Runell Gess MD FACP,FACC,FAHA, FSCAI ?10/13/2021 ?2:34 PM ?

## 2021-10-13 NOTE — Progress Notes (Signed)
SUBJECTIVE:   CHIEF COMPLAINT / HPI:   Multiple joint pain Continues to experience pain in several joints.  Currently under the care of Ortho for elbow and hand pain.  He does see OT regularly.  He has new pain in both knees that was not present before.  Denies injury but does endorse fall onto the left knee.  The ongoing joint pain is affecting his mood.  He is endorsing a depressed state.  He has not followed up on therapy resources from our last visit but plans to do so soon.  He states he has seen a rheumatologist in the past as well as a neurologist in the past and desires answers at this time.  Denies fever, weight loss, night sweats.  He denies that his oxycodone has helped with the knee pain.  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  Constitutional:      General: He is not in acute distress.    Appearance: He is not ill-appearing, toxic-appearing or diaphoretic.  Pulmonary:     Effort: Pulmonary effort is normal.  Musculoskeletal:     Comments: Bilateral Knee: - Inspection: no gross deformity b/l. No swelling/effusion, erythema or bruising b/l. Skin intact - Palpation: mildly TTP at joint spaces b/l - ROM: full active ROM with flexion and extension in knee and hip b/l - Strength: 4/5 strength b/l - Neuro/vasc: NV intact distally b/l - Special Tests: - LIGAMENTS: negative anterior and posterior drawer, no MCL or LCL laxity  -- MENISCUS: negative McMurray's -- PF JOINT: nml patellar mobility bilaterally   Neurological:     Mental Status: He is alert.  Psychiatric:        Mood and Affect: Mood is depressed. Affect is flat and tearful.        Behavior: Behavior is slowed.         Cognition and Memory: Cognition normal.        Judgment: Judgment normal.    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  Afebrile without other red flags.  History of multiple joint pain with prior history of Lyme disease and suspected erythema chronicum that appears was worked up in a Financial planner fashion.  Reviewed notes from neurology, rheumatology, Ortho.  Today with bilateral knee pain and tenderness at joint space with a history of recent fall on the left side.  Knee exam is otherwise unremarkable.  No recent knee plain films we will get a standing knee x-ray to assess for degree of arthritis.  We will also obtain some serologies to consider rheumatoid arthritis.  Follow-up to be determined following results of tests and images.  Tylenol given as in addition to current oxycodone dose. - ANA - C-reactive protein - Rheumatoid factor - DG Knee Bilateral Standing AP; Future  Depressed mood Elevated PHQ-9 and endorsing depressed mood.  Patient believes his depression contributed to chronic joint pain.  Discussed previously we will attempt to help control pain and see if mood improves otherwise highly consider medication along with seeking therapy.  Patient plans to  visit [email or web address] website to look for counseling.  Lavonda Jumbo, DO [facility]

## 2021-10-14 ENCOUNTER — Ambulatory Visit (HOSPITAL_COMMUNITY)
Admission: RE | Admit: 2021-10-14 | Discharge: 2021-10-14 | Disposition: A | Discharge status: Home / Self Care | Payer: Commercial Managed Care - HMO | Source: Ambulatory Visit | Attending: Family Medicine | Admitting: Family Medicine

## 2021-10-14 ENCOUNTER — Encounter: Payer: Self-pay | Admitting: Family Medicine

## 2021-10-14 DIAGNOSIS — M25561 Pain in right knee: Secondary | ICD-10-CM | POA: Diagnosis present

## 2021-10-14 DIAGNOSIS — M25562 Pain in left knee: Secondary | ICD-10-CM | POA: Diagnosis present

## 2021-10-14 LAB — ANA: Anti Nuclear Antibody (ANA): NEGATIVE

## 2021-10-14 LAB — RHEUMATOID FACTOR: Rheumatoid fact SerPl-aCnc: 11.1 IU/mL (ref <14.0)

## 2021-10-14 LAB — C-REACTIVE PROTEIN: CRP: 3 mg/L (ref 0–10)

## 2021-10-20 LAB — LIPID PANEL
Chol/HDL Ratio: 3.6 ratio (ref 0.0–5.0)
Cholesterol, Total: 113 mg/dL (ref 100–199)
HDL: 31 mg/dL — ABNORMAL LOW (ref >39)
LDL Chol Calc (NIH): 52 mg/dL (ref 0–99)
Triglycerides: 183 mg/dL — ABNORMAL HIGH (ref 0–149)
VLDL Cholesterol Cal: 30 mg/dL (ref 5–40)

## 2021-10-20 LAB — HEPATIC FUNCTION PANEL
ALT: 17 IU/L (ref 0–44)
AST: 21 IU/L (ref 0–40)
Albumin: 4.6 g/dL (ref 4.0–5.0)
Alkaline Phosphatase: 69 IU/L (ref 44–121)
Bilirubin Total: 0.3 mg/dL (ref 0.0–1.2)
Bilirubin, Direct: 0.12 mg/dL (ref 0.00–0.40)
Total Protein: 6.8 g/dL (ref 6.0–8.5)

## 2021-10-22 ENCOUNTER — Encounter: Payer: Self-pay | Admitting: Cardiovascular Disease

## 2021-10-22 ENCOUNTER — Encounter: Payer: Self-pay | Admitting: Family Medicine

## 2021-10-28 ENCOUNTER — Encounter: Payer: Self-pay | Admitting: Family Medicine

## 2021-10-28 DIAGNOSIS — M79641 Pain in right hand: Secondary | ICD-10-CM

## 2021-10-28 DIAGNOSIS — M7989 Other specified soft tissue disorders: Secondary | ICD-10-CM

## 2021-11-02 ENCOUNTER — Encounter: Payer: Self-pay | Admitting: *Deleted

## 2021-11-27 ENCOUNTER — Encounter: Payer: Self-pay | Admitting: Family Medicine

## 2021-12-11 ENCOUNTER — Other Ambulatory Visit: Payer: Self-pay | Admitting: Family Medicine

## 2021-12-11 DIAGNOSIS — E7800 Pure hypercholesterolemia, unspecified: Secondary | ICD-10-CM

## 2021-12-13 ENCOUNTER — Encounter: Payer: Self-pay | Admitting: Student

## 2021-12-17 NOTE — Progress Notes (Signed)
  SUBJECTIVE:   CHIEF COMPLAINT / HPI:   New concerns:  - Hand surgery in future: issue for 3-5 years  - Bouts of extreme fatigue Happens where the roof of moth and one hand goes numb, double vision, slowed mentation/memory.  - Rashes on skin in groin and other places: located on left elbow, left thigh, and left hip. This has been happening over a year. And he continues to lance and clean with alcohol.  - Stress from seperation with wife, she had hx of ETOH abuse. Splitting up initiated by patient, and what he believes was in his best interest.   - Pain management handeling back pain, chronic.   - Not dreaming, is sleeping 5-6 hours, but not dreaming  - Mild depression  PERTINENT  PMH / PSH: HTN, HLD    OBJECTIVE:  BP (!) 135/97   Pulse 80   Ht 6' (1.829 m)   Wt 106.6 kg   BMI 31.87 kg/m   General: NAD, pleasant, able to participate in exam Skin: warm and dry, multiple lesions located on left thigh, hip, and elbow. Lesions look like healed cyst, with no swelling or tenderness, pink base Neuro: alert, no obvious focal deficits, speech normal Psych: Normal affect and mood     ASSESSMENT/PLAN:  Depressed mood Patient noted to have mild depression on PHQ-9 screener, scored a 14. Patient open to therapy and medication. Will screen with MDQ and further characterize depression before initiation of therapy at next visit. Patient mood ok today, with no thoughts of self harm or plans for suicide.  -MDQ at next appointment -Consider antidepressant if MDQ negative  Folliculitis Patient with notable lesions on left side of body, concerning for folliculitis. Patient complains of recurring cyst/abscess, that he lances and drains of pus. No sings/symptoms of systemic infection, lesions appear to be healing, non erythematous, no swelling. Will recommend cleaning area with cleaner and topical abx for care. -Hibiclens 2-3x wk -Bactroban over lesions with cleaning   Patient will f/u  for other issues in 2 weeks.   No orders of the defined types were placed in this encounter.  Meds ordered this encounter  Medications   mupirocin cream (BACTROBAN) 2 %   No follow-ups on file. @SIGNNOTE @

## 2021-12-17 NOTE — Patient Instructions (Signed)
It was great to see you! Thank you for allowing me to participate in your care!  I recommend that you always bring your medications to each appointment as this makes it easy to ensure we are on the correct medications and helps [place] not miss when refills are needed.  Our plans for today:  - Folliculitis  - Try soap called hibiclens, available over the counter, or ask for something like it. - Bactroban ointment, apply to affected area twice a day, as needed - Extreme Fatigue  Follow up in 2 weeks to discuss -Mild depression  Follow up in 2 weeks to discuss symptoms and possibly starting meds   Take care and seek immediate care sooner if you develop any concerns.   Dr. Bess Kinds, MD [facility]

## 2021-12-22 ENCOUNTER — Encounter: Payer: Self-pay | Admitting: Student

## 2021-12-22 ENCOUNTER — Ambulatory Visit (INDEPENDENT_AMBULATORY_CARE_PROVIDER_SITE_OTHER): Payer: Commercial Managed Care - HMO | Admitting: Student

## 2021-12-22 VITALS — BP 135/97 | HR 80 | Ht 72.0 in | Wt 235.0 lb

## 2021-12-22 DIAGNOSIS — R4589 Other symptoms and signs involving emotional state: Secondary | ICD-10-CM | POA: Diagnosis not present

## 2021-12-22 DIAGNOSIS — L739 Follicular disorder, unspecified: Secondary | ICD-10-CM

## 2021-12-22 MED ORDER — MUPIROCIN CALCIUM 2 % EX CREA: TOPICAL_CREAM | Freq: Two times a day (BID) | CUTANEOUS | Status: DC

## 2021-12-25 DIAGNOSIS — L739 Follicular disorder, unspecified: Secondary | ICD-10-CM | POA: Insufficient documentation

## 2021-12-25 NOTE — Assessment & Plan Note (Addendum)
Patient noted to have mild depression on PHQ-9 screener, scored a 14. Patient open to therapy and medication. Will screen with MDQ and further characterize depression before initiation of therapy at next visit. Patient mood ok today, with no thoughts of self harm or plans for suicide.  -MDQ at next appointment -Consider antidepressant if MDQ negative

## 2021-12-25 NOTE — Assessment & Plan Note (Signed)
Patient with notable lesions on left side of body, concerning for folliculitis. Patient complains of recurring cyst/abscess, that he lances and drains of pus. No sings/symptoms of systemic infection, lesions appear to be healing, non erythematous, no swelling. Will recommend cleaning area with cleaner and topical abx for care. -Hibiclens 2-3x wk -Bactroban over lesions with cleaning

## 2021-12-29 ENCOUNTER — Encounter: Payer: Self-pay | Admitting: Student

## 2021-12-29 MED ORDER — MUPIROCIN 2 % EX OINT: 1.0000 | TOPICAL_OINTMENT | Freq: Two times a day (BID) | CUTANEOUS | 0 refills | Status: DC

## 2021-12-29 NOTE — Addendum Note (Signed)
Addended by: Bess Kinds T on: 12/29/2021 06:40 PM   Modules accepted: Orders

## 2021-12-29 NOTE — Telephone Encounter (Signed)
Prescription placed as clinic administered medication.   Please place new order and send to [facility].   Thanks.  Veronda Prude, RN

## 2022-01-27 DIAGNOSIS — M5481 Occipital neuralgia: Secondary | ICD-10-CM | POA: Insufficient documentation

## 2022-02-01 ENCOUNTER — Telehealth: Payer: Self-pay

## 2022-02-01 ENCOUNTER — Other Ambulatory Visit: Payer: Self-pay | Admitting: Family Medicine

## 2022-02-01 ENCOUNTER — Ambulatory Visit (INDEPENDENT_AMBULATORY_CARE_PROVIDER_SITE_OTHER): Payer: Commercial Managed Care - HMO | Admitting: Family Medicine

## 2022-02-01 DIAGNOSIS — R6889 Other general symptoms and signs: Secondary | ICD-10-CM | POA: Diagnosis not present

## 2022-02-01 DIAGNOSIS — R4589 Other symptoms and signs involving emotional state: Secondary | ICD-10-CM | POA: Diagnosis not present

## 2022-02-01 MED ORDER — SERTRALINE HCL 50 MG PO TABS: ORAL_TABLET | ORAL | 3 refills | Status: DC

## 2022-02-01 MED ORDER — HYDROXYZINE PAMOATE 25 MG PO CAPS: 25.0000 mg | ORAL_CAPSULE | Freq: Three times a day (TID) | ORAL | 0 refills | Status: DC | PRN

## 2022-02-01 MED ORDER — FAMOTIDINE 20 MG PO TABS: 20.0000 mg | ORAL_TABLET | Freq: Two times a day (BID) | ORAL | 1 refills | Status: DC

## 2022-02-01 NOTE — Assessment & Plan Note (Signed)
Reportedly has flares of his Lyme disease that presents in a similar fashion.  Reports he has been on intermittent antibiotics for this in the past.  Shared decision making today to defer antibiotics and to restart antihistamines.  Given his anxiety/depression, opted to start hydroxyzine in addition to famotidine and his montelukast.  Examination today only showed slight erythema around umbilicus- not elsewhere. He is non-toxic appearing and without fever. He has close follow up with PCP in 2.5 weeks, can check in and see how he is doing at that time.

## 2022-02-01 NOTE — Assessment & Plan Note (Signed)
Undergoing separation from his wife.  Reports depressed mood over the last few months.  Verbal screening for bipolar disorder was negative-denied symptoms of mania.  Discussed common side effects of SSRIs.  Encouraged him to start therapy/counseling in addition to medications as this tends to have the best effect. -Start Sertraline 25 mg x 1 week, then increase to 50 mg  -Patient encouraged to go on [email or web address] to look for therapist -Has follow-up with PCP in 2.5 weeks.

## 2022-02-01 NOTE — Progress Notes (Signed)
    SUBJECTIVE:   CHIEF COMPLAINT / HPI:   [name] is a 51 y.o. male who presents to the [facility] clinic today to discuss the following concerns:   Rash, Joint Pain  States that he believes he is having another flare of his Lyme. He awoke with a rash around his b/l groins and umbilicus. States that that the rash tends to come and go, feels like tomorrow it will come back and spread up to his abdomen and chest. He also has intermittent chest pressure that makes him feel like he needs to cough. Also has thumb and ankle soreness that started again today as well as abdominal pain/frequent BM- has had several BM today. He reports this is a normal presentation for his flare. States that he stopped his 3 "histamines" about 1 month ago because he thought his flares were gone. He can recall being on Fexofenadine but doesn't remember the names of the other medications. Patient has a history of disseminated Lyme disease which required hospitalization in 2018 for 14 days. 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 He has been seen several times in the clinic for ongoing joint pain and rashes. Most recently seen at the end of July for rashes on his elbow, thigh, left hip.  He was diagnosed with folliculitis and recommended to use Hibiclens 2-3 times a week and Bactroban over lesions with cleaning.  In May he had blood work including ANA, CRP, RF which all were normal. X-ray of his knees showed mild medial compartment joint space narrowing b/l. He has been seen by an orthopedic doctor and rheumatologist for the symptoms.  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  Reports he has been on intermittent antibiotics for this in the past.  Shared decision making today to defer antibiotics and to restart antihistamines.  Given his anxiety/depression, opted to start hydroxyzine in addition to famotidine and his montelukast.  Examination today only showed slight erythema around umbilicus- not elsewhere. He is non-toxic appearing and without fever. He has close follow up with PCP in 2.5 weeks, can check in and see how he is doing at that time.   Depressed mood Undergoing separation from his wife.  Reports depressed mood over the last few months.  Verbal screening for bipolar disorder was negative-denied symptoms of mania.  Discussed common side effects of SSRIs.  Encouraged him to start therapy/counseling in addition to medications as this tends to have the best effect. -Start Sertraline 25 mg x 1 week, then increase to 50 mg  -Patient encouraged to go on [email or web address] to look for therapist -Has follow-up with PCP in 2.5 weeks.     Sabino Dick, DO [facility]

## 2022-02-01 NOTE — Telephone Encounter (Signed)
Patient calls nurse line regarding flare up with Lyme disease. He reports that he started having a rash that covers mid abdomen, chest, and upper thighs. He also reports join pain. He reports that he had a similar episode in 09/2021. He states that he is feeling irritation around rash on his chest. No active chest pain or shortness of breath.  Scheduled for this afternoon.  Veronda Prude, RN

## 2022-02-01 NOTE — Patient Instructions (Addendum)
It was wonderful to see you today.  Please bring ALL of your medications with you to every visit.   Today we talked about:  -Take hydroxyzine three times a day.  -Take famotidine twice a day. -Restart your Montelukast.  -We are starting you on a medication called Sertraline which can help with anxiety/depression. Please follow up with your primary care doctor in 2.5 weeks as scheduled.  -I would recommend you talk with a therapist/counselor.   To find a therapist visit [email or web address]. At this website you will be able to filter which providers take your click your insurance as well as other filters to fit your needs. For medicine management please call your insurance to find psychiatrist in network    Thank you for choosing [facility].   Please call [phone] with any questions about today's appointment.  Please be sure to schedule follow up at the front  desk before you leave today.   Sabino Dick, DO PGY-3 Family Medicine

## 2022-02-15 ENCOUNTER — Encounter: Payer: Self-pay | Admitting: Student

## 2022-02-18 ENCOUNTER — Ambulatory Visit: Payer: Commercial Managed Care - HMO | Admitting: Student

## 2022-02-21 ENCOUNTER — Other Ambulatory Visit: Payer: Self-pay | Admitting: Student

## 2022-02-21 MED ORDER — MONTELUKAST SODIUM 10 MG PO TABS: 10.0000 mg | ORAL_TABLET | Freq: Every day | ORAL | 0 refills | Status: DC

## 2022-03-04 ENCOUNTER — Ambulatory Visit (INDEPENDENT_AMBULATORY_CARE_PROVIDER_SITE_OTHER): Payer: Commercial Managed Care - HMO | Admitting: Student

## 2022-03-04 ENCOUNTER — Encounter: Payer: Self-pay | Admitting: Student

## 2022-03-04 DIAGNOSIS — R4589 Other symptoms and signs involving emotional state: Secondary | ICD-10-CM | POA: Diagnosis not present

## 2022-03-04 MED ORDER — SERTRALINE HCL 100 MG PO TABS: 100.0000 mg | ORAL_TABLET | Freq: Every day | ORAL | 2 refills | Status: DC

## 2022-03-04 NOTE — Patient Instructions (Signed)
It was great to see you! Thank you for allowing me to participate in your care!   Our plans for today:  - Anxiety and Depression  Increasing dose of antidepressant to 100 mg daily  Make follow up appointment in 4-6 weeks, sooner if needed  Take care and seek immediate care sooner if you develop any concerns.   Dr. Bess Kinds, MD [facility]  Therapy Resources: For information on therapists, please go to [email or web address]. You can also contact your insurance company to find an Hospital doctor.  Put in insurance and they will help you find a therapist  Listed below are Therapist that may take your insurance.    Therapy and Counseling Resources   [facility] (takes children) Location 1: [address], [address] [place], [place] [address] Location 2: [address] [place], [place] [address] [phone]   [facility] (spanish speaking therapist available)(habla espanol)(take medicare and medicaid)  [address], [place], [place] [address], [email or web address] [phone]  [facility]:Psychiatry and Counseling [address] [facility] [address] [facility] [address] [place], [place] [address] [phone]  [facility]   [address], [address] [place], [place] [address]      [phone]  [facility] (spanish available) [address]  [place], [place] [address] [phone]  [facility] (take [facility] and medicare) [address], [address]  [place], [place] [address]       [phone]     [facility] (virtual only) [phone]  Jovita Kussmaul [facility] [address], [place], [place] [phone]  [facility]:  [address] [place] [phone]  [facility]:  [address] Hessie Diener [phone]  [facility] (under & uninsured) [address]   [place] [phone]    [email or web address]    [place]  [facility] [address]  [place] Otto    [phone]  [facility] -[address]     Phone:  [phone]     [facility]-  [address]  [phone]   [facility] [address]      [place], [place] [phone]  [facility]: [address] [place], [place], [place]  [phone]   [facility] (Spanish therapist) [email or web address]  [address] [place]  [address]   [place] [address]    [phone]    [facility]   [address],  [place], [place]  [phone]   [facility]  [address] [place]  [phone]  [facility]  [address] [place], [place]        [phone]  Open Access/Walk In Clinic under & uninsured  [facility]  [address] [place], [place] [phone] Crisis [phone]  [facility] [place],  (Spanish)   [address], [place]: [phone]) 8:30 - 12; 1 - 2:30  [facility] [employer],  [address] [place], [place]    [phone]):8:30 - 12; 2 - 3PM  [facility] [employer],  [address]  [place]; [phone]):   Mon - Fri 8 AM - 5 PM  [facility] [address] [place]  MWF 12:30 to 3:00 or call to schedule an appointment  [phone]  Specific Provider options Psychology Today  [email or web address] click on find a therapist  enter your zip code left side and select or tailor a therapist for your specific need.   [facility] Provider Directory [email or web address]  (Medicaid)   Follow all drop down to find a provider  [place] or [email or web address] [address], [place], [place] Recovery support and educational   24- Hour Availability:   [facility]  [address] [place], [place] Crisis [phone]  [facility] [employer] [phone]  [place]  [phone]   [place]  [phone] (after hours)  Therapeutic Alternative/Mobile Crisis   [phone]  [place] National Suicide Hotline  [phone] Diamantina Monks)  Call 911 or go to emergency room  [facility]  [phone]);  [place] and [employer]  [phone]); [place], [place], [place], [place], [place], [place], [place]
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
## 2022-03-07 NOTE — Assessment & Plan Note (Addendum)
Patient continues to have symptoms of depression but appreciates an improvement in his anxiety. Still feeling down with significant social stressors, but denise any SI/HI. Patient encouraged to find a therapist, through psychology today, 2/2 to his insurance.  Will increase dose of Zoloft.  -Zoloft 100 mg -f/u 4-6 week

## 2022-04-11 ENCOUNTER — Encounter: Payer: Self-pay | Admitting: Student

## 2022-04-11 ENCOUNTER — Ambulatory Visit (INDEPENDENT_AMBULATORY_CARE_PROVIDER_SITE_OTHER): Payer: Commercial Managed Care - HMO | Admitting: Student

## 2022-04-11 VITALS — BP 106/68 | HR 85 | Ht 72.0 in | Wt 228.4 lb

## 2022-04-11 DIAGNOSIS — R4589 Other symptoms and signs involving emotional state: Secondary | ICD-10-CM

## 2022-04-11 DIAGNOSIS — R634 Abnormal weight loss: Secondary | ICD-10-CM

## 2022-04-11 DIAGNOSIS — M542 Cervicalgia: Secondary | ICD-10-CM

## 2022-04-11 DIAGNOSIS — M79644 Pain in right finger(s): Secondary | ICD-10-CM | POA: Diagnosis not present
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
## 2022-04-11 NOTE — Patient Instructions (Addendum)
It was great to see you! Thank you for allowing me to participate in your care!  I recommend that you always bring your medications to each appointment as this makes it easy to ensure we are on the correct medications and helps [place] not miss when refills are needed.  Our plans for today:  - Weight lost - try drinking Boost or Ensure  If you get down to 210 pound, make an appointment to be seen!  - Depression - Follow up in 2-3 months  Continue Zoloft  -Hand pain - Follow up with Rheum -Neck Pain - Follow up with Orthopedist   Take care and seek immediate care sooner if you develop any concerns.   Dr. Bess Kinds, MD [facility]

## 2022-04-15 NOTE — Assessment & Plan Note (Addendum)
Patient reports increase in pain and weakness of right thumb. Patient notes issues is associated with increased activity/work level. Patient is planning on f/u with Rheum for this issues as they helped him with this issue on his left thumb.  -F/u Rheum
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
## 2022-04-15 NOTE — Assessment & Plan Note (Signed)
Patient reports he's been losing weight, 2/2 pain/discomfort in hands and neck. Reports something similar happened in the past that got better once he got surgery for his pain (neck issues). Patient reporting pain and discomfort in hands and neck again, with plans to f/u with appropriate providers. Reports his weight is around 220-230's, weight today 228. Will continue to monitor. Encouraged patient to begin supplementing meals with Booste or Ensure.   -Booste or Ensure with meals - Make appointment if continuing to loose weight/ falls below 210.

## 2022-04-15 NOTE — Assessment & Plan Note (Signed)
Patient appreciates increase in neck pain, associated with increased activity/work. Patient plans on f/u with orthopedist to get neck injection.  -F/u with orthopedist

## 2022-04-16 ENCOUNTER — Emergency Department (HOSPITAL_COMMUNITY)
Admission: EM | Admit: 2022-04-16 | Discharge: 2022-04-16 | Discharge status: Against Medical Advice | Payer: Commercial Managed Care - HMO | Attending: Emergency Medicine | Admitting: Emergency Medicine

## 2022-04-16 ENCOUNTER — Other Ambulatory Visit: Payer: Self-pay

## 2022-04-16 DIAGNOSIS — Z5321 Procedure and treatment not carried out due to patient leaving prior to being seen by health care provider: Secondary | ICD-10-CM | POA: Diagnosis not present

## 2022-04-16 DIAGNOSIS — M542 Cervicalgia: Secondary | ICD-10-CM | POA: Insufficient documentation

## 2022-04-16 DIAGNOSIS — M549 Dorsalgia, unspecified: Secondary | ICD-10-CM | POA: Diagnosis not present

## 2022-04-16 MED ORDER — OXYCODONE-ACETAMINOPHEN 5-325 MG PO TABS
2.0000 | ORAL_TABLET | Freq: Once | ORAL | Status: AC
  Administered 2022-04-16: 2 via ORAL
  Filled 2022-04-16: qty 2

## 2022-04-16 NOTE — ED Triage Notes (Signed)
C/o neck and lower back pain , states he was supposed to get pain med refilled and the pharm. Will not have the med for 3 more days.

## 2022-04-16 NOTE — ED Provider Triage Note (Signed)
Emergency Medicine Provider Triage Evaluation Note  KHALI ALBANESE III , a 51 y.o. male  was evaluated in triage.  Pt complains of neck and back pain.  Hx of multiple surgeries, [facility]  States he was due for medication refill, however pharmacy is out and cannot fill out.  No meds in the past 2 days.  He states increased nerve pain in his neck and back.  Denies nausea/vomiting.  Reports he was given injection by Dr. Shon Baton at last visit but not helping.  Review of Systems  Positive: Neck pain, back pain Negative: fever  Physical Exam  BP (!) 144/97   Pulse (!) 109   Temp (!) 97.5 F (36.4 C) (Oral)   Resp 18   SpO2 97%  Gen:   Awake, no distress   Resp:  Normal effort  MSK:   Moves extremities without difficulty  Other:  Moving arms and legs without difficulty, ambulatory with steady gait  Medical Decision Making  Medically screening exam initiated at 3:14 AM.  Appropriate orders placed.  Dola Argyle Boniface III was informed that the remainder of the evaluation will be completed by another provider, this initial triage assessment does not replace that evaluation, and the importance of remaining in the ED until their evaluation is complete.  Per PDMP database, was due for refill yesterday and yet to be filled.  Will give his home dose of oxycodone for now.   Garlon Hatchet, PA-C 04/16/22 [phone]

## 2022-05-11 ENCOUNTER — Encounter: Payer: Self-pay | Admitting: Student

## 2022-05-11 ENCOUNTER — Ambulatory Visit (INDEPENDENT_AMBULATORY_CARE_PROVIDER_SITE_OTHER): Payer: Commercial Managed Care - HMO | Admitting: Student

## 2022-05-11 VITALS — BP 124/81 | HR 87 | Temp 98.6°F | Wt 230.0 lb

## 2022-05-11 DIAGNOSIS — R1013 Epigastric pain: Secondary | ICD-10-CM | POA: Diagnosis not present

## 2022-05-11 DIAGNOSIS — G8929 Other chronic pain: Secondary | ICD-10-CM | POA: Diagnosis not present

## 2022-05-11 DIAGNOSIS — M7989 Other specified soft tissue disorders: Secondary | ICD-10-CM

## 2022-05-11 NOTE — Assessment & Plan Note (Signed)
Patient notes 3 days of intense epigastric pain, with some nausea. Pain is sharp and stabbing and intermittent. Patient appreciates he has more pain when eating and has been taking an extra dose of his pain medicine for, breakthrough dose, for pain. Patient with epigastric abdominal pain extending into left costal area. Patient reports hx of pancreatitis. Abdominal exam w/ TTP in epigastric area. Patient's pain most concerning for ulcer, will obtain labs to determine risk of ulcer. Patient could also be suffering from pancreatitis, will obtain labs. If labs are negative and pain continues, will have patient obtain abdominal imaging and consider GI consult. -Amylase -CBC -CMP -f/u 1 wk -Consider abdominal imaging if pain persist -Consider GI referral if pain persisit
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
## 2022-05-11 NOTE — Assessment & Plan Note (Signed)
Patient with pain and decreased ROM in hands bilaterally. Patient report's this has been long standing issue and he saw Rheum for joint injections that helped. Patient requesting another referral to Rheum. Patient's hand's with notable decreased ROM and tenderness on exam. -Amb Rheum Referral

## 2022-05-11 NOTE — Patient Instructions (Addendum)
It was great to see you! Thank you for allowing me to participate in your care!  I recommend that you always bring your medications to each appointment as this makes it easy to ensure we are on the correct medications and helps [place] not miss when refills are needed.  Our plans for today:  Belly Pain - Labs for pancreatitis vs ulcer - Follow up in 1 week or sooner - Will consider imaging of belly/GI referral if pain does not improve Seek medical care if  Uncontrolled/can't stop vomiting  Pain unable to be controlled at home  Dehydration (dry lips, not making urine), and can't take fluids    Hand Pain -Referral to Dr. Dimple Casey w/ Rheum We are checking some labs today, I will call you if they are abnormal will send you a MyChart message or a letter if they are normal.  If you do not hear about your labs in the next 2 weeks please let us know.  Take care and seek immediate care sooner if you develop any concerns.   Dr. Bess Kinds, MD [facility]

## 2022-05-12 LAB — COMPREHENSIVE METABOLIC PANEL
ALT: 20 IU/L (ref 0–44)
AST: 25 IU/L (ref 0–40)
Albumin/Globulin Ratio: 2.1 (ref 1.2–2.2)
Albumin: 4.9 g/dL (ref 4.1–5.1)
Alkaline Phosphatase: 77 IU/L (ref 44–121)
BUN/Creatinine Ratio: 11 (ref 9–20)
BUN: 10 mg/dL (ref 6–24)
Bilirubin Total: 0.4 mg/dL (ref 0.0–1.2)
CO2: 23 mmol/L (ref 20–29)
Calcium: 9.7 mg/dL (ref 8.7–10.2)
Chloride: 102 mmol/L (ref 96–106)
Creatinine, Ser: 0.89 mg/dL (ref 0.76–1.27)
Globulin, Total: 2.3 g/dL (ref 1.5–4.5)
Glucose: 94 mg/dL (ref 70–99)
Potassium: 4.6 mmol/L (ref 3.5–5.2)
Sodium: 140 mmol/L (ref 134–144)
Total Protein: 7.2 g/dL (ref 6.0–8.5)
eGFR: 104 mL/min/{1.73_m2} (ref >59)

## 2022-05-12 LAB — CBC
Hematocrit: 47.2 % (ref 37.5–51.0)
Hemoglobin: 15.1 g/dL (ref 13.0–17.7)
MCH: 29.3 pg (ref 26.6–33.0)
MCHC: 32 g/dL (ref 31.5–35.7)
MCV: 92 fL (ref 79–97)
Platelets: 212 10*3/uL (ref 150–450)
RBC: 5.15 x10E6/uL (ref 4.14–5.80)
RDW: 12.9 % (ref 11.6–15.4)
WBC: 8.3 10*3/uL (ref 3.4–10.8)

## 2022-05-12 LAB — AMYLASE: Amylase: 62 U/L (ref 31–110)

## 2022-05-13 ENCOUNTER — Telehealth: Payer: Self-pay | Admitting: Student

## 2022-05-13 ENCOUNTER — Encounter: Payer: Self-pay | Admitting: Student

## 2022-05-13 NOTE — Telephone Encounter (Signed)
Called to inform patient that all lab work was negative. Also wanted to check on patient to see how he was doing. No f/u call needed at this time. Patient to make f/u appt to be seen if still having abdominal pain.

## 2022-05-25 ENCOUNTER — Other Ambulatory Visit: Payer: Self-pay | Admitting: Student

## 2022-06-08 ENCOUNTER — Ambulatory Visit: Payer: Commercial Managed Care - HMO | Admitting: Internal Medicine

## 2022-06-24 ENCOUNTER — Encounter: Payer: Self-pay | Admitting: Student

## 2022-06-24 DIAGNOSIS — L237 Allergic contact dermatitis due to plants, except food: Secondary | ICD-10-CM

## 2022-06-24 DIAGNOSIS — M79641 Pain in right hand: Secondary | ICD-10-CM

## 2022-06-27 MED ORDER — FLUTICASONE PROPIONATE 50 MCG/ACT NA SUSP: NASAL | 6 refills | Status: DC

## 2022-06-27 MED ORDER — FEXOFENADINE HCL 180 MG PO TABS: 180.0000 mg | ORAL_TABLET | Freq: Every day | ORAL | 3 refills | Status: DC

## 2022-06-27 MED ORDER — GABAPENTIN 300 MG PO CAPS: 600.0000 mg | ORAL_CAPSULE | Freq: Two times a day (BID) | ORAL | 5 refills | Status: DC

## 2022-07-06 ENCOUNTER — Encounter: Payer: Self-pay | Admitting: Internal Medicine

## 2022-07-06 ENCOUNTER — Ambulatory Visit: Payer: 59 | Attending: Internal Medicine | Admitting: Internal Medicine

## 2022-07-06 VITALS — BP 122/81 | HR 86 | Resp 20 | Ht 72.0 in | Wt 228.0 lb

## 2022-07-06 DIAGNOSIS — Z8619 Personal history of other infectious and parasitic diseases: Secondary | ICD-10-CM

## 2022-07-06 DIAGNOSIS — R6889 Other general symptoms and signs: Secondary | ICD-10-CM | POA: Diagnosis not present

## 2022-07-06 DIAGNOSIS — M7989 Other specified soft tissue disorders: Secondary | ICD-10-CM

## 2022-07-06 DIAGNOSIS — M79644 Pain in right finger(s): Secondary | ICD-10-CM

## 2022-07-06 NOTE — Progress Notes (Signed)
Office Visit Note  Patient: [name]             Date of Birth: [birth year]           MRN: [id number]             PCP: Holley Bouche, MD Referring: Holley Bouche, MD Visit Date: 07/06/2022   Subjective:  Follow-up (Patient states bilateral hand pain has gotten much worse.)   History of Present Illness: [name] is a 52 y.o. male here for follow up for his hand pain for which she had some previous improvement after addressing cervical spine impingement but is once again having worsening pain difficulty gripping tightly and intermittent swelling especially in the thumb.  He does not recall any new injury to the area.  No new skin rashes.  Does not feel like numbness or tingling like his previous carpal tunnel symptoms but more painful.  Previous HPI 04/12/21 [name] is a 52 y.o. male here for follow up due to increased pain and difficulty using his left thumb with frequent locking up and severely worse right thumb pain as well. He does not recall a specific starting incident with this joint pain but now has severe pain especially with pinching and gripping motions. He did not see significantly increased swelling.   Previous HPI 02/02/21 [name] is a 52 y.o. 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 Since our visit he underwent the cervical spine decompression surgery and is still recovering from this with sutures and dermabond in place and c-collar. He notices a big improvement in strength and mobility of his hands, although still soreness and his thumb remains painful and limited in extension and grip strength on the right. He has had some ongoing urticaria symptoms along his left side and axillary area.    Previous HPI 01/19/21 [name] is a 52 y.o. male here for  chronic pain and swelling of bilateral hands. He has had joint pains and inflammation since at least 2018 when he was evaluated with [facility] healthcare system including rheumatology and infectious disease workups. These were broadly negative for numerous serology including RF, CCP, ANA, and B. Burgdorferi Abs. He completed treatments with ceftriaxone and doxycycline at the time and no ongoing infectious concerns described by ID clinic. Rheumatology discussion of possible trying conventional DMARD in additional to chronic NSAID use. He has had ongoing subsequent orthopedics care with numerous degenerative and postraumatic changes in his cervical and lumbar spine and shoulders and previous SI joint fusion.  He was scheduled for anterior cervical spine decompression surgery urgency planned for tomorrow but apparently being postponed due to abnormal clotting factor test. Currently his worst complaint is increasing pain swelling and stiffness affecting his bilateral hands and wrist severely limiting his activities.  Pain is worst on the thumb and around the MCP joints right hand worse than left.  He has trouble gripping objects tightly and when he uses his hands a lot during the day will experience worsening symptoms for up to 3 days afterwards.  Occasionally there is redness overlying the affected joints when they are in exacerbation but otherwise often appears normal.  He is also developed difficulty fully extending his fingers especially the fourth and fifth fingers of his left hand.  His thumb catches  in a fixed position on the right side requiring use of his other hand or an object to restore motion.   Review of Systems  Constitutional:  Positive for fatigue.  HENT:  Positive for mouth dryness. Negative for mouth sores.   Eyes:  Negative for dryness.  Respiratory:  Negative for shortness of breath.   Cardiovascular:  Negative for chest pain and palpitations.  Gastrointestinal:  Negative for blood in  stool, constipation and diarrhea.  Endocrine: Negative for increased urination.  Genitourinary:  Negative for involuntary urination.  Musculoskeletal:  Positive for joint pain, gait problem, joint pain, joint swelling, myalgias, muscle weakness, morning stiffness, muscle tenderness and myalgias.  Skin:  Positive for hair loss and sensitivity to sunlight. Negative for color change and rash.  Allergic/Immunologic: Negative for susceptible to infections.  Neurological:  Positive for headaches. Negative for dizziness.  Hematological:  Negative for swollen glands.  Psychiatric/Behavioral:  Positive for sleep disturbance. Negative for depressed mood. The patient is not nervous/anxious.     PMFS History:  Patient Active Problem List   Diagnosis Date Noted   Contact dermatitis due to plant 09/13/2021   Depressed mood 05/08/2021   Pain in both hands 05/08/2021   Pain of right thumb 04/12/2021   Other spondylosis with myelopathy, cervical region 02/02/2021   Low back pain 01/19/2021   Swelling of both hands 12/22/2020   Neck pain on left side 09/30/2020   Gait abnormality 09/30/2020   Left-sided weakness 09/01/2020   History of Lyme disease 09/01/2020   Abdominal pain, chronic, epigastric 08/18/2020   Unintentional weight loss 08/18/2020   Chronic constipation 08/18/2020   History of pancreatitis 08/18/2020   Family history of colon cancer 08/18/2020   Cervical radiculopathy 05/14/2020   Essential hypertension 01/03/2020   Vitamin D deficiency 11/26/2019   Hyperglycemia 11/26/2019   Tobacco use 10/11/2019   Hyperlipemia    Fatty liver 05/06/2019   S/P lumbar fusion 11/15/2017   Labral tear of shoulder, degenerative, left 02/21/2017   Elevated LFTs 01/09/2017   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    Past Medical History:  Diagnosis Date   Angio-edema    Anxiety    Disseminated Lyme disease    Dystonia    Foot drop, right 2000   Headache    Hypertension 2019   Lyme  disease    Neck pain    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    Urticaria     Family History  Problem Relation Age of Onset   Heart disease Mother    Heart disease Father    Colon polyps Father    ALS Sister    Osteoarthritis Brother    Colon cancer Paternal Grandfather        dx at age 2   Healthy Son    Healthy Son    Esophageal cancer Neg Hx    Inflammatory bowel disease Neg Hx    Liver disease Neg Hx    Pancreatic cancer Neg Hx    Stomach cancer Neg Hx    Past Surgical History:  Procedure Laterality Date   ADENOIDECTOMY     ANTERIOR CERVICAL DECOMPRESSION/DISCECTOMY FUSION 4 LEVELS N/A 01/20/2021   Procedure: ANTERIOR CERVICAL DECOMPRESSION/DISCECTOMY FUSION 3  LEVELS C5-T1;  Surgeon: Melina Schools, MD;  Location: [place];  Service: Orthopedics;  Laterality: N/A;   BACK SURGERY     SACROILIAC JOINT FUSION Right 11/15/2017   Procedure: SACROILIAC JOINT FUSION;  Surgeon: Melina Schools, MD;  Location: [place];  Service: Orthopedics;  Laterality: Right;  120 mins   SACROILIAC JOINT FUSION Left 07/25/2018   Procedure: SACROILIAC JOINT FUSION;  Surgeon: Melina Schools, MD;  Location: [place];  Service: Orthopedics;  Laterality: Left;  SACROILIAC JOINT FUSION   TONSILLECTOMY     52 years old   Social History   Social History Narrative   Lives at home with his wife.   Right-handed.   Caffeine use: two cans of [facility].   Immunization History  Administered Date(s) Administered   Influenza Split 01/09/2017   Influenza,inj,quad, With Preservative 01/09/2017   Tdap 11/16/2004     Objective: Vital Signs: BP 122/81 (BP Location: Left Arm, Patient Position: Sitting, Cuff Size: Large)   Pulse 86   Resp 20   Ht 6' (1.829 m)   Wt 228 lb (103.4 kg)   BMI 30.92 kg/m    Physical Exam Cardiovascular:     Rate and Rhythm: Normal rate and regular rhythm.  Pulmonary:     Effort: Pulmonary effort is normal.     Breath sounds: Normal breath sounds.  Skin:    General: Skin is warm and  dry.     Findings: No rash.  Psychiatric:        Mood and Affect: Mood normal.      Musculoskeletal Exam:  Elbows full ROM no tenderness or swelling Wrists full ROM tenderness with pressure or with active extension Severely tender to pressure around the right first [facility] joint or with resisted movement of the thumb, no palpable synovitis  Investigation: No additional findings.  Imaging: No results found.  Recent Labs: Lab Results  Component Value Date   WBC 8.3 05/11/2022   HGB 15.1 05/11/2022   PLT 212 05/11/2022   NA 140 05/11/2022   K 4.6 05/11/2022   CL 102 05/11/2022   CO2 23 05/11/2022   GLUCOSE 94 05/11/2022   BUN 10 05/11/2022   CREATININE 0.89 05/11/2022   BILITOT 0.4 05/11/2022   ALKPHOS 77 05/11/2022   AST 25 05/11/2022   ALT 20 05/11/2022   PROT 7.2 05/11/2022   ALBUMIN 4.9 05/11/2022   CALCIUM 9.7 05/11/2022   GFRAA 112 06/08/2020    Speciality Comments: No specialty comments available.  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  Rechecking sedimentation rate and CRP for systemic inflammatory disease activity assessment.  Presentation is very focal not extremely characteristic for posttreatment Lyme syndrome.  Swelling of both hands Pain of right thumb  Considerable pain in the hands he is describing a lot of swelling I do not see overt joint effusion or synovitis but may have some soft tissue swelling around the base of the thumb.  X-rays obtained previously showed only mild degenerative arthritis changes at the first [facility] joint.  He did not have a durable benefit after intra-articular steroid injection so would not repeat today.  If inflammatory labs are  normal, given severity and duration of pain I think he would benefit with MRI to identify if there is some underlying structural problem or definite synovitis that might be amenable to more directed treatment.  Orders: Orders Placed This Encounter  Procedures   Sedimentation rate   C-reactive protein   No orders of the defined types were placed in this encounter.  Follow-Up Instructions: No follow-ups on file.   Collier Salina, MD  Note - This record has been created using [employer].  Chart creation errors have been sought, but may not always  have been located. Such creation errors do not reflect on  the standard of medical care.

## 2022-07-07 LAB — C-REACTIVE PROTEIN: CRP: 5.1 mg/L (ref <8.0)

## 2022-07-07 LAB — SEDIMENTATION RATE: Sed Rate: 14 mm/h (ref 0–15)

## 2022-07-29 ENCOUNTER — Telehealth: Payer: Self-pay | Admitting: Internal Medicine

## 2022-07-29 ENCOUNTER — Other Ambulatory Visit: Payer: Self-pay | Admitting: *Deleted

## 2022-07-29 DIAGNOSIS — M79644 Pain in right finger(s): Secondary | ICD-10-CM

## 2022-07-29 DIAGNOSIS — M180 Bilateral primary osteoarthritis of first carpometacarpal joints: Secondary | ICD-10-CM

## 2022-07-29 DIAGNOSIS — M7989 Other specified soft tissue disorders: Secondary | ICD-10-CM

## 2022-07-29 NOTE — Telephone Encounter (Signed)
I called patient, patient verbalized understanding, MRI for Right hand and wrist placed.

## 2022-07-29 NOTE — Telephone Encounter (Unsigned)
Patient called checking the status of his referral for an MRI of his right hand.  Patient states he hasn't received a call to schedule an appointment.

## 2022-07-29 NOTE — Telephone Encounter (Signed)
Note is now updated. We checked sed rate an CRP are normal and no specific evidence for lyme disease related inflammation from workup so far. Recommend he go for right hand and wrist MRI to evaluate the ongoing pain and decreased mobility, that is not responding to oral medications and local steroid injections. To rule out inflammatory arthritis versus a structural problem.

## 2022-08-01 ENCOUNTER — Encounter: Payer: Self-pay | Admitting: Student

## 2022-08-02 ENCOUNTER — Encounter: Payer: Self-pay | Admitting: Rheumatology

## 2022-08-02 ENCOUNTER — Ambulatory Visit (INDEPENDENT_AMBULATORY_CARE_PROVIDER_SITE_OTHER): Payer: 59 | Admitting: Family Medicine

## 2022-08-02 ENCOUNTER — Encounter: Payer: Self-pay | Admitting: Family Medicine

## 2022-08-02 VITALS — BP 132/88 | HR 92 | Ht 72.0 in | Wt 233.4 lb

## 2022-08-02 DIAGNOSIS — Z8669 Personal history of other diseases of the nervous system and sense organs: Secondary | ICD-10-CM | POA: Diagnosis not present

## 2022-08-02 DIAGNOSIS — R42 Dizziness and giddiness: Secondary | ICD-10-CM | POA: Diagnosis not present

## 2022-08-02 MED ORDER — PROPRANOLOL HCL ER 80 MG PO CP24: 80.0000 mg | ORAL_CAPSULE | Freq: Every day | ORAL | 0 refills | Status: DC

## 2022-08-02 NOTE — Patient Instructions (Signed)
We are to go ahead and start you on a medication called propranolol you will take once daily, which you to follow-up in the next 2 to 4 weeks with Dr. Marcina Millard.  This medication can take a while to start showing improvement in your migraines, but I think until you are able to get the injections this is where we should start.  I would recommend going to vestibular rehab to work on your dizziness.  Attached is a handout on the Epley maneuver which I want you to try when you are at home to see if that helps.

## 2022-08-02 NOTE — Progress Notes (Signed)
    SUBJECTIVE:   CHIEF COMPLAINT / HPI:   Dizziness - Occurs all day long - Worse with squatting and then standing up - Started about 2 weeks ago - Had a sinus infection a few weeks ago and then had worsening  - If turning his head or eyes too hard will get dizziness  Migraines  - Gets injections with his nerve in the back of his head - Follows with Dr. Krista Blue (neuro),  - Financial reasons is the reason he hasn't gotten his injection with his pain management yet - Started getting worse 2.5 weeks ago - The worst headache happened Saturday night (3/2) - felt like when he has gotten myelogram before with severe headache that dropped him to the floor - Took 1.5 hours to improve, did not have any prodromal symptoms - Having 2 migraines per day - Feels like exercise and heart rate. Having migraines with intercourse   PERTINENT  PMH / PSH: Reviewed  OBJECTIVE:   BP 132/88   Pulse 92   Ht 6' (1.829 m)   Wt 233 lb 6.4 oz (105.9 kg)   SpO2 99%   BMI 31.65 kg/m   General: NAD, well-appearing, well-nourished Respiratory: No respiratory distress, breathing comfortably, able to speak in full sentences Skin: warm and dry, no rashes noted on exposed skin Psych: Appropriate affect and mood  Neuro: CN II: PERRL CN III, IV,VI: EOMI CV V: Normal sensation in V1, V2, V3 CVII: Symmetric smile and brow raise CN VIII: Normal hearing CN IX,X: Symmetric palate raise  CN XI: 5/5 shoulder shrug CN XII: Symmetric tongue protrusion  UE and LE strength 5/5 Normal sensation in UE and LE bilaterally    ASSESSMENT/PLAN:   History of migraines Patient reported history of migraines, unclear based on chart review the treatment history. Patient reports getting epidural injections with pain management in the occipital region for migraines but is 2 months overdue for injection due to financial reasons. Patient having too many reported migraines to consider abortive medication. Reports elevated heart  rate will predispose to migraine. Reassuring neurologic exam. Discussed case with Dr. McDiarmid for recommendations.  - Trial of propanolol ER formulation  - Follow-up with PCP within 2 weeks - ER and return precautions discussed  Dizziness Unclear exact etiology of dizziness, but could by vestibular as well as related to migraines. Feel that trial of Epley maneuver would be appropriate with consideration for vestibular rehabilitation. - Vestibular rehab referral if not improving with Epley maneuver   Rise Patience, [place]

## 2022-08-03 ENCOUNTER — Encounter: Payer: Self-pay | Admitting: Rheumatology

## 2022-08-09 DIAGNOSIS — M5481 Occipital neuralgia: Secondary | ICD-10-CM | POA: Diagnosis not present

## 2022-08-12 ENCOUNTER — Other Ambulatory Visit: Payer: Self-pay

## 2022-08-12 DIAGNOSIS — E7800 Pure hypercholesterolemia, unspecified: Secondary | ICD-10-CM

## 2022-08-12 MED ORDER — ROSUVASTATIN CALCIUM 20 MG PO TABS: ORAL_TABLET | ORAL | 3 refills | Status: DC

## 2022-08-14 ENCOUNTER — Other Ambulatory Visit: Payer: 59

## 2022-08-14 ENCOUNTER — Ambulatory Visit
Admission: RE | Admit: 2022-08-14 | Discharge: 2022-08-14 | Disposition: A | Discharge status: Home / Self Care | Payer: 59 | Source: Ambulatory Visit | Attending: Rheumatology | Admitting: Rheumatology

## 2022-08-14 DIAGNOSIS — M180 Bilateral primary osteoarthritis of first carpometacarpal joints: Secondary | ICD-10-CM

## 2022-08-14 DIAGNOSIS — M79644 Pain in right finger(s): Secondary | ICD-10-CM

## 2022-08-14 DIAGNOSIS — M1811 Unilateral primary osteoarthritis of first carpometacarpal joint, right hand: Secondary | ICD-10-CM | POA: Diagnosis not present

## 2022-08-14 DIAGNOSIS — A6920 Lyme disease, unspecified: Secondary | ICD-10-CM | POA: Diagnosis not present

## 2022-08-14 DIAGNOSIS — M7989 Other specified soft tissue disorders: Secondary | ICD-10-CM

## 2022-08-18 DIAGNOSIS — M5030 Other cervical disc degeneration, unspecified cervical region: Secondary | ICD-10-CM | POA: Diagnosis not present

## 2022-08-18 DIAGNOSIS — G894 Chronic pain syndrome: Secondary | ICD-10-CM | POA: Insufficient documentation

## 2022-08-18 DIAGNOSIS — M5481 Occipital neuralgia: Secondary | ICD-10-CM | POA: Diagnosis not present

## 2022-08-18 DIAGNOSIS — Z981 Arthrodesis status: Secondary | ICD-10-CM | POA: Diagnosis not present

## 2022-08-18 DIAGNOSIS — M5412 Radiculopathy, cervical region: Secondary | ICD-10-CM | POA: Diagnosis not present

## 2022-08-19 ENCOUNTER — Ambulatory Visit (INDEPENDENT_AMBULATORY_CARE_PROVIDER_SITE_OTHER): Payer: Self-pay | Admitting: Family Medicine

## 2022-08-19 ENCOUNTER — Encounter: Payer: Self-pay | Admitting: Family Medicine

## 2022-08-19 ENCOUNTER — Other Ambulatory Visit: Payer: Self-pay

## 2022-08-19 VITALS — BP 105/65 | HR 67 | Ht 72.0 in | Wt 232.6 lb

## 2022-08-19 DIAGNOSIS — R220 Localized swelling, mass and lump, head: Secondary | ICD-10-CM

## 2022-08-19 NOTE — Progress Notes (Addendum)
    SUBJECTIVE:   CHIEF COMPLAINT / HPI:   Mouth Pain/Swelling Started propranolol 80mg  daily for migraine prophylaxis ~12 days ago 4 days later noticed roof of his mouth felt swollen Maybe getting worse since that time Has continued on propranolol this whole time No shortness of breath, rash, GI symptoms Some sore throat Doesn't think he burned the area, no local trauma that he can recall No rhinorrhea, cough, fever Patient does have a dentist Propranolol helping his migraines   PERTINENT  PMH / PSH: tobacco use (smoking 0.5PPD)  OBJECTIVE:   BP 105/65   Pulse 67   Ht 6' (1.829 m)   Wt 232 lb 9.6 oz (105.5 kg)   SpO2 99%   BMI 31.55 kg/m   Gen: alert, well-appearing, NAD Head: Van/AT Eyes: normal sclera and conjunctiva, PERRL Ears: external ears, canals, and TMs normal bilaterally Nose: nares patent, no appreciable turbinate hypertrophy Throat: small raised area on anterior hard palate that is tender to touch, no ulcerations or other lesions/masses, no tonsillar erythema, edema, or exudate Neck: no cervical lymphadenopathy CV: RRR, normal S1/S2 without m/r/g Resp: normal effort, lungs CTAB  Skin: no rashes on exposed skin Neuro: grossly intact    ASSESSMENT/PLAN:   Hard Palate Pain/Swelling Acute over past ~1 week. Small area of swelling that is tender to palpation on exam. Appearance suggestive of local trauma/burn. Less likely cyst or mass. Anticipate spontaneous resolution but advised dentistry evaluation. Continue propranolol for migraine prophylaxis as this is unrelated.   Tobacco Use Brief cessation counseling provided today. Advised patient schedule separate appointment if he's interested in further assistance with smoking cessation.  Alcus Dad, MD [place]

## 2022-08-19 NOTE — Patient Instructions (Signed)
It was great to see you!  The spot on the roof of your mouth looks like an area of irritation, like you could have burned it. Sometimes people can get cysts here as well. I expect your symptoms to get better over the next few weeks, but please see your dentist for further evaluation.  This is not related to the propranolol, so you should continue this once daily.  The best thing you can do for your overall health is to quit smoking. If you would like assistance with this, please schedule a separate appointment and we'd be happy to help.  Take care, Dr Rock Nephew

## 2022-08-24 NOTE — Progress Notes (Signed)
MRI of the right hand shows osteoarthritis damage most severe at the base of the thumb joint. Image report described as moderate severity. There is no evidence of rheumatoid arthritis or lyme associated joint inflammation. We have not had great resolution of symptoms so far with oral antiinflammatory medication or steroid injections. I think he will be best served to follow up again with his hand surgeon about options for this.

## 2022-08-24 NOTE — Progress Notes (Signed)
Please review

## 2022-08-26 ENCOUNTER — Other Ambulatory Visit: Payer: Self-pay | Admitting: Family Medicine

## 2022-08-31 ENCOUNTER — Emergency Department (HOSPITAL_COMMUNITY)
Admission: EM | Admit: 2022-08-31 | Discharge: 2022-08-31 | Disposition: A | Discharge status: Home / Self Care | Payer: 59 | Attending: Emergency Medicine | Admitting: Emergency Medicine

## 2022-08-31 ENCOUNTER — Emergency Department (HOSPITAL_COMMUNITY): Payer: 59

## 2022-08-31 ENCOUNTER — Other Ambulatory Visit: Payer: Self-pay

## 2022-08-31 DIAGNOSIS — M25572 Pain in left ankle and joints of left foot: Secondary | ICD-10-CM | POA: Diagnosis not present

## 2022-08-31 MED ORDER — NAPROXEN 375 MG PO TABS: 375.0000 mg | ORAL_TABLET | Freq: Two times a day (BID) | ORAL | 0 refills | Status: DC

## 2022-08-31 MED ORDER — KETOROLAC TROMETHAMINE 60 MG/2ML IM SOLN
60.0000 mg | Freq: Once | INTRAMUSCULAR | Status: AC
  Administered 2022-08-31: 60 mg via INTRAMUSCULAR
  Filled 2022-08-31: qty 2

## 2022-08-31 NOTE — Progress Notes (Signed)
Orthopedic Tech Progress Note Patient Details:  [name] [birth year] [id number]  Ortho Devices Type of Ortho Device: CAM walker, Crutches Ortho Device/Splint Location: LLE Ortho Device/Splint Interventions: Application   Post Interventions Patient Tolerated: Well  [name] [name] 08/31/2022, 7:08 PM

## 2022-08-31 NOTE — ED Triage Notes (Signed)
PT c/o L ankle pain, rated 9/10. States they heard a pop last night (4/2), when pain started. Pain has increased since then, states pain became unbearable around an hour ago at work. Used ice and home Rx of oxycodone with no relief.

## 2022-08-31 NOTE — Discharge Instructions (Addendum)
Contact a health care provider if: Your pain gets worse. Your pain is not relieved with medicines. You have a fever or chills. You are having more trouble with walking. You have new symptoms. Get help right away if: Your foot, leg, toes, or ankle: Tingles or becomes numb. Becomes swollen. Turns pale or blue. 

## 2022-08-31 NOTE — ED Provider Notes (Signed)
[place] Provider Note   CSN: [id number] Arrival date & time: 08/31/22  1613     History  Chief Complaint  Patient presents with   Ankle Pain    [name] is a 52 y.o. male who presents with Ankle pain. Patient states he felt a "pop" in his L ankle last night.  He has had progressively worsening pain throughout the day and is now having severe pain whenever he tries to bear weight on the ankle.  He took an oxycodone at home without any significant relief and presented here for further evaluation.  He denies any previous ankle injuries.  He has an established relationship at [facility].  He denies numbness or tingling.  Pain is in the ankle joint.   Ankle Pain      Home Medications Prior to Admission medications   Medication Sig Start Date End Date Taking? Authorizing Provider  naproxen (NAPROSYN) 375 MG tablet Take 1 tablet (375 mg total) by mouth 2 (two) times daily with a meal. 08/31/22  Yes Pat Elicker, PA-C  acetaminophen (TYLENOL) 500 MG tablet 500 mg by oral route. 06/21/20   [provider]  Acetaminophen 325 MG CAPS Take 1-2 tablets by mouth every 4 (four) hours as needed. 10/13/21   Autry-Lott, Naaman Plummer, DO  Ascorbic Acid (VITAMIN C) 500 MG CHEW Chew 1 tablet by mouth daily.    [provider]  famotidine (PEPCID) 20 MG tablet TAKE 1 TABLET(20 MG) BY MOUTH TWICE DAILY 02/01/22   Holley Bouche, MD  fexofenadine [facility] ALLERGY) 180 MG tablet Take 1 tablet (180 mg total) by mouth daily. 06/27/22   Holley Bouche, MD  fluticasone (FLONASE) 50 MCG/ACT nasal spray SHAKE LIQUID AND USE 2 SPRAYS IN EACH NOSTRIL DAILY Strength: 50 MCG/ACT 06/27/22   Holley Bouche, MD  gabapentin (NEURONTIN) 300 MG capsule Take 2 capsules (600 mg total) by mouth 2 (two) times daily. 06/27/22   Holley Bouche, MD  hydrOXYzine (VISTARIL) 25 MG capsule Take 1 capsule (25 mg total) by mouth 3 (three) times daily as  needed. Patient not taking: Reported on 08/02/2022 02/01/22   Sharion Settler, DO  montelukast (SINGULAIR) 10 MG tablet TAKE 1 TABLET(10 MG) BY MOUTH AT BEDTIME 02/21/22   Holley Bouche, MD  Multiple Vitamins-Minerals (MENS MULTIVITAMIN) CHEW Chew 1 tablet by mouth daily.    [provider]  mupirocin ointment (BACTROBAN) 2 % Apply 1 Application topically 2 (two) times daily. Apply to affected areas 12/29/21   Holley Bouche, MD  oxyCODONE (ROXICODONE) 15 MG immediate release tablet Take 1 tablet 4 times a day by oral route as needed. 06/28/22   [provider]  Oxycodone HCl 10 MG TABS Take 10 mg by mouth 4 (four) times daily as needed. Patient not taking: Reported on 08/02/2022 09/28/21   [provider]  oxyCODONE-acetaminophen (PERCOCET) 10-325 MG tablet TK 1 T PO Q 6 H PRN FOR UP TO 5 DAYS P Patient not taking: Reported on 08/02/2022    [provider]  propranolol ER (INDERAL LA) 80 MG 24 hr capsule TAKE 1 CAPSULE BY MOUTH EVERY DAY 08/31/22   Holley Bouche, MD  rosuvastatin (CRESTOR) 20 MG tablet TAKE 1 TABLET(20 MG) BY MOUTH DAILY Strength: 20 mg 08/12/22   Holley Bouche, MD  sertraline (ZOLOFT) 100 MG tablet TAKE 1 TABLET(100 MG) BY MOUTH DAILY 05/25/22   Holley Bouche, MD  triamcinolone (NASACORT ALLERGY 24HR) 55 MCG/ACT AERO nasal inhaler 1 {spray by nasal  route.    [provider]  triamcinolone ointment (KENALOG) 0.1 % APPLY TOPICALLY TO THE AFFECTED AREA TWICE DAILY Patient not taking: Reported on 08/02/2022    [provider]  valsartan (DIOVAN) 40 MG tablet Take 1 tablet (40 mg total) by mouth daily. 10/13/21   Lorretta Harp, MD      Allergies    Pregabalin and Sulfa antibiotics    Review of Systems   Review of Systems  Physical Exam Updated Vital Signs BP 125/82 (BP Location: Right Arm)   Pulse 87   Temp 98.4 F (36.9 C)   Resp 17   Ht 6' (1.829 m)   Wt 109.3 kg   SpO2 96%   BMI 32.69 kg/m  Physical Exam Vitals  and nursing note reviewed.  Constitutional:      General: He is not in acute distress.    Appearance: He is well-developed. He is not diaphoretic.  HENT:     Head: Normocephalic and atraumatic.  Eyes:     General: No scleral icterus.    Conjunctiva/sclera: Conjunctivae normal.  Cardiovascular:     Rate and Rhythm: Normal rate and regular rhythm.     Heart sounds: Normal heart sounds.  Pulmonary:     Effort: Pulmonary effort is normal. No respiratory distress.     Breath sounds: Normal breath sounds.  Abdominal:     Palpations: Abdomen is soft.     Tenderness: There is no abdominal tenderness.  Musculoskeletal:     Cervical back: Normal range of motion and neck supple.     Comments: Bilateral ankle exam performed.  Right ankle exam normal.  Left ankle exam shows no swelling, no deformities, no bruising.  Tenderness along the anterior joint line, limited passive and active range of motion, normal DP and PT pulse, 2+ capillary refill.  Normal sensation.  Skin:    General: Skin is warm and dry.  Neurological:     Mental Status: He is alert.  Psychiatric:        Behavior: Behavior normal.     ED Results / Procedures / Treatments   Labs (all labs ordered are listed, but only abnormal results are displayed) Labs Reviewed - No data to display  EKG None  Radiology DG Ankle Complete Left  Result Date: 08/31/2022 CLINICAL DATA:  Left ankle pain EXAM: LEFT ANKLE COMPLETE - 3 VIEW COMPARISON:  None Available. FINDINGS: No fracture or dislocation. Preserved joint spaces and bone mineralization. Presumed bone island along the distal tibial metaphysis. IMPRESSION: No acute osseous abnormality Electronically Signed   By: Jill Side M.D.   On: 08/31/2022 16:56    Procedures Procedures    Medications Ordered in ED Medications  ketorolac (TORADOL) injection 60 mg (has no administration in time range)    ED Course/ Medical Decision Making/ A&P                             Medical  Decision Making Patient here with ankle pain of unknown etiology, possible internal derangement however no obvious effusion or swelling.  I doubt any other emergent cause such as acute arterial occlusion.  I visualized and interpreted left ankle plain film which shows no acute findings.  CAM Walker collar and crutches ordered.  Will have him follow-up as soon as possible with [facility].  Patient will be discharged with anti-inflammatory medications.  Given a shot of Toradol here for discomfort.  Final Clinical Impression(s) / ED  Diagnoses Final diagnoses:  Acute left ankle pain    Rx / DC Orders ED Discharge Orders          Ordered    naproxen (NAPROSYN) 375 MG tablet  2 times daily with meals        08/31/22 1825              Margarita Mail, PA-C 08/31/22 1826    Tretha Sciara, MD 09/01/22 1526

## 2022-09-02 ENCOUNTER — Ambulatory Visit (INDEPENDENT_AMBULATORY_CARE_PROVIDER_SITE_OTHER): Payer: 59 | Admitting: Student

## 2022-09-02 ENCOUNTER — Encounter: Payer: Self-pay | Admitting: Student

## 2022-09-02 VITALS — BP 123/81 | HR 78 | Ht 72.0 in | Wt 232.8 lb

## 2022-09-02 DIAGNOSIS — S93492A Sprain of other ligament of left ankle, initial encounter: Secondary | ICD-10-CM

## 2022-09-02 NOTE — Progress Notes (Unsigned)
  SUBJECTIVE:   CHIEF COMPLAINT / HPI:   Left ankle pain Patient seen in ED on 08/31/22 for ankle pain after hearing a pop. Imaging negative, sent home w/ rx for naproxen. Due to f/u w/ [facility]? On Oxy 10 QID  Ankle started out feeling sore, and he thought he sparined it, and wrapped it up and thought it would help, but it continued to hurt/got worse while going down stairs. He denies any trauma, but was stepping down a step and said it hurt. Heard a pop, but thought that was nothing new, as it pops often. Is icing it twice a day for 10-15 min at time. This all happen the night of the 2nd, and was worse the night of the 3rd, and the day of the 4th it was the most painful, and he couldn't walk on it.   PERTINENT  PMH / PSH: ***  Past Medical History:  Diagnosis Date   Angio-edema    Anxiety    Disseminated Lyme disease    Dystonia    Foot drop, right 2000   Headache    Hypertension 2019   Lyme disease    Neck pain    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    Urticaria     Patient Care Team: Bess Kinds, MD as PCP - General (Family Medicine) Sheran Luz, MD as Consulting Physician (Physical Medicine and Rehabilitation) Venita Lick, MD as Consulting Physician (Orthopedic Surgery) OBJECTIVE:  There were no vitals taken for this visit. Physical Exam   ASSESSMENT/PLAN:  There are no diagnoses linked to this encounter. No follow-ups on file. Bess Kinds, MD 09/02/2022, 9:04 AM PGY-***, [facility] {    This will disappear when note is signed, click to select method of visit    :1}

## 2022-09-02 NOTE — Patient Instructions (Signed)
It was great to see you! Thank you for allowing me to participate in your care!  It looks like you have an ankle sprain. We will treat this with rest and ice, and will rehab the ankle after about a week of healing.  Our plans for today:  - Rest and Ice ankle every 3-4 hours for first 2-3 days. Ice ankle daily after that - Make follow up appointment in 1 week, to be seen in clinic to trend progress - Ankle rehab exercises after a week of healing - Don't need to go to [facility], but their contact is below if you would like second opinion.  [facility] [address] [place], [place], [place] [address] Phone: [phone]  Take care and seek immediate care sooner if you develop any concerns.   Dr. Bess Kinds, MD [facility]

## 2022-09-06 DIAGNOSIS — S93492A Sprain of other ligament of left ankle, initial encounter: Secondary | ICD-10-CM | POA: Insufficient documentation

## 2022-09-06 NOTE — Assessment & Plan Note (Signed)
Patient noting pain in ATFL area of ankle after hearing it pop while going down some steps. Was seen in ED for 2-3 days of worsening swelling and pain, x-rak negative. Patient has good ROM, with notable tenderness in ATFL, no tenderness over maleoli. Will recommend rest and ice for few days, and then rehab exercises in 1 week.  -Rest and Ice q3h for 20 min for Day 1-3 -Rehab exercises after a week of healing -F/u 1 wk

## 2022-09-15 ENCOUNTER — Ambulatory Visit (INDEPENDENT_AMBULATORY_CARE_PROVIDER_SITE_OTHER): Payer: Self-pay | Admitting: Student

## 2022-09-15 ENCOUNTER — Encounter: Payer: Self-pay | Admitting: Student

## 2022-09-15 VITALS — BP 106/69 | HR 63 | Ht 72.0 in | Wt 230.2 lb

## 2022-09-15 DIAGNOSIS — S93492D Sprain of other ligament of left ankle, subsequent encounter: Secondary | ICD-10-CM

## 2022-09-15 NOTE — Patient Instructions (Signed)
It was great to see you! Thank you for allowing me to participate in your care!  I recommend that you always bring your medications to each appointment as this makes it easy to ensure we are on the correct medications and helps [place] not miss when refills are needed.  Our plans for today:  - CTM ankle pain - Attempt to rest ankle as much as possible - consider lidocaine patch for pain - F/u w/ [facility] if worse, or if not better in 2 wk  Take care and seek immediate care sooner if you develop any concerns.   Dr. Bess Kinds, MD [facility]

## 2022-09-15 NOTE — Assessment & Plan Note (Signed)
Patient hear for 2 wk f/u for ATFL sprain. Notes pain is still significant, especially at end of day. Patient note's he's on his feet all day for work. He has started rehab exercises and is using Voltaren gel and ice for pain. No swelling appreciated on exam and TTP in ATFL area. Patient likely having slower healing given continual use of ankle. Will recommend f/u w/ sports med in 2 weeks, if pain not improved, for US imaging and management. -F/u 2 wks -Contact for [facility] given, patient encouraged to see them if things get worse or are intolerable

## 2022-09-15 NOTE — Progress Notes (Signed)
  SUBJECTIVE:   CHIEF COMPLAINT / HPI:   Left Ankle Sprain F/u ankle sprain 09/02/22, that occurred while going down stairs. -Negative imaging -Rec rest for 1 wk, and f/u w/ therapy exercises after  Reports ankle is still sore and hurts a lot at end of day. He has been using Voltaren gel and ice on it, and it helps, just doesn't last long. He's started doing the rehab exercises but also notes that he is on his feet all day and the pain is near unbearable at end of the day.    PERTINENT  PMH / PSH: HTN, Ankle Sprain   Patient Care Team: Bess Kinds, MD as PCP - General (Family Medicine) Sheran Luz, MD as Consulting Physician (Physical Medicine and Rehabilitation) Venita Lick, MD as Consulting Physician (Orthopedic Surgery) OBJECTIVE:  BP 106/69   Pulse 63   Ht 6' (1.829 m)   Wt 230 lb 3.2 oz (104.4 kg)   SpO2 97%   BMI 31.22 kg/m  Physical Exam Musculoskeletal:     Left ankle: No swelling, deformity, ecchymosis or lacerations. Tenderness present over the ATF ligament.      ASSESSMENT/PLAN:  Sprain of anterior talofibular ligament of left ankle, subsequent encounter Assessment & Plan: Patient hear for 2 wk f/u for ATFL sprain. Notes pain is still significant, especially at end of day. Patient note's he's on his feet all day for work. He has started rehab exercises and is using Voltaren gel and ice for pain. No swelling appreciated on exam and TTP in ATFL area. Patient likely having slower healing given continual use of ankle. Will recommend f/u w/ sports med in 2 weeks, if pain not improved, for US imaging and management. -F/u 2 wks -Contact for [facility] given, patient encouraged to see them if things get worse or are intolerable    No follow-ups on file. Bess Kinds, MD 09/15/2022, 4:11 PM PGY-2, [facility]

## 2022-09-19 IMAGING — CR DG CHEST 2V
2 series · 2 of 2 positions shown · non-contrast
Comparison: June 07, 2020.

CLINICAL DATA: Preop for cervical surgery.

EXAM:
CHEST - 2 VIEW

[w chest pa]
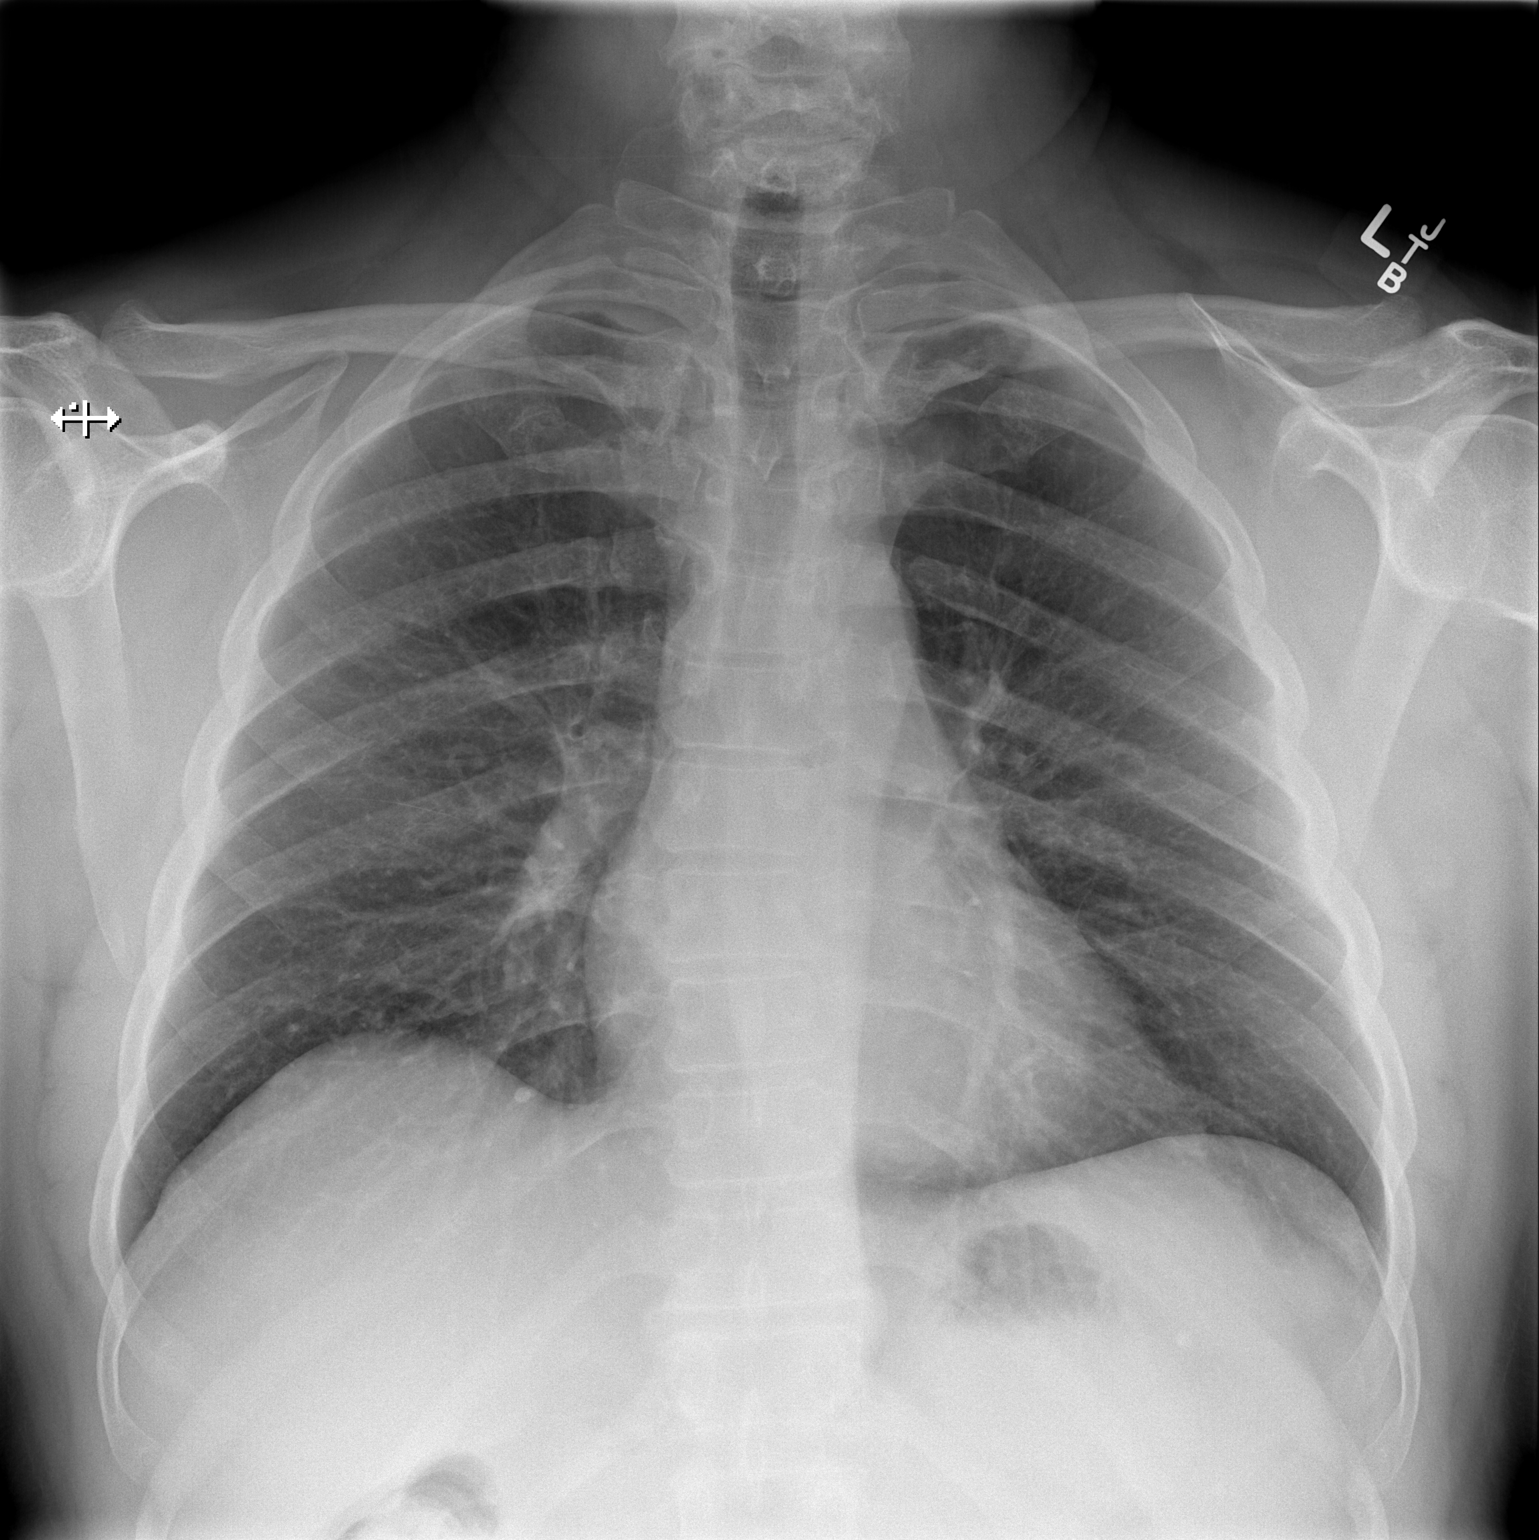

[w chest lat]
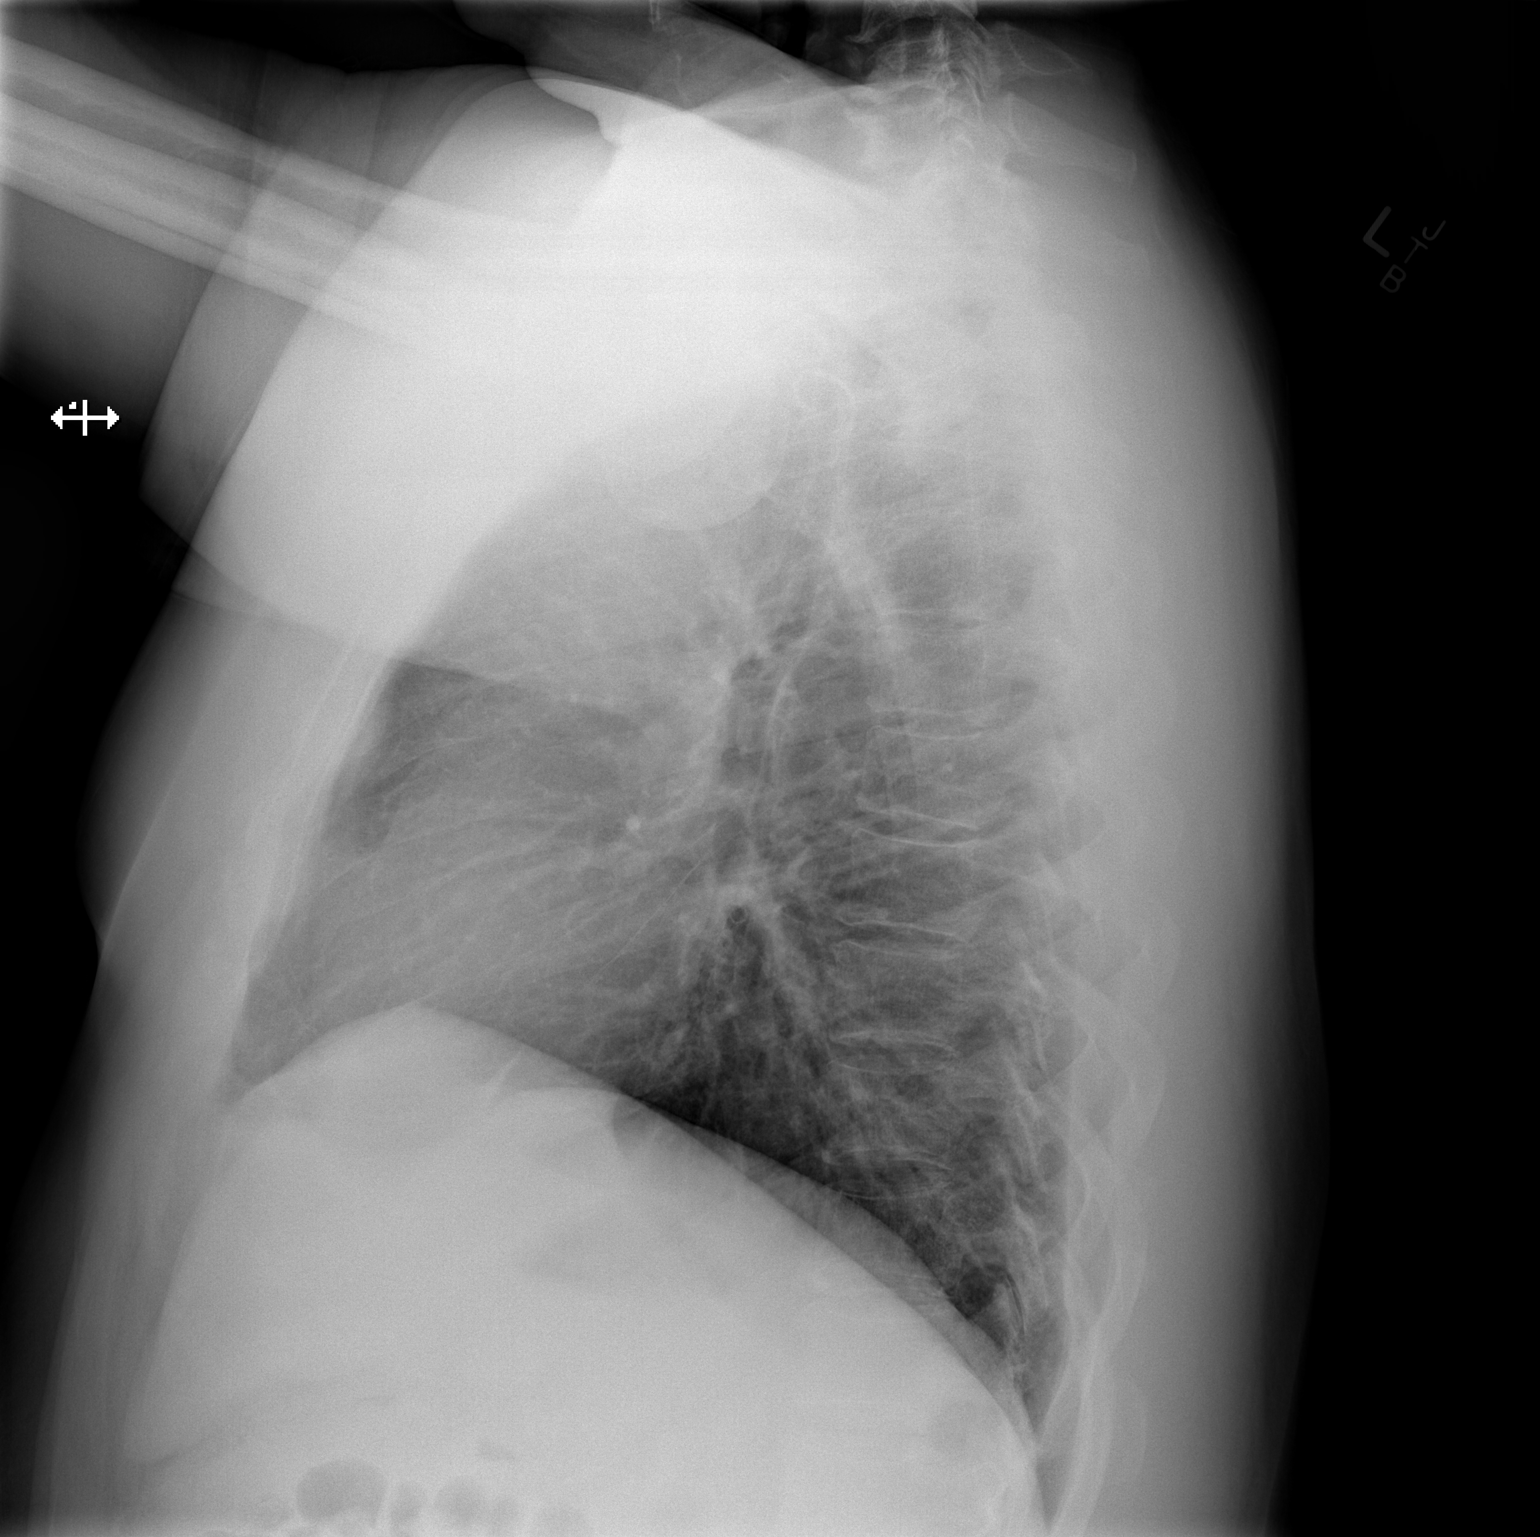

[2 of 2 positions shown; findings below may reference images not displayed]

FINDINGS: The heart size and mediastinal contours are within normal limits.
Both lungs are clear. The visualized skeletal structures are
unremarkable.
IMPRESSION: No active cardiopulmonary disease.

## 2022-09-26 ENCOUNTER — Other Ambulatory Visit: Payer: Self-pay | Admitting: Cardiovascular Disease

## 2022-09-26 DIAGNOSIS — I10 Essential (primary) hypertension: Secondary | ICD-10-CM

## 2022-09-28 ENCOUNTER — Ambulatory Visit: Payer: Self-pay | Admitting: Sports Medicine

## 2022-10-05 ENCOUNTER — Ambulatory Visit: Payer: Self-pay | Admitting: Sports Medicine

## 2022-10-19 ENCOUNTER — Other Ambulatory Visit: Payer: Self-pay | Admitting: Cardiovascular Disease

## 2022-10-19 DIAGNOSIS — I10 Essential (primary) hypertension: Secondary | ICD-10-CM

## 2022-10-29 DIAGNOSIS — Z419 Encounter for procedure for purposes other than remedying health state, unspecified: Secondary | ICD-10-CM | POA: Diagnosis not present

## 2022-11-21 ENCOUNTER — Encounter: Payer: Self-pay | Admitting: Family Medicine

## 2022-11-21 ENCOUNTER — Ambulatory Visit (INDEPENDENT_AMBULATORY_CARE_PROVIDER_SITE_OTHER): Payer: Medicaid Other | Admitting: Family Medicine

## 2022-11-21 ENCOUNTER — Other Ambulatory Visit: Payer: Self-pay | Admitting: Student

## 2022-11-21 VITALS — BP 110/78 | HR 73 | Ht 73.0 in | Wt 233.0 lb

## 2022-11-21 DIAGNOSIS — B078 Other viral warts: Secondary | ICD-10-CM

## 2022-11-21 DIAGNOSIS — M79641 Pain in right hand: Secondary | ICD-10-CM

## 2022-11-21 NOTE — Progress Notes (Signed)
    SUBJECTIVE:   CHIEF COMPLAINT / HPI:    Reports brown spot on his right hand that started about 2 weeks ago. Had a similar area on his left hand about 2 months ago which went away on its own but reports that it turned into a wound. Reports the area is painful. Denies any new medications, soaps, detergents, etc.  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  PERTINENT  PMH / PSH: HTN, tobacco use, Lyme disease  Patient Care Team: Bess Kinds, MD as PCP - General (Family Medicine) Sheran Luz, MD as Consulting Physician (Physical Medicine and Rehabilitation) Venita Lick, MD as Consulting Physician (Orthopedic Surgery)   OBJECTIVE:   BP 110/78   Pulse 73   Ht 6\' 1"  (1.854 m)   Wt 233 lb (105.7 kg)   SpO2 98%   BMI 30.74 kg/m   Physical Exam Constitutional:      General: He is not in acute distress. Cardiovascular:     Rate and Rhythm: Normal rate and regular rhythm.  Pulmonary:     Effort: Pulmonary effort is normal. No respiratory distress.     Breath sounds: Normal breath sounds.  Skin:    Comments: Right palmar surface with rugous, rough, scaly patch with few scattered pinpoint hyperpigmented macules of various sizes  Neurological:     Mental Status: He is alert.           11/21/2022    1:55 PM  Depression screen PHQ 2/9  Decreased Interest 1  Down, Depressed, Hopeless 1  PHQ - 2 Score 2  Altered sleeping 2  Tired, decreased energy 3  Change in appetite 3  Feeling bad or failure about yourself  1  Trouble concentrating 2  Moving slowly or fidgety/restless 0  Suicidal thoughts 0  PHQ-9 Score 13     {Show previous vital signs (optional):23777}    ASSESSMENT/PLAN:   1. Palmar wart Lesion of right palm present for 2 weeks most consistent with wart.  Offered cryotherapy versus topical salicylic acid.  He will obtain over-the-counter Trans-Ver-Sal topical  salicylic acid.  Return if symptoms worsen or fail to improve.   Littie Deeds, MD [facility]

## 2022-11-21 NOTE — Patient Instructions (Addendum)
It was nice seeing you today!  You can try some Trans-Ver-Sal patches for the warts.  Stay well, Littie Deeds, MD [facility] [phone]  --  Make sure to check out at the front desk before you leave today.  Please arrive at least 15 minutes prior to your scheduled appointments.  If you had blood work today, I will send you a MyChart message or a letter if results are normal. Otherwise, I will give you a call.  If you had a referral placed, they will call you to set up an appointment. Please give [place] a call if you don't hear back in the next 2 weeks.  If you need additional refills before your next appointment, please call your pharmacy first.

## 2022-11-22 DIAGNOSIS — M1812 Unilateral primary osteoarthritis of first carpometacarpal joint, left hand: Secondary | ICD-10-CM | POA: Diagnosis not present

## 2022-11-22 DIAGNOSIS — M1811 Unilateral primary osteoarthritis of first carpometacarpal joint, right hand: Secondary | ICD-10-CM | POA: Diagnosis not present

## 2022-11-28 DIAGNOSIS — Z419 Encounter for procedure for purposes other than remedying health state, unspecified: Secondary | ICD-10-CM | POA: Diagnosis not present

## 2022-12-29 DIAGNOSIS — Z419 Encounter for procedure for purposes other than remedying health state, unspecified: Secondary | ICD-10-CM | POA: Diagnosis not present

## 2023-01-02 DIAGNOSIS — Z4889 Encounter for other specified surgical aftercare: Secondary | ICD-10-CM | POA: Diagnosis not present

## 2023-01-02 DIAGNOSIS — M961 Postlaminectomy syndrome, not elsewhere classified: Secondary | ICD-10-CM | POA: Diagnosis not present

## 2023-01-02 DIAGNOSIS — G894 Chronic pain syndrome: Secondary | ICD-10-CM | POA: Diagnosis not present

## 2023-01-02 DIAGNOSIS — M5481 Occipital neuralgia: Secondary | ICD-10-CM | POA: Diagnosis not present

## 2023-01-02 DIAGNOSIS — Z79899 Other long term (current) drug therapy: Secondary | ICD-10-CM | POA: Diagnosis not present

## 2023-01-02 DIAGNOSIS — M5412 Radiculopathy, cervical region: Secondary | ICD-10-CM | POA: Diagnosis not present

## 2023-01-02 DIAGNOSIS — M5030 Other cervical disc degeneration, unspecified cervical region: Secondary | ICD-10-CM | POA: Diagnosis not present

## 2023-01-02 DIAGNOSIS — A6920 Lyme disease, unspecified: Secondary | ICD-10-CM | POA: Diagnosis not present

## 2023-01-03 DIAGNOSIS — M13841 Other specified arthritis, right hand: Secondary | ICD-10-CM | POA: Diagnosis not present

## 2023-01-03 DIAGNOSIS — M13842 Other specified arthritis, left hand: Secondary | ICD-10-CM | POA: Diagnosis not present

## 2023-01-08 ENCOUNTER — Encounter: Payer: Self-pay | Admitting: Student

## 2023-01-09 ENCOUNTER — Other Ambulatory Visit (HOSPITAL_COMMUNITY): Payer: Self-pay

## 2023-01-13 ENCOUNTER — Other Ambulatory Visit: Payer: Self-pay | Admitting: Student

## 2023-01-17 DIAGNOSIS — G5603 Carpal tunnel syndrome, bilateral upper limbs: Secondary | ICD-10-CM | POA: Diagnosis not present

## 2023-01-17 DIAGNOSIS — M5481 Occipital neuralgia: Secondary | ICD-10-CM | POA: Diagnosis not present

## 2023-01-29 DIAGNOSIS — Z419 Encounter for procedure for purposes other than remedying health state, unspecified: Secondary | ICD-10-CM | POA: Diagnosis not present

## 2023-02-03 DIAGNOSIS — M13841 Other specified arthritis, right hand: Secondary | ICD-10-CM | POA: Diagnosis not present

## 2023-02-03 DIAGNOSIS — G5603 Carpal tunnel syndrome, bilateral upper limbs: Secondary | ICD-10-CM | POA: Insufficient documentation

## 2023-02-04 ENCOUNTER — Encounter: Payer: Self-pay | Admitting: Student

## 2023-02-12 ENCOUNTER — Other Ambulatory Visit: Payer: Self-pay | Admitting: Student

## 2023-02-15 ENCOUNTER — Ambulatory Visit (INDEPENDENT_AMBULATORY_CARE_PROVIDER_SITE_OTHER): Payer: 59 | Admitting: Family Medicine

## 2023-02-15 VITALS — BP 115/70 | HR 65 | Temp 98.5°F | Ht 73.0 in | Wt 230.8 lb

## 2023-02-15 DIAGNOSIS — F324 Major depressive disorder, single episode, in partial remission: Secondary | ICD-10-CM

## 2023-02-15 DIAGNOSIS — R6889 Other general symptoms and signs: Secondary | ICD-10-CM | POA: Diagnosis not present

## 2023-02-15 MED ORDER — PREDNISONE 10 MG (21) PO TBPK: ORAL_TABLET | ORAL | 0 refills | Status: DC

## 2023-02-15 MED ORDER — SERTRALINE HCL 100 MG PO TABS: 50.0000 mg | ORAL_TABLET | Freq: Every day | ORAL | 0 refills | Status: DC

## 2023-02-15 NOTE — Progress Notes (Signed)
    SUBJECTIVE:   CHIEF COMPLAINT / HPI:   Rash  Patient reports breaking out in rash for the last 2 weeks.  Says that despite him taking his cetirizine and Pepcid twice daily started to have small areas of erythematous papules that increased.  Reports this has happened multiple times since he first developed Lyme in 2019.  He has taken antibiotics for this multiple times in the past and then more recently was started on antihistamines for his rash.  Antihistamines had worked however venlafaxine and montelukast are no longer effective for him.  Says if he misses even 1 dose of this medication he will break out.  The antihistamines have made him very sleepy.  Denies any fever or pruritus but says that does feel edematous and painful.  He has been to allergy into rheumatology multiple times.  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  He is having an orthopedic surgery for this on the 30th.  He is also had cardiac manifestations of Lyme in the past.  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 Legs, and trunk. No palmar or solar involvement. No mucosal involvement.        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 Current outbreak resistant to anti histamines.  -     predniSONE; Please take medication  as indicated by packet.  Dispense: 1 each; Refill: 0  Major depressive disorder in partial remission, unspecified whether recurrent (HCC) Controlled, refill  -     Sertraline HCl; Take 0.5 tablets (50 mg total) by mouth daily.  Dispense: 30 tablet; Refill: 0      Lockie Mola, MD [facility]

## 2023-02-15 NOTE — Patient Instructions (Signed)
It was wonderful to see you today.  Please bring ALL of your medications with you to every visit.   Today we talked about:  Rash-please take the steroid pocket that I prescribed.  Please continue your Pepcid while you are on this medication.  I will call Dr. Dimple Casey and see if they can pursue a biologic medication to help suppress the immune system as that is probably what is causing these chronic symptoms from Lyme.  Please follow up in 2 to 3 weeks with your PCP  Thank you for choosing [facility].   Please call [phone] with any questions about today's appointment.  Please be sure to schedule follow up at the front desk before you leave today.   Lockie Mola, MD  Family Medicine

## 2023-02-17 ENCOUNTER — Encounter: Payer: Self-pay | Admitting: Family Medicine

## 2023-02-24 ENCOUNTER — Emergency Department (HOSPITAL_COMMUNITY)
Admission: EM | Admit: 2023-02-24 | Discharge: 2023-02-24 | Disposition: A | Discharge status: Home / Self Care | Payer: 59 | Attending: Emergency Medicine | Admitting: Emergency Medicine

## 2023-02-24 ENCOUNTER — Emergency Department (HOSPITAL_COMMUNITY): Payer: 59

## 2023-02-24 ENCOUNTER — Encounter (HOSPITAL_COMMUNITY): Payer: Self-pay | Admitting: Emergency Medicine

## 2023-02-24 ENCOUNTER — Other Ambulatory Visit: Payer: Self-pay

## 2023-02-24 DIAGNOSIS — I10 Essential (primary) hypertension: Secondary | ICD-10-CM | POA: Diagnosis not present

## 2023-02-24 DIAGNOSIS — M25551 Pain in right hip: Secondary | ICD-10-CM | POA: Diagnosis not present

## 2023-02-24 DIAGNOSIS — W1830XA Fall on same level, unspecified, initial encounter: Secondary | ICD-10-CM | POA: Diagnosis not present

## 2023-02-24 DIAGNOSIS — Z981 Arthrodesis status: Secondary | ICD-10-CM | POA: Diagnosis not present

## 2023-02-24 MED ORDER — DIAZEPAM 5 MG PO TABS
5.0000 mg | ORAL_TABLET | Freq: Once | ORAL | Status: AC
  Administered 2023-02-24: 5 mg via ORAL
  Filled 2023-02-24: qty 1

## 2023-02-24 MED ORDER — METHOCARBAMOL 500 MG PO TABS: 500.0000 mg | ORAL_TABLET | Freq: Two times a day (BID) | ORAL | 0 refills | Status: DC

## 2023-02-24 MED ORDER — HYDROMORPHONE HCL 1 MG/ML IJ SOLN
1.0000 mg | Freq: Once | INTRAMUSCULAR | Status: AC
  Administered 2023-02-24: 1 mg via INTRAVENOUS
  Filled 2023-02-24: qty 1

## 2023-02-24 MED ORDER — OXYCODONE-ACETAMINOPHEN 5-325 MG PO TABS
1.0000 | ORAL_TABLET | Freq: Once | ORAL | Status: AC
  Administered 2023-02-24: 1 via ORAL
  Filled 2023-02-24: qty 1

## 2023-02-24 MED ORDER — PREDNISONE 10 MG PO TABS: ORAL_TABLET | ORAL | 0 refills | Status: AC

## 2023-02-24 NOTE — ED Notes (Signed)
Patient transported to CT 

## 2023-02-24 NOTE — ED Triage Notes (Signed)
Pt reports fall a couple of days ago. Pt reports pain to right hip. Pt is able to walk but very painful since fall. VSS.

## 2023-02-24 NOTE — ED Notes (Signed)
Pt ambulatory to bathroom with slow but steady gait

## 2023-02-24 NOTE — ED Notes (Signed)
Patient transported to X-ray 

## 2023-02-24 NOTE — ED Provider Notes (Signed)
Patient was initially seen by Dr. Jacqulyn Bath.  Please see his note.  Plain films do not show any signs of acute fracture.  With his persistent pain will CT to rule out any occult fracture not noted on plain films  CT scan without acute findings   Linwood Dibbles, MD 02/24/23 [phone]

## 2023-02-24 NOTE — Discharge Instructions (Signed)
Take the steroids and muscle relaxant in addition to your pain medications.  Follow-up with your spine doctor next week to be rechecked
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
## 2023-02-27 DIAGNOSIS — M1811 Unilateral primary osteoarthritis of first carpometacarpal joint, right hand: Secondary | ICD-10-CM | POA: Diagnosis not present

## 2023-02-27 DIAGNOSIS — G5601 Carpal tunnel syndrome, right upper limb: Secondary | ICD-10-CM | POA: Diagnosis not present

## 2023-02-27 DIAGNOSIS — G8918 Other acute postprocedural pain: Secondary | ICD-10-CM | POA: Diagnosis not present

## 2023-02-28 DIAGNOSIS — Z419 Encounter for procedure for purposes other than remedying health state, unspecified: Secondary | ICD-10-CM | POA: Diagnosis not present

## 2023-03-02 DIAGNOSIS — G5601 Carpal tunnel syndrome, right upper limb: Secondary | ICD-10-CM | POA: Diagnosis not present

## 2023-03-14 ENCOUNTER — Other Ambulatory Visit: Payer: Self-pay | Admitting: Student

## 2023-03-14 DIAGNOSIS — M79641 Pain in right hand: Secondary | ICD-10-CM

## 2023-03-16 DIAGNOSIS — M13841 Other specified arthritis, right hand: Secondary | ICD-10-CM | POA: Diagnosis not present

## 2023-03-23 ENCOUNTER — Ambulatory Visit (INDEPENDENT_AMBULATORY_CARE_PROVIDER_SITE_OTHER): Payer: 59 | Admitting: Family Medicine

## 2023-03-23 ENCOUNTER — Other Ambulatory Visit: Payer: Self-pay

## 2023-03-23 VITALS — BP 123/74 | HR 74 | Ht 72.0 in | Wt 238.6 lb

## 2023-03-23 DIAGNOSIS — G4700 Insomnia, unspecified: Secondary | ICD-10-CM | POA: Diagnosis not present

## 2023-03-23 DIAGNOSIS — R4589 Other symptoms and signs involving emotional state: Secondary | ICD-10-CM

## 2023-03-23 MED ORDER — SERTRALINE HCL 100 MG PO TABS: 100.0000 mg | ORAL_TABLET | Freq: Every day | ORAL | Status: DC

## 2023-03-23 NOTE — Progress Notes (Deleted)
    SUBJECTIVE:   CHIEF COMPLAINT / HPI:   ***  Trouble sleeping  - Sertraline? - Snoring?  PERTINENT  PMH / PSH: ***  OBJECTIVE:   There were no vitals taken for this visit.  ***  ASSESSMENT/PLAN:   No problem-specific Assessment & Plan notes found for this encounter.     Lincoln Brigham, MD [facility]

## 2023-03-23 NOTE — Assessment & Plan Note (Addendum)
Pt denies recent medication changes. Does report increased life stress. Has good sleep hygiene habits except for cigarette smoking. Discussed options with pt including continuing melatonin, increasing sertraline, or both. Pt would prefer to do both at this time. - Continue taking melatonin gummies 30 minutes before bed - Increase sertraline as below - Stop smoking 2 hours prior to sleeping

## 2023-03-23 NOTE — Progress Notes (Signed)
    SUBJECTIVE:   CHIEF COMPLAINT / HPI:   [name] is a 52 y.o. male who presents today for trouble sleeping.  Pt reports trouble sleeping for the past 4 nights (starting Sun 10/20). Reports he'll fall asleep without issue, but will wake up around an hour later. He'll then take 30-ish minutes, sometimes have a cigarette, try falling asleep again, and then will wake up again. He reports getting around 3 hours of sleep a night. Also reports weird dreams in the first hour of sleep.  He keeps his room dark and cool. He does not use his phone right before bed; he reports he does watch TV until around 30 minutes prior to sleeping. He does not drink coffee; only tea; no caffeinated soda. He reports prior sleep study negative for OSA; no snoring, no apneic episodes. He reports he has tried melatonin without improvement. Otherwise has not started any new medications/had any medication changes. He does report recent increased life stress.  He reports one time prior of having significant sleep loss previously with Lyme Disease where he felt like he was suffocating. Reported a rash had arisen from Lyme Disease had occurred right before symptoms started. Pt does not think current symptoms related to his Lyme Disease.  PERTINENT  PMH / PSH: Chronic Lyme Disease, tobacco use  OBJECTIVE:   BP 123/74   Pulse 74   Ht 6' (1.829 m)   Wt 238 lb 9.6 oz (108.2 kg)   SpO2 98%   BMI 32.36 kg/m   General: Pt is seated on chair, no acute distress. Cardiovascular: RRR, no murmurs, rubs, gallops. Pulmonary: Normal work of breathing. Lungs clear to auscultation bilaterally. Neuro/Psych: Alert and oriented to person, place, event, time. Normal affect.  PHQ9 (10/24): 14; negative Q9 GAD7 (10/24): 5  ASSESSMENT/PLAN:   Insomnia Pt denies recent medication changes. Does report increased life stress. Has good sleep hygiene habits except for cigarette smoking. Discussed options with pt including  continuing melatonin, increasing sertraline, or both. Pt would prefer to do both at this time. - Continue taking melatonin gummies 30 minutes before bed - Increase sertraline as below - Stop smoking 2 hours prior to sleeping  Depressed mood Previously controlled on sertraline 50mg , but now having more life stressors. Agreeable to increasing sertraline to 100mg  daily. - Increase Sertraline to 100mg  daily - f/u in 1 month to evaluate mood   Governor Rooks, Medical Student [facility]   I was personally present and performed or re-performed the history, physical exam and medical decision making activities of this service and have verified that the service and findings are accurately documented in the student's note.  Lincoln Brigham, MD                  03/24/2023, 6:17 PM

## 2023-03-23 NOTE — Patient Instructions (Signed)
Good to see you today - Thank you for coming in  Things we discussed today:  1) For your insomnia - Increase your Sertraline to 100mg  daily (1 full tablet) - Take melatonin 30 minutes before bedtime - Stop smoking cigarettes 2 hours before bedtime - Come back in 1 month for follow-up  If you are still not getting much sleep in 1 week, feel free to come back sooner for further evaluation.

## 2023-03-24 NOTE — Assessment & Plan Note (Addendum)
Previously controlled on sertraline 50mg , but now having more life stressors. Agreeable to increasing sertraline to 100mg  daily. - Increase Sertraline to 100mg  daily - f/u in 1 month to evaluate mood

## 2023-03-31 DIAGNOSIS — Z419 Encounter for procedure for purposes other than remedying health state, unspecified: Secondary | ICD-10-CM | POA: Diagnosis not present

## 2023-04-03 ENCOUNTER — Telehealth: Payer: Self-pay

## 2023-04-03 NOTE — Telephone Encounter (Signed)
Transition Care Management Follow-up Telephone Call Date of discharge and from where: [name] 9/27 How have you been since you were released from the hospital? Pt has been following up with Ortho since his surgery Any questions or concerns? No  Items Reviewed: Did the pt receive and understand the discharge instructions provided? Yes  Medications obtained and verified? Yes  Other? No  Any new allergies since your discharge? No  Dietary orders reviewed? No Do you have support at home? Yes     Follow up appointments reviewed:  PCP Hospital f/u appt confirmed? No  Scheduled to see  on  @ . Specialist Hospital f/u appt confirmed? Yes  Scheduled to see  on  @ . Are transportation arrangements needed? No  If their condition worsens, is the pt aware to call PCP or go to the Emergency Dept.? Yes Was the patient provided with contact information for the PCP's office or ED? Yes Was to pt encouraged to call back with questions or concerns? Yes

## 2023-04-03 NOTE — Telephone Encounter (Signed)
Transition Care Management Unsuccessful Follow-up Telephone Call  Date of discharge and from where:  [name] 9/27  Attempts:  1st Attempt  Reason for unsuccessful TCM follow-up call:  No answer/busy   [name] [place]  [facility], [facility] Guide, Phone: [phone] Website: [email or web address]

## 2023-04-13 DIAGNOSIS — M79644 Pain in right finger(s): Secondary | ICD-10-CM | POA: Diagnosis not present

## 2023-04-13 DIAGNOSIS — M13841 Other specified arthritis, right hand: Secondary | ICD-10-CM | POA: Diagnosis not present

## 2023-04-17 DIAGNOSIS — M79644 Pain in right finger(s): Secondary | ICD-10-CM | POA: Diagnosis not present

## 2023-04-30 DIAGNOSIS — Z419 Encounter for procedure for purposes other than remedying health state, unspecified: Secondary | ICD-10-CM | POA: Diagnosis not present

## 2023-05-05 DIAGNOSIS — M79644 Pain in right finger(s): Secondary | ICD-10-CM | POA: Diagnosis not present

## 2023-05-11 DIAGNOSIS — M79644 Pain in right finger(s): Secondary | ICD-10-CM | POA: Diagnosis not present

## 2023-05-11 DIAGNOSIS — M13841 Other specified arthritis, right hand: Secondary | ICD-10-CM | POA: Diagnosis not present

## 2023-05-18 DIAGNOSIS — M5481 Occipital neuralgia: Secondary | ICD-10-CM | POA: Diagnosis not present

## 2023-05-18 DIAGNOSIS — G894 Chronic pain syndrome: Secondary | ICD-10-CM | POA: Diagnosis not present

## 2023-05-18 DIAGNOSIS — M5010 Cervical disc disorder with radiculopathy, unspecified cervical region: Secondary | ICD-10-CM | POA: Diagnosis not present

## 2023-05-18 DIAGNOSIS — A6920 Lyme disease, unspecified: Secondary | ICD-10-CM | POA: Diagnosis not present

## 2023-05-18 DIAGNOSIS — Z79899 Other long term (current) drug therapy: Secondary | ICD-10-CM | POA: Diagnosis not present

## 2023-05-18 DIAGNOSIS — M961 Postlaminectomy syndrome, not elsewhere classified: Secondary | ICD-10-CM | POA: Diagnosis not present

## 2023-05-19 DIAGNOSIS — M79644 Pain in right finger(s): Secondary | ICD-10-CM | POA: Diagnosis not present

## 2023-05-25 DIAGNOSIS — M79644 Pain in right finger(s): Secondary | ICD-10-CM | POA: Diagnosis not present

## 2023-05-29 DIAGNOSIS — M79644 Pain in right finger(s): Secondary | ICD-10-CM | POA: Diagnosis not present

## 2023-05-31 DIAGNOSIS — Z419 Encounter for procedure for purposes other than remedying health state, unspecified: Secondary | ICD-10-CM | POA: Diagnosis not present

## 2023-06-21 ENCOUNTER — Other Ambulatory Visit: Payer: Self-pay | Admitting: Student

## 2023-06-21 ENCOUNTER — Encounter: Payer: Self-pay | Admitting: Student

## 2023-06-21 DIAGNOSIS — M79641 Pain in right hand: Secondary | ICD-10-CM

## 2023-06-23 ENCOUNTER — Ambulatory Visit (INDEPENDENT_AMBULATORY_CARE_PROVIDER_SITE_OTHER): Payer: Medicaid Other | Admitting: Student

## 2023-06-23 ENCOUNTER — Encounter: Payer: Self-pay | Admitting: Student

## 2023-06-23 VITALS — BP 125/80 | HR 87 | Ht 72.0 in | Wt 243.0 lb

## 2023-06-23 DIAGNOSIS — I10 Essential (primary) hypertension: Secondary | ICD-10-CM | POA: Diagnosis not present

## 2023-06-23 DIAGNOSIS — S93492D Sprain of other ligament of left ankle, subsequent encounter: Secondary | ICD-10-CM

## 2023-06-23 DIAGNOSIS — R21 Rash and other nonspecific skin eruption: Secondary | ICD-10-CM | POA: Diagnosis not present

## 2023-06-23 MED ORDER — TRIAMCINOLONE ACETONIDE 0.1 % EX OINT: 1.0000 | TOPICAL_OINTMENT | Freq: Two times a day (BID) | CUTANEOUS | 0 refills | Status: DC

## 2023-06-23 NOTE — Assessment & Plan Note (Addendum)
Patient comes in with a rash that has had on his lower extremities for 2 weeks.  Rash is pruritic, does not appear to be spreading, small erythematous papules, and dry skin appreciated.  Suspect this is related to dry skin causing dermatitis.  Low concern for infection, given appearance.  Also considered fleabites however patient has dogs that do not have fleas, and reports no one else in the house has the rash.  Will recommend treatment for dermatitis with mild potency steroid, and increasing moisturizing. - Triamcinolone 0.1% twice daily - Vaseline twice daily

## 2023-06-23 NOTE — Assessment & Plan Note (Addendum)
Patient comes in with continued pain in his left ankle, appreciates no new injury, but has been using his leg a lot, for balance, and shifting his weight onto his left leg more frequently.  Patient appreciates more pain, at end of days.  Patient appreciated that ankle had gotten better for time, but is now bothering him again.  Ankle tender to palpation over ATFL, good range of motion.  Will recommend patient follow-up with [facility]. - Follow-up [facility]

## 2023-06-23 NOTE — Patient Instructions (Signed)
It was great to see you! Thank you for allowing me to participate in your care!  I recommend that you always bring your medications to each appointment as this makes it easy to ensure we are on the correct medications and helps [place] not miss when refills are needed.  Our plans for today:  - Rash  Looks like a dermatitis, likely related to your skin being dry and irritated.    Use the Kenalog ointment twice a day, for 3-4 days   Use Vaseline twice a day, for moisturizing.   - Ankle Pain  I think the ankle ligaments are being stressed/over worked and causing you pain, but there could be a larger issue or even tear. I recommend you go to [facility] to be seen about how best to rehab and considering imagine.   We are checking some labs today, I will call you if they are abnormal will send you a MyChart message or a letter if they are normal.  If you do not hear about your labs in the next 2 weeks please let us know.  Take care and seek immediate care sooner if you develop any concerns.   Dr. Bess Kinds, MD [facility] Medicine

## 2023-06-23 NOTE — Progress Notes (Signed)
  SUBJECTIVE:   CHIEF COMPLAINT / HPI:   Rash on Ankles Rash for 2 weeks, itching, staying in same area. No new detergents or body washes. No one else in house has the rash. Has dogs, but they do not have fleas.   Ankle Pain  Been bothering him since he sprained it. Had gotten better, but now hurts him again. Worse with activity and at end of the day. Able to walk.   PERTINENT  PMH / PSH:   OBJECTIVE:  BP 125/80   Pulse 87   Ht 6' (1.829 m)   Wt 243 lb (110.2 kg)   SpO2 98%   BMI 32.96 kg/m  Physical Exam Musculoskeletal:     Right ankle: Deformity present. No tenderness. Decreased range of motion.     Left ankle: No swelling or deformity. Tenderness present over the ATF ligament. Normal range of motion. Anterior drawer test negative.  Skin:    Findings: Erythema and rash present. Rash is papular and purpuric.         ASSESSMENT/PLAN:   Assessment & Plan Rash Patient comes in with a rash that has had on his lower extremities for 2 weeks.  Rash is pruritic, does not appear to be spreading, small erythematous papules, and dry skin appreciated.  Suspect this is related to dry skin causing dermatitis.  Low concern for infection, given appearance.  Also considered fleabites however patient has dogs that do not have fleas, and reports no one else in the house has the rash.  Will recommend treatment for dermatitis with mild potency steroid, and increasing moisturizing. - Triamcinolone 0.1% twice daily - Vaseline twice daily Sprain of anterior talofibular ligament of left ankle, subsequent encounter Patient comes in with continued pain in his left ankle, appreciates no new injury, but has been using his leg a lot, for balance, and shifting his weight onto his left leg more frequently.  Patient appreciates more pain, at end of days.  Patient appreciated that ankle had gotten better for time, but is now bothering him again.  Ankle tender to palpation over ATFL, good range of motion.   Will recommend patient follow-up with [facility]. - Follow-up [facility] No follow-ups on file. Bess Kinds, MD 06/23/2023, 2:33 PM PGY-3, [facility]

## 2023-06-24 ENCOUNTER — Encounter: Payer: Self-pay | Admitting: Student

## 2023-06-24 LAB — BASIC METABOLIC PANEL
BUN/Creatinine Ratio: 14 (ref 9–20)
BUN: 14 mg/dL (ref 6–24)
CO2: 26 mmol/L (ref 20–29)
Calcium: 10 mg/dL (ref 8.7–10.2)
Chloride: 103 mmol/L (ref 96–106)
Creatinine, Ser: 1.01 mg/dL (ref 0.76–1.27)
Glucose: 86 mg/dL (ref 70–99)
Potassium: 5.1 mmol/L (ref 3.5–5.2)
Sodium: 142 mmol/L (ref 134–144)
eGFR: 90 mL/min/{1.73_m2} (ref >59)

## 2023-06-27 DIAGNOSIS — M13841 Other specified arthritis, right hand: Secondary | ICD-10-CM | POA: Diagnosis not present

## 2023-06-29 DIAGNOSIS — M79644 Pain in right finger(s): Secondary | ICD-10-CM | POA: Diagnosis not present

## 2023-07-01 DIAGNOSIS — Z419 Encounter for procedure for purposes other than remedying health state, unspecified: Secondary | ICD-10-CM | POA: Diagnosis not present

## 2023-07-01 DIAGNOSIS — M5481 Occipital neuralgia: Secondary | ICD-10-CM | POA: Diagnosis not present

## 2023-07-06 DIAGNOSIS — M79644 Pain in right finger(s): Secondary | ICD-10-CM | POA: Diagnosis not present

## 2023-07-14 ENCOUNTER — Other Ambulatory Visit: Payer: Self-pay | Admitting: Student

## 2023-07-14 DIAGNOSIS — M79644 Pain in right finger(s): Secondary | ICD-10-CM | POA: Diagnosis not present

## 2023-07-21 DIAGNOSIS — M79644 Pain in right finger(s): Secondary | ICD-10-CM | POA: Diagnosis not present

## 2023-07-23 ENCOUNTER — Encounter: Payer: Self-pay | Admitting: Student

## 2023-07-26 ENCOUNTER — Other Ambulatory Visit: Payer: Self-pay | Admitting: Cardiovascular Disease

## 2023-07-26 DIAGNOSIS — I10 Essential (primary) hypertension: Secondary | ICD-10-CM

## 2023-07-29 DIAGNOSIS — Z419 Encounter for procedure for purposes other than remedying health state, unspecified: Secondary | ICD-10-CM | POA: Diagnosis not present

## 2023-08-01 DIAGNOSIS — M13841 Other specified arthritis, right hand: Secondary | ICD-10-CM | POA: Diagnosis not present

## 2023-08-02 DIAGNOSIS — M79644 Pain in right finger(s): Secondary | ICD-10-CM | POA: Diagnosis not present

## 2023-08-10 DIAGNOSIS — M79644 Pain in right finger(s): Secondary | ICD-10-CM | POA: Diagnosis not present

## 2023-08-15 DIAGNOSIS — Z981 Arthrodesis status: Secondary | ICD-10-CM | POA: Diagnosis not present

## 2023-08-17 DIAGNOSIS — M79644 Pain in right finger(s): Secondary | ICD-10-CM | POA: Diagnosis not present

## 2023-08-18 ENCOUNTER — Other Ambulatory Visit: Payer: Self-pay | Admitting: Cardiovascular Disease

## 2023-08-18 DIAGNOSIS — I10 Essential (primary) hypertension: Secondary | ICD-10-CM

## 2023-08-21 ENCOUNTER — Encounter: Payer: Self-pay | Admitting: Student

## 2023-08-22 ENCOUNTER — Ambulatory Visit: Admitting: Neurology

## 2023-08-22 ENCOUNTER — Encounter: Payer: Self-pay | Admitting: Neurology

## 2023-08-22 VITALS — BP 124/80 | HR 88 | Ht 72.0 in | Wt 250.0 lb

## 2023-08-22 DIAGNOSIS — M79644 Pain in right finger(s): Secondary | ICD-10-CM | POA: Diagnosis not present

## 2023-08-22 DIAGNOSIS — G43709 Chronic migraine without aura, not intractable, without status migrainosus: Secondary | ICD-10-CM | POA: Diagnosis not present

## 2023-08-22 DIAGNOSIS — R29810 Facial weakness: Secondary | ICD-10-CM

## 2023-08-22 MED ORDER — SUMATRIPTAN SUCCINATE 50 MG PO TABS: 50.0000 mg | ORAL_TABLET | ORAL | 5 refills | Status: DC | PRN

## 2023-08-22 MED ORDER — ONDANSETRON 4 MG PO TBDP: 4.0000 mg | ORAL_TABLET | Freq: Three times a day (TID) | ORAL | 6 refills | Status: AC | PRN

## 2023-08-22 NOTE — Progress Notes (Signed)
 Chief Complaint  Patient presents with   New Patient (Initial Visit)    Pt in 15, here with fiance Amy  Pt is referred for possible bells palsy. Pt states he feels a pulling sensation on left side of neck. Pt states he is also having some brain fog since this started 2 weeks ago       ASSESSMENT AND PLAN  [name] is a 53 y.o. 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   MEDICAL HISTORY:  [name], is a 53 year old male seen in request by Dr. Shon Baton, Debria Garret, for evaluation of left facial weakness, chronic migraine, primary care physician is Dr. Bess Kinds, initial evaluation with his fiance Amy on August 22, 2023  History is obtained from the patient and review of electronic medical records. I personally reviewed pertinent available imaging films in PACS.   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 He returned about 10 days later and received another 10-day course. His symptoms persisted, and he was hospitalized to [facility] from 01/20/18 - 01/23/18 with skin rash, and decision was made to administer a 4-week course with IV ceftriaxone for suspected early disseminated Lyme disease, which he completed on 02/18/18. His serology for Lyme disease was negative.   He complains of multiple  joints pain, shoulder pain, MRI did show labral tear of left shoulder, also was diagnosed with chronic fatigue syndrome, intractable headache, disabled since then  End of February 2025, he noticed left face achy pain, as if somebody hit his face by a fist, swollen sensation, muscle spasm, difficulty blinking, but upon observation, he has variable effort, without attention, he has full motor strength on the left upper and lower face,  In addition he complains 3 weeks history of memory trouble, difficulty sleeping, dizziness,  He also complains of daily migraine headache for few weeks, before this he was having migraine couple times each week  PHYSICAL EXAM:   Vitals:   08/22/23 1337  BP: 124/80  Pulse: 88  Weight: 250 lb (113.4 kg)  Height: 6' (1.829 m)   Not recorded     Body mass index is 33.91 kg/m.  PHYSICAL EXAMNIATION:  Gen: NAD, conversant, well nourised, well groomed                     Cardiovascular: Regular rate rhythm, no peripheral edema, warm, nontender. Eyes: Conjunctivae clear without exudates or hemorrhage Neck: Supple, no carotid bruits. Pulmonary: Clear to auscultation bilaterally   NEUROLOGICAL EXAM:  MENTAL STATUS: Speech/cognition: Awake, alert, oriented to history taking and casual conversation CRANIAL NERVES: CN II: Visual fields are full to confrontation. Pupils are round equal and briskly reactive to light. CN Chen, IV, VI: extraocular movement are normal. No ptosis. CN V: Facial sensation is intact to light touch CN VII: Variable effort on examinations, no  significant left upper or lower face weakness, inconsistent on examination CN VIII: Hearing is normal to causal conversation. CN IX, X: Phonation is normal. CN XI: Head turning and shoulder shrug are intact  MOTOR: Right foot drop, variable effort on examinations,  REFLEXES: Reflexes are 2+ and symmetric at the biceps, triceps, knees, and ankles. Plantar responses are  flexor.  SENSORY: Intact to light touch, pinprick and vibratory sensation are intact in fingers and toes.  COORDINATION: There is no trunk or limb dysmetria noted.  GAIT/STANCE: Push-up to get up from seated position, cautious, dragging right foot  REVIEW OF SYSTEMS:  Full 14 system review of systems performed and notable only for as above All other review of systems were negative.   ALLERGIES: Allergies  Allergen Reactions   Pregabalin Hives, Shortness Of Breath and Other (See Comments)    **ATTEMPTING TO TAKE THIS MEDICATION AGAIN** Insomnia Mood alter   Sulfa Antibiotics Anaphylaxis and Other (See Comments)    HOME MEDICATIONS: Current Outpatient Medications  Medication Sig Dispense Refill   famotidine (PEPCID) 20 MG tablet TAKE 1 TABLET(20 MG) BY MOUTH TWICE DAILY 180 tablet 1   fluticasone (FLONASE) 50 MCG/ACT nasal spray SHAKE LIQUID AND USE 2 SPRAYS IN EACH NOSTRIL DAILY Strength: 50 MCG/ACT 16 g 6   gabapentin (NEURONTIN) 300 MG capsule TAKE 2 CAPSULES BY MOUTH TWICE A DAY 120 capsule 3   methocarbamol (ROBAXIN) 500 MG tablet Take 1 tablet (500 mg total) by mouth 2 (two) times daily. 20 tablet 0   Multiple Vitamins-Minerals (MENS MULTIVITAMIN) CHEW Chew 1 tablet by mouth daily.     propranolol ER (INDERAL LA) 80 MG 24 hr capsule TAKE 1 CAPSULE BY MOUTH EVERY DAY 90 capsule 2   rosuvastatin (CRESTOR) 20 MG tablet TAKE 1 TABLET(20 MG) BY MOUTH DAILY Strength: 20 mg 90 tablet 3   sertraline (ZOLOFT) 100 MG tablet Take 1 tablet (100 mg total) by mouth daily.     triamcinolone ointment (KENALOG) 0.1 % Apply 1 Application topically 2 (two) times daily. 60 g 0   valsartan (DIOVAN) 40 MG tablet TAKE 1 TABLET BY MOUTH EVERY DAY 30 tablet 0   No current facility-administered medications for this visit.    PAST MEDICAL HISTORY: Past Medical History:  Diagnosis Date   Angio-edema    Anxiety    Disseminated Lyme disease    Dystonia    Foot drop, right 2000   Headache     Hypertension 2019   Lyme disease    Neck pain    [place]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    Urticaria     PAST SURGICAL HISTORY: Past Surgical History:  Procedure Laterality Date   ADENOIDECTOMY     ANTERIOR CERVICAL DECOMPRESSION/DISCECTOMY FUSION 4 LEVELS N/A 01/20/2021   Procedure: ANTERIOR CERVICAL DECOMPRESSION/DISCECTOMY FUSION 3  LEVELS C5-T1;  Surgeon: Venita Lick, MD;  Location: [facility] OR;  Service: Orthopedics;  Laterality: N/A;   BACK SURGERY     SACROILIAC JOINT FUSION Right 11/15/2017   Procedure: SACROILIAC JOINT FUSION;  Surgeon: Venita Lick, MD;  Location[facility] OR;  Service: Orthopedics;  Laterality: Right;  120 mins   SACROILIAC JOINT FUSION Left 07/25/2018   Procedure: SACROILIAC JOINT FUSION;  Surgeon: Venita Lick, MD;  Location: [facility] OR;  Service: Orthopedics;  Laterality: Left;  SACROILIAC JOINT FUSION   TONSILLECTOMY     53 years old    FAMILY HISTORY: Family History  Problem Relation Age of Onset   Heart disease Mother    Heart disease Father  Colon polyps Father    ALS Sister    Osteoarthritis Brother    Colon cancer Paternal Grandfather        dx at age 46   Healthy Son    Healthy Son    Esophageal cancer Neg Hx    Inflammatory bowel disease Neg Hx    Liver disease Neg Hx    Pancreatic cancer Neg Hx    Stomach cancer Neg Hx     SOCIAL HISTORY: Social History   Socioeconomic History   Marital status: Legally Separated    Spouse name: Not on file   Number of children: 2   Years of education: college   Highest education level: Associate degree: occupational, Scientist, product/process development, or vocational program  Occupational History   Occupation: currently seeking disability  Tobacco Use   Smoking status: Every Day    Current packs/day: 0.50    Average packs/day: 0.5 packs/day for 30.0 years (15.0 ttl pk-yrs)    Types: Cigarettes    Passive exposure: Past   Smokeless tobacco: Never   Tobacco comments:    08/22/2023- pt states he smokes 1/2 pack a day   Vaping Use    Vaping status: Never Used  Substance and Sexual Activity   Alcohol use: Not Currently    Comment: stopped 2018   Drug use: No   Sexual activity: Yes  Other Topics Concern   Not on file  Social History Narrative   Lives at home with his wife.   Right-handed.   Caffeine use: two cans of [facility].   Social Drivers of Health   Financial Resource Strain: Medium Risk (06/22/2023)   Overall Financial Resource Strain (CARDIA)    Difficulty of Paying Living Expenses: Somewhat hard  Food Insecurity: Food Insecurity Present (06/22/2023)   Hunger Vital Sign    Worried About Running Out of Food in the Last Year: Sometimes true    Ran Out of Food in the Last Year: Sometimes true  Transportation Needs: Unmet Transportation Needs (06/22/2023)   PRAPARE - Administrator, Civil Service (Medical): Yes    Lack of Transportation (Non-Medical): Yes  Physical Activity: Unknown (06/22/2023)   Exercise Vital Sign    Days of Exercise per Week: 0 days    Minutes of Exercise per Session: Not on file  Stress: Stress Concern Present (06/22/2023)   [employer] of Occupational Health - Occupational Stress Questionnaire    Feeling of Stress : To some extent  Social Connections: Socially Isolated (06/22/2023)   Social Connection and Isolation Panel [NHANES]    Frequency of Communication with Friends and Family: Twice a week    Frequency of Social Gatherings with Friends and Family: Never    Attends Religious Services: Never    Database administrator or Organizations: No    Attends Engineer, structural: Not on file    Marital Status: Divorced  Intimate Partner Violence: Not on file      Levert Feinstein, M.D. Ph.D.  [facility] [address], [address] [place], [place] [address] Ph: [phone] Fax: [phone]  CC:  Venita Lick, MD [address] [place],  [place] [address]  Bess Kinds, MD

## 2023-08-23 ENCOUNTER — Other Ambulatory Visit: Payer: Self-pay | Admitting: Student

## 2023-08-23 DIAGNOSIS — E7800 Pure hypercholesterolemia, unspecified: Secondary | ICD-10-CM

## 2023-08-25 ENCOUNTER — Other Ambulatory Visit: Payer: Self-pay

## 2023-08-25 ENCOUNTER — Other Ambulatory Visit: Payer: Self-pay | Admitting: Cardiovascular Disease

## 2023-08-25 DIAGNOSIS — I10 Essential (primary) hypertension: Secondary | ICD-10-CM

## 2023-08-25 MED ORDER — VALSARTAN 40 MG PO TABS: 40.0000 mg | ORAL_TABLET | Freq: Every day | ORAL | 3 refills | Status: DC

## 2023-08-30 ENCOUNTER — Telehealth: Payer: Self-pay | Admitting: Neurology

## 2023-08-30 NOTE — Telephone Encounter (Signed)
 [facility]kley Harvey: [id number] exp. 08/28/23-10/27/23 sent to GI [phone]

## 2023-09-07 DIAGNOSIS — M79644 Pain in right finger(s): Secondary | ICD-10-CM | POA: Diagnosis not present

## 2023-09-08 DIAGNOSIS — M4722 Other spondylosis with radiculopathy, cervical region: Secondary | ICD-10-CM | POA: Diagnosis not present

## 2023-09-09 DIAGNOSIS — Z419 Encounter for procedure for purposes other than remedying health state, unspecified: Secondary | ICD-10-CM | POA: Diagnosis not present

## 2023-09-14 DIAGNOSIS — M79644 Pain in right finger(s): Secondary | ICD-10-CM | POA: Diagnosis not present

## 2023-09-20 DIAGNOSIS — G894 Chronic pain syndrome: Secondary | ICD-10-CM | POA: Diagnosis not present

## 2023-09-20 DIAGNOSIS — A6920 Lyme disease, unspecified: Secondary | ICD-10-CM | POA: Diagnosis not present

## 2023-09-20 DIAGNOSIS — M7918 Myalgia, other site: Secondary | ICD-10-CM | POA: Diagnosis not present

## 2023-09-20 DIAGNOSIS — Z79899 Other long term (current) drug therapy: Secondary | ICD-10-CM | POA: Diagnosis not present

## 2023-09-20 DIAGNOSIS — M79644 Pain in right finger(s): Secondary | ICD-10-CM | POA: Diagnosis not present

## 2023-09-20 DIAGNOSIS — M5481 Occipital neuralgia: Secondary | ICD-10-CM | POA: Diagnosis not present

## 2023-09-20 DIAGNOSIS — M5412 Radiculopathy, cervical region: Secondary | ICD-10-CM | POA: Diagnosis not present

## 2023-09-20 DIAGNOSIS — M961 Postlaminectomy syndrome, not elsewhere classified: Secondary | ICD-10-CM | POA: Diagnosis not present

## 2023-09-20 DIAGNOSIS — M5030 Other cervical disc degeneration, unspecified cervical region: Secondary | ICD-10-CM | POA: Diagnosis not present

## 2023-09-26 ENCOUNTER — Other Ambulatory Visit: Payer: Self-pay | Admitting: Student

## 2023-10-09 DIAGNOSIS — Z419 Encounter for procedure for purposes other than remedying health state, unspecified: Secondary | ICD-10-CM | POA: Diagnosis not present

## 2023-10-24 ENCOUNTER — Other Ambulatory Visit: Payer: Self-pay | Admitting: Student

## 2023-10-24 DIAGNOSIS — M79641 Pain in right hand: Secondary | ICD-10-CM

## 2023-11-02 ENCOUNTER — Encounter: Payer: Self-pay | Admitting: Student

## 2023-11-02 DIAGNOSIS — M1811 Unilateral primary osteoarthritis of first carpometacarpal joint, right hand: Secondary | ICD-10-CM | POA: Diagnosis not present

## 2023-11-02 DIAGNOSIS — M79645 Pain in left finger(s): Secondary | ICD-10-CM | POA: Diagnosis not present

## 2023-11-06 ENCOUNTER — Ambulatory Visit (INDEPENDENT_AMBULATORY_CARE_PROVIDER_SITE_OTHER): Admitting: Student

## 2023-11-06 ENCOUNTER — Encounter: Payer: Self-pay | Admitting: Student

## 2023-11-06 VITALS — BP 114/72 | HR 74 | Ht 72.0 in | Wt 246.8 lb

## 2023-11-06 DIAGNOSIS — R5383 Other fatigue: Secondary | ICD-10-CM

## 2023-11-06 NOTE — Patient Instructions (Addendum)
 It was great to see you! Thank you for allowing me to participate in your care!  I recommend that you always bring your medications to each appointment as this makes it easy to ensure we are on the correct medications and helps us  not miss when refills are needed.  Our plans for today:  -Fatigue We are going to work this up from multiple avenues to see if we can figure out what's going on.    We'll collect some blood work to be sure there is no infection or vitamin deficiency that is playing a role.   Testing blood work for: CBC, TSH, Vitamin B12, Vitamin B9, HIV, Syphillis  I'm also recommending you start speaking with a therapist (can be virtual if you prefer), to help manage your mood, and see if it effects your energy.   Make follow up appointment in 1 month  We are checking some labs today, I will call you if they are abnormal will send you a MyChart message or a letter if they are normal.  If you do not hear about your labs in the next 2 weeks please let us  know.  Take care and seek immediate care sooner if you develop any concerns.   Dr. Wilhemena Harbour, MD [facility]     Therapy and Counseling Resources Most providers on this list will take Medicaid. Patients with commercial insurance or Medicare should contact their insurance company to get a list of in network providers.  [facility] (takes children) Location 1: [address] [place], [place] [address] Location 2: [address] [place], [place] [address] [phone]   [facility] (spanish speaking therapist available)(habla espanol)(take medicare and medicaid)  [address], [place], [place] [address], [place]  [email or web address] [phone]  [facility]iatry and Counseling [address] [place], [place] [address] [phone]  [facility]   [address], Suite [place], [place] [address]      [phone]  [facility] (spanish  available) [address]  [place], [place] [address] [phone]  [facility] (take [facility] and medicare) [address]  [place], [place] [address]       [phone]     [facility] (virtual only) [phone]  [facility] [address] [place], [place] [phone]  [facility]:  [address] [place] [phone]  [facility]:  [address] [place] [phone]  [facility] (under & uninsured) [address]   [place] [phone]    [email or web address]    [facility] [phone] [address]  [place]    [phone]  [facility] -[address]     Phone:  [phone]     [facility]-  [address]  [phone]   [facility] [address]      [place], [place] [phone]  [facility]: [address] [place], [place], [place]  [phone]   [facility] (Spanish therapist) [email or web address]  [address] [place]  [address]   [place] [address]    [phone]    [facility]   [address],  [place], [place]  [phone]   [facility]  [address] [place]  [phone]  [facility]  [address] [place], [place]        [phone]  Open Access/Walk In Clinic under & uninsured  [facility]  [address] [place], [place] [phone] Crisis [phone]  [facility] [address],  (Spanish)   [address] , [place]: [phone]) 8:30 - 12; 1 - 2:30  [facility] [employer],  [address] [place], [place]    [phone]):8:30 - 12; 2 - 3PM  [facility],  [address]  [place]; [phone]):   Mon - Fri 8 AM - 5 PM  [facility] [address] [place]  MWF 12:30 to 3:00 or call to schedule an appointment  [phone]  Specific Provider options Psychology Today  [email or web address]/us  click on find a therapist  enter your zip code left side and select or tailor a therapist for your specific need.   [facility] Provider Directory [email or web address]  (Medicaid)   Follow all drop down to find a provider  Social Support program [facility] [phone] or PhotoSolver.pl [address], [place], [place] Recovery support and educational   24- Hour Availability:   [facility]  [address] [place], [place] [phone] Crisis [phone]  [facility] [phone]  [facility]  [phone]   [facility]  [phone] (after hours)  Therapeutic Alternative/Mobile Crisis   [phone]  [place]  National Suicide Hotline  [phone] [phone] Derrel Flies)  Call 911 or go to emergency room  [facility]  [phone]);  [place] and [employer]  [phone]); [place], [place], [place], [place], [place], [place], [place]

## 2023-11-06 NOTE — Progress Notes (Signed)
  SUBJECTIVE:   CHIEF COMPLAINT / HPI:   Fatigue -Noting fatigue for last 2-3 weeks. Get's 6 hours of sleep but often feels tired, doesn't snore. Eat's regular meals, but has last a couple pounds. Mood has been okay, still taking Zoloft  100 mg, feels it's helping.    PERTINENT  PMH / PSH:   OBJECTIVE:  BP 114/72   Pulse 74   Ht 6' (1.829 m)   Wt 246 lb 12.8 oz (111.9 kg)   SpO2 97%   BMI 33.47 kg/m  Physical Exam Psychiatric:        Mood and Affect: Affect normal. Mood is depressed.        Behavior: Behavior normal. Behavior is not agitated or aggressive. Behavior is cooperative.        Thought Content: Thought content normal. Thought content does not include suicidal ideation. Thought content does not include suicidal plan.      ASSESSMENT/PLAN:   Assessment & Plan Other fatigue Patient comes in with complaint of fatigue for 2 to 3 weeks.  Patient appreciates he has good diet, but has lost 2 to 3 pounds since his last visit in March.  Patient appreciates mood is okay, does feel depressed.  Patient is taking sertraline  100 mg daily, feels like it is helping a lot.  Patient sleeping about 6 hours a night, sleep difficult secondary to arthritic pain in hands, and flexion of neck and flexion of pectoral on left side.  Patient mood depressed, but denies any thoughts of suicide or homicide.  Patient denies any acute illness, fevers, or systemic symptoms, making infection less likely, but will check labs. - CBC, TSH, HIV, RPR, B9, B12 - Therapy resources given - Recommend follow-up 1 month No follow-ups on file. Wilhemena Harbour, MD 11/06/2023, 2:19 PM PGY-3, [facility]

## 2023-11-06 NOTE — Assessment & Plan Note (Addendum)
 Patient comes in with complaint of fatigue for 2 to 3 weeks.  Patient appreciates he has good diet, but has lost 2 to 3 pounds since his last visit in March.  Patient appreciates mood is okay, does feel depressed.  Patient is taking sertraline  100 mg daily, feels like it is helping a lot.  Patient sleeping about 6 hours a night, sleep difficult secondary to arthritic pain in hands, and flexion of neck and flexion of pectoral on left side.  Patient mood depressed, but denies any thoughts of suicide or homicide.  Patient denies any acute illness, fevers, or systemic symptoms, making infection less likely, but will check labs. - CBC, TSH, HIV, RPR, B9, B12 - Therapy resources given - Recommend follow-up 1 month

## 2023-11-07 ENCOUNTER — Encounter: Payer: Self-pay | Admitting: *Deleted

## 2023-11-08 LAB — RPR: RPR Ser Ql: REACTIVE — AB

## 2023-11-08 LAB — VITAMIN B12: Vitamin B-12: 467 pg/mL (ref 232–1245)

## 2023-11-08 LAB — RPR, QUANT+TP ABS (REFLEX): Rapid Plasma Reagin, Quant: 1:1 {titer} — ABNORMAL HIGH

## 2023-11-08 LAB — FOLATE: Folate: 13.8 ng/mL (ref >3.0)

## 2023-11-08 LAB — HIV ANTIBODY (ROUTINE TESTING W REFLEX): HIV Screen 4th Generation wRfx: NONREACTIVE

## 2023-11-08 LAB — TSH RFX ON ABNORMAL TO FREE T4: TSH: 1.57 u[IU]/mL (ref 0.450–4.500)

## 2023-11-09 DIAGNOSIS — Z419 Encounter for procedure for purposes other than remedying health state, unspecified: Secondary | ICD-10-CM | POA: Diagnosis not present

## 2023-11-15 ENCOUNTER — Other Ambulatory Visit: Payer: Self-pay | Admitting: Student

## 2023-11-15 ENCOUNTER — Ambulatory Visit: Payer: Self-pay | Admitting: Student

## 2023-11-15 DIAGNOSIS — R5383 Other fatigue: Secondary | ICD-10-CM

## 2023-11-15 NOTE — Progress Notes (Signed)
 Pt needing retest of RPR given last result w/ positive RPR and quant 1:1. Patient made aware and plan to retest in 2 weeks

## 2023-11-16 ENCOUNTER — Other Ambulatory Visit: Payer: Self-pay | Admitting: Student

## 2023-11-16 DIAGNOSIS — I10 Essential (primary) hypertension: Secondary | ICD-10-CM

## 2023-11-16 NOTE — Anesthesia Preprocedure Evaluation (Addendum)
 Anesthesia Evaluation    Reviewed: Allergy & Precautions, Patient's Chart, lab work & pertinent test results, reviewed documented beta blocker date and time   Airway Mallampati: II  TM Distance: >3 FB Neck ROM: Full    Dental no notable dental hx.    Pulmonary Current Smoker and Patient abstained from smoking. 15 pack year history    Pulmonary exam normal breath sounds clear to auscultation       Cardiovascular hypertension, Pt. on medications and Pt. on home beta blockers (-) Past MI Normal cardiovascular exam Rhythm:Regular Rate:Normal  IMPRESSIONS     1. Left ventricular ejection fraction, by estimation, is 55 to 60%. The  left ventricle has normal function. The left ventricle has no regional  wall motion abnormalities. Left ventricular diastolic parameters were  normal.   2. Right ventricular systolic function is normal. The right ventricular  size is normal. Tricuspid regurgitation signal is inadequate for assessing  PA pressure.   3. The mitral valve is normal in structure. No evidence of mitral valve  regurgitation. No evidence of mitral stenosis.   4. The aortic valve is grossly normal. Aortic valve regurgitation is not  visualized. No aortic stenosis is present.   5. The inferior vena cava is normal in size with greater than 50%  respiratory variability, suggesting right atrial pressure of 3 mmHg.     Neuro/Psych  Headaches, neg Seizures PSYCHIATRIC DISORDERS Anxiety Depression       GI/Hepatic negative GI ROS, Neg liver ROS,,,  Endo/Other  BMI 33  Renal/GU negative Renal ROS  negative genitourinary   Musculoskeletal   Abdominal   Peds  Hematology negative hematology ROS (+)   Anesthesia Other Findings   Reproductive/Obstetrics negative OB ROS                              Anesthesia Physical Anesthesia Plan  ASA: 2  Anesthesia Plan: MAC and Regional   Post-op Pain  Management: Regional block* and Tylenol  PO (pre-op)*   Induction:   PONV Risk Score and Plan: 2 and Propofol  infusion and TIVA  Airway Management Planned: Natural Airway and Simple Face Mask  Additional Equipment: None  Intra-op Plan:   Post-operative Plan:   Informed Consent:   Plan Discussed with: CRNA  Anesthesia Plan Comments:         Anesthesia Quick Evaluation

## 2023-11-20 ENCOUNTER — Encounter (HOSPITAL_BASED_OUTPATIENT_CLINIC_OR_DEPARTMENT_OTHER): Payer: Self-pay | Admitting: Orthopedic Surgery

## 2023-11-20 ENCOUNTER — Other Ambulatory Visit: Payer: Self-pay

## 2023-11-20 NOTE — Progress Notes (Signed)
   11/20/23 1429  PAT Phone Screen  Is the patient taking a GLP-1 receptor agonist? No  Do You Have Diabetes? No  Do You Have Hypertension? (S)  Yes  Have You Ever Been to the ER for Asthma? No  Have You Taken Oral Steroids in the Past 3 Months? No  Do you Take Phenteramine or any Other Diet Drugs? No  Recent  Lab Work, EKG, CXR? No  Do you have a history of heart problems? No  Any Recent Hospitalizations? No  Height 6' (1.829 m)  Weight 111.9 kg  Bruna Appointment Scheduled (S)  Yes (ekg)

## 2023-11-22 ENCOUNTER — Encounter (HOSPITAL_BASED_OUTPATIENT_CLINIC_OR_DEPARTMENT_OTHER)
Admission: RE | Admit: 2023-11-22 | Discharge: 2023-11-22 | Disposition: A | Discharge status: Home / Self Care | Source: Ambulatory Visit | Attending: Orthopedic Surgery | Admitting: Orthopedic Surgery

## 2023-11-22 DIAGNOSIS — I10 Essential (primary) hypertension: Secondary | ICD-10-CM | POA: Insufficient documentation

## 2023-11-22 DIAGNOSIS — Z01810 Encounter for preprocedural cardiovascular examination: Secondary | ICD-10-CM | POA: Diagnosis not present

## 2023-11-22 DIAGNOSIS — Z01818 Encounter for other preprocedural examination: Secondary | ICD-10-CM | POA: Diagnosis present

## 2023-11-24 ENCOUNTER — Encounter: Payer: Self-pay | Admitting: Student

## 2023-11-26 NOTE — H&P (Signed)
 [name] is an 53 y.o. male.   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, Pt here for surgery Pt had previously undergone right thumb cmc arthroplasty and has done well  Past Medical History:  Diagnosis Date   Angio-edema    Anxiety    Disseminated Lyme disease    Dystonia    Foot drop, right 2000   Headache    Hypertension 2019   Lyme disease    Neck pain    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    Urticaria     Past Surgical History:  Procedure Laterality Date   ADENOIDECTOMY     ANTERIOR CERVICAL DECOMPRESSION/DISCECTOMY FUSION 4 LEVELS N/A 01/20/2021   Procedure: ANTERIOR CERVICAL DECOMPRESSION/DISCECTOMY FUSION 3  LEVELS C5-T1;  Surgeon: Burnetta Aures, MD;  Location: [facility] OR;  Service: Orthopedics;  Laterality: N/A;   BACK SURGERY     SACROILIAC JOINT FUSION Right 11/15/2017   Procedure: SACROILIAC JOINT FUSION;  Surgeon: Burnetta Aures, MD;  Location: [facility] OR;  Service: Orthopedics;  Laterality: Right;  120 mins   SACROILIAC JOINT FUSION Left 07/25/2018   Procedure: SACROILIAC JOINT FUSION;  Surgeon: Burnetta Aures, MD;  Location: [facility] OR;  Service: Orthopedics;  Laterality: Left;  SACROILIAC JOINT FUSION   TONSILLECTOMY     53 years old    Family History  Problem Relation Age of Onset   Heart disease Mother    Heart disease Father    Colon polyps Father    ALS Sister    Osteoarthritis Brother    Colon cancer Paternal Grandfather        dx at age 80   Healthy Son    Healthy Son    Esophageal cancer Neg Hx    Inflammatory bowel disease Neg Hx    Liver disease Neg Hx    Pancreatic cancer Neg Hx    Stomach cancer Neg Hx    Social History:  reports that he has been smoking cigarettes. He has a 15 pack-year smoking history. He has been exposed to tobacco smoke. He has never used smokeless tobacco. He reports that he does not currently use alcohol. He reports that he does not use drugs.  Allergies:   Allergies  Allergen Reactions   Pregabalin  Hives, Shortness Of Breath and Other (See Comments)    **ATTEMPTING TO TAKE THIS MEDICATION AGAIN** Insomnia Mood alter   Sulfa Antibiotics Anaphylaxis and Other (See Comments)    No medications prior to admission.    No results found for this or any previous visit (from the past 48 hours). No results found.  ROS no recent illnesses or hospitalizationss  Height 6' (1.829 m), weight 111.9 kg. Physical Exam  General Appearance:  Alert, cooperative, no distress, appears stated age  Head:  Normocephalic, without obvious abnormality, atraumatic  Eyes:  Pupils equal, conjunctiva/corneas clear,         Throat: Lips, mucosa, and tongue normal; teeth and gums normal  Neck: No visible masses     Lungs:   respirations unlabored  Chest Wall:  No tenderness or deformity  Heart:  Regular rate and rhythm,  Abdomen:   Soft, non-tender,         Extremities: LUE: skin intact fingers warm well perfused Able to extend thumb and fingers   Pulses: 2+ and symmetric  Skin: Skin color, texture, turgor normal, no rashes or lesions     Neurologic: Normal     Assessment/Plan  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  PT VOICED UNDERSTANDING OF PLAN CONSENT SIGNED DAY OF SURGERY PT SEEN AND EXAMINED PRIOR TO OPERATIVE PROCEDURE/DAY OF SURGERY SITE MARKED. QUESTIONS ANSWERED WILL GO HOME FOLLOWING SURGERY  WE ARE PLANNING SURGERY FOR YOUR UPPER EXTREMITY. THE RISKS AND BENEFITS OF SURGERY INCLUDE BUT NOT LIMITED TO BLEEDING INFECTION, DAMAGE TO NEARBY NERVES ARTERIES TENDONS, FAILURE OF SURGERY TO ACCOMPLISH ITS INTENDED GOALS, PERSISTENT SYMPTOMS AND NEED FOR FURTHER SURGICAL INTERVENTION. WITH THIS IN MIND WE WILL PROCEED. I HAVE DISCUSSED WITH THE PATIENT THE PRE AND POSTOPERATIVE REGIMEN AND THE DOS AND DON'TS. PT VOICED UNDERSTANDING AND  INFORMED CONSENT SIGNED.   [name] 11/26/2023, 5:21 PM

## 2023-11-27 ENCOUNTER — Encounter (HOSPITAL_BASED_OUTPATIENT_CLINIC_OR_DEPARTMENT_OTHER): Payer: Self-pay | Admitting: Orthopedic Surgery

## 2023-11-27 ENCOUNTER — Ambulatory Visit (HOSPITAL_BASED_OUTPATIENT_CLINIC_OR_DEPARTMENT_OTHER)

## 2023-11-27 ENCOUNTER — Ambulatory Visit (HOSPITAL_BASED_OUTPATIENT_CLINIC_OR_DEPARTMENT_OTHER): Payer: Self-pay | Admitting: Anesthesiology

## 2023-11-27 ENCOUNTER — Other Ambulatory Visit: Payer: Self-pay

## 2023-11-27 ENCOUNTER — Encounter (HOSPITAL_BASED_OUTPATIENT_CLINIC_OR_DEPARTMENT_OTHER)
Admission: RE | Disposition: A | Discharge status: Home / Self Care | Payer: Self-pay | Source: Home / Self Care | Attending: Orthopedic Surgery

## 2023-11-27 ENCOUNTER — Ambulatory Visit (HOSPITAL_BASED_OUTPATIENT_CLINIC_OR_DEPARTMENT_OTHER)
Admission: RE | Admit: 2023-11-27 | Discharge: 2023-11-27 | Disposition: A | Discharge status: Home / Self Care | Attending: Orthopedic Surgery | Admitting: Orthopedic Surgery

## 2023-11-27 DIAGNOSIS — M1812 Unilateral primary osteoarthritis of first carpometacarpal joint, left hand: Secondary | ICD-10-CM | POA: Diagnosis not present

## 2023-11-27 DIAGNOSIS — M79645 Pain in left finger(s): Secondary | ICD-10-CM | POA: Diagnosis present

## 2023-11-27 DIAGNOSIS — E785 Hyperlipidemia, unspecified: Secondary | ICD-10-CM

## 2023-11-27 DIAGNOSIS — I10 Essential (primary) hypertension: Secondary | ICD-10-CM | POA: Insufficient documentation

## 2023-11-27 DIAGNOSIS — M19042 Primary osteoarthritis, left hand: Secondary | ICD-10-CM | POA: Insufficient documentation

## 2023-11-27 DIAGNOSIS — F418 Other specified anxiety disorders: Secondary | ICD-10-CM

## 2023-11-27 DIAGNOSIS — F17210 Nicotine dependence, cigarettes, uncomplicated: Secondary | ICD-10-CM | POA: Diagnosis not present

## 2023-11-27 HISTORY — PX: FINGER ARTHROPLASTY: SHX5017

## 2023-11-27 SURGERY — ARTHROPLASTY, FINGER
Anesthesia: Monitor Anesthesia Care | Site: Finger | Laterality: Left

## 2023-11-27 MED ORDER — ONDANSETRON HCL 4 MG/2ML IJ SOLN
INTRAMUSCULAR | Status: DC | PRN
Start: 2023-11-27 — End: 2023-11-27
  Administered 2023-11-27: 4 mg via INTRAVENOUS

## 2023-11-27 MED ORDER — OXYCODONE HCL 5 MG/5ML PO SOLN: 5.0000 mg | Freq: Once | ORAL | Status: DC | PRN

## 2023-11-27 MED ORDER — 0.9 % SODIUM CHLORIDE (POUR BTL) OPTIME
TOPICAL | Status: DC | PRN
  Administered 2023-11-27: 1000 mL

## 2023-11-27 MED ORDER — ROPIVACAINE HCL 5 MG/ML IJ SOLN
INTRAMUSCULAR | Status: DC | PRN
  Administered 2023-11-27: 25 mL via PERINEURAL

## 2023-11-27 MED ORDER — LIDOCAINE HCL (CARDIAC) PF 100 MG/5ML IV SOSY
PREFILLED_SYRINGE | INTRAVENOUS | Status: DC | PRN
  Administered 2023-11-27: 20 mg via INTRAVENOUS

## 2023-11-27 MED ORDER — DEXMEDETOMIDINE HCL IN NACL 80 MCG/20ML IV SOLN
INTRAVENOUS | Status: DC | PRN
  Administered 2023-11-27 (×2): 4 ug via INTRAVENOUS

## 2023-11-27 MED ORDER — MIDAZOLAM HCL 2 MG/2ML IJ SOLN
INTRAMUSCULAR | Status: DC | PRN
  Administered 2023-11-27 (×2): 1 mg via INTRAVENOUS

## 2023-11-27 MED ORDER — LACTATED RINGERS IV SOLN: INTRAVENOUS | Status: DC

## 2023-11-27 MED ORDER — POVIDONE-IODINE 7.5 % EX SOLN: Freq: Once | CUTANEOUS | Status: AC

## 2023-11-27 MED ORDER — PROPOFOL 500 MG/50ML IV EMUL
INTRAVENOUS | Status: DC | PRN
  Administered 2023-11-27: 100 ug/kg/min via INTRAVENOUS

## 2023-11-27 MED ORDER — AMISULPRIDE (ANTIEMETIC) 5 MG/2ML IV SOLN: 10.0000 mg | Freq: Once | INTRAVENOUS | Status: DC | PRN

## 2023-11-27 MED ORDER — PROPOFOL 1000 MG/100ML IV EMUL
INTRAVENOUS | Status: AC
  Filled 2023-11-27: qty 200

## 2023-11-27 MED ORDER — FENTANYL CITRATE (PF) 100 MCG/2ML IJ SOLN
INTRAMUSCULAR | Status: DC | PRN
  Administered 2023-11-27: 25 ug via INTRAVENOUS

## 2023-11-27 MED ORDER — BACITRACIN ZINC 500 UNIT/GM EX OINT
TOPICAL_OINTMENT | CUTANEOUS | Status: AC
  Filled 2023-11-27: qty 1.8

## 2023-11-27 MED ORDER — OXYCODONE HCL 5 MG PO TABS: 5.0000 mg | ORAL_TABLET | Freq: Once | ORAL | Status: DC | PRN

## 2023-11-27 MED ORDER — ACETAMINOPHEN 500 MG PO TABS
1000.0000 mg | ORAL_TABLET | Freq: Once | ORAL | Status: AC
  Administered 2023-11-27: 1000 mg via ORAL

## 2023-11-27 MED ORDER — MIDAZOLAM HCL 2 MG/2ML IJ SOLN
1.0000 mg | Freq: Once | INTRAMUSCULAR | Status: AC
  Administered 2023-11-27: 1 mg via INTRAVENOUS

## 2023-11-27 MED ORDER — ACETAMINOPHEN 500 MG PO TABS
ORAL_TABLET | ORAL | Status: AC
  Filled 2023-11-27: qty 2

## 2023-11-27 MED ORDER — HYDROMORPHONE HCL 1 MG/ML IJ SOLN: 0.2500 mg | INTRAMUSCULAR | Status: DC | PRN

## 2023-11-27 MED ORDER — DEXMEDETOMIDINE HCL IN NACL 80 MCG/20ML IV SOLN
INTRAVENOUS | Status: AC
  Filled 2023-11-27: qty 20

## 2023-11-27 MED ORDER — ONDANSETRON HCL 4 MG/2ML IJ SOLN: 4.0000 mg | Freq: Once | INTRAMUSCULAR | Status: DC | PRN

## 2023-11-27 MED ORDER — FENTANYL CITRATE (PF) 100 MCG/2ML IJ SOLN
INTRAMUSCULAR | Status: AC
  Filled 2023-11-27: qty 2

## 2023-11-27 MED ORDER — KETOROLAC TROMETHAMINE 30 MG/ML IJ SOLN: 30.0000 mg | Freq: Once | INTRAMUSCULAR | Status: DC | PRN

## 2023-11-27 MED ORDER — MIDAZOLAM HCL 2 MG/2ML IJ SOLN
INTRAMUSCULAR | Status: AC
Start: 2023-11-27 — End: 2023-11-27
  Filled 2023-11-27: qty 2

## 2023-11-27 MED ORDER — FENTANYL CITRATE (PF) 100 MCG/2ML IJ SOLN
100.0000 ug | Freq: Once | INTRAMUSCULAR | Status: AC
  Administered 2023-11-27: 100 ug via INTRAVENOUS

## 2023-11-27 MED ORDER — CEFAZOLIN SODIUM-DEXTROSE 2-4 GM/100ML-% IV SOLN
INTRAVENOUS | Status: AC
  Filled 2023-11-27: qty 100

## 2023-11-27 MED ORDER — CEFAZOLIN SODIUM-DEXTROSE 2-4 GM/100ML-% IV SOLN
2.0000 g | INTRAVENOUS | Status: AC
Start: 2023-11-27 — End: 2023-11-27
  Administered 2023-11-27: 2 g via INTRAVENOUS

## 2023-11-27 SURGICAL SUPPLY — 58 items
BLADE CLIPPER SURG (BLADE) ×2 IMPLANT
BLADE SURG 15 STRL LF DISP TIS (BLADE) ×3 IMPLANT
BNDG COMPR ESMARK 4X3 LF (GAUZE/BANDAGES/DRESSINGS) IMPLANT
BNDG ELASTIC 2INX 5YD STR LF (GAUZE/BANDAGES/DRESSINGS) IMPLANT
BNDG ELASTIC 3INX 5YD STR LF (GAUZE/BANDAGES/DRESSINGS) ×2 IMPLANT
BNDG GAUZE DERMACEA FLUFF 4 (GAUZE/BANDAGES/DRESSINGS) ×2 IMPLANT
CANISTER SUCT 1200ML W/VALVE (MISCELLANEOUS) ×2 IMPLANT
CORD BIPOLAR FORCEPS 12FT (ELECTRODE) ×2 IMPLANT
COVER BACK TABLE 60X90IN (DRAPES) ×2 IMPLANT
COVER MAYO STAND STRL (DRAPES) ×2 IMPLANT
CUFF TOURN SGL QUICK 18X4 (TOURNIQUET CUFF) IMPLANT
DRAPE EXTREMITY T 121X128X90 (DISPOSABLE) ×2 IMPLANT
DRAPE SURG 17X23 STRL (DRAPES) ×2 IMPLANT
DRSG EMULSION OIL 3X3 NADH (GAUZE/BANDAGES/DRESSINGS) ×2 IMPLANT
GAUZE 4X4 16PLY ~~LOC~~+RFID DBL (SPONGE) IMPLANT
GAUZE SPONGE 4X4 12PLY STRL (GAUZE/BANDAGES/DRESSINGS) IMPLANT
GLOVE BIO SURGEON STRL SZ 6.5 (GLOVE) ×1 IMPLANT
GLOVE BIO SURGEON STRL SZ8 (GLOVE) ×2 IMPLANT
GLOVE BIOGEL PI IND STRL 6.5 (GLOVE) ×2 IMPLANT
GLOVE BIOGEL PI IND STRL 8.5 (GLOVE) ×2 IMPLANT
GOWN STRL REUS W/ TWL LRG LVL3 (GOWN DISPOSABLE) ×2 IMPLANT
GOWN STRL REUS W/ TWL XL LVL3 (GOWN DISPOSABLE) ×2 IMPLANT
IMPL SWIVELOCK 3.5X13.5 (Anchor) IMPLANT
KIT ASCP FXDISP 3X8XBTNDS (KITS) IMPLANT
NDL HYPO 22X1.5 SAFETY MO (MISCELLANEOUS) IMPLANT
NDL HYPO 25X1 1.5 SAFETY (NEEDLE) ×2 IMPLANT
NEEDLE HYPO 22X1.5 SAFETY MO (MISCELLANEOUS) IMPLANT
NEEDLE HYPO 25X1 1.5 SAFETY (NEEDLE) ×1 IMPLANT
NS IRRIG 1000ML POUR BTL (IV SOLUTION) ×2 IMPLANT
PACK BASIN DAY SURGERY FS (CUSTOM PROCEDURE TRAY) ×1 IMPLANT
PAD CAST 3X4 CTTN HI CHSV (CAST SUPPLIES) ×4 IMPLANT
PADDING CAST ABS COTTON 3X4 (CAST SUPPLIES) ×2 IMPLANT
PADDING CAST ABS COTTON 4X4 ST (CAST SUPPLIES) ×2 IMPLANT
PASSER SUT SWANSON 36MM LOOP (INSTRUMENTS) IMPLANT
SHEET MEDIUM DRAPE 40X70 STRL (DRAPES) IMPLANT
SPIKE FLUID TRANSFER (MISCELLANEOUS) IMPLANT
SPLINT FIBERGLASS 3X35 (CAST SUPPLIES) IMPLANT
SPLINT FIBERGLASS 4X30 (CAST SUPPLIES) IMPLANT
SPLINT PLASTER CAST XFAST 3X15 (CAST SUPPLIES) IMPLANT
SPONGE SURGIFOAM ABS GEL 12-7 (HEMOSTASIS) IMPLANT
STOCKINETTE 4X48 STRL (DRAPES) ×1 IMPLANT
STOCKINETTE SYNTHETIC 3 UNSTER (CAST SUPPLIES) ×2 IMPLANT
STRIP CLOSURE SKIN 1/2X4 (GAUZE/BANDAGES/DRESSINGS) IMPLANT
SUCTION TUBE FRAZIER 10FR DISP (SUCTIONS) ×2 IMPLANT
SUT BONE WAX W31G (SUTURE) IMPLANT
SUT MNCRL AB 4-0 PS2 18 (SUTURE) IMPLANT
SUT PROLENE 4 0 PS 2 18 (SUTURE) ×2 IMPLANT
SUT VIC AB 2-0 PS2 27 (SUTURE) IMPLANT
SUT VIC AB 4-0 PS2 18 (SUTURE) IMPLANT
SUT VICRYL RAPIDE 4/0 PS 2 (SUTURE) IMPLANT
SUTURE FIBERWR 3-0 18 TAPR NDL (SUTURE) IMPLANT
SUTURE FIBERWR 4-0 18 TAPR NDL (SUTURE) ×2 IMPLANT
SYR BULB EAR ULCER 3OZ GRN STR (SYRINGE) ×1 IMPLANT
SYR CONTROL 10ML LL (SYRINGE) ×2 IMPLANT
TAPE SURG TRANSPORE 1 IN (GAUZE/BANDAGES/DRESSINGS) ×1 IMPLANT
TOWEL GREEN STERILE FF (TOWEL DISPOSABLE) ×4 IMPLANT
TUBE CONNECTING 20X1/4 (TUBING) ×2 IMPLANT
UNDERPAD 30X36 HEAVY ABSORB (UNDERPADS AND DIAPERS) ×2 IMPLANT

## 2023-11-27 NOTE — Anesthesia Postprocedure Evaluation (Signed)
 Anesthesia Post Note  Patient: [name]  Procedure(s) Performed: ARTHROPLASTY, FINGER (Left: Finger)     Patient location during evaluation: PACU Anesthesia Type: Regional and MAC Level of consciousness: awake and alert Pain management: pain level controlled Vital Signs Assessment: post-procedure vital signs reviewed and stable Respiratory status: spontaneous breathing, nonlabored ventilation, respiratory function stable and patient connected to nasal cannula oxygen Cardiovascular status: stable and blood pressure returned to baseline Postop Assessment: no apparent nausea or vomiting Anesthetic complications: no   No notable events documented.  Last Vitals:  Vitals:   11/27/23 0915 11/27/23 0947  BP: 130/76 110/67  Pulse: (!) 54 63  Resp: 12 20  Temp:  36.6 C  SpO2: 98% 94%    Last Pain:  Vitals:   11/27/23 0947  TempSrc: Temporal  PainSc: 0-No pain                 [name]

## 2023-11-27 NOTE — Discharge Instructions (Addendum)
 KEEP BANDAGE CLEAN AND DRY CALL OFFICE FOR F/U APPT [phone] in 15 days KEEP HAND ELEVATED ABOVE HEART OK TO APPLY ICE TO OPERATIVE AREA CONTACT OFFICE IF ANY WORSENING PAIN OR CONCERNS.    Post Anesthesia Home Care Instructions  Activity: Get plenty of rest for the remainder of the day. A responsible individual must stay with you for 24 hours following the procedure.  For the next 24 hours, DO NOT: -Drive a car -Advertising copywriter -Drink alcoholic beverages -Take any medication unless instructed by your physician -Make any legal decisions or sign important papers.  Meals: Start with liquid foods such as gelatin or soup. Progress to regular foods as tolerated. Avoid greasy, spicy, heavy foods. If nausea and/or vomiting occur, drink only clear liquids until the nausea and/or vomiting subsides. Call your physician if vomiting continues.  Special Instructions/Symptoms: Your throat may feel dry or sore from the anesthesia or the breathing tube placed in your throat during surgery. If this causes discomfort, gargle with warm salt water. The discomfort should disappear within 24 hours.  Next dose of Tylenol  can be taken today at 1pm if needed.

## 2023-11-27 NOTE — Progress Notes (Signed)
 Assisted Dr. Lyn Henri with left, supraclavicular, ultrasound guided block. Side rails up, monitors on throughout procedure. See vital signs in flow sheet. Tolerated Procedure well.

## 2023-11-27 NOTE — Anesthesia Procedure Notes (Signed)
 Anesthesia Regional Block: Supraclavicular block   Pre-Anesthetic Checklist: , timeout performed,  Correct Patient, Correct Site, Correct Laterality,  Correct Procedure, Correct Position, site marked,  Risks and benefits discussed,  Surgical consent,  Pre-op evaluation,  At surgeon's request and post-op pain management  Laterality: Left  Prep: chloraprep       Needles:  Injection technique: Single-shot  Needle Type: Echogenic Stimulator Needle     Needle Length: 9cm  Needle Gauge: 21     Additional Needles:   Procedures:,,,, ultrasound used (permanent image in chart),,    Narrative:  Start time: 11/27/2023 7:00 AM End time: 11/27/2023 7:10 AM Injection made incrementally with aspirations every 5 mL.  Performed by: Personally  Anesthesiologist: Erma Thom SAUNDERS, MD  Additional Notes: Discussed risks and benefits of the nerve block in detail, including but not limited vascular injury, permanent nerve damage and infection.   Patient tolerated the procedure well. Local anesthetic introduced in an incremental fashion under minimal resistance after negative aspirations. No paresthesias were elicited. After completion of the procedure, no acute issues were identified and patient continued to be monitored by RN.

## 2023-11-27 NOTE — Op Note (Signed)
 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      PROCEDURES PERFORMED:         Ligament reconstruction tendon interposition (Related CPT Codes: 74552)             Left Side         Tendon transplantation of forearm and/or wrist (Related CPT Codes: 74689)             Left Side             Tendon(s): Abductor pollicis tendon             Tendon Type(s): Extensor         Tendon transfer of forearm and/or wrist (Related CPT Codes: 74689)             Left Side             Tendon(s): Abductor pollicis tendon             Tendon Type(s): Extensor         Radiologic examination of wrist (Related CPT Codes: 26899, 73110)             Number of Views: 3 views     ASSISTANTS:         None     ESTIMATED BLOOD LOSS:         Per anesthesia record     FINDINGS:         See Post-Operative Diagnoses     SPECIMEN REMOVED:         None     COMPLICATIONS:         None     ANESTHESIA:         Regional     SPONGE, NEEDLE AND INSTRUMENT COUNT:         Correct X2  INDICATIONS: [name] is a right handed male with 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 Pt had failed nonsurgical measures and elected to undergo above procedure. Risks/benefits and alternatives discussed with patient and patient signed consent to proceed. Risks include but not limited to bleeding infection, nerve damage, subsidence, persistent pain and instability and need for further surgical intervention.  TECHNIQUE:          The patient was properly identified in the preoperative holding area, and a mark with a permanent marker was made on the left thumb to indicate the correct operative site. The patient was brought back to the operating room and placed supine on the operating table where regional anesthesia had been administered. Preop antibiotics had been given. The  patient tolerated this well. A well-padded tourniquet was placed on the left brachium and sealed with a drape. The left upper extremity was then prepped and draped in a normal sterile fashion. A timeout was called, the correct site was identified, and the procedure was then begun.          Attention was then turned to the thumb. A longitudinal incision was made directly over the thumb metacarpal CMC joint. Dissection was carried down through the skin and subcutaneous tissue going between the interval of the extensor pollicis brevis and abductor pollicis longus. Skin flaps with thick capsular flaps were then raised. Circumferential dissection was carried all the way down around the trapezium. Careful protection of the radial artery  was done proximally. After identification of the radial artery, the trapezium was then removed in one-thirds with a small osteotome in a piecemeal fashion. A trapeziectomy was then completed. The wound was then thoroughly irrigated.          Attention was then turned toward the [facility] arthroplasty and tendon interposition. The metacarpal was then prepared. The Arthrex guide pin was then placed with a 3.5 SwiveLock anchor. Once this was then carried out and this was in the drill hole, the drill guide was then placed in the drill and a 3.5 mm drill bit was then used in the thumb metacarpal.          Once the drill hole was then placed, a separate counterincision was made proximally with the first dorsal compartment. The first dorsal compartment tendons were then carefully protected and isolated. Two slips of the abductor pollicis and the ulnar slips were then harvested proximally and then retrieved in the wound distally. 2 separate tendons harvested as patient had multiple slips of APL. These were then brought down through the capsule.          A double-tendon transfer was then carried out; one to the thumb metacarpal and the other one to the index metacarpal. The FiberWire suture was  placed into the tendon transfer to the index metacarpal ECRL. The first tendon transfer was then through the thumb metacarpal and back onto itself and around the FCR. The FCR was carefully protected. Appropriate tension was then maintained and the SwiveLock anchor was then applied setting the tension of the thumb metacarpal. Following this, the second tendon transfer was then carried out to the index metacarpal ECRL creating the suspensionplasty with appropriate tension. The tendon was then sewn back onto itself with a FiberWire suture and the remaining portion of the tendon was then brought back into the wound and used for the completion of the arthroplasty. The wound was then irrigated. Gelfoam was then placed in the area. Final radiographs were then obtained, and these images were then saved.          Once this was carried out, thick capsular flaps were then closed with a 4-0 FiberWire suture. The subcutaneous tissues of both incisions were then closed with Monocryl and the skin was closed with a interupted 4-0 Prolene. A sterile compressive bandage was then applied. The patient was placed in a well-padded thumb spica splint, and taken to the recovery room in good condition.  Radiographs:          3 view of wrist interpreted by me show the [facility] arthroplasty and suspensionplasty in good position  Post op plan:         Pt to be discharged to home f/u in office in 2 weeks for xrays and application of Short arm thumb spica cast. Put in therapy order, follow up at 6 week mark for beginning therapy, put in order at first post op visit and begin therapy at six week mark

## 2023-11-27 NOTE — Transfer of Care (Signed)
 Immediate Anesthesia Transfer of Care Note  Patient: [name]  Procedure(s) Performed: ARTHROPLASTY, FINGER (Left: Finger)  Patient Location: PACU  Anesthesia Type:MAC and Regional  Level of Consciousness: awake and patient cooperative  Airway & Oxygen Therapy: Patient Spontanous Breathing and Patient connected to face mask oxygen  Post-op Assessment: Report given to RN and Post -op Vital signs reviewed and stable  Post vital signs: Reviewed and stable  Last Vitals:  Vitals Value Taken Time  BP 124/78 11/27/23 09:10  Temp    Pulse 56 11/27/23 09:14  Resp 15 11/27/23 09:14  SpO2 98 % 11/27/23 09:14  Vitals shown include unfiled device data.  Last Pain:  Vitals:   11/27/23 0645  TempSrc:   PainSc: 4          Complications: No notable events documented.

## 2023-11-28 ENCOUNTER — Encounter (HOSPITAL_BASED_OUTPATIENT_CLINIC_OR_DEPARTMENT_OTHER): Payer: Self-pay | Admitting: Orthopedic Surgery

## 2023-12-01 ENCOUNTER — Encounter (HOSPITAL_COMMUNITY): Payer: Self-pay

## 2023-12-01 ENCOUNTER — Ambulatory Visit (HOSPITAL_COMMUNITY): Admission: EM | Admit: 2023-12-01 | Discharge: 2023-12-01 | Disposition: A | Discharge status: Home / Self Care

## 2023-12-01 DIAGNOSIS — L7682 Other postprocedural complications of skin and subcutaneous tissue: Secondary | ICD-10-CM | POA: Diagnosis not present

## 2023-12-01 DIAGNOSIS — M7989 Other specified soft tissue disorders: Secondary | ICD-10-CM | POA: Diagnosis not present

## 2023-12-01 NOTE — ED Provider Notes (Signed)
 [facility]    CSN: [id number] Arrival date & time: 12/01/23  1647      History   Chief Complaint Chief Complaint  Patient presents with   hand swelling    HPI [name] is a 53 y.o. male.   Patient presents with left forearm and hand swelling.  Patient had surgery to his left hand on 6/30 and had a splint placed at that time.  Patient states he has had increased swelling to his hand since then.  Patient states he did not follow-up appointment with surgeon on 7/1 and they cut some of the splint due to the swelling.  Patient states that he is concerned he may have pressure ulcers under the splint due to increased swelling. Patient states that he did take prescribed oxycodone  about an hour ago for pain.  The history is provided by the patient and medical records.    Past Medical History:  Diagnosis Date   Angio-edema    Anxiety    Disseminated Lyme disease    Dystonia    Foot drop, right 2000   Headache    Hypertension 2019   Lyme disease    Neck pain    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    Urticaria     Patient Active Problem List   Diagnosis Date Noted   Facial weakness 08/22/2023   Chronic migraine w/o aura w/o status migrainosus, not intractable 08/22/2023   Rash 06/23/2023   Insomnia 03/23/2023   Bilateral carpal tunnel syndrome 02/03/2023   Sprain of anterior talofibular ligament of left ankle 09/06/2022   Chronic pain syndrome 08/18/2022   Cervico-occipital neuralgia 01/27/2022   Contact dermatitis due to plant 09/13/2021   Lateral epicondylitis, right elbow 09/10/2021   Depressed mood 05/08/2021   Pain in both hands 05/08/2021   Pain of right thumb 04/12/2021   Other spondylosis with myelopathy, cervical region 02/02/2021   Low back pain 01/19/2021   Swelling of both hands 12/22/2020   Neck pain on left side 09/30/2020   Gait abnormality 09/30/2020   Left-sided weakness 09/01/2020   History of Lyme disease 09/01/2020   Abdominal  pain, chronic, epigastric 08/18/2020   Unintentional weight loss 08/18/2020   Chronic constipation 08/18/2020   History of pancreatitis 08/18/2020   Family history of colon cancer 08/18/2020   Cervical radiculopathy 05/14/2020   Essential hypertension 01/03/2020   Vitamin D  deficiency 11/26/2019   Other fatigue 11/26/2019   Hyperglycemia 11/26/2019   Tobacco use 10/11/2019   Hyperlipemia    Cervical spondylosis 08/15/2019   Fatty liver 05/06/2019   Diverticulosis 05/06/2019   Encounter for other specified surgical aftercare 12/05/2017   S/P lumbar fusion 11/15/2017   Lumbar post-laminectomy syndrome 06/12/2017   Labral tear of shoulder, degenerative, left 02/21/2017   Pain in joint of left shoulder 01/20/2017   Elevated LFTs 01/09/2017   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   Anxiety and depression 01/03/2017    Past Surgical History:  Procedure Laterality Date   ADENOIDECTOMY     ANTERIOR CERVICAL DECOMPRESSION/DISCECTOMY FUSION 4 LEVELS N/A 01/20/2021   Procedure: ANTERIOR CERVICAL DECOMPRESSION/DISCECTOMY FUSION 3  LEVELS C5-T1;  Surgeon: Burnetta Aures, MD;  Location: [facility] OR;  Service: Orthopedics;  Laterality: N/A;   BACK SURGERY     FINGER ARTHROPLASTY Left 11/27/2023   Procedure: ARTHROPLASTY, FINGER;  Surgeon: Shari Easter, MD;  Location: [facility];  Service: Orthopedics;  Laterality: Left;   SACROILIAC JOINT FUSION Right 11/15/2017   Procedure: SACROILIAC JOINT  FUSION;  Surgeon: Burnetta Aures, MD;  Location: [facility] OR;  Service: Orthopedics;  Laterality: Right;  120 mins   SACROILIAC JOINT FUSION Left 07/25/2018   Procedure: SACROILIAC JOINT FUSION;  Surgeon: Burnetta Aures, MD;  Location: [facility] OR;  Service: Orthopedics;  Laterality: Left;  SACROILIAC JOINT FUSION   TONSILLECTOMY     53 years old       Home Medications    Prior to Admission medications   Medication Sig Start Date End Date Taking? Authorizing Provider  famotidine  (PEPCID ) 20 MG  tablet TAKE 1 TABLET(20 MG) BY MOUTH TWICE DAILY 02/01/22   Jennelle Riis, MD  fluticasone  (FLONASE ) 50 MCG/ACT nasal spray SHAKE LIQUID AND USE 2 SPRAYS IN EACH NOSTRIL DAILY Strength: 50 MCG/ACT 06/27/22   Jennelle Riis, MD  gabapentin  (NEURONTIN ) 300 MG capsule TAKE 2 CAPSULES BY MOUTH TWICE A DAY 10/24/23   Jennelle Riis, MD  Multiple Vitamins-Minerals (MENS MULTIVITAMIN) CHEW Chew 1 tablet by mouth daily.    [provider]  ondansetron  (ZOFRAN -ODT) 4 MG disintegrating tablet Take 1 tablet (4 mg total) by mouth every 8 (eight) hours as needed for nausea or vomiting. 08/22/23   Onita Duos, MD  Oxycodone  HCl 10 MG TABS Take 10 mg by mouth 3 (three) times daily.    [provider]  propranolol  ER (INDERAL  LA) 80 MG 24 hr capsule TAKE 1 CAPSULE BY MOUTH EVERY DAY 07/14/23   Jennelle Riis, MD  rosuvastatin  (CRESTOR ) 20 MG tablet TAKE 1 TABLET(20 MG) BY MOUTH DAILY STRENGTH: 20 MG 08/24/23   Jennelle Riis, MD  sertraline  (ZOLOFT ) 100 MG tablet TAKE 1 TABLET DAILY 09/26/23   Jennelle Riis, MD  SUMAtriptan  (IMITREX ) 50 MG tablet Take 1 tablet (50 mg total) by mouth every 2 (two) hours as needed for migraine. May repeat in 2 hours if headache persists or recurs. 08/22/23   Onita Duos, MD  triamcinolone  ointment (KENALOG ) 0.1 % Apply 1 Application topically 2 (two) times daily. 06/23/23   Jennelle Riis, MD  valsartan  (DIOVAN ) 40 MG tablet TAKE 1 TABLET BY MOUTH EVERY DAY 11/16/23   Jennelle Riis, MD    Family History Family History  Problem Relation Age of Onset   Heart disease Mother    Heart disease Father    Colon polyps Father    ALS Sister    Osteoarthritis Brother    Colon cancer Paternal Grandfather        dx at age 37   Healthy Son    Healthy Son    Esophageal cancer Neg Hx    Inflammatory bowel disease Neg Hx    Liver disease Neg Hx    Pancreatic cancer Neg Hx    Stomach cancer Neg Hx     Social History Social History   Tobacco Use   Smoking status:  Every Day    Current packs/day: 0.50    Average packs/day: 0.5 packs/day for 30.0 years (15.0 ttl pk-yrs)    Types: Cigarettes    Passive exposure: Past   Smokeless tobacco: Never   Tobacco comments:    08/22/2023- pt states he smokes 1/2 pack a day   Vaping Use   Vaping status: Never Used  Substance Use Topics   Alcohol use: Not Currently    Comment: stopped 2018   Drug use: No     Allergies   Pregabalin  and Sulfa antibiotics   Review of Systems Review of Systems  Per HPI  Physical Exam Triage Vital Signs ED Triage Vitals  Encounter Vitals  Group     BP 12/01/23 1703 109/74     Girls Systolic BP Percentile --      Girls Diastolic BP Percentile --      Boys Systolic BP Percentile --      Boys Diastolic BP Percentile --      Pulse Rate 12/01/23 1703 80     Resp 12/01/23 1703 16     Temp 12/01/23 1703 98.3 F (36.8 C)     Temp Source 12/01/23 1703 Oral     SpO2 12/01/23 1703 93 %     Weight --      Height --      Head Circumference --      Peak Flow --      Pain Score 12/01/23 1705 6     Pain Loc --      Pain Education --      Exclude from Growth Chart --    No data found.  Updated Vital Signs BP 109/74 (BP Location: Right Arm)   Pulse 80   Temp 98.3 F (36.8 C) (Oral)   Resp 16   SpO2 93%   Visual Acuity Right Eye Distance:   Left Eye Distance:   Bilateral Distance:    Right Eye Near:   Left Eye Near:    Bilateral Near:     Physical Exam Vitals and nursing note reviewed.  Constitutional:      General: He is awake. He is not in acute distress.    Appearance: Normal appearance. He is well-developed and well-groomed. He is not ill-appearing.  Musculoskeletal:     Left hand: Swelling present. Normal strength. Normal sensation. Normal capillary refill. Normal pulse.  Neurological:     Mental Status: He is alert.  Psychiatric:        Behavior: Behavior is cooperative.      UC Treatments / Results  Labs (all labs ordered are listed, but only  abnormal results are displayed) Labs Reviewed - No data to display  EKG   Radiology No results found.  Procedures Procedures (including critical care time)  Medications Ordered in UC Medications - No data to display  Initial Impression / Assessment and Plan / UC Course  I have reviewed the triage vital signs and the nursing notes.  Pertinent labs & imaging results that were available during my care of the patient were reviewed by me and considered in my medical decision making (see chart for details).     Patient is overall well-appearing.  Vitals are stable.  Significant swelling noted to the left hand.  Sensation and strength intact.  Normal capillary refill.  Pulses present and strong.  Splint removed.  There is no swelling or or wounds noted to the area beneath the splint.  Wound appears to be healing well with Steri-Strips and suture in place.  No signs of infection at this time.  Had [employer] replace splint.  Recommended patient go to [facility] first thing in the morning for further evaluation and management of this.  Discussed follow-up and return precautions. Final Clinical Impressions(s) / UC Diagnoses   Final diagnoses:  Hand swelling  Other postoperative complication of subcutaneous tissue     Discharge Instructions      We replaced your splint today. Please go to [facility] at 10 AM tomorrow morning.  They have walk-in clinic hours and you can be seen tomorrow for follow-up related to this.     ED Prescriptions   None    I have reviewed  the PDMP during this encounter.   Johnie Flaming A, NP 12/01/23 [phone]

## 2023-12-01 NOTE — ED Notes (Signed)
 Ortho tech called to redo patients splint.

## 2023-12-01 NOTE — ED Triage Notes (Addendum)
 Pt states he is post left hand surgery on 11/27/2023 states his left forearm and hand are swollen and he has blisters on his hand.  States he would like his splint redone because it is too tight on his arm and hand. States he took and Oxycodone  for pain about one hour ago.

## 2023-12-01 NOTE — Discharge Instructions (Signed)
 We replaced your splint today. Please go to [facility] at 10 AM tomorrow morning.  They have walk-in clinic hours and you can be seen tomorrow for follow-up related to this.

## 2023-12-02 DIAGNOSIS — M25532 Pain in left wrist: Secondary | ICD-10-CM | POA: Diagnosis not present

## 2023-12-09 DIAGNOSIS — Z419 Encounter for procedure for purposes other than remedying health state, unspecified: Secondary | ICD-10-CM | POA: Diagnosis not present

## 2023-12-12 DIAGNOSIS — M79645 Pain in left finger(s): Secondary | ICD-10-CM | POA: Diagnosis not present

## 2023-12-26 ENCOUNTER — Other Ambulatory Visit: Payer: Self-pay | Admitting: Student

## 2024-01-09 DIAGNOSIS — Z419 Encounter for procedure for purposes other than remedying health state, unspecified: Secondary | ICD-10-CM | POA: Diagnosis not present

## 2024-01-09 DIAGNOSIS — M13841 Other specified arthritis, right hand: Secondary | ICD-10-CM | POA: Diagnosis not present

## 2024-01-09 DIAGNOSIS — M79645 Pain in left finger(s): Secondary | ICD-10-CM | POA: Diagnosis not present

## 2024-01-09 NOTE — Progress Notes (Deleted)
 Office Visit Note  Patient: [name]             Date of Birth: 1970/11/18           MRN: [id number]             PCP: Lupie Credit, DO Referring: Lupie Credit, DO Visit Date: 01/23/2024   Subjective:  No chief complaint on file.   History of Present Illness: [name] is a 53 y.o. male here for follow up ***   Previous HPI 07/06/2022 [name] is a 53 y.o. male here for follow up for his hand pain for which she had some previous improvement after addressing cervical spine impingement but is once again having worsening pain difficulty gripping tightly and intermittent swelling especially in the thumb.  He does not recall any new injury to the area.  No new skin rashes.  Does not feel like numbness or tingling like his previous carpal tunnel symptoms but more painful.   Previous HPI 04/12/21 [name] is a 53 y.o. male here for follow up due to increased pain and difficulty using his left thumb with frequent locking up and severely worse right thumb pain as well. He does not recall a specific starting incident with this joint pain but now has severe pain especially with pinching and gripping motions. He did not see significantly increased swelling.   Previous HPI 02/02/21 [name] is a 53 y.o. 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 Since our visit he underwent the cervical spine decompression surgery and is still recovering from this with sutures and dermabond in place and c-collar. He notices a big improvement in strength and mobility of his hands, although still soreness and his thumb remains painful and limited in extension and grip strength on the right. He has had some ongoing urticaria symptoms along his left side and axillary area.    Previous  HPI 01/19/21 [name] is a 53 y.o. male here for chronic pain and swelling of bilateral hands. He has had joint pains and inflammation since at least 2018 when he was evaluated with [facility] including rheumatology and infectious disease workups. These were broadly negative for numerous serology including RF, CCP, ANA, and B. Burgdorferi Abs. He completed treatments with ceftriaxone and doxycycline  at the time and no ongoing infectious concerns described by ID clinic. Rheumatology discussion of possible trying conventional DMARD in additional to chronic NSAID use. He has had ongoing subsequent orthopedics care with numerous degenerative and postraumatic changes in his cervical and lumbar spine and shoulders and previous SI joint fusion.  He was scheduled for anterior cervical spine decompression surgery urgency planned for tomorrow but apparently being postponed due to abnormal clotting factor test. Currently his worst complaint is increasing pain swelling and stiffness affecting his bilateral hands and wrist severely limiting his activities.  Pain is worst on the thumb and around the MCP joints right hand worse than left.  He has trouble gripping objects tightly and when he uses his hands a lot during the day will experience worsening symptoms for up to 3 days afterwards.  Occasionally there is redness overlying the affected joints when they are in exacerbation but otherwise often appears normal.  He is also developed difficulty fully extending his  fingers especially the fourth and fifth fingers of his left hand.  His thumb catches in a fixed position on the right side requiring use of his other hand or an object to restore motion.  No Rheumatology ROS completed.   PMFS History:  Patient Active Problem List   Diagnosis Date Noted   Facial weakness 08/22/2023   Chronic migraine w/o aura w/o status migrainosus, not intractable 08/22/2023   Rash 06/23/2023   Insomnia  03/23/2023   Bilateral carpal tunnel syndrome 02/03/2023   Sprain of anterior talofibular ligament of left ankle 09/06/2022   Chronic pain syndrome 08/18/2022   Cervico-occipital neuralgia 01/27/2022   Contact dermatitis due to plant 09/13/2021   Lateral epicondylitis, right elbow 09/10/2021   Depressed mood 05/08/2021   Pain in both hands 05/08/2021   Pain of right thumb 04/12/2021   Other spondylosis with myelopathy, cervical region 02/02/2021   Low back pain 01/19/2021   Swelling of both hands 12/22/2020   Neck pain on left side 09/30/2020   Gait abnormality 09/30/2020   Left-sided weakness 09/01/2020   History of Lyme disease 09/01/2020   Abdominal pain, chronic, epigastric 08/18/2020   Unintentional weight loss 08/18/2020   Chronic constipation 08/18/2020   History of pancreatitis 08/18/2020   Family history of colon cancer 08/18/2020   Cervical radiculopathy 05/14/2020   Essential hypertension 01/03/2020   Vitamin D  deficiency 11/26/2019   Other fatigue 11/26/2019   Hyperglycemia 11/26/2019   Tobacco use 10/11/2019   Hyperlipemia    Cervical spondylosis 08/15/2019   Fatty liver 05/06/2019   Diverticulosis 05/06/2019   Encounter for other specified surgical aftercare 12/05/2017   S/P lumbar fusion 11/15/2017   Lumbar post-laminectomy syndrome 06/12/2017   Labral tear of shoulder, degenerative, left 02/21/2017   Pain in joint of left shoulder 01/20/2017   Elevated LFTs 01/09/2017   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   Anxiety and depression 01/03/2017    Past Medical History:  Diagnosis Date   Angio-edema    Anxiety    Disseminated Lyme disease    Dystonia    Foot drop, right 2000   Headache    Hypertension 2019   Lyme disease    Neck pain    [place]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    Urticaria     Family History  Problem Relation Age of Onset   Heart disease Mother    Heart disease Father    Colon polyps Father    ALS Sister    Osteoarthritis Brother     Colon cancer Paternal Grandfather        dx at age 63   Healthy Son    Healthy Son    Esophageal cancer Neg Hx    Inflammatory bowel disease Neg Hx    Liver disease Neg Hx    Pancreatic cancer Neg Hx    Stomach cancer Neg Hx    Past Surgical History:  Procedure Laterality Date   ADENOIDECTOMY     ANTERIOR CERVICAL DECOMPRESSION/DISCECTOMY FUSION 4 LEVELS N/A 01/20/2021   Procedure: ANTERIOR CERVICAL DECOMPRESSION/DISCECTOMY FUSION 3  LEVELS C5-T1;  Surgeon: Burnetta Aures, MD;  Location: [facility] OR;  Service: Orthopedics;  Laterality: N/A;   BACK SURGERY     FINGER ARTHROPLASTY Left 11/27/2023   Procedure: ARTHROPLASTY, FINGER;  Surgeon: Shari Easter, MD;  Location: [facility];  Service: Orthopedics;  Laterality: Left;   SACROILIAC JOINT FUSION Right 11/15/2017   Procedure: SACROILIAC JOINT FUSION;  Surgeon: Burnetta Aures, MD;  Location: [facility] OR;  Service:  Orthopedics;  Laterality: Right;  120 mins   SACROILIAC JOINT FUSION Left 07/25/2018   Procedure: SACROILIAC JOINT FUSION;  Surgeon: Burnetta Aures, MD;  Location: [facility] OR;  Service: Orthopedics;  Laterality: Left;  SACROILIAC JOINT FUSION   TONSILLECTOMY     53 years old   Social History   Social History Narrative   Lives at home with his wife.   Right-handed.   Caffeine use: two cans of Mountain Dew.   Immunization History  Administered Date(s) Administered   Influenza Split 01/09/2017   Influenza,inj,quad, With Preservative 01/09/2017   Tdap 11/16/2004     Objective: Vital Signs: There were no vitals taken for this visit.   Physical Exam   Musculoskeletal Exam: ***  CDAI Exam: CDAI Score: -- Patient Global: --; Provider Global: -- Swollen: --; Tender: -- Joint Exam 01/23/2024   No joint exam has been documented for this visit   There is currently no information documented on the homunculus. Go to the Rheumatology activity and complete the homunculus joint exam.  Investigation: No additional  findings.  Imaging: No results found.  Recent Labs: Lab Results  Component Value Date   WBC 8.3 05/11/2022   HGB 15.1 05/11/2022   PLT 212 05/11/2022   NA 142 06/23/2023   K 5.1 06/23/2023   CL 103 06/23/2023   CO2 26 06/23/2023   GLUCOSE 86 06/23/2023   BUN 14 06/23/2023   CREATININE 1.01 06/23/2023   BILITOT 0.4 05/11/2022   ALKPHOS 77 05/11/2022   AST 25 05/11/2022   ALT 20 05/11/2022   PROT 7.2 05/11/2022   ALBUMIN  4.9 05/11/2022   CALCIUM  10.0 06/23/2023   GFRAA 112 06/08/2020    Speciality Comments: No specialty comments available.  Procedures:  No procedures performed Allergies: Pregabalin  and Sulfa antibiotics   Assessment / Plan:     Visit Diagnoses: No diagnosis found.  ***  Orders: No orders of the defined types were placed in this encounter.  No orders of the defined types were placed in this encounter.    Follow-Up Instructions: No follow-ups on file.   Fabien Travelstead M Rivkah Wolz, CMA  Note - This record has been created using Animal nutritionist.  Chart creation errors have been sought, but may not always  have been located. Such creation errors do not reflect on  the standard of medical care.

## 2024-01-17 DIAGNOSIS — M25542 Pain in joints of left hand: Secondary | ICD-10-CM | POA: Diagnosis not present

## 2024-01-23 ENCOUNTER — Ambulatory Visit: Admitting: Internal Medicine

## 2024-01-23 DIAGNOSIS — M533 Sacrococcygeal disorders, not elsewhere classified: Secondary | ICD-10-CM | POA: Diagnosis not present

## 2024-01-23 DIAGNOSIS — M79644 Pain in right finger(s): Secondary | ICD-10-CM

## 2024-01-23 DIAGNOSIS — M5416 Radiculopathy, lumbar region: Secondary | ICD-10-CM | POA: Diagnosis not present

## 2024-01-23 DIAGNOSIS — R6889 Other general symptoms and signs: Secondary | ICD-10-CM

## 2024-01-23 DIAGNOSIS — Z8619 Personal history of other infectious and parasitic diseases: Secondary | ICD-10-CM

## 2024-01-24 DIAGNOSIS — M25542 Pain in joints of left hand: Secondary | ICD-10-CM | POA: Diagnosis not present

## 2024-02-06 DIAGNOSIS — Z4789 Encounter for other orthopedic aftercare: Secondary | ICD-10-CM | POA: Diagnosis not present

## 2024-02-06 DIAGNOSIS — M13841 Other specified arthritis, right hand: Secondary | ICD-10-CM | POA: Diagnosis not present

## 2024-02-08 DIAGNOSIS — M25542 Pain in joints of left hand: Secondary | ICD-10-CM | POA: Diagnosis not present

## 2024-02-09 DIAGNOSIS — Z419 Encounter for procedure for purposes other than remedying health state, unspecified: Secondary | ICD-10-CM | POA: Diagnosis not present

## 2024-02-12 DIAGNOSIS — M25542 Pain in joints of left hand: Secondary | ICD-10-CM | POA: Diagnosis not present

## 2024-02-15 DIAGNOSIS — M25542 Pain in joints of left hand: Secondary | ICD-10-CM | POA: Diagnosis not present

## 2024-02-15 NOTE — Telephone Encounter (Signed)
 He left me a voice mail saying that he got an authorization from insurance but no one ever called him to schedule. [facility] left him two voice mails in April and he didn't call back. The PA will have to be redone before he can be scheduled.

## 2024-02-17 ENCOUNTER — Encounter: Payer: Self-pay | Admitting: Family Medicine

## 2024-02-20 ENCOUNTER — Encounter: Payer: Self-pay | Admitting: Student

## 2024-02-20 ENCOUNTER — Ambulatory Visit (INDEPENDENT_AMBULATORY_CARE_PROVIDER_SITE_OTHER): Admitting: Student

## 2024-02-20 VITALS — BP 126/86 | HR 68 | Temp 98.1°F | Ht 72.0 in | Wt 247.2 lb

## 2024-02-20 DIAGNOSIS — R232 Flushing: Secondary | ICD-10-CM | POA: Diagnosis not present

## 2024-02-20 DIAGNOSIS — R7989 Other specified abnormal findings of blood chemistry: Secondary | ICD-10-CM | POA: Diagnosis not present

## 2024-02-20 DIAGNOSIS — R5383 Other fatigue: Secondary | ICD-10-CM | POA: Diagnosis not present

## 2024-02-20 MED ORDER — HYDROXYZINE HCL 10 MG PO TABS: 10.0000 mg | ORAL_TABLET | Freq: Three times a day (TID) | ORAL | 0 refills | Status: DC | PRN

## 2024-02-20 NOTE — Assessment & Plan Note (Signed)
 Check CMP.  ?

## 2024-02-20 NOTE — Progress Notes (Signed)
    SUBJECTIVE:   CHIEF COMPLAINT / HPI:   [name] is a 53 y.o. male presenting for new onset hot flashes.   Symptoms started 2 weeks ago Continually getting worse now having hot flashes 3-4x per day The last time her felt that way he was admitted to [facility] for Lymes disease  Hot flashes associated with migraines  Migraines began after 5 level cervical spine fusion 4 years ago  Follows with neurology - has an appointment with them in 2 weeks - they are planning for an MRI brain  Follows with allergy and has an upcoming appointment   PERTINENT  PMH / PSH: reviewed and updated.  OBJECTIVE:   BP 126/86   Pulse 68   Temp 98.1 F (36.7 C) (Oral)   Ht 6' (1.829 m)   Wt 247 lb 4 oz (112.2 kg)   SpO2 99%   BMI 33.53 kg/m   Chronically ill-appearing, no acute distress Cardio: Regular rate, regular rhythm, no murmurs on exam. Pulm: Clear, no wheezing, no crackles. No increased work of breathing Abdominal: bowel sounds present, soft, non-tender, non-distended Extremities: no peripheral edema  Neuro: alert and oriented x3, speech normal in content, no facial asymmetry, strength intact and equal bilaterally in UE and LE, pupils equal and reactive to light.  ASSESSMENT/PLAN:   Assessment & Plan Hot flashes Unclear etiology at this time. He certainly is a complex ortho and neurological patient. Suspect presentation could be related to worsening neck pain. He appears anxious about his medical problems at baseline which are certainly exacerbating symptoms.  Prescribed trial of Atarax  10 mg TID PRN for hot flashes  On review of hydration habits, appears to drink more soda and tea rather than water. Symptoms could be exacerbated by dehydration - encouraged patient to drink more water  Will treat what we can at [facility] office, otherwise will need specialist follow up.  Elevated LFTs Check CMP Other fatigue Check CBC w diff     Damien Pinal, DO [facility]

## 2024-02-20 NOTE — Assessment & Plan Note (Signed)
Check CBC w/ diff.

## 2024-02-20 NOTE — Patient Instructions (Signed)
 It was great to see you today!   I am prescribing Atarax  10 mg. You can take this up to three times per day for the hot flashes.   Focus on staying hydrated during the day. Try to swap the pepsi and tea with more water throughout the day.   Follow up with your specialist as scheduled.   Follow up in 1-2 weeks if no improvement   Future Appointments  Date Time Provider Department Center  02/27/2024  1:30 PM Onita Duos, MD [facility] None  03/12/2024  2:00 PM Iva Marty Saltness, MD [facility] None  03/25/2024  2:20 PM Rice, Lonni ORN, MD [facility] None    Please arrive 15 minutes before your appointment to ensure smooth check in process.  If you are more than 15 minutes late, you may be asked to reschedule.   Please bring a list of your medications with you to all appointments.   Please call the clinic at [phone] if your symptoms worsen or you have any concerns.  Thank you for allowing me to participate in your care, Dr. Damien Pinal [facility]

## 2024-02-21 ENCOUNTER — Other Ambulatory Visit: Payer: Self-pay | Admitting: *Deleted

## 2024-02-21 DIAGNOSIS — M79641 Pain in right hand: Secondary | ICD-10-CM

## 2024-02-21 LAB — COMPREHENSIVE METABOLIC PANEL WITH GFR
ALT: 13 IU/L (ref 0–44)
AST: 18 IU/L (ref 0–40)
Albumin: 4.5 g/dL (ref 3.8–4.9)
Alkaline Phosphatase: 75 IU/L (ref 47–123)
BUN/Creatinine Ratio: 12 (ref 9–20)
BUN: 11 mg/dL (ref 6–24)
Bilirubin Total: 0.3 mg/dL (ref 0.0–1.2)
CO2: 21 mmol/L (ref 20–29)
Calcium: 9.3 mg/dL (ref 8.7–10.2)
Chloride: 104 mmol/L (ref 96–106)
Creatinine, Ser: 0.94 mg/dL (ref 0.76–1.27)
Globulin, Total: 2.7 g/dL (ref 1.5–4.5)
Glucose: 103 mg/dL — ABNORMAL HIGH (ref 70–99)
Potassium: 4.3 mmol/L (ref 3.5–5.2)
Sodium: 141 mmol/L (ref 134–144)
Total Protein: 7.2 g/dL (ref 6.0–8.5)
eGFR: 98 mL/min/1.73 (ref >59)

## 2024-02-21 LAB — CBC WITH DIFFERENTIAL/PLATELET
Basophils Absolute: 0 x10E3/uL (ref 0.0–0.2)
Basos: 0 %
EOS (ABSOLUTE): 0.2 x10E3/uL (ref 0.0–0.4)
Eos: 2 %
Hematocrit: 43.8 % (ref 37.5–51.0)
Hemoglobin: 14.3 g/dL (ref 13.0–17.7)
Immature Grans (Abs): 0 x10E3/uL (ref 0.0–0.1)
Immature Granulocytes: 0 %
Lymphocytes Absolute: 2.7 x10E3/uL (ref 0.7–3.1)
Lymphs: 32 %
MCH: 30.2 pg (ref 26.6–33.0)
MCHC: 32.6 g/dL (ref 31.5–35.7)
MCV: 93 fL (ref 79–97)
Monocytes Absolute: 0.6 x10E3/uL (ref 0.1–0.9)
Monocytes: 7 %
Neutrophils Absolute: 5 x10E3/uL (ref 1.4–7.0)
Neutrophils: 59 %
Platelets: 220 x10E3/uL (ref 150–450)
RBC: 4.73 x10E6/uL (ref 4.14–5.80)
RDW: 12.4 % (ref 11.6–15.4)
WBC: 8.6 x10E3/uL (ref 3.4–10.8)

## 2024-02-21 NOTE — Telephone Encounter (Signed)
 His insurance is asking for more recent office visit notes since the ones I sent are from March. He is coming here on 9/30 we can redo the PA after that.

## 2024-02-22 ENCOUNTER — Ambulatory Visit: Payer: Self-pay | Admitting: Student

## 2024-02-22 MED ORDER — GABAPENTIN 300 MG PO CAPS: 600.0000 mg | ORAL_CAPSULE | Freq: Two times a day (BID) | ORAL | 3 refills | Status: DC

## 2024-02-27 ENCOUNTER — Encounter: Payer: Self-pay | Admitting: Neurology

## 2024-02-27 ENCOUNTER — Ambulatory Visit: Admitting: Neurology

## 2024-02-27 VITALS — BP 124/82 | Ht 72.0 in | Wt 248.0 lb

## 2024-02-27 DIAGNOSIS — R519 Headache, unspecified: Secondary | ICD-10-CM | POA: Insufficient documentation

## 2024-02-27 DIAGNOSIS — M79641 Pain in right hand: Secondary | ICD-10-CM | POA: Diagnosis not present

## 2024-02-27 DIAGNOSIS — M79642 Pain in left hand: Secondary | ICD-10-CM | POA: Diagnosis not present

## 2024-02-27 DIAGNOSIS — G43709 Chronic migraine without aura, not intractable, without status migrainosus: Secondary | ICD-10-CM | POA: Diagnosis not present

## 2024-02-27 MED ORDER — SUMATRIPTAN SUCCINATE 100 MG PO TABS: 100.0000 mg | ORAL_TABLET | ORAL | 12 refills | Status: AC | PRN

## 2024-02-27 MED ORDER — GABAPENTIN 300 MG PO CAPS: 600.0000 mg | ORAL_CAPSULE | Freq: Three times a day (TID) | ORAL | 11 refills | Status: AC

## 2024-02-27 NOTE — Progress Notes (Signed)
 Chief Complaint  Patient presents with   Follow-up    Rm 14, alone, needed office for MRI auth      ASSESSMENT AND PLAN  JURGEN GROENEVELD III is a 53 y.o. male   Left facial weakness, worsening headaches  Variable effort on examination,  With his reported history of Lyme disease, do worry about structural abnormality  MRI of the brain with without contrast  Higher dose of Imitrex  100 mg as needed  Higher dose of gabapentin  up to 300 mg to 3 times a day for migraine prevention  DIAGNOSTIC DATA (LABS, IMAGING, TESTING) - I reviewed patient records, labs, notes, testing and imaging myself where available.   MEDICAL HISTORY:  TEVIN SHILLINGFORD III, is a 53 year old male seen in request by Dr. Burnetta, [place], for evaluation of left facial weakness, chronic migraine, primary care physician is Dr. Jennelle Riis, initial evaluation with his fiance Amy on August 22, 2023  History is obtained from the patient and review of electronic medical records. I personally reviewed pertinent available imaging films in PACS.   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 Lyme disease/[place] Spotted Fever, CDC  He reported a history of tickborne disease in July 2018, was under the care of [facility] infectious disease, skin rash, low-grade fever, workup revealed elevated IgG for rickettsii (RMSF) and was given oral doxycycline  for 7 days. He returned about 10 days later and received another 10-day course. His symptoms persisted, and he was hospitalized to [facility] from 01/20/18 - 01/23/18 with skin rash, and decision was made to administer a 4-week course with IV ceftriaxone for suspected early disseminated Lyme disease, which he completed on 02/18/18. His serology for Lyme disease was negative.   He complains of multiple joints pain, shoulder pain, MRI did show labral tear of left shoulder, also was diagnosed with chronic  fatigue syndrome, intractable headache, disabled since then  End of February 2025, he noticed left face achy pain, as if somebody hit his face by a fist, swollen sensation, muscle spasm, difficulty blinking, but upon observation, he has variable effort, without attention, he has full motor strength on the left upper and lower face,  In addition he complains 3 weeks history of memory trouble, difficulty sleeping, dizziness,  He also complains of daily migraine headache for few weeks, before this he was having migraine couple times each week  UPDATE Sept 30 2025: He continue complains of frequent headaches, couple times a week taking Imitrex  as needed works well, a lot of tension in his neck, complains of rash broke out, or another episode of flareup of Lyme per patient, under the care of allergist,  MRI of the brain was approved, but was never scheduled  PHYSICAL EXAM:   Vitals:   02/27/24 1336  BP: 124/82  Weight: 248 lb (112.5 kg)  Height: 6' (1.829 m)     Body mass index is 33.63 kg/m.  PHYSICAL EXAMNIATION:  Gen: NAD, conversant, well nourised, well groomed                     Cardiovascular: Regular rate rhythm, no peripheral edema, warm, nontender. Eyes: Conjunctivae clear without exudates or hemorrhage Neck: Supple, no carotid bruits. Pulmonary: Clear to auscultation bilaterally   NEUROLOGICAL EXAM:  MENTAL STATUS: Speech/cognition: In mild distress, awake, alert, oriented to history taking and casual conversation CRANIAL NERVES: CN II: Visual fields are full to confrontation. Pupils are  round equal and briskly reactive to light. CN III, IV, VI: extraocular movement are normal. No ptosis. CN V: Facial sensation is intact to light touch CN VII: Variable effort on examinations, no significant left upper or lower face weakness, inconsistent on examination CN VIII: Hearing is normal to causal conversation. CN IX, X: Phonation is normal. CN XI: Head turning and shoulder  shrug are intact  MOTOR: Right foot drop, variable effort on examinations,  REFLEXES: Reflexes are 2+ and symmetric at the biceps, triceps, knees, and ankles. Plantar responses are flexor.  SENSORY: Intact to light touch, pinprick and vibratory sensation are intact in fingers and toes.  COORDINATION: There is no trunk or limb dysmetria noted.  GAIT/STANCE: Push-up to get up from seated position, cautious, dragging right foot  REVIEW OF SYSTEMS:  Full 14 system review of systems performed and notable only for as above All other review of systems were negative.   ALLERGIES: Allergies  Allergen Reactions   Pregabalin  Hives, Shortness Of Breath and Other (See Comments)    **ATTEMPTING TO TAKE THIS MEDICATION AGAIN** Insomnia Mood alter   Sulfa Antibiotics Anaphylaxis and Other (See Comments)    HOME MEDICATIONS: Current Outpatient Medications  Medication Sig Dispense Refill   famotidine  (PEPCID ) 20 MG tablet TAKE 1 TABLET(20 MG) BY MOUTH TWICE DAILY 180 tablet 1   fluticasone  (FLONASE ) 50 MCG/ACT nasal spray SHAKE LIQUID AND USE 2 SPRAYS IN EACH NOSTRIL DAILY STRENGTH: 50 MCG/ACT 48 mL 2   gabapentin  (NEURONTIN ) 300 MG capsule Take 2 capsules (600 mg total) by mouth 2 (two) times daily. 120 capsule 3   hydrOXYzine  (ATARAX ) 10 MG tablet Take 1 tablet (10 mg total) by mouth 3 (three) times daily as needed. 30 tablet 0   Multiple Vitamins-Minerals (MENS MULTIVITAMIN) CHEW Chew 1 tablet by mouth daily.     ondansetron  (ZOFRAN -ODT) 4 MG disintegrating tablet Take 1 tablet (4 mg total) by mouth every 8 (eight) hours as needed for nausea or vomiting. 20 tablet 6   Oxycodone  HCl 10 MG TABS Take 10 mg by mouth 3 (three) times daily.     propranolol  ER (INDERAL  LA) 80 MG 24 hr capsule TAKE 1 CAPSULE BY MOUTH EVERY DAY 90 capsule 2   rosuvastatin  (CRESTOR ) 20 MG tablet TAKE 1 TABLET(20 MG) BY MOUTH DAILY STRENGTH: 20 MG 90 tablet 3   sertraline  (ZOLOFT ) 100 MG tablet TAKE 1 TABLET DAILY 90  tablet 1   SUMAtriptan  (IMITREX ) 50 MG tablet Take 1 tablet (50 mg total) by mouth every 2 (two) hours as needed for migraine. May repeat in 2 hours if headache persists or recurs. 10 tablet 5   triamcinolone  ointment (KENALOG ) 0.1 % Apply 1 Application topically 2 (two) times daily. 60 g 0   valsartan  (DIOVAN ) 40 MG tablet TAKE 1 TABLET BY MOUTH EVERY DAY 90 tablet 2   No current facility-administered medications for this visit.    PAST MEDICAL HISTORY: Past Medical History:  Diagnosis Date   Angio-edema    Anxiety    Disseminated Lyme disease    Dystonia    Foot drop, right 2000   Headache    Hypertension 2019   Lyme disease    Neck pain    Rock[place]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    Urticaria     PAST SURGICAL HISTORY: Past Surgical History:  Procedure Laterality Date   ADENOIDECTOMY     ANTERIOR CERVICAL DECOMPRESSION/DISCECTOMY FUSION 4 LEVELS N/A 01/20/2021   Procedure: ANTERIOR CERVICAL DECOMPRESSION/DISCECTOMY FUSION 3  LEVELS C5-T1;  Surgeon: Burnetta Aures, MD;  Location: [facility] OR;  Service: Orthopedics;  Laterality: N/A;   BACK SURGERY     FINGER ARTHROPLASTY Left 11/27/2023   Procedure: ARTHROPLASTY, FINGER;  Surgeon: Shari Easter, MD;  Location: [facility];  Service: Orthopedics;  Laterality: Left;   SACROILIAC JOINT FUSION Right 11/15/2017   Procedure: SACROILIAC JOINT FUSION;  Surgeon: Burnetta Aures, MD;  Location: [facility] OR;  Service: Orthopedics;  Laterality: Right;  120 mins   SACROILIAC JOINT FUSION Left 07/25/2018   Procedure: SACROILIAC JOINT FUSION;  Surgeon: Burnetta Aures, MD;  Location: [facility] OR;  Service: Orthopedics;  Laterality: Left;  SACROILIAC JOINT FUSION   TONSILLECTOMY     53 years old    FAMILY HISTORY: Family History  Problem Relation Age of Onset   Heart disease Mother    Heart disease Father    Colon polyps Father    ALS Sister    Osteoarthritis Brother    Colon cancer Paternal Grandfather        dx at age 62   Healthy Son    Healthy  Son    Esophageal cancer Neg Hx    Inflammatory bowel disease Neg Hx    Liver disease Neg Hx    Pancreatic cancer Neg Hx    Stomach cancer Neg Hx     SOCIAL HISTORY: Social History   Socioeconomic History   Marital status: Legally Separated    Spouse name: Not on file   Number of children: 2   Years of education: college   Highest education level: Associate degree: occupational, Scientist, product/process development, or vocational program  Occupational History   Occupation: currently seeking disability  Tobacco Use   Smoking status: Every Day    Current packs/day: 0.50    Average packs/day: 0.5 packs/day for 30.0 years (15.0 ttl pk-yrs)    Types: Cigarettes    Passive exposure: Past   Smokeless tobacco: Never   Tobacco comments:    08/22/2023- pt states he smokes 1/2 pack a day   Vaping Use   Vaping status: Never Used  Substance and Sexual Activity   Alcohol use: Not Currently    Comment: stopped 2018   Drug use: No   Sexual activity: Yes  Other Topics Concern   Not on file  Social History Narrative   Lives at home with his wife.   Right-handed.   Caffeine use: two cans of Mountain Dew.   Social Drivers of Health   Financial Resource Strain: Medium Risk (06/22/2023)   Overall Financial Resource Strain (CARDIA)    Difficulty of Paying Living Expenses: Somewhat hard  Food Insecurity: Food Insecurity Present (06/22/2023)   Hunger Vital Sign    Worried About Running Out of Food in the Last Year: Sometimes true    Ran Out of Food in the Last Year: Sometimes true  Transportation Needs: Unmet Transportation Needs (06/22/2023)   PRAPARE - Administrator, Civil Service (Medical): Yes    Lack of Transportation (Non-Medical): Yes  Physical Activity: Unknown (06/22/2023)   Exercise Vital Sign    Days of Exercise per Week: 0 days    Minutes of Exercise per Session: Not on file  Stress: Stress Concern Present (06/22/2023)   [employer] of Occupational Health - Occupational Stress  Questionnaire    Feeling of Stress : To some extent  Social Connections: Socially Isolated (06/22/2023)   Social Connection and Isolation Panel    Frequency of Communication with Friends and Family: Twice a week  Frequency of Social Gatherings with Friends and Family: Never    Attends Religious Services: Never    Database administrator or Organizations: No    Attends Engineer, structural: Not on file    Marital Status: Divorced  Intimate Partner Violence: Not on file      Modena Callander, M.D. Ph.D.  [facility] [address] [place], [place] [address] Ph: [phone] Fax: [phone]  CC:  Jennelle Riis, MD No address on file  Lupie Credit, DO

## 2024-02-29 ENCOUNTER — Telehealth: Payer: Self-pay | Admitting: Neurology

## 2024-02-29 DIAGNOSIS — M25542 Pain in joints of left hand: Secondary | ICD-10-CM | POA: Diagnosis not present

## 2024-02-29 NOTE — Telephone Encounter (Signed)
 [facility]: [id number] exp. 02/29/24-04/29/24 sent to GI [phone]

## 2024-03-06 DIAGNOSIS — M25542 Pain in joints of left hand: Secondary | ICD-10-CM | POA: Diagnosis not present

## 2024-03-08 ENCOUNTER — Encounter: Payer: Self-pay | Admitting: Neurology

## 2024-03-12 ENCOUNTER — Ambulatory Visit: Admitting: Allergy & Immunology

## 2024-03-12 ENCOUNTER — Other Ambulatory Visit: Payer: Self-pay

## 2024-03-12 ENCOUNTER — Encounter: Payer: Self-pay | Admitting: Allergy & Immunology

## 2024-03-12 VITALS — BP 118/88 | HR 72 | Temp 98.2°F | Resp 14 | Ht 69.5 in | Wt 248.6 lb

## 2024-03-12 DIAGNOSIS — B999 Unspecified infectious disease: Secondary | ICD-10-CM | POA: Diagnosis not present

## 2024-03-12 DIAGNOSIS — M8630 Chronic multifocal osteomyelitis, unspecified site: Secondary | ICD-10-CM

## 2024-03-12 DIAGNOSIS — Z79899 Other long term (current) drug therapy: Secondary | ICD-10-CM | POA: Diagnosis not present

## 2024-03-12 DIAGNOSIS — M533 Sacrococcygeal disorders, not elsewhere classified: Secondary | ICD-10-CM | POA: Diagnosis not present

## 2024-03-12 NOTE — Patient Instructions (Addendum)
 1. 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 - Information provided on Xolair. - Previous workup noted only elevated inflammatory markers.  - Continue with the suppressive dosing of antihistamines.   - In the meantime, continue suppressive dosing of antihistamines:     - Morning: Allegra  (fexofenadine ) 180mg  (one tablet) + Pepcid  (famotidine ) 20mg     - Noon: Allegra  (fexofenadine ) 180mg  (one tablet) + Pepcid  (famotidine ) 20mg     - Evening: Allegra  (fexofenadine ) 180mg  (one tablet) + Pepcid  (famotidine ) 20mg  + Singulair  (montelukast ) 10mg  - You can change this dosing at home, decreasing the dose as needed or increasing the dosing as needed.   2. Recurrent osteomyelitis  - We are going to get some labs to look at your immune system.  - We are also getting some immune genetic testing to see if you have an immunodeficiency.    3. Return in about 3 months (around 06/12/2024). You can have the follow up appointment with Dr. Iva or a Nurse Practicioner (our Nurse Practitioners are excellent and always have Physician oversight!).    Please inform us  of any Emergency Department visits, hospitalizations, or changes in symptoms. Call us  before going to the ED for breathing or allergy symptoms since we might be able to fit you in for a sick visit. Feel free to contact us  anytime with any questions, problems, or concerns.  It was a pleasure to see you again today!  Websites that have reliable patient information: 1. American Academy of Asthma, Allergy, and Immunology: www.aaaai.org 2. Food Allergy Research and Education (FARE): foodallergy.org 3. Mothers of Asthmatics: http://www.asthmacommunitynetwork.org 4. American College of Allergy, Asthma, and Immunology: www.acaai.org      "Like" us  on Facebook and Instagram for our latest updates!      A healthy democracy works best when [employer] participate! Make sure you are registered to vote! If you have moved or changed any of your contact information, you will need to get this updated before voting! Scan the QR codes below to learn more!

## 2024-03-12 NOTE — Progress Notes (Signed)
 NEW PATIENT  Date of Service/Encounter:  03/12/24  Consult requested by: Lupie Credit, DO   Assessment:   Chronic idiopathic urticaria - unclear etiology   Diagnosis of chronic Lyme disease   We are going to do an immune workup given his history of this chronic Lyme disease.  He has a history of multiple bone lesions as well which is concerning for inflammatory disease.  I would assume that they have already worked up metastatic cancer as a cause of these lytic lesions.  All of the workup for the chronic Lyme was done at [facility], so unfortunately I do not have anything to review.  Will start with an immune workup and go from there.  We will also get a genetic test to look for inborn errors of immunity.    Plan/Recommendations:   1. 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 - Information provided on Xolair. - Previous workup noted only elevated inflammatory markers.  - Continue with the suppressive dosing of antihistamines.   - In the meantime, continue suppressive dosing of antihistamines:     - Morning: Allegra  (fexofenadine ) 180mg  (one tablet) + Pepcid  (famotidine ) 20mg     - Noon: Allegra  (fexofenadine ) 180mg  (one tablet) + Pepcid  (famotidine ) 20mg     - Evening: Allegra  (fexofenadine ) 180mg  (one tablet) + Pepcid  (famotidine ) 20mg  + Singulair  (montelukast ) 10mg  - You can change this dosing at home, decreasing the dose as needed or increasing the dosing as needed.   2. Recurrent osteomyelitis  - We are going to get some labs to look at your immune system.  - We are also getting some immune genetic testing to see if you have an immunodeficiency.    3. Return in about 3 months (around 06/12/2024). You can have the follow up appointment with Dr. Iva or a Nurse Practicioner (our Nurse Practitioners are excellent and always have Physician oversight!).    This note in  its entirety was forwarded to the Provider who requested this consultation.  Subjective:   [name] is a 53 y.o. male presenting today for evaluation of  Chief Complaint  Patient presents with   Establish Care   Urticaria    [name] has a history of the following: Patient Active Problem List   Diagnosis Date Noted   Worsening headaches 02/27/2024   Facial weakness 08/22/2023   Chronic migraine w/o aura w/o status migrainosus, not intractable 08/22/2023   Rash 06/23/2023   Insomnia 03/23/2023   Bilateral carpal tunnel syndrome 02/03/2023   Sprain of anterior talofibular ligament of left ankle 09/06/2022   Chronic pain syndrome 08/18/2022   Cervico-occipital neuralgia 01/27/2022   Contact dermatitis due to plant 09/13/2021   Lateral epicondylitis, right elbow 09/10/2021   Depressed mood 05/08/2021   Pain in both hands 05/08/2021   Pain of right thumb 04/12/2021   Other spondylosis with myelopathy, cervical region 02/02/2021   Low back pain 01/19/2021   Swelling of both hands 12/22/2020   Neck pain on left side 09/30/2020   Gait abnormality 09/30/2020   Left-sided weakness 09/01/2020   History of Lyme disease 09/01/2020   Abdominal pain, chronic, epigastric 08/18/2020   Unintentional weight loss 08/18/2020   Chronic constipation 08/18/2020   History of pancreatitis 08/18/2020   Family history of colon cancer 08/18/2020   Cervical radiculopathy 05/14/2020   Essential hypertension 01/03/2020   Vitamin D  deficiency 11/26/2019  Other fatigue 11/26/2019   Hyperglycemia 11/26/2019   Tobacco use 10/11/2019   Hyperlipemia    Cervical spondylosis 08/15/2019   Fatty liver 05/06/2019   Diverticulosis 05/06/2019   Encounter for other specified surgical aftercare 12/05/2017   S/P lumbar fusion 11/15/2017   Lumbar post-laminectomy syndrome 06/12/2017   Labral tear of shoulder, degenerative, left 02/21/2017   Pain in joint of left shoulder 01/20/2017    Elevated LFTs 01/09/2017   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  Discussed the use of AI scribe software for clinical note transcription with the patient and/or guardian, who gave verbal consent to proceed.  [name] was referred by Lupie Camie SPARK     [name] is a 53 y.o. male presenting for an evaluation of urticara.  He was last seen in September 2021.  At that time, we continued him on suppressive antihistamine doses including Allegra  and Pepcid  in the morning and at night as well as Allegra  at noon.  We did talk about Xolair as well.  We did an extensive lab workup that showed an elevated CRP and ESR.  A metabolic panel was normal.  Since last visit, he has mostly done well.  His chronic Lyme disease has been an issue, but the hives resolved for a long period of time.  He has been managing Lyme disease since late 03-29-2017 after a tick was removed from his arm. His symptoms include recurrent joint pain, sweats, and goosebumps, which often require treatment with doxycycline . Symptom-free intervals have lasted up to four and a half months. He uses oral antibiotics and monitors for early signs of flares.  This is all managed at [facility].  Chronic hives have been persistent with limited improvement despite antihistamines, cortisone, Pepcid  AC, and hydroxyzine . These medications provide some relief, but he avoids prednisone  unless the hives are severe. Heat worsens his hives, and he avoids wearing an AFO due to skin irritation.  He has undergone multiple surgeries managed by [facility], including nucleoplasty surgeries, affecting his ability to take NSAIDs. Recovery from these surgeries is ongoing, with varying timelines for different procedures.  Chronic nasal congestion is attributed to frequent nasal spray use, which he uses two to three times daily. His nose is always congested, which  he attributes to frequent nasal spray use, but he did not specifically mention the frequency of sinus infections or pneumonias.  He has a family history of possible Lyme disease or lupus, as his oldest sister died in Mar 29, 2013 from an undiagnosed condition that could have been either. 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  He is currently on oxycodone  15 mg for pain management related to Lyme disease and has been on this medication for a while without changes.  He is in the process of obtaining disability benefits, which has been a lengthy process over four years.     Otherwise, there is no history of other atopic diseases, including asthma, food allergies, drug allergies, stinging insect allergies, or contact dermatitis. There is no significant infectious history. Vaccinations are up to date.    Past Medical History: Patient Active Problem List   Diagnosis Date Noted   Worsening headaches 02/27/2024   Facial weakness 08/22/2023   Chronic migraine w/o aura w/o status migrainosus, not intractable 08/22/2023   Rash 06/23/2023   Insomnia 03/23/2023   Bilateral carpal tunnel syndrome  02/03/2023   Sprain of anterior talofibular ligament of left ankle 09/06/2022   Chronic pain syndrome 08/18/2022   Cervico-occipital neuralgia 01/27/2022   Contact dermatitis due to plant 09/13/2021   Lateral epicondylitis, right elbow 09/10/2021   Depressed mood 05/08/2021   Pain in both hands 05/08/2021   Pain of right thumb 04/12/2021   Other spondylosis with myelopathy, cervical region 02/02/2021   Low back pain 01/19/2021   Swelling of both hands 12/22/2020   Neck pain on left side 09/30/2020   Gait abnormality 09/30/2020   Left-sided weakness 09/01/2020   History of Lyme disease 09/01/2020   Abdominal pain, chronic, epigastric 08/18/2020   Unintentional weight loss 08/18/2020   Chronic constipation 08/18/2020   History of pancreatitis  08/18/2020   Family history of colon cancer 08/18/2020   Cervical radiculopathy 05/14/2020   Essential hypertension 01/03/2020   Vitamin D  deficiency 11/26/2019   Other fatigue 11/26/2019   Hyperglycemia 11/26/2019   Tobacco use 10/11/2019   Hyperlipemia    Cervical spondylosis 08/15/2019   Fatty liver 05/06/2019   Diverticulosis 05/06/2019   Encounter for other specified surgical aftercare 12/05/2017   S/P lumbar fusion 11/15/2017   Lumbar post-laminectomy syndrome 06/12/2017   Labral tear of shoulder, degenerative, left 02/21/2017   Pain in joint of left shoulder 01/20/2017   Elevated LFTs 01/09/2017   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 If you have any questions, ask your nurse or doctor.          famotidine  20 MG tablet Commonly known as: PEPCID  TAKE 1 TABLET(20 MG) BY MOUTH TWICE DAILY   fluticasone  50 MCG/ACT nasal spray Commonly known as: FLONASE  SHAKE LIQUID AND USE 2 SPRAYS IN EACH NOSTRIL DAILY STRENGTH: 50 MCG/ACT   gabapentin  300 MG capsule Commonly known as: NEURONTIN  Take 2 capsules (600 mg total) by mouth 3 (three) times daily.   hydrOXYzine  10 MG tablet Commonly known as: ATARAX  Take 1 tablet (10 mg total) by mouth 3 (three) times daily as needed.   Mens Multivitamin Chew Chew 1 tablet by mouth daily.   ondansetron  4 MG disintegrating tablet Commonly known as: ZOFRAN -ODT Take 1 tablet (4 mg total) by mouth every 8 (eight) hours as needed for nausea or vomiting.   Oxycodone  HCl 10 MG Tabs Take 10 mg by mouth 3 (three) times daily.   propranolol  ER 80 MG 24 hr  capsule Commonly known as: INDERAL  LA TAKE 1 CAPSULE BY MOUTH EVERY DAY   rosuvastatin  20 MG tablet Commonly known as: CRESTOR  TAKE 1 TABLET(20 MG) BY MOUTH DAILY STRENGTH: 20 MG   sertraline  100 MG tablet Commonly known as: ZOLOFT  TAKE 1 TABLET DAILY   SUMAtriptan  100 MG tablet Commonly known as: Imitrex  Take 1 tablet (100 mg total) by mouth every 2 (two) hours as needed for migraine. May repeat in 2 hours if headache persists or recurs.   triamcinolone  ointment 0.1 % Commonly known as: KENALOG  Apply 1 Application topically 2 (two) times daily.   valsartan  40 MG tablet Commonly known as: DIOVAN  TAKE 1 TABLET BY MOUTH EVERY DAY  Birth History: non-contributory  Developmental History: non-contributory  Past Surgical History: Past Surgical History:  Procedure Laterality Date   ADENOIDECTOMY     ANTERIOR CERVICAL DECOMPRESSION/DISCECTOMY FUSION 4 LEVELS N/A 01/20/2021   Procedure: ANTERIOR CERVICAL DECOMPRESSION/DISCECTOMY FUSION 3  LEVELS C5-T1;  Surgeon: Burnetta Aures, MD;  Location: [facility] OR;  Service: Orthopedics;  Laterality: N/A;   BACK SURGERY     FINGER ARTHROPLASTY Left 11/27/2023   Procedure: ARTHROPLASTY, FINGER;  Surgeon: Shari Easter, MD;  Location: [facility];  Service: Orthopedics;  Laterality: Left;   SACROILIAC JOINT FUSION Right 11/15/2017   Procedure: SACROILIAC JOINT FUSION;  Surgeon: Burnetta Aures, MD;  Location: [facility] OR;  Service: Orthopedics;  Laterality: Right;  120 mins   SACROILIAC JOINT FUSION Left 07/25/2018   Procedure: SACROILIAC JOINT FUSION;  Surgeon: Burnetta Aures, MD;  Location: [facility] OR;  Service: Orthopedics;  Laterality: Left;  SACROILIAC JOINT FUSION   TONSILLECTOMY     53 years old     Family History: Family History  Problem Relation Age of Onset   Heart disease Mother    Heart disease Father    Colon polyps Father    ALS Sister    Osteoarthritis Brother    Colon cancer Paternal Grandfather        dx at age 30    Healthy Son    Healthy Son    Esophageal cancer Neg Hx    Inflammatory bowel disease Neg Hx    Liver disease Neg Hx    Pancreatic cancer Neg Hx    Stomach cancer Neg Hx      Social History: [name] lives at home with his family.  He is currently on disability.  He does not smoke.  There are no pets.   Review of systems otherwise negative other than that mentioned in the HPI.    Objective:   Blood pressure 118/88, pulse 72, temperature 98.2 F (36.8 C), temperature source Temporal, resp. rate 14, height 5' 9.5 (1.765 m), weight 248 lb 9.6 oz (112.8 kg), SpO2 95%. Body mass index is 36.19 kg/m.     Physical Exam Vitals reviewed.  Constitutional:      Appearance: He is well-developed.  HENT:     Head: Normocephalic and atraumatic.     Right Ear: Tympanic membrane, ear canal and external ear normal. No drainage, swelling or tenderness. Tympanic membrane is not injected, scarred, erythematous, retracted or bulging.     Left Ear: Tympanic membrane, ear canal and external ear normal. No drainage, swelling or tenderness. Tympanic membrane is not injected, scarred, erythematous, retracted or bulging.     Nose: No nasal deformity, septal deviation, mucosal edema or rhinorrhea.     Right Turbinates: Enlarged, swollen and pale.     Left Turbinates: Enlarged, swollen and pale.     Right Sinus: No maxillary sinus tenderness or frontal sinus tenderness.     Left Sinus: No maxillary sinus tenderness or frontal sinus tenderness.     Mouth/Throat:     Mouth: Mucous membranes are not pale and not dry.     Pharynx: Uvula midline.  Eyes:     General:        Right eye: No discharge.        Left eye: No discharge.     Conjunctiva/sclera: Conjunctivae normal.     Right eye: Right conjunctiva is not injected. No chemosis.    Left eye: Left conjunctiva is not injected. No chemosis.    Pupils: Pupils are equal, round,  and reactive to light.  Cardiovascular:     Rate and Rhythm: Normal rate  and regular rhythm.     Heart sounds: Normal heart sounds.  Pulmonary:     Effort: Pulmonary effort is normal. No tachypnea, accessory muscle usage or respiratory distress.     Breath sounds: Normal breath sounds. No wheezing, rhonchi or rales.     Comments: Moving air well in all lung fields. No increased work of breathing noted.  Chest:     Chest wall: No tenderness.  Abdominal:     Tenderness: There is no abdominal tenderness. There is no guarding or rebound.  Lymphadenopathy:     Head:     Right side of head: No submandibular, tonsillar or occipital adenopathy.     Left side of head: No submandibular, tonsillar or occipital adenopathy.     Cervical: No cervical adenopathy.  Skin:    General: Skin is warm.     Capillary Refill: Capillary refill takes less than 2 seconds.     Coloration: Skin is not pale.     Findings: No abrasion, erythema, petechiae or rash. Rash is not papular, urticarial or vesicular.     Comments: No hives. Some excoriations.   Neurological:     Mental Status: He is alert.  Psychiatric:        Behavior: Behavior is cooperative.      Diagnostic studies: labs sent instead          Marty Shaggy, MD [facility] 

## 2024-03-13 NOTE — Progress Notes (Signed)
 Office Visit Note  Patient: [name]             Date of Birth: 01-03-1971           MRN: [id number]             PCP: Lupie Credit, DO Referring: Lupie Credit, DO Visit Date: 03/25/2024   Subjective:  Joint Swelling (Hands swelling and burning also turning red.) and Joint Pain (Hands, left ankle)   Discussed the use of AI scribe software for clinical note transcription with the patient, who gave verbal consent to proceed.  History of Present Illness   [name] is a 53 y.o. male here for follow up for his hand pain for which he had some previous improvement after addressing cervical spine impingement but is once again having worsening pain difficulty gripping tightly and intermittent swelling especially in the thumb. He presents with worsening joint pain and swelling.  He experiences worsening joint pain and swelling, particularly in his knuckles and ankles. The knuckle pain has persisted for over a month, intensifying since his thumb surgery. The knuckles are extremely sore, with redness and swelling. Voltaren cream is applied twice daily, but the pain remains persistent, taking about 45 minutes to subside after application.  He underwent first [facility] joint surgery on his thumb, which was previously bone on bone, resulting in significant improvement in thumb pain. However, increased reliance on his fingers has exacerbated the knuckle pain.  He also experiences significant pain in his left ankle, which he suspects may be due to a bone spur. The pain is severe enough to almost cause falls when stepping up. A boot was provided for support but is ineffective. Both ankles hurt, with the left being worse, and the pain is not related to heavy physical activity.  He has a history of immunodeficiency, with previous testing showing a failure to produce antibodies against most pneumonia strains post-vaccination. He has not taken any steroids recently, with his last guided epidural  months ago and his last azithromycin course over a month ago.  During the review of symptoms, he confirms daily pain and swelling in his knuckles, redness, and some swelling in his wrists. No recent illness but significant stress is noted. He also mentions soreness in the back of his right wrist and dry skin, described as 'rashy'.       Previous HPI 07/06/2022 [name] is a 53 y.o. male here for follow up for his hand pain for which she had some previous improvement after addressing cervical spine impingement but is once again having worsening pain difficulty gripping tightly and intermittent swelling especially in the thumb.  He does not recall any new injury to the area.  No new skin rashes.  Does not feel like numbness or tingling like his previous carpal tunnel symptoms but more painful.   Previous HPI 04/12/21 [name] is a 53 y.o. male here for follow up due to increased pain and difficulty using his left thumb with frequent locking up and severely worse right thumb pain as well. He does not recall a specific starting incident with this joint pain but now has severe pain especially with pinching and gripping motions. He did not see significantly increased swelling.   Previous HPI 02/02/21 [name] is a 53 y.o. 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 Since our visit he underwent the cervical spine decompression surgery and is still recovering from this with sutures and dermabond in place and c-collar. He notices a big improvement in strength and mobility of his hands, although still soreness and his thumb remains painful and limited in extension and grip strength on the right. He has had some ongoing urticaria symptoms along his left side and axillary area.    Previous HPI 01/19/21 [name] is a 53 y.o. male here for chronic pain and swelling of bilateral hands. He has had joint pains and inflammation since at least 2018 when he was evaluated with [facility] including rheumatology and infectious disease workups. These were broadly negative for numerous serology including RF, CCP, ANA, and B. Burgdorferi Abs. He completed treatments with ceftriaxone and doxycycline  at the time and no ongoing infectious concerns described by ID clinic. Rheumatology discussion of possible trying conventional DMARD in additional to chronic NSAID use. He has had ongoing subsequent orthopedics care with numerous degenerative and postraumatic changes in his cervical and lumbar spine and shoulders and previous SI joint fusion.  He was scheduled for anterior cervical spine decompression surgery urgency planned for tomorrow but apparently being postponed due to abnormal clotting factor test. Currently his worst complaint is increasing pain swelling and stiffness affecting his bilateral hands and wrist severely limiting his activities.  Pain is worst on the thumb and around the MCP joints right hand worse than left.  He has trouble gripping objects tightly and when he uses his hands a lot during the day will experience worsening symptoms for up to 3 days afterwards.  Occasionally there is redness overlying the affected joints when they are in exacerbation but otherwise often appears normal.  He is also developed difficulty fully extending his fingers especially the fourth and fifth fingers of his left hand.  His thumb catches in a fixed position on the right side requiring use of his other hand or an object to restore motion.   Review of Systems  Constitutional:  Positive for fatigue.  HENT:  Positive for mouth dryness. Negative for mouth sores.   Eyes:  Negative for dryness.  Respiratory:  Positive for shortness of breath.   Cardiovascular:  Negative for chest pain and palpitations.   Gastrointestinal:  Negative for blood in stool, constipation and diarrhea.  Endocrine: Negative for increased urination.  Genitourinary:  Negative for involuntary urination.  Musculoskeletal:  Positive for joint pain, gait problem, joint pain, joint swelling, myalgias, muscle weakness, morning stiffness, muscle tenderness and myalgias.  Skin:  Positive for rash and sensitivity to sunlight. Negative for color change and hair loss.  Allergic/Immunologic: Negative for susceptible to infections.  Neurological:  Positive for dizziness and headaches.  Hematological:  Negative for swollen glands.  Psychiatric/Behavioral:  Positive for depressed mood and sleep disturbance. The patient is nervous/anxious.     PMFS History:  Patient Active Problem List   Diagnosis Date Noted   Worsening headaches 02/27/2024   Facial weakness 08/22/2023   Chronic migraine w/o aura w/o status migrainosus, not intractable 08/22/2023   Rash 06/23/2023   Insomnia 03/23/2023   Bilateral carpal tunnel syndrome 02/03/2023   Sprain of anterior talofibular ligament of left ankle 09/06/2022   Chronic pain syndrome 08/18/2022   Cervico-occipital neuralgia 01/27/2022   Contact dermatitis due to plant 09/13/2021   Lateral epicondylitis, right elbow 09/10/2021   Depressed mood 05/08/2021   Pain in both  hands 05/08/2021   Pain of right thumb 04/12/2021   Other spondylosis with myelopathy, cervical region 02/02/2021   Low back pain 01/19/2021   Swelling of both hands 12/22/2020   Neck pain on left side 09/30/2020   Gait abnormality 09/30/2020   Left-sided weakness 09/01/2020   History of Lyme disease 09/01/2020   Abdominal pain, chronic, epigastric 08/18/2020   Unintentional weight loss 08/18/2020   Chronic constipation 08/18/2020   History of pancreatitis 08/18/2020   Family history of colon cancer 08/18/2020   Cervical radiculopathy 05/14/2020   Essential hypertension 01/03/2020   Vitamin D  deficiency 11/26/2019    Other fatigue 11/26/2019   Hyperglycemia 11/26/2019   Tobacco use 10/11/2019   Hyperlipemia    Cervical spondylosis 08/15/2019   Fatty liver 05/06/2019   Diverticulosis 05/06/2019   Encounter for other specified surgical aftercare 12/05/2017   S/P lumbar fusion 11/15/2017   Lumbar post-laminectomy syndrome 06/12/2017   Labral tear of shoulder, degenerative, left 02/21/2017   Pain in joint of left shoulder 01/20/2017   Elevated LFTs 01/09/2017   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   Anxiety and depression 01/03/2017    Past Medical History:  Diagnosis Date   Angio-edema    Anxiety    Disseminated Lyme disease    Dystonia    Foot drop, right 2000   Headache    Hypertension 2019   Lyme disease    Neck pain    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    Urticaria     Family History  Problem Relation Age of Onset   Heart disease Mother    Heart disease Father    Colon polyps Father    ALS Sister    Osteoarthritis Brother    Colon cancer Paternal Grandfather        dx at age 52   Healthy Son    Healthy Son    Esophageal cancer Neg Hx    Inflammatory bowel disease Neg Hx    Liver disease Neg Hx    Pancreatic cancer Neg Hx    Stomach cancer Neg Hx    Past Surgical History:  Procedure Laterality Date   ADENOIDECTOMY     ANTERIOR CERVICAL DECOMPRESSION/DISCECTOMY FUSION 4 LEVELS N/A 01/20/2021   Procedure: ANTERIOR CERVICAL DECOMPRESSION/DISCECTOMY FUSION 3  LEVELS C5-T1;  Surgeon: Burnetta Aures, MD;  Location: [facility] OR;  Service: Orthopedics;  Laterality: N/A;   BACK SURGERY     FINGER ARTHROPLASTY Left 11/27/2023   Procedure: ARTHROPLASTY, FINGER;  Surgeon: Shari Easter, MD;  Location:  SURGERY CENTER;  Service: Orthopedics;  Laterality: Left;   SACROILIAC JOINT FUSION Right 11/15/2017   Procedure: SACROILIAC JOINT FUSION;  Surgeon: Burnetta Aures, MD;  [facility] OR;  Service: Orthopedics;  Laterality: Right;  120 mins   SACROILIAC JOINT FUSION Left  07/25/2018   Procedure: SACROILIAC JOINT FUSION;  Surgeon: Burnetta Aures, MD;  Location: [facility] OR;  Service: Orthopedics;  Laterality: Left;  SACROILIAC JOINT FUSION   TONSILLECTOMY     53 years old   Social History   Social History Narrative   Lives at home with his wife.   Right-handed.   Caffeine use: two cans of Mountain Dew.   Immunization History  Administered Date(s) Administered   Influenza Split 01/09/2017   Influenza,inj,quad, With Preservative 01/09/2017   Tdap 11/16/2004     Objective: Vital Signs: BP 120/75   Pulse 66   Temp 97.9 F (36.6 C)   Resp 17   Ht 5' 9.5 (1.765 m)  Wt 254 lb (115.2 kg)   BMI 36.97 kg/m    Physical Exam Constitutional:      Appearance: He is obese.  Eyes:     Conjunctiva/sclera: Conjunctivae normal.  Cardiovascular:     Rate and Rhythm: Normal rate and regular rhythm.  Pulmonary:     Effort: Pulmonary effort is normal.     Breath sounds: Normal breath sounds.  Skin:    General: Skin is warm and dry.  Neurological:     Mental Status: He is alert.  Psychiatric:        Mood and Affect: Mood normal.      Musculoskeletal Exam:  Shoulders full ROM no tenderness or swelling Elbows full ROM no tenderness or swelling Wrists full ROM, b/l swelling Fingers full ROM, MCP joint tenderness to pressure worst 3rd MCP without palpable synovitis, 1st CMC joints postsurgical changes No paraspinal tenderness to palpation over upper and lower back Hip normal internal and external rotation without pain, no tenderness to lateral hip palpation Knees full ROM no tenderness or swelling Left ankle and midfoot swelling    Investigation: No additional findings.  Imaging: MR BRAIN W WO CONTRAST Result Date: 03/20/2024  [facility] [address] [place], [place] [address] [phone] NEUROIMAGING REPORT STUDY DATE: 03/20/2024 PATIENT NAME: OSMIN WELZ Chen DOB: 06/22/1970 MRN: [id number] ORDERING CLINICIAN: Dr. Onita  CLINICAL HISTORY: 53 year old patient being evaluated for pain in both hands and chronic migraine COMPARISON FILMS: MRI scan of the brain with and without contrast 09/05/2020 EXAM: MRI brain with and without contrast TECHNIQUE: Sagittal T1, axial T1, T2, FLAIR, DWI, ADC map, SWI, coronal T2 and postcontrast axial and coronal T1 images were obtained through the brain CONTRAST: 10 mL IV gadopiclenol IMAGING SITE: [place] imaging FINDINGS: The brain parenchyma shows few scattered bilateral supratentorial T2/FLAIR nonspecific white matter hyperintensities which may be seen in a variety of conditions including migraine headaches, remote trauma, small vessel disease, autoimmune, infectious or intermittent conditions.  No other structural lesion, tumor or infarct is noted.  Diffusion weighted imaging is negative for acute ischemia.  Vestibular images do not show significant microhemorrhages.  The subarachnoid space and ventricular system appear normal.  Cortical sulci and gyri show normal appearance.  Extra-axial brain structures appear normal.  Calvarium shows no abnormalities.  Orbits appear unremarkable.  Paranasal sinuses show mild chronic inflammatory changes.  The pituitary gland and cerebellar tonsils appear normal.  Visualized portion of the cervical spine shows no significant abnormalities.  Flow-voids of large vessels are intact and circulation appear to be patent.  Postcontrast images did not result in abnormal areas of enhancement.   MRI scan of the brain with and without contrast showing nonspecific bilateral supratentorial white matter hyperintensities with the differential discussed above.  No enhancing lesions are noted.  Overall no significant change compared with previous MRI from 09/05/2020. INTERPRETING PHYSICIAN: EATHER POPP, MD Certified in  Neuroimaging by American Society of Neuroimaging and United Council for Neurological Subspecialities   Recent Labs: Lab Results  Component Value Date    WBC 10.2 03/12/2024   HGB 14.6 03/12/2024   PLT 235 03/12/2024   NA 141 02/20/2024   K 4.3 02/20/2024   CL 104 02/20/2024   CO2 21 02/20/2024   GLUCOSE 103 (H) 02/20/2024   BUN 11 02/20/2024   CREATININE 0.94 02/20/2024   BILITOT 0.3 02/20/2024   ALKPHOS 75 02/20/2024   AST 18 02/20/2024   ALT 13 02/20/2024   PROT 7.2 02/20/2024  ALBUMIN  4.5 02/20/2024   CALCIUM  9.3 02/20/2024   GFRAA 112 06/08/2020    Speciality Comments: No specialty comments available.  Procedures:  No procedures performed Allergies: Pregabalin  and Sulfa antibiotics   Assessment / Plan:     Visit Diagnoses: Swelling of both hands - Plan: Sedimentation rate, C-reactive protein, Rheumatoid factor, predniSONE  (DELTASONE ) 10 MG tablet Seronegative inflammatory arthritis with polyarticular flare Severe polyarticular flare with significant pain and swelling in knuckles, wrists, and ankles. Hx of treated Lyme questionable post-treatment inflammatory syndrome. Previous labs nonspecific. Condition affects daily activities. - Order blood tests to recheck inflammatory markers. - Prescribe steroid taper to reduce inflammation down over 12 days. - Consider starting hydroxychloroquine pending blood test results.  Right ankle pain and tendinitis Chronic right ankle pain with suspicion of bone spur, exacerbated by movement. Likely due to tendinitis and joint inflammation. - Evaluate ankle pain in context of inflammatory arthritis flare.  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 Neutrophil count normal. Complicates treatment options for inflammatory arthritis.       Orders: Orders Placed This Encounter  Procedures   Sedimentation rate   C-reactive protein   Rheumatoid factor   Meds ordered this encounter  Medications   predniSONE  (DELTASONE ) 10 MG tablet    Sig: Take 3 tablets (30 mg total) by mouth  daily with breakfast for 4 days, THEN 2 tablets (20 mg total) daily with breakfast for 4 days, THEN 1 tablet (10 mg total) daily with breakfast for 4 days.    Dispense:  24 tablet    Refill:  0     Follow-Up Instructions: Return in about 3 months (around 06/25/2024) for ?RA GC taper/HCQ trial f/u 2-28mos.   Lonni LELON Ester, MD  Note - This record has been created using [employer].  Chart creation errors have been sought, but may not always  have been located. Such creation errors do not reflect on  the standard of medical care.

## 2024-03-14 DIAGNOSIS — M25542 Pain in joints of left hand: Secondary | ICD-10-CM | POA: Diagnosis not present

## 2024-03-15 ENCOUNTER — Encounter: Payer: Self-pay | Admitting: Allergy & Immunology

## 2024-03-15 DIAGNOSIS — M25532 Pain in left wrist: Secondary | ICD-10-CM | POA: Diagnosis not present

## 2024-03-20 ENCOUNTER — Ambulatory Visit
Admission: RE | Admit: 2024-03-20 | Discharge: 2024-03-20 | Disposition: A | Source: Ambulatory Visit | Attending: Neurology | Admitting: Neurology

## 2024-03-20 DIAGNOSIS — M79641 Pain in right hand: Secondary | ICD-10-CM | POA: Diagnosis not present

## 2024-03-20 DIAGNOSIS — M79642 Pain in left hand: Secondary | ICD-10-CM | POA: Diagnosis not present

## 2024-03-20 DIAGNOSIS — G43709 Chronic migraine without aura, not intractable, without status migrainosus: Secondary | ICD-10-CM

## 2024-03-20 DIAGNOSIS — R519 Headache, unspecified: Secondary | ICD-10-CM

## 2024-03-20 LAB — STREP PNEUMONIAE 23 SEROTYPES IGG
Pneumo Ab Type 1*: <0.1 ug/mL — ABNORMAL LOW (ref >1.3)
Pneumo Ab Type 12 (12F)*: 0.5 ug/mL — ABNORMAL LOW (ref >1.3)
Pneumo Ab Type 14*: >30.6 ug/mL (ref >1.3)
Pneumo Ab Type 17 (17F)*: <0.1 ug/mL — ABNORMAL LOW (ref >1.3)
Pneumo Ab Type 19 (19F)*: 0.8 ug/mL — ABNORMAL LOW (ref >1.3)
Pneumo Ab Type 2*: <0.2 ug/mL — ABNORMAL LOW (ref >1.3)
Pneumo Ab Type 20*: 0.7 ug/mL — ABNORMAL LOW (ref >1.3)
Pneumo Ab Type 22 (22F)*: <0.1 ug/mL — ABNORMAL LOW (ref >1.3)
Pneumo Ab Type 23 (23F)*: <0.1 ug/mL — ABNORMAL LOW (ref >1.3)
Pneumo Ab Type 26 (6B)*: 0.2 ug/mL — ABNORMAL LOW (ref >1.3)
Pneumo Ab Type 3*: 0.4 ug/mL — ABNORMAL LOW (ref >1.3)
Pneumo Ab Type 34 (10A)*: 0.2 ug/mL — ABNORMAL LOW (ref >1.3)
Pneumo Ab Type 4*: <0.1 ug/mL — ABNORMAL LOW (ref >1.3)
Pneumo Ab Type 43 (11A)*: 0.9 ug/mL — ABNORMAL LOW (ref >1.3)
Pneumo Ab Type 5*: <0.1 ug/mL — ABNORMAL LOW (ref >1.3)
Pneumo Ab Type 51 (7F)*: 0.6 ug/mL — ABNORMAL LOW (ref >1.3)
Pneumo Ab Type 54 (15B)*: 8.6 ug/mL (ref >1.3)
Pneumo Ab Type 56 (18C)*: <0.1 ug/mL — ABNORMAL LOW (ref >1.3)
Pneumo Ab Type 57 (19A)*: 0.9 ug/mL — ABNORMAL LOW (ref >1.3)
Pneumo Ab Type 68 (9V)*: 1.6 ug/mL (ref >1.3)
Pneumo Ab Type 70 (33F)*: 1.3 ug/mL — ABNORMAL LOW (ref >1.3)
Pneumo Ab Type 8*: <0.3 ug/mL — ABNORMAL LOW (ref >1.3)
Pneumo Ab Type 9 (9N)*: 0.2 ug/mL — ABNORMAL LOW (ref >1.3)

## 2024-03-20 LAB — CBC WITH DIFF/PLATELET
Basophils Absolute: 0 x10E3/uL (ref 0.0–0.2)
Basos: 0 %
EOS (ABSOLUTE): 0.1 x10E3/uL (ref 0.0–0.4)
Eos: 1 %
Hematocrit: 44.9 % (ref 37.5–51.0)
Hemoglobin: 14.6 g/dL (ref 13.0–17.7)
Immature Grans (Abs): 0 x10E3/uL (ref 0.0–0.1)
Immature Granulocytes: 0 %
Lymphocytes Absolute: 2.8 x10E3/uL (ref 0.7–3.1)
Lymphs: 28 %
MCH: 30 pg (ref 26.6–33.0)
MCHC: 32.5 g/dL (ref 31.5–35.7)
MCV: 92 fL (ref 79–97)
Monocytes Absolute: 0.7 x10E3/uL (ref 0.1–0.9)
Monocytes: 7 %
Neutrophils Absolute: 6.5 x10E3/uL (ref 1.4–7.0)
Neutrophils: 64 %
Platelets: 235 x10E3/uL (ref 150–450)
RBC: 4.86 x10E6/uL (ref 4.14–5.80)
RDW: 12.6 % (ref 11.6–15.4)
WBC: 10.2 x10E3/uL (ref 3.4–10.8)

## 2024-03-20 LAB — DIPHTHERIA / TETANUS ANTIBODY PANEL
Diphtheria Ab: <0.1 [IU]/mL — ABNORMAL LOW (ref <0.10)
Tetanus Ab, IgG: 0.47 [IU]/mL (ref <0.10)

## 2024-03-20 LAB — COMPLEMENT, TOTAL: Compl, Total (CH50): >60 U/mL (ref >41)

## 2024-03-20 LAB — IGG, IGA, IGM
IgA/Immunoglobulin A, Serum: 208 mg/dL (ref 90–386)
IgG (Immunoglobin G), Serum: 993 mg/dL (ref 603–1613)
IgM (Immunoglobulin M), Srm: 193 mg/dL — ABNORMAL HIGH (ref 20–172)

## 2024-03-20 MED ORDER — GADOPICLENOL 0.5 MMOL/ML IV SOLN
10.0000 mL | Freq: Once | INTRAVENOUS | Status: AC | PRN
  Administered 2024-03-20: 10 mL via INTRAVENOUS

## 2024-03-21 DIAGNOSIS — M1812 Unilateral primary osteoarthritis of first carpometacarpal joint, left hand: Secondary | ICD-10-CM | POA: Diagnosis not present

## 2024-03-21 DIAGNOSIS — M79645 Pain in left finger(s): Secondary | ICD-10-CM | POA: Diagnosis not present

## 2024-03-21 DIAGNOSIS — M25542 Pain in joints of left hand: Secondary | ICD-10-CM | POA: Diagnosis not present

## 2024-03-22 ENCOUNTER — Telehealth: Payer: Self-pay | Admitting: Allergy & Immunology

## 2024-03-22 ENCOUNTER — Ambulatory Visit: Payer: Self-pay | Admitting: Allergy & Immunology

## 2024-03-22 NOTE — Telephone Encounter (Signed)
 Sent message regarding [facility] genetic testing results.

## 2024-03-25 ENCOUNTER — Encounter: Payer: Self-pay | Admitting: Internal Medicine

## 2024-03-25 ENCOUNTER — Ambulatory Visit: Attending: Internal Medicine | Admitting: Internal Medicine

## 2024-03-25 VITALS — BP 120/75 | HR 66 | Temp 97.9°F | Resp 17 | Ht 69.5 in | Wt 254.0 lb

## 2024-03-25 DIAGNOSIS — Z8619 Personal history of other infectious and parasitic diseases: Secondary | ICD-10-CM | POA: Diagnosis not present

## 2024-03-25 DIAGNOSIS — M7989 Other specified soft tissue disorders: Secondary | ICD-10-CM | POA: Diagnosis not present

## 2024-03-25 MED ORDER — PREDNISONE 10 MG PO TABS: ORAL_TABLET | ORAL | 0 refills | Status: AC

## 2024-03-26 ENCOUNTER — Ambulatory Visit: Payer: Self-pay | Admitting: Neurology

## 2024-03-26 LAB — SEDIMENTATION RATE: Sed Rate: 22 mm/h — ABNORMAL HIGH (ref 0–20)

## 2024-03-26 LAB — RHEUMATOID FACTOR: Rheumatoid fact SerPl-aCnc: <10 [IU]/mL (ref <14)

## 2024-03-26 LAB — C-REACTIVE PROTEIN: CRP: 9.7 mg/L — ABNORMAL HIGH (ref <8.0)

## 2024-04-03 DIAGNOSIS — M25542 Pain in joints of left hand: Secondary | ICD-10-CM | POA: Diagnosis not present

## 2024-04-15 ENCOUNTER — Encounter: Payer: Self-pay | Admitting: Allergy & Immunology

## 2024-04-16 NOTE — Patient Instructions (Incomplete)
 1. 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 - Information provided on Xolair. - Previous workup noted only elevated inflammatory markers.  - Continue with the suppressive dosing of antihistamines.   - In the meantime, continue suppressive dosing of antihistamines:     - Morning: Allegra  (fexofenadine ) 180mg  (one tablet) + Pepcid  (famotidine ) 20mg     - Noon: Allegra  (fexofenadine ) 180mg  (one tablet) + Pepcid  (famotidine ) 20mg     - Evening: Allegra  (fexofenadine ) 180mg  (one tablet) + Pepcid  (famotidine ) 20mg  + Singulair  (montelukast ) 10mg  - You can change this dosing at home, decreasing the dose as needed or increasing the dosing as needed.   2. Recurrent osteomyelitis  - We are going to get some labs to look at your immune system.  - We are also getting some immune genetic testing to see if you have an immunodeficiency.    3. No follow-ups on file. You can have the follow up appointment with Dr. Iva or a Nurse Practicioner (our Nurse Practitioners are excellent and always have Physician oversight!).    Please inform us  of any Emergency Department visits, hospitalizations, or changes in symptoms. Call us  before going to the ED for breathing or allergy symptoms since we might be able to fit you in for a sick visit. Feel free to contact us  anytime with any questions, problems, or concerns.  It was a pleasure to see you again today!  Websites that have reliable patient information: 1. American Academy of Asthma, Allergy, and Immunology: www.aaaai.org 2. Food Allergy Research and Education (FARE): foodallergy.org 3. Mothers of Asthmatics: http://www.asthmacommunitynetwork.org 4. American College of Allergy, Asthma, and Immunology: www.acaai.org      "Like" us  on [employer] and Instagram for our latest updates!      A healthy democracy works best when [employer] participate! Make  sure you are registered to vote! If you have moved or changed any of your contact information, you will need to get this updated before voting! Scan the QR codes below to learn more!

## 2024-04-16 NOTE — Telephone Encounter (Signed)
 The patient has been scheduled to see [name] tomorrow.

## 2024-04-16 NOTE — Telephone Encounter (Signed)
 Yes, I have  openings today or tomorrow

## 2024-04-17 ENCOUNTER — Other Ambulatory Visit: Payer: Self-pay

## 2024-04-17 ENCOUNTER — Encounter: Payer: Self-pay | Admitting: Family

## 2024-04-17 ENCOUNTER — Ambulatory Visit: Admitting: Family

## 2024-04-17 VITALS — BP 110/60 | HR 74 | Temp 98.7°F

## 2024-04-17 DIAGNOSIS — L501 Idiopathic urticaria: Secondary | ICD-10-CM

## 2024-04-17 DIAGNOSIS — B999 Unspecified infectious disease: Secondary | ICD-10-CM

## 2024-04-17 DIAGNOSIS — M8630 Chronic multifocal osteomyelitis, unspecified site: Secondary | ICD-10-CM | POA: Diagnosis not present

## 2024-04-17 NOTE — Progress Notes (Signed)
 [address] [place] [address] Dept: [phone]  FOLLOW UP NOTE  Patient ID: [name], male    DOB: Jan 21, 1971  Age: 53 y.o. MRN: [id number] Date of Office Visit: 04/17/2024  Assessment  Chief Complaint: Follow-up (No change In issues), Rash, Pruritus, and Pain (Muscles )  HPI EMETERIO BALKE Chen is a 53 year old male who presents today for an acute visit of hives and muscle aches.  He was last seen on March 12, 2024 by Dr. Iva for chronic idiopathic urticaria with history of chronic Lyme disease and recurrent osteomyelitis.  He denies any new diagnosis or surgery since his last office visit.  He reports that for the past 6 days he has had hives on his abdomen, legs, and armpit region.  Some of the hives are itching and some of her Manatt.  The hives on his abdomen are not itchy.  The hives on his armpits in the creases of his legs are itchy.  He also started having muscle pain in his forearm and then his chest, shoulders, back and legs.  The muscle pain started 3 to 4 days ago.  It is like he has a lot of lactic acid and he feels like he has ran a 10K.  He reports that every time he has the rash it causes the pain in his muscles.  He is currently taking Allegra  180 mg 3 times a day, Pepcid  3 times a day, and Singulair  at night.  He is interested in starting something such as Xolair to help decrease the amount of medications he has taking.  He reports that he has had hives for 4-1/2 years now.   Recurrent infections/recurrent osteomyelitis: He has not yet received his Pneumovax or Tdap, but plans to let his primary care physician know.  His previous strep pneumonia 23 serotypes from March 12, 2024 were only protective to 3 out of the 23 serotypes and his diphtheria will not protective.   Drug Allergies:  Allergies  Allergen Reactions   Pregabalin  Hives, Shortness Of Breath and Other (See Comments)    **ATTEMPTING TO TAKE THIS MEDICATION AGAIN** Insomnia Mood  alter   Sulfa Antibiotics Anaphylaxis and Other (See Comments)    Review of Systems: Negative except as per HPI   Physical Exam: BP 110/60   Pulse 74   Temp 98.7 F (37.1 C)   SpO2 95%    Physical Exam Constitutional:      Appearance: Normal appearance.  HENT:     Head: Normocephalic and atraumatic.     Comments: Pharynx normal, eyes normal, ears normal, nose normal    Right Ear: Tympanic membrane, ear canal and external ear normal.     Left Ear: Tympanic membrane, ear canal and external ear normal.     Nose: Nose normal.     Mouth/Throat:     Mouth: Mucous membranes are moist.     Pharynx: Oropharynx is clear.  Eyes:     Conjunctiva/sclera: Conjunctivae normal.  Cardiovascular:     Rate and Rhythm: Regular rhythm.     Heart sounds: Normal heart sounds.  Pulmonary:     Effort: Pulmonary effort is normal.     Breath sounds: Normal breath sounds.     Comments: Lungs clear to auscultation Musculoskeletal:     Cervical back: Neck supple.  Skin:    General: Skin is warm.     Comments: Urticarial lesions noted on abdomen.  He reports urticarial lesions in groin region and axilla  region  Neurological:     Mental Status: He is alert and oriented to person, place, and time.  Psychiatric:        Mood and Affect: Mood normal.        Behavior: Behavior normal.        Thought Content: Thought content normal.        Judgment: Judgment normal.     Diagnostics:  None  Assessment and Plan: 1. Chronic idiopathic urticaria   2. Recurrent infections   3. Chronic recurrent multifocal osteomyelitis (HCC)     No orders of the defined types were placed in this encounter.   Patient Instructions  1. Chronic idiopathic urticaria - with history of chronic Lyme disease - Continue to consider Xolair. Also, can consider Rhapsido. You can look this up online. We do not have any brochures on this medication. Please let us  know which option you would like to start. - Previous workup  noted only elevated inflammatory markers.  - Continue with the suppressive dosing of antihistamines.   - In the meantime, continue suppressive dosing of antihistamines:     - Morning: Allegra  (fexofenadine ) 180mg  (one tablet) + Pepcid  (famotidine ) 20mg     - Noon: Allegra  (fexofenadine ) 180mg  (one tablet) + Pepcid  (famotidine ) 20mg     - Evening: Allegra  (fexofenadine ) 180mg  (one tablet) + Pepcid  (famotidine ) 20mg  + Singulair  (montelukast ) 10mg  - You can change this dosing at home, decreasing the dose as needed or increasing the dosing as needed.   2. Recurrent osteomyelitis  - Recommend getting Pneumovax or Prevnar 20 due only being protective to 3 other 23 serotypes.  Once you receive the Pneumovax or Prevnar 20 please call our office and we will have you get repeat lab work in 4 to 6 weeks.  Also, if you could check with your primary care physician to see when you last had your last Tdap    3.Follow up in 2-3 months or sooner if needed  Please inform us  of any Emergency Department visits, hospitalizations, or changes in symptoms. Call us  before going to the ED for breathing or allergy symptoms since we might be able to fit you in for a sick visit. Feel free to contact us  anytime with any questions, problems, or concerns.   Websites that have reliable patient information: 1. American Academy of Asthma, Allergy, and Immunology: www.aaaai.org 2. Food Allergy Research and Education (FARE): foodallergy.org 3. Mothers of Asthmatics: http://www.asthmacommunitynetwork.org 4. American College of Allergy, Asthma, and Immunology: www.acaai.org                Return in about 2 months (around 06/17/2024).    Thank you for the opportunity to care for this patient.  Please do not hesitate to contact me with questions.  Wanda Craze, FNP [facility] 

## 2024-04-23 ENCOUNTER — Ambulatory Visit: Admitting: Student

## 2024-04-23 ENCOUNTER — Encounter: Payer: Self-pay | Admitting: Student

## 2024-04-23 VITALS — BP 123/72 | HR 90 | Ht 69.5 in | Wt 253.6 lb

## 2024-04-23 DIAGNOSIS — Z23 Encounter for immunization: Secondary | ICD-10-CM

## 2024-04-23 DIAGNOSIS — R4589 Other symptoms and signs involving emotional state: Secondary | ICD-10-CM | POA: Diagnosis not present

## 2024-04-23 NOTE — Progress Notes (Unsigned)
    SUBJECTIVE:   CHIEF COMPLAINT / HPI:   [name] is a 53 y.o. male presenting for follow up for vaccinations.  Continues to struggle with his mental health. Reports the Zoloft  seems to help with his symptoms. Denies active SI.   PERTINENT  PMH / PSH: reviewed and updated.  OBJECTIVE:   BP 123/72   Pulse 90   Ht 5' 9.5 (1.765 m)   Wt 253 lb 9.6 oz (115 kg)   SpO2 98%   BMI 36.91 kg/m   Well-appearing, no acute distress Cardio: Regular rate, regular rhythm, no murmurs on exam. Pulm: Clear, no wheezing, no crackles. No increased work of breathing Abdominal: bowel sounds present, soft, non-tender, non-distended Extremities: no peripheral edema   ASSESSMENT/PLAN:   Assessment & Plan Depressed mood Does not want additional medications at this time. Discussed starting therapy outpatient. Resources provided.  Encounter for immunization Received Tdap and pneumococcal vaccines today    Discussed colon cancer screening. Patient declined at this time.   Damien Pinal, DO [facility]

## 2024-04-23 NOTE — Patient Instructions (Addendum)
 It was great to see you today!   Follow up as needed  No future appointments.  Please arrive 15 minutes before your appointment to ensure smooth check in process.  If you are more than 15 minutes late, you may be asked to reschedule.   Please bring a list of your medications with you to all appointments.   Please call the clinic at [phone] if your symptoms worsen or you have any concerns.  Thank you for allowing me to participate in your care, Dr. Damien Pinal [facility]

## 2024-04-24 ENCOUNTER — Telehealth: Payer: Self-pay | Admitting: *Deleted

## 2024-04-24 ENCOUNTER — Telehealth: Payer: Self-pay

## 2024-04-24 DIAGNOSIS — L501 Idiopathic urticaria: Secondary | ICD-10-CM

## 2024-04-24 NOTE — Telephone Encounter (Signed)
 Patient to initiate Xolair 300mg  every 4 weeks for chronic idiopathic urticaria
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
## 2024-04-24 NOTE — Assessment & Plan Note (Addendum)
 Does not want additional medications at this time. Discussed starting therapy outpatient. Resources provided.

## 2024-04-29 ENCOUNTER — Telehealth: Payer: Self-pay

## 2024-04-29 ENCOUNTER — Other Ambulatory Visit: Payer: Self-pay | Admitting: *Deleted

## 2024-04-29 ENCOUNTER — Other Ambulatory Visit (HOSPITAL_COMMUNITY): Payer: Self-pay

## 2024-04-29 NOTE — Telephone Encounter (Signed)
*  AA  Pharmacy Patient Advocate Encounter   Received notification from Pt Calls Messages that prior authorization for Xolair 300MG /2ML syringes   is required/requested.   Insurance verification completed.   The patient is insured through [facility] MEDICAID.   Per test claim: PA required; PA submitted to above mentioned insurance via Latent Key/confirmation #/EOC [id number] Status is pending

## 2024-04-29 NOTE — Telephone Encounter (Signed)
$  4.00 copay- may fill through [employer]

## 2024-04-29 NOTE — Telephone Encounter (Signed)
 Your request has been approved Approved. This drug has been approved. Approved quantity: 2 mL per 28 day(s). You may fill up to a 34 day supply at a retail pharmacy. You may fill up to a 90 day supply for maintenance drugs, please refer to the formulary for details. Please call the pharmacy to process your prescription claim. Authorization Expiration12/05/2024

## 2024-04-30 ENCOUNTER — Other Ambulatory Visit: Payer: Self-pay

## 2024-04-30 ENCOUNTER — Ambulatory Visit: Attending: Allergy & Immunology

## 2024-04-30 ENCOUNTER — Other Ambulatory Visit (HOSPITAL_COMMUNITY): Payer: Self-pay

## 2024-04-30 DIAGNOSIS — L501 Idiopathic urticaria: Secondary | ICD-10-CM

## 2024-04-30 DIAGNOSIS — Z79899 Other long term (current) drug therapy: Secondary | ICD-10-CM

## 2024-04-30 MED ORDER — PROPRANOLOL HCL ER 80 MG PO CP24: 80.0000 mg | ORAL_CAPSULE | Freq: Every day | ORAL | 2 refills | Status: AC

## 2024-04-30 MED ORDER — OMALIZUMAB 300 MG/2  ML ~~LOC~~ SOSY
300.0000 mg | PREFILLED_SYRINGE | SUBCUTANEOUS | 0 refills | Status: DC
  Filled 2024-04-30: qty 2, 28d supply, fill #0

## 2024-04-30 NOTE — Progress Notes (Signed)
 Initial counseling documented in telephone visit on 04/30/24.

## 2024-04-30 NOTE — Progress Notes (Signed)
 Specialty Pharmacy Initial Fill Coordination Note  [name] is a 53 y.o. male contacted today regarding initial fill of specialty medication(s) Omalizumab [name]   Patient requested Courier to Provider Office   Delivery date: 05/01/24   Verified address: [facility] - [address], [place] [address]   Medication will be filled on: 04/30/24   Patient is aware of $4 copayment.

## 2024-04-30 NOTE — Progress Notes (Signed)
 [facility] - [facility]  Referring Provider: Wanda Craze  Virtual Visit via Telephone Note  I connected with [name] on 04/30/24 at  1:00 PM EST by telephone and verified that I am speaking with the correct person using two identifiers.  Location: Patient: home Provider: office   I discussed the limitations, risks, security and privacy concerns of performing an evaluation and management service by telephone and the availability of in person appointments. I also discussed with the patient that there may be a patient responsible charge related to this service. The patient expressed understanding and agreed to proceed.  HPI: [name] is a 53 y.o. male who presents to the pharmacotherapy clinic via telephone for new start Xolair  initial education. Last OV with Wanda Craze was on 04/17/24.   Indication: Chronic Idiopathic Urticaria Dosing: 300mg  every 28 days   Patient Active Problem List   Diagnosis Date Noted   Facial weakness 08/22/2023   Chronic migraine w/o aura w/o status migrainosus, not intractable 08/22/2023   Rash 06/23/2023   Insomnia 03/23/2023   Bilateral carpal tunnel syndrome 02/03/2023   Sprain of anterior talofibular ligament of left ankle 09/06/2022   Chronic pain syndrome 08/18/2022   Cervico-occipital neuralgia 01/27/2022   Lateral epicondylitis, right elbow 09/10/2021   Depressed mood 05/08/2021   Pain in both hands 05/08/2021   Pain of right thumb 04/12/2021   Other spondylosis with myelopathy, cervical region 02/02/2021   Low back pain 01/19/2021   Neck pain on left side 09/30/2020   Gait abnormality 09/30/2020   Left-sided weakness 09/01/2020   History of Lyme disease 09/01/2020   Unintentional weight loss 08/18/2020   Chronic constipation 08/18/2020   History of pancreatitis 08/18/2020   Family history of colon cancer 08/18/2020   Cervical radiculopathy 05/14/2020   Essential hypertension  01/03/2020   Vitamin D  deficiency 11/26/2019   Hyperglycemia 11/26/2019   Tobacco use 10/11/2019   Hyperlipemia    Cervical spondylosis 08/15/2019   Fatty liver 05/06/2019   Diverticulosis 05/06/2019   S/P lumbar fusion 11/15/2017   Lumbar post-laminectomy syndrome 06/12/2017   Labral tear of shoulder, degenerative, left 02/21/2017   Pain in joint of left shoulder 01/20/2017   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   FAMOTIDINE  (PEPCID ) 20 MG TABLET    TAKE 1 TABLET(20 MG) BY MOUTH TWICE DAILY   FLUTICASONE  (FLONASE ) 50 MCG/ACT NASAL SPRAY    SHAKE LIQUID AND USE 2 SPRAYS IN EACH NOSTRIL DAILY STRENGTH: 50 MCG/ACT   GABAPENTIN  (NEURONTIN ) 300 MG CAPSULE    Take 2 capsules (600 mg total) by mouth 3 (three) times daily.   HYDROXYZINE  (ATARAX ) 10 MG TABLET    Take 1 tablet (10 mg total) by mouth 3 (three) times daily as needed.   MULTIPLE VITAMINS-MINERALS (MENS MULTIVITAMIN) CHEW    Chew 1 tablet by mouth daily.   NALOXONE (NARCAN) NASAL SPRAY 4 MG/0.1 ML    1 spray in one nostril x1.  may repeat dose q 3 min until pt awakens or EMS arrives   ONDANSETRON  (ZOFRAN -ODT) 4 MG DISINTEGRATING TABLET    Take 1 tablet (4 mg total) by mouth every 8 (eight) hours as needed for nausea or vomiting.   OXYCODONE  HCL 10 MG TABS    Take 10  mg by mouth 3 (three) times daily.   PROPRANOLOL  ER (INDERAL  LA) 80 MG 24 HR CAPSULE    TAKE 1 CAPSULE BY MOUTH EVERY DAY   ROSUVASTATIN  (CRESTOR ) 20 MG TABLET    TAKE 1 TABLET(20 MG) BY MOUTH DAILY STRENGTH: 20 MG   SERTRALINE  (ZOLOFT ) 100 MG TABLET    TAKE 1 TABLET DAILY   SUMATRIPTAN  (IMITREX ) 100 MG TABLET    Take 1 tablet (100 mg total) by mouth every 2 (two) hours as needed for migraine. May repeat in 2 hours if headache persists or recurs.   TRIAMCINOLONE  OINTMENT (KENALOG )  0.1 %    Apply 1 Application topically 2 (two) times daily.   VALSARTAN  (DIOVAN ) 40 MG TABLET    TAKE 1 TABLET BY MOUTH EVERY DAY  Modified Medications   No medications on file  Discontinued Medications   No medications on file    Allergies: Allergies  Allergen Reactions   Pregabalin  Hives, Shortness Of Breath and Other (See Comments)    **ATTEMPTING TO TAKE THIS MEDICATION AGAIN** Insomnia Mood alter   Sulfa Antibiotics Anaphylaxis and Other (See Comments)    Past Medical History: Past Medical History:  Diagnosis Date   Angio-edema    Anxiety    Disseminated Lyme disease    Dystonia    Foot drop, right 2000   Headache    Hypertension 2019   Lyme disease    Neck pain    Rocky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    Urticaria     Social History: Social History   Socioeconomic History   Marital status: Legally Separated    Spouse name: Not on file   Number of children: 2   Years of education: college   Highest education level: Associate degree: occupational, scientist, product/process development, or vocational program  Occupational History   Occupation: currently seeking disability  Tobacco Use   Smoking status: Every Day    Current packs/day: 0.50    Average packs/day: 0.5 packs/day for 30.0 years (15.0 ttl pk-yrs)    Types: Cigarettes    Passive exposure: Past   Smokeless tobacco: Never   Tobacco comments:    08/22/2023- pt states he smokes 1/2 pack a day   Vaping Use   Vaping status: Never Used  Substance and Sexual Activity   Alcohol use: Not Currently    Comment: stopped 2018   Drug use: No   Sexual activity: Yes  Other Topics Concern   Not on file  Social History Narrative   Lives at home with his wife.   Right-handed.   Caffeine use: two cans of Mountain Dew.   Social Drivers of Health   Financial Resource Strain: Medium Risk (06/22/2023)   Overall Financial Resource Strain (CARDIA)    Difficulty of Paying Living Expenses: Somewhat hard  Food Insecurity: Food Insecurity Present  (06/22/2023)   Hunger Vital Sign    Worried About Running Out of Food in the Last Year: Sometimes true    Ran Out of Food in the Last Year: Sometimes true  Transportation Needs: Unmet Transportation Needs (06/22/2023)   PRAPARE - Administrator, Civil Service (Medical): Yes    Lack of Transportation (Non-Medical): Yes  Physical Activity: Unknown (06/22/2023)   Exercise Vital Sign    Days of Exercise per Week: 0 days    Minutes of Exercise per Session: Not on file  Stress: Stress Concern Present (06/22/2023)   [employer] of Occupational Health - Occupational Stress Questionnaire    Feeling of Stress :  To some extent  Social Connections: Socially Isolated (06/22/2023)   Social Connection and Isolation Panel    Frequency of Communication with Friends and Family: Twice a week    Frequency of Social Gatherings with Friends and Family: Never    Attends Religious Services: Never    Database Administrator or Organizations: No    Attends Engineer, Structural: Not on file    Marital Status: Divorced       Assessment/Plan: 1. Patient prescribed Xolair for chronic urticaria. Reviewed the medication with the patient, including the following:   Goals of therapy: Mechanism: IgG monoclonal antibody (recombinant DNA derived) which inhibits IgE binding to the high-affinity IgE receptor on mast cells and basophils.  Response to therapy: may take 3 to 6 months to determine efficacy. Discussed that patients generally feel improvement sooner than 3 months.  Side effects: anaphylaxis (0.1%) (black boxed warning), injection site reaction (45%), arthralgia (2.9% to 8%), headache (3% to 15%)   Plan:  - Patient scheduled for new start Xolair appointment at Allergy and Asthma clinic on 05/02/24.  - Rx will be triaged to [facility] for courier to Allergy and Asthma clinic.   I discussed the assessment and treatment plan with the patient. The patient was provided an  opportunity to ask questions and all were answered. The patient agreed with the plan and demonstrated an understanding of the instructions.   The patient was advised to call back or seek an in-person evaluation if the symptoms worsen or if the condition fails to improve as anticipated.  I provided 20 minutes of non-face-to-face time during this encounter.  Patient verbalizes understanding and agreement with plan.   Delon Brow, PharmD, CSP, AAHIVP, CPP Clinical Pharmacist Practitioner - Medication Therapy Disease Management/Specialty Pharmacy Services 04/30/2024, 1:33 PM

## 2024-05-02 ENCOUNTER — Ambulatory Visit: Admitting: Family Medicine

## 2024-05-02 ENCOUNTER — Other Ambulatory Visit: Payer: Self-pay

## 2024-05-02 ENCOUNTER — Encounter: Payer: Self-pay | Admitting: Family Medicine

## 2024-05-02 ENCOUNTER — Ambulatory Visit: Payer: Self-pay | Admitting: *Deleted

## 2024-05-02 VITALS — BP 100/80 | HR 62 | Temp 98.2°F

## 2024-05-02 DIAGNOSIS — L501 Idiopathic urticaria: Secondary | ICD-10-CM

## 2024-05-02 DIAGNOSIS — B999 Unspecified infectious disease: Secondary | ICD-10-CM | POA: Insufficient documentation

## 2024-05-02 DIAGNOSIS — M8630 Chronic multifocal osteomyelitis, unspecified site: Secondary | ICD-10-CM | POA: Insufficient documentation

## 2024-05-02 MED ORDER — EPINEPHRINE 0.3 MG/0.3ML IJ SOAJ: 0.3000 mg | Freq: Once | INTRAMUSCULAR | 2 refills | Status: AC

## 2024-05-02 MED ORDER — EPINEPHRINE 0.3 MG/0.3ML IJ SOAJ: 0.3000 mg | INTRAMUSCULAR | 1 refills | Status: AC | PRN

## 2024-05-02 MED ORDER — OMALIZUMAB 300 MG/2  ML ~~LOC~~ SOSY
300.0000 mg | PREFILLED_SYRINGE | Freq: Once | SUBCUTANEOUS | Status: AC
  Administered 2024-05-02: 300 mg via SUBCUTANEOUS

## 2024-05-02 NOTE — Patient Instructions (Addendum)
 Chronic urticaria Continue either cetirizine 10 mg or fexofenadine  180 mg 2 tablets in the morning and 2 tablets in the evening Continue famotidine  20 mg twice a day Forst Xolair injections received today in the clinic. Continue Xolair 300 mg once every 4 weeks  Recurrent infection Keep track of infections, antibiotic use, and steroid use We will get lab work to recheck pneumococcal titers in about 4 weeks We will call you when the results become available  Chronic osteomyelitis Continue to follow up with your orthopaedic provider as recommended  Call the clinic if this treatment plan is not working well for you  Follow up in 1 month or sooner if needed.

## 2024-05-02 NOTE — Progress Notes (Signed)
 Immunotherapy   Patient Details  Name: [name] MRN: [id number] Date of Birth: July 05, 1970  05/02/2024  [name] started injections for  Xolair  Frequency: Every 4 Weeks Epi-Pen: Yes Consent signed and patient instructions given. Patient started Xolair today and received 300mg  sample in the [facility]. Patient waited 1 hour in office and did not experience any issues.   [name] 05/02/2024, 11:57 AM

## 2024-05-02 NOTE — Progress Notes (Signed)
 [address] [place] [address] Dept: [phone]  FOLLOW UP NOTE  Patient ID: [name], male    DOB: 01/23/1971  Age: 53 y.o. MRN: [id number] Date of Office Visit: 05/02/2024  Assessment  Chief Complaint: Urticaria and Pruritus  HPI [name] is a 53 year old male who presents for an urgent visit for hives. He was last seen in this clinic on 04/17/2024 by Wanda Craze, FNP, for evaluation of chronic urticaria, chronic lyme disease, recurrent osteomylitis, and recurrent infection.    His previous strep pneumonia 23 serotypes from March 12, 2024 were only protective to 3 out of the 23 serotypes and his diphtheria will not protective. He reports he had a pneumococcal vaccine and Tdap on 04/23/2024.   Discussed the use of AI scribe software for clinical note transcription with the patient, who gave verbal consent to proceed.  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  He experiences daily urticaria outbreaks primarily across his chest, left arm, and left armpit. These symptoms began after contracting [name] and Lyme disease, which progressed to disseminated Lyme disease. The rash frequently recurs in the left armpit, where the initial infection started.  He is currently taking high-dose antihistamines, including Allegra  three times a day and Pepcid  AC twice a day. Missing a dose of Allegra  results in increased hives. He alternates between fexofenadine  and cetirizine about every 30 days.   He has a history of bacterial infections and recently received pneumococcal and TDAP vaccinations approximately a week ago. He is concerned about ongoing bone infections and mentions that his parents had dormant leukemia.  He experiences significant pain, exacerbated by cold weather, and manages it with  pain medication, taking two pills in the morning and another at lunchtime. His routine involves taking medication, resting, and repeating the cycle throughout the day.  His current medications are listed in the chart.    Drug Allergies:  Allergies  Allergen Reactions   Pregabalin  Hives, Shortness Of Breath and Other (See Comments)    **ATTEMPTING TO TAKE THIS MEDICATION AGAIN** Insomnia Mood alter   Sulfa Antibiotics Anaphylaxis and Other (See Comments)    Physical Exam: BP 100/80   Pulse 62   Temp 98.2 F (36.8 C)   SpO2 98%    Physical Exam Vitals reviewed.  Constitutional:      Appearance: Normal appearance.  HENT:     Head: Normocephalic and atraumatic.     Right Ear: Tympanic membrane normal.     Left Ear: Tympanic membrane normal.     Nose:     Comments: Bilateral nares slightly erythematous with thin clear nasal drainage noted. Pharynx normal. Ears normal. Eyes normal.    Mouth/Throat:     Pharynx: Oropharynx is clear.  Eyes:     Conjunctiva/sclera: Conjunctivae normal.  Cardiovascular:     Rate and Rhythm: Normal rate and regular rhythm.     Heart sounds: Normal heart sounds. No murmur heard. Pulmonary:     Effort: Pulmonary effort is normal.     Breath sounds: Normal breath sounds.     Comments: Lungs clear to auscultation Musculoskeletal:        General: Normal range of motion.     Cervical back: Normal range of motion and neck supple.  Skin:    General: Skin is warm and  dry.  Neurological:     Mental Status: He is alert and oriented to person, place, and time.  Psychiatric:        Mood and Affect: Mood normal.        Behavior: Behavior normal.        Thought Content: Thought content normal.        Judgment: Judgment normal.      Assessment and Plan: 1. Chronic idiopathic urticaria   2. Recurrent infections   3. Chronic recurrent multifocal osteomyelitis (HCC)     Meds ordered this encounter  Medications   EPINEPHrine  (EPIPEN  2-PAK) 0.3  mg/0.3 mL IJ SOAJ injection    Sig: Inject 0.3 mg into the muscle once for 1 dose.    Dispense:  1 each    Refill:  2    Patient Instructions  Chronic urticaria Continue either cetirizine 10 mg or fexofenadine  180 mg 2 tablets in the morning and 2 tablets in the evening Continue famotidine  20 mg twice a day Forst Xolair injections received today in the clinic. Continue Xolair 300 mg once every 4 weeks  Recurrent infection Keep track of infections, antibiotic use, and steroid use We will get lab work to recheck pneumococcal titers in about 4 weeks We will call you when the results become available  Chronic osteomyelitis Continue to follow up with your orthopaedic provider as recommended  Call the clinic if this treatment plan is not working well for you  Follow up in 1 month or sooner if needed.   Return in about 4 weeks (around 05/30/2024), or if symptoms worsen or fail to improve.    Thank you for the opportunity to care for this patient.  Please do not hesitate to contact me with questions.  Arlean Mutter, FNP [facility]      '

## 2024-05-03 DIAGNOSIS — M1812 Unilateral primary osteoarthritis of first carpometacarpal joint, left hand: Secondary | ICD-10-CM | POA: Diagnosis not present

## 2024-05-03 DIAGNOSIS — M79645 Pain in left finger(s): Secondary | ICD-10-CM | POA: Diagnosis not present

## 2024-05-06 DIAGNOSIS — F331 Major depressive disorder, recurrent, moderate: Secondary | ICD-10-CM | POA: Diagnosis not present

## 2024-05-14 DIAGNOSIS — M1812 Unilateral primary osteoarthritis of first carpometacarpal joint, left hand: Secondary | ICD-10-CM | POA: Diagnosis not present

## 2024-05-15 DIAGNOSIS — F3341 Major depressive disorder, recurrent, in partial remission: Secondary | ICD-10-CM | POA: Diagnosis not present

## 2024-05-16 ENCOUNTER — Ambulatory Visit

## 2024-05-16 ENCOUNTER — Other Ambulatory Visit

## 2024-05-16 VITALS — BP 134/75 | HR 74 | Ht 72.0 in | Wt 254.4 lb

## 2024-05-16 DIAGNOSIS — Z720 Tobacco use: Secondary | ICD-10-CM

## 2024-05-16 DIAGNOSIS — E7800 Pure hypercholesterolemia, unspecified: Secondary | ICD-10-CM

## 2024-05-16 DIAGNOSIS — R7989 Other specified abnormal findings of blood chemistry: Secondary | ICD-10-CM | POA: Diagnosis not present

## 2024-05-16 DIAGNOSIS — R5383 Other fatigue: Secondary | ICD-10-CM | POA: Diagnosis not present

## 2024-05-16 DIAGNOSIS — A6920 Lyme disease, unspecified: Secondary | ICD-10-CM

## 2024-05-16 DIAGNOSIS — I10 Essential (primary) hypertension: Secondary | ICD-10-CM

## 2024-05-16 DIAGNOSIS — R062 Wheezing: Secondary | ICD-10-CM | POA: Diagnosis not present

## 2024-05-16 MED ORDER — PREDNISONE 20 MG PO TABS: 40.0000 mg | ORAL_TABLET | Freq: Every day | ORAL | 0 refills | Status: DC

## 2024-05-16 NOTE — Progress Notes (Unsigned)
° ° °  SUBJECTIVE:   CHIEF COMPLAINT / HPI:  [name] is a 53yo M who presents to the clinic for general follow up. His only complaint today is a flare up of his upper thoracic pain, however he states this has been going on for over a year and his pain doctor is aware. He is awaiting appointment for a guided epidural procedure for his back and L SI joint pain. He continues to take daily oxycodone  and gabapentin  with some relief.  hronic urticaria, chronic lyme disease, recurrent osteomylitis, and recurrent infection.   previous strep pneumonia 23 serotypes from March 12, 2024 were only protective to 3 out of the 23 serotypes and his diphtheria will not protective. He reports he had a pneumococcal vaccine and Tdap on 04/23/2024.   high-dose antihistamines, including Allegra  three times a day and Pepcid  AC twice a day. Missing a dose of Allegra  results in increased hives. He alternates between fexofenadine  and cetirizine about every 30 days.   history of bacterial infections and recently received pneumococcal and TDAP vaccinations approximately a week ago. He is concerned about ongoing bone infections and mentions that his parents had dormant leukemia.   Sharp, stabbing,  Dr. Bonner pain doctor - guided epidural and L SI joint  Bowel regimen good, does it through diet with yogurt and probiotics, regularly goes daily  Makes his own cigarrettes would like to quit smoking   PERTINENT  PMH / PSH: disseminated Lyme disease, elevated migraines on Imitrex  and gabapentin , chronic urticaria, recurrent osteomyelitis, recurrent bacterial infections, anxiety/depression  Current meds: Tylenol  Epipen  Pepcid  20 Flonase  Gabapentin  600 TID Multivitamin/Probiotic  Narcan  Xolair  q28d Zofrran Oxycodone   Propanolol 80mg  Rosuvastatin  Zoloft  100mg  Imitrex  100mg   Triamcinoline Valsartan  40mg   OBJECTIVE:   There were no vitals taken for this visit.  ***  ASSESSMENT/PLAN:   Assessment & Plan Tobacco  abuse  Elevated LFTs  Essential hypertension  Hypercholesterolemia  Lyme disease      Garrett Dixons, DO [facility]

## 2024-05-17 LAB — CBC
Hematocrit: 43.4 % (ref 37.5–51.0)
Hemoglobin: 14.9 g/dL (ref 13.0–17.7)
MCH: 31.3 pg (ref 26.6–33.0)
MCHC: 34.3 g/dL (ref 31.5–35.7)
MCV: 91 fL (ref 79–97)
Platelets: 227 x10E3/uL (ref 150–450)
RBC: 4.76 x10E6/uL (ref 4.14–5.80)
RDW: 12.7 % (ref 11.6–15.4)
WBC: 7.4 x10E3/uL (ref 3.4–10.8)

## 2024-05-17 LAB — COMPREHENSIVE METABOLIC PANEL WITH GFR
ALT: 16 IU/L (ref 0–44)
AST: 22 IU/L (ref 0–40)
Albumin: 4.3 g/dL (ref 3.8–4.9)
Alkaline Phosphatase: 71 IU/L (ref 47–123)
BUN/Creatinine Ratio: 8 — ABNORMAL LOW (ref 9–20)
BUN: 8 mg/dL (ref 6–24)
Bilirubin Total: 0.3 mg/dL (ref 0.0–1.2)
CO2: 21 mmol/L (ref 20–29)
Calcium: 9.3 mg/dL (ref 8.7–10.2)
Chloride: 106 mmol/L (ref 96–106)
Creatinine, Ser: 0.95 mg/dL (ref 0.76–1.27)
Globulin, Total: 2.5 g/dL (ref 1.5–4.5)
Glucose: 106 mg/dL — ABNORMAL HIGH (ref 70–99)
Potassium: 4.4 mmol/L (ref 3.5–5.2)
Sodium: 143 mmol/L (ref 134–144)
Total Protein: 6.8 g/dL (ref 6.0–8.5)
eGFR: 96 mL/min/1.73

## 2024-05-17 LAB — RPR W/REFLEX TO TREPSURE: RPR: REACTIVE — AB

## 2024-05-17 LAB — RPR, QUANT: RPR, Quant: 1:1 {titer} — ABNORMAL HIGH

## 2024-05-17 NOTE — Assessment & Plan Note (Signed)
 Labs to be collected today.

## 2024-05-20 ENCOUNTER — Telehealth: Payer: Self-pay | Admitting: Pharmacist

## 2024-05-20 NOTE — Telephone Encounter (Signed)
 Attempted to contact patient for follow-up of referral for tobacco cessation - to schedule visit.   Left HIPAA compliant voice mail requesting call back to  Main number to reschedule: [phone]  Total time with patient call and documentation of interaction: 4 minutes.  Follow-up phone call planned: 10 days if patient has not yet scheduled.

## 2024-05-21 ENCOUNTER — Other Ambulatory Visit: Payer: Self-pay | Admitting: *Deleted

## 2024-05-21 ENCOUNTER — Telehealth: Payer: Self-pay

## 2024-05-21 ENCOUNTER — Other Ambulatory Visit (HOSPITAL_COMMUNITY): Payer: Self-pay

## 2024-05-21 DIAGNOSIS — R7989 Other specified abnormal findings of blood chemistry: Secondary | ICD-10-CM

## 2024-05-21 NOTE — Telephone Encounter (Signed)
 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   He is asking if T pallidium antibodies were drawn.   Spoke with [facility]. Updated order is needed for testing.   He placed orders per Dr. Lupie.   Called [facility] and provided with update. He is asking that we call him back at [phone] with final results.   Chiquita JAYSON English, RN

## 2024-05-22 ENCOUNTER — Other Ambulatory Visit: Payer: Self-pay

## 2024-05-22 ENCOUNTER — Other Ambulatory Visit (HOSPITAL_COMMUNITY): Payer: Self-pay

## 2024-05-22 DIAGNOSIS — L501 Idiopathic urticaria: Secondary | ICD-10-CM

## 2024-05-22 MED ORDER — OMALIZUMAB 300 MG/2  ML ~~LOC~~ SOSY
300.0000 mg | PREFILLED_SYRINGE | SUBCUTANEOUS | 10 refills | Status: AC
  Filled 2024-05-22 – 2024-05-28 (×3): qty 2, 28d supply, fill #0
  Filled 2024-07-01: qty 2, 28d supply, fill #1

## 2024-05-24 ENCOUNTER — Telehealth: Admitting: Family Medicine

## 2024-05-24 ENCOUNTER — Other Ambulatory Visit: Payer: Self-pay

## 2024-05-24 DIAGNOSIS — R051 Acute cough: Secondary | ICD-10-CM | POA: Diagnosis not present

## 2024-05-24 MED ORDER — PROMETHAZINE-DM 6.25-15 MG/5ML PO SYRP: 5.0000 mL | ORAL_SOLUTION | Freq: Four times a day (QID) | ORAL | 0 refills | Status: AC | PRN

## 2024-05-24 NOTE — Progress Notes (Signed)
 " Virtual Visit Consent   [name], you are scheduled for a virtual visit with a [facility] provider today. Just as with appointments in the office, your consent must be obtained to participate. Your consent will be active for this visit and any virtual visit you may have with one of our providers in the next 365 days. If you have a MyChart account, a copy of this consent can be sent to you electronically.  As this is a virtual visit, video technology does not allow for your provider to perform a traditional examination. This may limit your provider's ability to fully assess your condition. If your provider identifies any concerns that need to be evaluated in person or the need to arrange testing (such as labs, EKG, etc.), we will make arrangements to do so. Although advances in technology are sophisticated, we cannot ensure that it will always work on either your end or our end. If the connection with a video visit is poor, the visit may have to be switched to a telephone visit. With either a video or telephone visit, we are not always able to ensure that we have a secure connection.  By engaging in this virtual visit, you consent to the provision of healthcare and authorize for your insurance to be billed (if applicable) for the services provided during this visit. Depending on your insurance coverage, you may receive a charge related to this service.  I need to obtain your verbal consent now. Are you willing to proceed with your visit today? [name] has provided verbal consent on 05/24/2024 for a virtual visit (video or telephone). [name], [place]  Date: 05/24/2024 6:00 PM   Virtual Visit via Video Note   IRoosvelt Chen, connected with  [name]  ([id number], 1971-03-21) on 05/24/2024 at  6:00 PM EST by a video-enabled telemedicine application and verified that I am speaking with the correct person using two identifiers.  Location: Patient: Virtual Visit  Location Patient: Home Provider: Virtual Visit Location Provider: Home Office   I discussed the limitations of evaluation and management by telemedicine and the availability of in person appointments. The patient expressed understanding and agreed to proceed.    History of Present Illness: [name] is a 53 y.o. who identifies as a male who was assigned male at birth, and is being seen today for c/o having a real bad cough and left side of throat is painful.  Pt states taking Tylenol .  Pt states he has chills but no fever. Pt states symptoms started three days ago. Pt states unable to sleep because of the cough. Pt states taking Nasocort for nasal congestion. Pt states taking night time theraflu as well. Pt states clear phlegm when he coughs. Pt states currently taking steroids prescribed by his PCP and last dose is tomorrow.  Pt states he is following up with PCP on December 30th.   HPI: HPI  Problems:  Patient Active Problem List   Diagnosis Date Noted   Chronic recurrent multifocal osteomyelitis (HCC) 05/02/2024   Recurrent infections 05/02/2024   Chronic idiopathic urticaria 05/02/2024   Facial weakness 08/22/2023   Chronic migraine w/o aura w/o status migrainosus, not intractable 08/22/2023   Rash 06/23/2023   Insomnia 03/23/2023   Bilateral carpal tunnel syndrome 02/03/2023   Sprain of anterior talofibular ligament of left ankle 09/06/2022   Chronic pain syndrome 08/18/2022   Cervico-occipital neuralgia 01/27/2022   Lateral epicondylitis, right elbow 09/10/2021  Depressed mood 05/08/2021   Pain in both hands 05/08/2021   Pain of right thumb 04/12/2021   Other spondylosis with myelopathy, cervical region 02/02/2021   Low back pain 01/19/2021   Neck pain on left side 09/30/2020   Gait abnormality 09/30/2020   Left-sided weakness 09/01/2020   History of Lyme disease 09/01/2020   Unintentional weight loss 08/18/2020   Chronic constipation 08/18/2020   History of  pancreatitis 08/18/2020   Family history of colon cancer 08/18/2020   Cervical radiculopathy 05/14/2020   Essential hypertension 01/03/2020   Vitamin D  deficiency 11/26/2019   Hyperglycemia 11/26/2019   Tobacco use 10/11/2019   Hyperlipemia    Cervical spondylosis 08/15/2019   Fatty liver 05/06/2019   Diverticulosis 05/06/2019   S/P lumbar fusion 11/15/2017   Lumbar post-laminectomy syndrome 06/12/2017   Labral tear of shoulder, degenerative, left 02/21/2017   Pain in joint of left shoulder 01/20/2017   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  Resting comfortably  at home.  Head is normocephalic, atraumatic.  No labored breathing.  Speech is clear and coherent with logical content.  Patient is alert and oriented at baseline.    Assessment and Plan: 1. Acute cough (Primary) - promethazine -dextromethorphan (PROMETHAZINE -DM) 6.25-15 MG/5ML syrup; Take 5 mLs by mouth 4 (four) times daily as needed for up to 6 days for cough.  Dispense: 118 mL; Refill: 0  -Pt advised to follow up with PCP as recommended -Pt to follow up in person urgent care sooner for worsening symptoms.  Follow Up Instructions: I discussed the assessment and treatment plan with the patient. The patient was provided an opportunity to ask questions and all were answered. The patient agreed with the plan and demonstrated an understanding of the instructions.  A copy of instructions were sent to the patient via MyChart unless otherwise noted below.     The patient was advised to call back or seek an in-person evaluation if the symptoms worsen or if the condition fails to improve as anticipated.    Garrett Mater, PA-C    [1]  Allergies Allergen Reactions   Pregabalin  Hives, Shortness Of Breath and Other (See  Comments)    **ATTEMPTING TO TAKE THIS MEDICATION AGAIN** Insomnia Mood alter   Sulfa Antibiotics Anaphylaxis and Other (See Comments)  [2]  Current Outpatient Medications:    promethazine -dextromethorphan (PROMETHAZINE -DM) 6.25-15 MG/5ML syrup, Take 5 mLs by mouth 4 (four) times daily as needed for up to 6 days for cough., Disp: 118 mL, Rfl: 0   acetaminophen  (TYLENOL ) 500 MG tablet, as needed., Disp: , Rfl:    EPINEPHrine  (EPIPEN  2-PAK) 0.3 mg/0.3 mL IJ SOAJ injection, Inject 0.3 mg into the muscle as needed for anaphylaxis., Disp: 0.3 mL, Rfl: 1   famotidine  (PEPCID ) 20 MG tablet, TAKE 1 TABLET(20 MG) BY MOUTH TWICE DAILY, Disp: 180 tablet, Rfl: 1   fluticasone  (FLONASE ) 50 MCG/ACT nasal spray, SHAKE LIQUID AND USE 2 SPRAYS IN EACH NOSTRIL DAILY STRENGTH: 50 MCG/ACT, Disp: 48 mL, Rfl: 2   gabapentin  (NEURONTIN ) 300 MG capsule, Take 2 capsules (600 mg total) by mouth 3 (three) times daily., Disp: 180 capsule, Rfl: 11   Multiple Vitamins-Minerals (MENS MULTIVITAMIN) CHEW, Chew 1 tablet by mouth daily., Disp: , Rfl:    naloxone (NARCAN) nasal spray 4 mg/0.1 mL, 1 spray in one nostril x1.  may repeat dose q 3 min until pt  awakens or EMS arrives, Disp: , Rfl:    omalizumab  (XOLAIR ) 300 MG/2  ML prefilled syringe, Inject 300 mg into the skin every 28 (twenty-eight) days., Disp: 2 mL, Rfl: 10   ondansetron  (ZOFRAN -ODT) 4 MG disintegrating tablet, Take 1 tablet (4 mg total) by mouth every 8 (eight) hours as needed for nausea or vomiting., Disp: 20 tablet, Rfl: 6   Oxycodone  HCl 10 MG TABS, Take 10 mg by mouth 3 (three) times daily., Disp: , Rfl:    predniSONE  (DELTASONE ) 20 MG tablet, Take 2 tablets (40 mg total) by mouth daily with breakfast., Disp: 10 tablet, Rfl: 0   propranolol  ER (INDERAL  LA) 80 MG 24 hr capsule, Take 1 capsule (80 mg total) by mouth daily., Disp: 90 capsule, Rfl: 2   rosuvastatin  (CRESTOR ) 20 MG tablet, TAKE 1 TABLET(20 MG) BY MOUTH DAILY STRENGTH: 20 MG, Disp: 90 tablet, Rfl:  3   sertraline  (ZOLOFT ) 100 MG tablet, TAKE 1 TABLET DAILY, Disp: 90 tablet, Rfl: 1   sertraline  (ZOLOFT ) 50 MG tablet, Take 50 mg by mouth daily., Disp: , Rfl:    SUMAtriptan  (IMITREX ) 100 MG tablet, Take 1 tablet (100 mg total) by mouth every 2 (two) hours as needed for migraine. May repeat in 2 hours if headache persists or recurs., Disp: 10 tablet, Rfl: 12   triamcinolone  ointment (KENALOG ) 0.1 %, Apply 1 Application topically 2 (two) times daily., Disp: 60 g, Rfl: 0   valsartan  (DIOVAN ) 40 MG tablet, TAKE 1 TABLET BY MOUTH EVERY DAY, Disp: 90 tablet, Rfl: 2  "

## 2024-05-24 NOTE — Patient Instructions (Signed)
 " [name], thank you for joining Roosvelt Mater, PA-C for today's virtual visit.  While this provider is not your primary care provider (PCP), if your PCP is located in our provider database this encounter information will be shared with them immediately following your visit.   A [facility] MyChart account gives you access to today's visit and all your visits, tests, and labs performed at [facility]  click here if you don't have a [facility] MyChart account or go to [email or web address]  Consent: (Patient) [name] provided verbal consent for this virtual visit at the beginning of the encounter.  Current Medications:  Current Outpatient Medications:    promethazine -dextromethorphan (PROMETHAZINE -DM) 6.25-15 MG/5ML syrup, Take 5 mLs by mouth 4 (four) times daily as needed for up to 6 days for cough., Disp: 118 mL, Rfl: 0   acetaminophen  (TYLENOL ) 500 MG tablet, as needed., Disp: , Rfl:    EPINEPHrine  (EPIPEN  2-PAK) 0.3 mg/0.3 mL IJ SOAJ injection, Inject 0.3 mg into the muscle as needed for anaphylaxis., Disp: 0.3 mL, Rfl: 1   famotidine  (PEPCID ) 20 MG tablet, TAKE 1 TABLET(20 MG) BY MOUTH TWICE DAILY, Disp: 180 tablet, Rfl: 1   fluticasone  (FLONASE ) 50 MCG/ACT nasal spray, SHAKE LIQUID AND USE 2 SPRAYS IN EACH NOSTRIL DAILY STRENGTH: 50 MCG/ACT, Disp: 48 mL, Rfl: 2   gabapentin  (NEURONTIN ) 300 MG capsule, Take 2 capsules (600 mg total) by mouth 3 (three) times daily., Disp: 180 capsule, Rfl: 11   Multiple Vitamins-Minerals (MENS MULTIVITAMIN) CHEW, Chew 1 tablet by mouth daily., Disp: , Rfl:    naloxone (NARCAN) nasal spray 4 mg/0.1 mL, 1 spray in one nostril x1.  may repeat dose q 3 min until pt awakens or EMS arrives, Disp: , Rfl:    omalizumab  (XOLAIR ) 300 MG/2  ML prefilled syringe, Inject 300 mg into the skin every 28 (twenty-eight) days., Disp: 2 mL, Rfl: 10   ondansetron  (ZOFRAN -ODT) 4 MG disintegrating tablet, Take 1 tablet (4 mg total) by mouth  every 8 (eight) hours as needed for nausea or vomiting., Disp: 20 tablet, Rfl: 6   Oxycodone  HCl 10 MG TABS, Take 10 mg by mouth 3 (three) times daily., Disp: , Rfl:    predniSONE  (DELTASONE ) 20 MG tablet, Take 2 tablets (40 mg total) by mouth daily with breakfast., Disp: 10 tablet, Rfl: 0   propranolol  ER (INDERAL  LA) 80 MG 24 hr capsule, Take 1 capsule (80 mg total) by mouth daily., Disp: 90 capsule, Rfl: 2   rosuvastatin  (CRESTOR ) 20 MG tablet, TAKE 1 TABLET(20 MG) BY MOUTH DAILY STRENGTH: 20 MG, Disp: 90 tablet, Rfl: 3   sertraline  (ZOLOFT ) 100 MG tablet, TAKE 1 TABLET DAILY, Disp: 90 tablet, Rfl: 1   sertraline  (ZOLOFT ) 50 MG tablet, Take 50 mg by mouth daily., Disp: , Rfl:    SUMAtriptan  (IMITREX ) 100 MG tablet, Take 1 tablet (100 mg total) by mouth every 2 (two) hours as needed for migraine. May repeat in 2 hours if headache persists or recurs., Disp: 10 tablet, Rfl: 12   triamcinolone  ointment (KENALOG ) 0.1 %, Apply 1 Application topically 2 (two) times daily., Disp: 60 g, Rfl: 0   valsartan  (DIOVAN ) 40 MG tablet, TAKE 1 TABLET BY MOUTH EVERY DAY, Disp: 90 tablet, Rfl: 2   Medications ordered in this encounter:  Meds ordered this encounter  Medications   promethazine -dextromethorphan (PROMETHAZINE -DM) 6.25-15 MG/5ML syrup    Sig: Take 5 mLs by mouth 4 (four) times daily as needed for up to  6 days for cough.    Dispense:  118 mL    Refill:  0     *If you need refills on other medications prior to your next appointment, please contact your pharmacy*  Follow-Up: Call back or seek an in-person evaluation if the symptoms worsen or if the condition fails to improve as anticipated.  [facility] [phone]  Other Instructions Cough, Adult Coughing is a reflex that clears your throat and airways (respiratory system). It helps heal and protect your lungs. It is normal to cough from time to time. A cough that happens with other symptoms or that lasts a long time may be a  sign of a condition that needs treatment. A short-term (acute) cough may only last 2-3 weeks. A long-term (chronic) cough may last 8 or more weeks. Coughing is often caused by: Diseases, such as: An infection of the respiratory system. Asthma or other heart or lung diseases. Gastroesophageal reflux. This is when acid comes back up from the stomach. Breathing in things that irritate your lungs. Allergies. Postnasal drip. This is when mucus runs down the back of your throat. Smoking. Some medicines. Follow these instructions at home: Medicines Take over-the-counter and prescription medicines only as told by your health care provider. Talk with your provider before you take cough medicine (cough suppressants). Eating and drinking Do not drink alcohol. Avoid caffeine. Drink enough fluid to keep your pee (urine) pale yellow. Lifestyle Avoid cigarette smoke. Do not use any products that contain nicotine or tobacco. These products include cigarettes, chewing tobacco, and vaping devices, such as e-cigarettes. If you need help quitting, ask your provider. Avoid things that make you cough. These may include perfumes, candles, cleaning products, or campfire smoke. General instructions  Watch for any changes to your cough. Tell your provider about them. Always cover your mouth when you cough. If the air is dry in your bedroom or home, use a cool mist vaporizer or humidifier. If your cough is worse at night, try to sleep in a semi-upright position. Rest as needed. Contact a health care provider if: You have new symptoms, or your symptoms get worse. You cough up pus. You have a fever that does not go away or a cough that does not get better after 2-3 weeks. You cannot control your cough with medicine, and you are losing sleep. You have pain that gets worse or is not helped with medicine. You lose weight for no clear reason. You have night sweats. Get help right away if: You cough up  blood. You have trouble breathing. Your heart is beating very fast. These symptoms may be an emergency. Get help right away. Call 911. Do not wait to see if the symptoms will go away. Do not drive yourself to the hospital. This information is not intended to replace advice given to you by your health care provider. Make sure you discuss any questions you have with your health care provider. Document Revised: 01/14/2022 Document Reviewed: 01/14/2022 Elsevier Patient Education  2024 Elsevier Inc.   If you have been instructed to have an in-person evaluation today at a local Urgent Care facility, please use the link below. It will take you to a list of all of our available [facility], including address, phone number and hours of operation. Please do not delay care.   Urgent Cares  If you or a family member do not have a primary care provider, use the link below to schedule a visit  and establish care. When you choose a [facility] primary care physician or advanced practice provider, you gain a long-term partner in health. Find a Primary Care Provider  Learn more about [facility]'s in-office and virtual care options: [facility] - Get Care Now  "

## 2024-05-28 ENCOUNTER — Other Ambulatory Visit: Payer: Self-pay

## 2024-05-28 ENCOUNTER — Encounter: Payer: Self-pay | Admitting: Pharmacist

## 2024-05-28 ENCOUNTER — Ambulatory Visit: Admitting: Pharmacist

## 2024-05-28 VITALS — BP 119/71 | HR 64 | Wt 253.6 lb

## 2024-05-28 DIAGNOSIS — Z720 Tobacco use: Secondary | ICD-10-CM

## 2024-05-28 MED ORDER — VARENICLINE TARTRATE 0.5 MG PO TABS: 0.5000 mg | ORAL_TABLET | Freq: Two times a day (BID) | ORAL | 3 refills | Status: AC

## 2024-05-28 NOTE — Patient Instructions (Signed)
 Nice to see you today!  Medication Changes: - START Varenicline 0.5mg  once daily for 1 week THEN increase to twice daily.  Always with food!  - Continue all other medication the same.  Tobacco Patient Instructions  Quitting smoking is one of the most important decisions you can make for your current and future health. Consider what you dislike about smoking and how quitting could personally benefit you. Try to cut down.   Aim for reducing the amount you smoke by 50% over the next 2 weeks.  My target quit date is: January or latest February  Starting today, Be a Quitter!  Remind yourself why you want to quit.  Delay your first cigarette of the day for as long as possible.  Start cleaning out all pockets, drawers, and your car of cigarettes.  Getting Through the Cravings Once You Are Smoke Free: Each craving will last about 10 minutes, whether or not you smoke. Here's how to get through the cravings without cigarettes:  DELAY: Tell yourself that you'll wait for the next craving. Do it every time! DEEP BREATHS: One reason smoking feels good is because you breathe in deeply to inhale. Take four slow, deep breaths and feel the relaxation without the hamful effects of cigarettes. DRINK WATER: Drink a glass of cool water. It will give your hands and mouth something to do and will help flush the nicotine out of your system faster. DIVERT: Do something else -- brush your teeth, take a walk, call a friend who can offer you support. Just moving onto something other than thinking about cigarettes will move you through the craving.   Frequently Asked Questions  What can I do when I get the urge to smoke? To get through the urge to smoke, try the following:  Review your reasons for quitting and think of all the benefits to your health, your finances, and your family.  Remind yourself that there is no such thing as just one cigarette -- or even one puff.  Ride out the desire to smoke. Use the 4  Os -- Delay, Deep Breaths, Drink Water and Divert to get you through. The craving will go away eventually. Do not fool yourself into thinking you can have just one cigarette.  Any tips on how to deal with stress? Stress is a natural part of life. The key is to deal with it without reaching for a cigarette. Taking deep breaths, counting backwards from 10 and asking yourself 1-how big a deal is this?  Writing down your feelings, talking with a friend and doing things like positive self-talk and meditation are some other ways that people deal with daily stress.  What if I start smoking again? Slips happen. Most people try to quit smoking a few times before they are successful. Don't beat yourself up if this happens to you! Ask yourself if this was a slip or a relapse. A slip is a one-time mistake that is quickly corrected. A relapse is going back to your old smoking habits.   If you slip, don't give up. Think of it as a learning experience. Ask yourself what went wrong and renew your commitment to staying away from smoking for good.  If you relapse, try not to get discouraged. Ask yourself the question What caused me to start smoking? Figure out what helped you and what didn't when you tried to quit. Knowing why you relapsed is useful information for your next attempt to quit.  Please bring all medications to your  clinic visits.  Please arrive 10-15 minutes prior to your scheduled visit time.

## 2024-05-28 NOTE — Progress Notes (Signed)
 Specialty Pharmacy Refill Coordination Note  [name] is a 53 y.o. male assessed today regarding refills of clinic administered specialty medication(s) Omalizumab  (XOLAIR )   Clinic requested Courier to Provider Office   Delivery date: 06/05/24   Verified address: [facility] - [address], [place] [address]   Medication will be filled on: 06/04/24

## 2024-05-28 NOTE — Progress Notes (Signed)
" ° °  S:   Chief Complaint  Patient presents with   Medication Management    Tobacco use disorder   53 y.o. male who presents for evaluation/assistance with tobacco dependence. Patient is in good spirits.    Patient was referred and last seen by Primary Care Provider, Dr. Lupie, on 05/16/2024.  At last visit, tobacco cessation was prioritized for health goal and visit was scheduled.    PMH is significant for Lyme Disease, Chronic Pain.   Age when started using tobacco on a daily basis 15. Brand smoked smokes - hand rolled, smokes ~ 10 per day.  Estimated nicotine content per cigarette (mg) 1  Estimated nicotine intake per day 10-12 mg.      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 Rates CONFIDENCE of quitting tobacco on 1-10 scale of 10 - I need to quit.  Most common triggers to use tobacco include; meals, truck, stress   Motivation to quit: health  O:  Review of Systems  Musculoskeletal:  Positive for back pain, joint pain and neck pain.  Neurological:  Positive for sensory change.  All other systems reviewed and are negative.  Physical Exam Vitals reviewed.  Constitutional:      Appearance: Normal appearance.  Pulmonary:     Effort: Pulmonary effort is normal.  Neurological:     Mental Status: He is alert.  Psychiatric:        Mood and Affect: Mood normal.        Behavior: Behavior normal.        Thought Content: Thought content normal.        Judgment: Judgment normal.    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    -Initiated varenicline 0.5 mg by mouth once daily with food x7 days, then 0.5 mg by mouth twice daily with food thereafter. Patient  counseled on purpose, proper use, and potential adverse effects, including GI upset. -Provided information on 1 800-QUIT NOW support program.    Written patient instructions provided. Patient verbalized understanding of treatment plan.  Total time in face to face counseling 31 minutes.    Follow-up:  Pharmacist 1 month   "

## 2024-05-28 NOTE — Assessment & Plan Note (Signed)
 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    -Initiated varenicline 0.5 mg by mouth once daily with food x7 days, then 0.5 mg by mouth twice daily with food thereafter. Patient counseled on purpose, proper use, and potential adverse effects, including GI upset. -Provided information on 1 800-QUIT NOW support program.

## 2024-05-29 ENCOUNTER — Telehealth: Payer: Self-pay

## 2024-05-29 NOTE — Telephone Encounter (Signed)
 [name] with [facility] calls nurse to follow-up on confirmatory RPR test.   I spoke with Lamar. He reports the test was added on, however it does not appear this was successful.   Patient will need to come back in for recollect.   Will forward to PCP to place future order.

## 2024-05-30 ENCOUNTER — Other Ambulatory Visit: Payer: Self-pay

## 2024-05-30 DIAGNOSIS — Z113 Encounter for screening for infections with a predominantly sexual mode of transmission: Secondary | ICD-10-CM

## 2024-06-03 ENCOUNTER — Encounter: Payer: Self-pay | Admitting: Student

## 2024-06-03 ENCOUNTER — Ambulatory Visit (INDEPENDENT_AMBULATORY_CARE_PROVIDER_SITE_OTHER): Admitting: Student

## 2024-06-03 VITALS — BP 115/73 | HR 74 | Temp 98.9°F | Wt 248.0 lb

## 2024-06-03 DIAGNOSIS — A530 Latent syphilis, unspecified as early or late: Secondary | ICD-10-CM

## 2024-06-03 DIAGNOSIS — R062 Wheezing: Secondary | ICD-10-CM

## 2024-06-03 LAB — POC SOFIA 2 FLU + SARS ANTIGEN FIA
Influenza A, POC: NEGATIVE
Influenza B, POC: NEGATIVE
SARS Coronavirus 2 Ag: NEGATIVE

## 2024-06-03 MED ORDER — AZITHROMYCIN 500 MG PO TABS: 500.0000 mg | ORAL_TABLET | Freq: Every day | ORAL | 0 refills | Status: AC

## 2024-06-03 MED ORDER — ALBUTEROL SULFATE HFA 108 (90 BASE) MCG/ACT IN AERS: 2.0000 | INHALATION_SPRAY | Freq: Four times a day (QID) | RESPIRATORY_TRACT | 2 refills | Status: AC | PRN

## 2024-06-03 MED ORDER — PREDNISONE 20 MG PO TABS: 40.0000 mg | ORAL_TABLET | Freq: Every day | ORAL | 0 refills | Status: AC

## 2024-06-03 MED ORDER — IPRATROPIUM-ALBUTEROL 0.5-2.5 (3) MG/3ML IN SOLN
3.0000 mL | Freq: Once | RESPIRATORY_TRACT | Status: AC
  Administered 2024-06-03: 3 mL via RESPIRATORY_TRACT

## 2024-06-03 MED ORDER — ALBUTEROL SULFATE (2.5 MG/3ML) 0.083% IN NEBU
2.5000 mg | INHALATION_SOLUTION | Freq: Once | RESPIRATORY_TRACT | Status: AC
  Administered 2024-06-03: 2.5 mg via RESPIRATORY_TRACT

## 2024-06-03 NOTE — Patient Instructions (Signed)
 It was great to see you! Thank you for allowing me to participate in your care!   I recommend that you always bring your medications to each appointment as this makes it easy to ensure we are on the correct medications and helps us  not miss when refills are needed.  Our plans for today:  - Take 500 mg (1 tablet) of azithromycin  once daily for 3 days. - Take 2 tablets (40 mg) of prednisone  every morning with breakfast for 5 days - Use albuterol  inhaler 2 to 4 puffs every 4 hours as needed for shortness of breath - If you develop worsening shortness of breath, inability to breathe/talk or ambulate or develop severe chest pain please go to the emergency room   Take care and seek immediate care sooner if you develop any concerns. Please remember to show up 15 minutes before your scheduled appointment time!  Gladis Church, DO [facility]

## 2024-06-03 NOTE — Progress Notes (Signed)
"   [id number]    SUBJECTIVE:   CHIEF COMPLAINT / HPI:   Wheezing/cough 54 year old male with ongoing cough.  Suspected COPD.  On Chantix  for smoking cessation.  Currently smoking 3 to 5 cigarettes a day.  5 days of cough, wheezing, shortness of breath. No fevers.  No other systemic symptoms.  OBJECTIVE:   There were no vitals taken for this visit.   General: NAD, pleasant HEENT: Normocephalic, atraumatic head. Normal external ear, canal, TM bilaterally. EOM intact and normal conjunctiva BL. Normal external nose. Throat not erythematous, no exudate, no deviation. Normal dentition.  Cardio: RRR, no MRG. Cap Refill <2s. Respiratory: Diffuse bilateral wheezing, prolonged expiratory phase Skin: Warm and dry  ASSESSMENT/PLAN:   Assessment & Plan Wheezing - Differential: COPD exacerbation, viral bronchitis - Improved with DuoNeb in office - Will prescribe albuterol  inhaler - Prednisone  5-day course, azithromycin  3-day course - Recommend PFTs when acute illness subsides - Follow-up if symptoms worsen or fail to improve - Consider CXR if not improving Positive RPR test   Gladis Church, DO [facility] "

## 2024-06-03 NOTE — Progress Notes (Signed)
 Reviewed and agree with Dr Rennis plan.

## 2024-06-04 ENCOUNTER — Other Ambulatory Visit: Payer: Self-pay

## 2024-06-04 ENCOUNTER — Other Ambulatory Visit: Payer: Self-pay | Admitting: *Deleted

## 2024-06-05 ENCOUNTER — Ambulatory Visit: Payer: Self-pay | Admitting: Student

## 2024-06-05 LAB — T.PALLIDUM AB, TOTAL: Treponema pallidum Antibodies: NONREACTIVE

## 2024-06-06 MED ORDER — SERTRALINE HCL 100 MG PO TABS: 100.0000 mg | ORAL_TABLET | Freq: Every day | ORAL | 1 refills | Status: AC

## 2024-06-06 NOTE — Telephone Encounter (Signed)
 Patient calls nurse line voicing frustration over Zoloft  refill.   He reports he takes 100mg .   Will forward to PCP.

## 2024-06-12 ENCOUNTER — Ambulatory Visit

## 2024-06-12 DIAGNOSIS — L501 Idiopathic urticaria: Secondary | ICD-10-CM | POA: Diagnosis not present

## 2024-06-12 MED ORDER — OMALIZUMAB 300 MG/2  ML ~~LOC~~ SOSY
300.0000 mg | PREFILLED_SYRINGE | Freq: Once | SUBCUTANEOUS | Status: AC
  Administered 2024-06-12: 300 mg via SUBCUTANEOUS

## 2024-06-20 ENCOUNTER — Ambulatory Visit: Payer: Self-pay

## 2024-06-20 VITALS — BP 133/85 | HR 76 | Ht 72.0 in | Wt 246.6 lb

## 2024-06-20 DIAGNOSIS — R062 Wheezing: Secondary | ICD-10-CM

## 2024-06-20 NOTE — Patient Instructions (Signed)
 It was wonderful to see you today.  Please bring ALL of your medications with you to every visit.   Today we talked about:  Your wheezing. I am so glad to hear you are feeling so much better than the last visit! Continue your daily albuterol  inhaler for now. We will try to get a pulmonary function test at your next visit with Dr. Koval on 1/27 to confirm a diagnosis of COPD and start you on a daily inhaler called Symbicort.   Thank you for choosing [facility].   Please call [phone] with any questions about today's appointment.  Please arrive at least 15 minutes prior to your scheduled appointments.   If you had blood work today, I will send you a MyChart message or a letter if results are normal. Otherwise, I will give you a call.   If you had a referral placed, they will call you to set up an appointment. Please give us  a call if you don't hear back in the next 2 weeks.   If you need additional refills before your next appointment, please call your pharmacy first.   You should follow up in our clinic on 01/27.  [name], DO Family Medicine

## 2024-06-20 NOTE — Progress Notes (Signed)
" ° ° °  SUBJECTIVE:   CHIEF COMPLAINT / HPI:   Follow-up for sick visit 01/05 -Patient seen on 1/5 for persistent cough, wheezing, shortness of breath.  With history of smoking, presumed COPD exacerbation.  He was treated with DuoNebs in office, 5-day course of prednisone , 3-day course of azithromycin , and albuterol  inhaler.  States he is feeling much better now than he did at last visit. -Using albuterol  inhaler daily as appropriate but still wheezing some -Down to 4 cigarettes a day, following up with Dr. Koval as appropriate and taking his Chantix  -No recent fever, productive cough, DOE  OBJECTIVE:   BP 133/85   Pulse 76   Ht 6' (1.829 m)   Wt 246 lb 9.6 oz (111.9 kg)   SpO2 98%   BMI 33.44 kg/m   General: A&O, NAD HEENT: No sign of trauma, EOM grossly intact Cardiac: RRR, no m/r/g Respiratory: normal WOB, scattered expiratory wheezes throughout all lung fields, good air movement throughout all lung fields with good respiratory effort, patient speaks to me in full clear sentences without difficulty breathing, dry cough on exam GI: Non-distended  Neuro: Moves all four extremities appropriately. Psych: Appropriate mood and affect   ASSESSMENT/PLAN:   Assessment & Plan Wheezing Most likely in the setting of COPD exacerbation that is gradually resolving.  I would like to get confirmation of COPD diagnosis with in-house PFT by Dr. Koval.  Patient has 01/27 appointment coming up with Dr. Koval for smoking cessation, will reach out to Dr. Koval to ask if a PFT can be done at this appointment as well. -PFT with Dr. Ruthann -Consider starting Symbicort daily  -See me as needed   [name], DO [facility] "

## 2024-06-21 ENCOUNTER — Other Ambulatory Visit: Payer: Self-pay

## 2024-06-25 ENCOUNTER — Ambulatory Visit: Admitting: Pharmacist

## 2024-07-01 ENCOUNTER — Other Ambulatory Visit (HOSPITAL_COMMUNITY): Payer: Self-pay

## 2024-07-01 ENCOUNTER — Other Ambulatory Visit: Payer: Self-pay

## 2024-07-01 NOTE — Progress Notes (Signed)
 Specialty Pharmacy Refill Coordination Note  [name] is a 54 y.o. male assessed today regarding refills of clinic administered specialty medication(s) Omalizumab  (XOLAIR )   Clinic requested Courier to Provider Office   Delivery date: 07/04/24   Verified address: [facility] - [address], [place] [address]   Medication will be filled on: 07/03/24

## 2024-07-03 ENCOUNTER — Other Ambulatory Visit: Payer: Self-pay

## 2024-07-10 ENCOUNTER — Ambulatory Visit

## 2024-07-11 ENCOUNTER — Ambulatory Visit: Admitting: Pharmacist
